# Patient Record
Sex: Female | Born: 1960 | Race: White | Hispanic: No | Marital: Single | Smoking: Former smoker
Health system: Southern US, Community
[De-identification: ages and names within clinical notes are randomized; demographics above are authoritative.]

## PROBLEM LIST (undated history)

## (undated) DIAGNOSIS — E872 Acidosis, unspecified: Secondary | ICD-10-CM

## (undated) DIAGNOSIS — G9340 Encephalopathy, unspecified: Secondary | ICD-10-CM

## (undated) DIAGNOSIS — J449 Chronic obstructive pulmonary disease, unspecified: Secondary | ICD-10-CM

## (undated) DIAGNOSIS — F329 Major depressive disorder, single episode, unspecified: Secondary | ICD-10-CM

## (undated) DIAGNOSIS — D701 Agranulocytosis secondary to cancer chemotherapy: Secondary | ICD-10-CM

## (undated) DIAGNOSIS — F1410 Cocaine abuse, uncomplicated: Secondary | ICD-10-CM

## (undated) DIAGNOSIS — F1010 Alcohol abuse, uncomplicated: Secondary | ICD-10-CM

## (undated) DIAGNOSIS — R0603 Acute respiratory distress: Secondary | ICD-10-CM

## (undated) DIAGNOSIS — D649 Anemia, unspecified: Secondary | ICD-10-CM

## (undated) DIAGNOSIS — L0291 Cutaneous abscess, unspecified: Secondary | ICD-10-CM

## (undated) DIAGNOSIS — I959 Hypotension, unspecified: Secondary | ICD-10-CM

## (undated) DIAGNOSIS — G8929 Other chronic pain: Secondary | ICD-10-CM

## (undated) DIAGNOSIS — C3490 Malignant neoplasm of unspecified part of unspecified bronchus or lung: Secondary | ICD-10-CM

## (undated) DIAGNOSIS — T451X5A Adverse effect of antineoplastic and immunosuppressive drugs, initial encounter: Secondary | ICD-10-CM

## (undated) DIAGNOSIS — F319 Bipolar disorder, unspecified: Secondary | ICD-10-CM

## (undated) DIAGNOSIS — N289 Disorder of kidney and ureter, unspecified: Secondary | ICD-10-CM

## (undated) DIAGNOSIS — E875 Hyperkalemia: Secondary | ICD-10-CM

## (undated) DIAGNOSIS — M549 Dorsalgia, unspecified: Secondary | ICD-10-CM

## (undated) DIAGNOSIS — R6521 Severe sepsis with septic shock: Secondary | ICD-10-CM

## (undated) DIAGNOSIS — A419 Sepsis, unspecified organism: Secondary | ICD-10-CM

## (undated) DIAGNOSIS — K859 Acute pancreatitis without necrosis or infection, unspecified: Secondary | ICD-10-CM

## (undated) HISTORY — PX: CHOLECYSTECTOMY: SHX55

## (undated) HISTORY — DX: Agranulocytosis secondary to cancer chemotherapy: D70.1

## (undated) HISTORY — DX: Adverse effect of antineoplastic and immunosuppressive drugs, initial encounter: T45.1X5A

## (undated) HISTORY — PX: APPENDECTOMY: SHX54

## (undated) HISTORY — DX: Chronic obstructive pulmonary disease, unspecified: J44.9

## (undated) HISTORY — PX: TONSILLECTOMY: SUR1361

---

## 1998-02-16 ENCOUNTER — Emergency Department (HOSPITAL_COMMUNITY): Admission: EM | Admit: 1998-02-16 | Discharge: 1998-02-16 | Payer: Self-pay | Admitting: Emergency Medicine

## 1998-06-02 ENCOUNTER — Emergency Department (HOSPITAL_COMMUNITY): Admission: EM | Admit: 1998-06-02 | Discharge: 1998-06-02 | Payer: Self-pay | Admitting: Emergency Medicine

## 1999-01-08 ENCOUNTER — Emergency Department (HOSPITAL_COMMUNITY): Admission: EM | Admit: 1999-01-08 | Discharge: 1999-01-08 | Payer: Self-pay | Admitting: Emergency Medicine

## 1999-03-18 ENCOUNTER — Emergency Department (HOSPITAL_COMMUNITY): Admission: EM | Admit: 1999-03-18 | Discharge: 1999-03-18 | Payer: Self-pay | Admitting: Internal Medicine

## 1999-09-07 ENCOUNTER — Encounter: Payer: Self-pay | Admitting: Emergency Medicine

## 1999-09-07 ENCOUNTER — Emergency Department (HOSPITAL_COMMUNITY): Admission: EM | Admit: 1999-09-07 | Discharge: 1999-09-07 | Payer: Self-pay | Admitting: Emergency Medicine

## 2000-03-27 ENCOUNTER — Emergency Department (HOSPITAL_COMMUNITY): Admission: EM | Admit: 2000-03-27 | Discharge: 2000-03-27 | Payer: Self-pay | Admitting: Emergency Medicine

## 2000-03-27 ENCOUNTER — Encounter: Payer: Self-pay | Admitting: Emergency Medicine

## 2001-03-06 ENCOUNTER — Emergency Department (HOSPITAL_COMMUNITY): Admission: EM | Admit: 2001-03-06 | Discharge: 2001-03-06 | Payer: Self-pay | Admitting: Emergency Medicine

## 2001-03-10 ENCOUNTER — Emergency Department (HOSPITAL_COMMUNITY): Admission: EM | Admit: 2001-03-10 | Discharge: 2001-03-10 | Payer: Self-pay | Admitting: Emergency Medicine

## 2002-10-20 ENCOUNTER — Emergency Department (HOSPITAL_COMMUNITY): Admission: EM | Admit: 2002-10-20 | Discharge: 2002-10-20 | Payer: Self-pay | Admitting: Emergency Medicine

## 2002-11-06 ENCOUNTER — Emergency Department (HOSPITAL_COMMUNITY): Admission: EM | Admit: 2002-11-06 | Discharge: 2002-11-06 | Payer: Self-pay

## 2002-11-12 ENCOUNTER — Emergency Department (HOSPITAL_COMMUNITY): Admission: EM | Admit: 2002-11-12 | Discharge: 2002-11-12 | Payer: Self-pay | Admitting: Emergency Medicine

## 2002-11-30 ENCOUNTER — Emergency Department (HOSPITAL_COMMUNITY): Admission: EM | Admit: 2002-11-30 | Discharge: 2002-12-01 | Payer: Self-pay | Admitting: Emergency Medicine

## 2002-12-10 ENCOUNTER — Emergency Department (HOSPITAL_COMMUNITY): Admission: EM | Admit: 2002-12-10 | Discharge: 2002-12-10 | Payer: Self-pay | Admitting: Emergency Medicine

## 2003-01-15 ENCOUNTER — Emergency Department (HOSPITAL_COMMUNITY): Admission: EM | Admit: 2003-01-15 | Discharge: 2003-01-15 | Payer: Self-pay | Admitting: Emergency Medicine

## 2003-01-25 ENCOUNTER — Emergency Department (HOSPITAL_COMMUNITY): Admission: EM | Admit: 2003-01-25 | Discharge: 2003-01-25 | Payer: Self-pay | Admitting: Emergency Medicine

## 2003-03-30 ENCOUNTER — Ambulatory Visit (HOSPITAL_COMMUNITY): Admission: RE | Admit: 2003-03-30 | Discharge: 2003-03-30 | Payer: Self-pay | Admitting: Family Medicine

## 2003-03-30 ENCOUNTER — Encounter: Payer: Self-pay | Admitting: Family Medicine

## 2003-05-08 ENCOUNTER — Emergency Department (HOSPITAL_COMMUNITY): Admission: EM | Admit: 2003-05-08 | Discharge: 2003-05-08 | Payer: Self-pay | Admitting: Emergency Medicine

## 2003-06-06 ENCOUNTER — Emergency Department (HOSPITAL_COMMUNITY): Admission: AD | Admit: 2003-06-06 | Discharge: 2003-06-06 | Payer: Self-pay | Admitting: Family Medicine

## 2003-06-21 ENCOUNTER — Emergency Department (HOSPITAL_COMMUNITY): Admission: EM | Admit: 2003-06-21 | Discharge: 2003-06-21 | Payer: Self-pay | Admitting: Emergency Medicine

## 2003-07-13 ENCOUNTER — Encounter: Payer: Self-pay | Admitting: Emergency Medicine

## 2003-07-13 ENCOUNTER — Inpatient Hospital Stay (HOSPITAL_COMMUNITY): Admission: EM | Admit: 2003-07-13 | Discharge: 2003-07-20 | Payer: Self-pay | Admitting: Psychiatry

## 2003-08-03 ENCOUNTER — Emergency Department (HOSPITAL_COMMUNITY): Admission: EM | Admit: 2003-08-03 | Discharge: 2003-08-03 | Payer: Self-pay | Admitting: Emergency Medicine

## 2003-09-19 ENCOUNTER — Emergency Department (HOSPITAL_COMMUNITY): Admission: EM | Admit: 2003-09-19 | Discharge: 2003-09-19 | Payer: Self-pay | Admitting: *Deleted

## 2003-10-06 ENCOUNTER — Emergency Department (HOSPITAL_COMMUNITY): Admission: EM | Admit: 2003-10-06 | Discharge: 2003-10-06 | Payer: Self-pay | Admitting: Emergency Medicine

## 2003-12-06 ENCOUNTER — Ambulatory Visit (HOSPITAL_COMMUNITY): Admission: RE | Admit: 2003-12-06 | Discharge: 2003-12-06 | Payer: Self-pay | Admitting: Internal Medicine

## 2003-12-07 ENCOUNTER — Emergency Department (HOSPITAL_COMMUNITY): Admission: EM | Admit: 2003-12-07 | Discharge: 2003-12-07 | Payer: Self-pay | Admitting: Emergency Medicine

## 2003-12-25 ENCOUNTER — Emergency Department (HOSPITAL_COMMUNITY): Admission: EM | Admit: 2003-12-25 | Discharge: 2003-12-25 | Payer: Self-pay | Admitting: Emergency Medicine

## 2003-12-27 ENCOUNTER — Emergency Department (HOSPITAL_COMMUNITY): Admission: EM | Admit: 2003-12-27 | Discharge: 2003-12-27 | Payer: Self-pay | Admitting: Family Medicine

## 2004-03-09 ENCOUNTER — Inpatient Hospital Stay (HOSPITAL_COMMUNITY): Admission: AD | Admit: 2004-03-09 | Discharge: 2004-03-20 | Payer: Self-pay | Admitting: Psychiatry

## 2004-04-23 ENCOUNTER — Ambulatory Visit (HOSPITAL_COMMUNITY): Admission: RE | Admit: 2004-04-23 | Discharge: 2004-04-23 | Payer: Self-pay | Admitting: Internal Medicine

## 2004-06-04 ENCOUNTER — Emergency Department (HOSPITAL_COMMUNITY): Admission: EM | Admit: 2004-06-04 | Discharge: 2004-06-05 | Payer: Self-pay | Admitting: *Deleted

## 2004-06-23 ENCOUNTER — Inpatient Hospital Stay (HOSPITAL_COMMUNITY): Admission: EM | Admit: 2004-06-23 | Discharge: 2004-07-27 | Payer: Self-pay | Admitting: Emergency Medicine

## 2004-06-23 ENCOUNTER — Ambulatory Visit: Payer: Self-pay | Admitting: Critical Care Medicine

## 2004-06-27 ENCOUNTER — Encounter: Payer: Self-pay | Admitting: Cardiology

## 2005-02-20 ENCOUNTER — Encounter: Admission: RE | Admit: 2005-02-20 | Discharge: 2005-02-20 | Payer: Self-pay | Admitting: Internal Medicine

## 2005-03-10 ENCOUNTER — Encounter: Admission: RE | Admit: 2005-03-10 | Discharge: 2005-03-10 | Payer: Self-pay | Admitting: Internal Medicine

## 2005-09-29 ENCOUNTER — Ambulatory Visit (HOSPITAL_COMMUNITY): Admission: RE | Admit: 2005-09-29 | Discharge: 2005-09-29 | Payer: Self-pay | Admitting: Family Medicine

## 2006-02-27 ENCOUNTER — Emergency Department (HOSPITAL_COMMUNITY): Admission: EM | Admit: 2006-02-27 | Discharge: 2006-02-27 | Payer: Self-pay | Admitting: Emergency Medicine

## 2006-11-16 ENCOUNTER — Emergency Department (HOSPITAL_COMMUNITY): Admission: EM | Admit: 2006-11-16 | Discharge: 2006-11-16 | Payer: Self-pay | Admitting: *Deleted

## 2007-01-15 ENCOUNTER — Emergency Department (HOSPITAL_COMMUNITY): Admission: EM | Admit: 2007-01-15 | Discharge: 2007-01-15 | Payer: Self-pay | Admitting: Emergency Medicine

## 2007-10-24 ENCOUNTER — Emergency Department (HOSPITAL_COMMUNITY): Admission: EM | Admit: 2007-10-24 | Discharge: 2007-10-24 | Payer: Self-pay | Admitting: Emergency Medicine

## 2007-10-26 ENCOUNTER — Emergency Department (HOSPITAL_COMMUNITY): Admission: EM | Admit: 2007-10-26 | Discharge: 2007-10-26 | Payer: Self-pay | Admitting: Emergency Medicine

## 2007-12-13 ENCOUNTER — Emergency Department (HOSPITAL_COMMUNITY): Admission: EM | Admit: 2007-12-13 | Discharge: 2007-12-14 | Payer: Self-pay | Admitting: Emergency Medicine

## 2008-02-24 ENCOUNTER — Emergency Department (HOSPITAL_COMMUNITY): Admission: EM | Admit: 2008-02-24 | Discharge: 2008-02-25 | Payer: Self-pay | Admitting: Emergency Medicine

## 2009-04-04 ENCOUNTER — Emergency Department (HOSPITAL_COMMUNITY): Admission: EM | Admit: 2009-04-04 | Discharge: 2009-04-04 | Payer: Self-pay | Admitting: Emergency Medicine

## 2009-05-02 ENCOUNTER — Emergency Department (HOSPITAL_COMMUNITY): Admission: EM | Admit: 2009-05-02 | Discharge: 2009-05-02 | Payer: Self-pay | Admitting: Emergency Medicine

## 2009-11-11 ENCOUNTER — Emergency Department (HOSPITAL_COMMUNITY): Admission: EM | Admit: 2009-11-11 | Discharge: 2009-11-11 | Payer: Self-pay | Admitting: Emergency Medicine

## 2010-05-06 ENCOUNTER — Emergency Department (HOSPITAL_COMMUNITY): Admission: EM | Admit: 2010-05-06 | Discharge: 2010-05-06 | Payer: Self-pay | Admitting: Emergency Medicine

## 2010-06-25 ENCOUNTER — Emergency Department (HOSPITAL_COMMUNITY): Admission: EM | Admit: 2010-06-25 | Discharge: 2010-06-25 | Payer: Self-pay | Admitting: Emergency Medicine

## 2010-07-24 ENCOUNTER — Emergency Department (HOSPITAL_COMMUNITY)
Admission: EM | Admit: 2010-07-24 | Discharge: 2010-07-24 | Payer: Self-pay | Source: Home / Self Care | Admitting: Emergency Medicine

## 2010-08-31 ENCOUNTER — Encounter: Payer: Self-pay | Admitting: Internal Medicine

## 2010-09-01 ENCOUNTER — Encounter: Payer: Self-pay | Admitting: Internal Medicine

## 2010-09-18 ENCOUNTER — Encounter: Payer: Self-pay | Admitting: Obstetrics and Gynecology

## 2010-10-13 ENCOUNTER — Emergency Department (HOSPITAL_COMMUNITY): Payer: Medicaid Other

## 2010-10-13 ENCOUNTER — Emergency Department (HOSPITAL_COMMUNITY)
Admission: EM | Admit: 2010-10-13 | Discharge: 2010-10-13 | Disposition: A | Discharge status: Home / Self Care | Payer: Medicaid Other | Attending: Emergency Medicine | Admitting: Emergency Medicine

## 2010-10-13 DIAGNOSIS — N390 Urinary tract infection, site not specified: Secondary | ICD-10-CM | POA: Insufficient documentation

## 2010-10-13 DIAGNOSIS — R300 Dysuria: Secondary | ICD-10-CM | POA: Insufficient documentation

## 2010-10-13 DIAGNOSIS — B373 Candidiasis of vulva and vagina: Secondary | ICD-10-CM | POA: Insufficient documentation

## 2010-10-13 DIAGNOSIS — Z794 Long term (current) use of insulin: Secondary | ICD-10-CM | POA: Insufficient documentation

## 2010-10-13 DIAGNOSIS — N898 Other specified noninflammatory disorders of vagina: Secondary | ICD-10-CM | POA: Insufficient documentation

## 2010-10-13 DIAGNOSIS — L2989 Other pruritus: Secondary | ICD-10-CM | POA: Insufficient documentation

## 2010-10-13 DIAGNOSIS — Z7982 Long term (current) use of aspirin: Secondary | ICD-10-CM | POA: Insufficient documentation

## 2010-10-13 DIAGNOSIS — R109 Unspecified abdominal pain: Secondary | ICD-10-CM | POA: Insufficient documentation

## 2010-10-13 DIAGNOSIS — L298 Other pruritus: Secondary | ICD-10-CM | POA: Insufficient documentation

## 2010-10-13 DIAGNOSIS — Z79899 Other long term (current) drug therapy: Secondary | ICD-10-CM | POA: Insufficient documentation

## 2010-10-13 DIAGNOSIS — B3731 Acute candidiasis of vulva and vagina: Secondary | ICD-10-CM | POA: Insufficient documentation

## 2010-10-13 DIAGNOSIS — F319 Bipolar disorder, unspecified: Secondary | ICD-10-CM | POA: Insufficient documentation

## 2010-10-13 DIAGNOSIS — R21 Rash and other nonspecific skin eruption: Secondary | ICD-10-CM | POA: Insufficient documentation

## 2010-10-13 DIAGNOSIS — N739 Female pelvic inflammatory disease, unspecified: Secondary | ICD-10-CM | POA: Insufficient documentation

## 2010-10-13 DIAGNOSIS — N949 Unspecified condition associated with female genital organs and menstrual cycle: Secondary | ICD-10-CM | POA: Insufficient documentation

## 2010-10-13 DIAGNOSIS — A5901 Trichomonal vulvovaginitis: Secondary | ICD-10-CM | POA: Insufficient documentation

## 2010-10-13 LAB — URINALYSIS, ROUTINE W REFLEX MICROSCOPIC
Bilirubin Urine: NEGATIVE
Nitrite: NEGATIVE
Specific Gravity, Urine: 1.022 (ref 1.005–1.030)
Urobilinogen, UA: 0.2 mg/dL (ref 0.0–1.0)
pH: 6.5 (ref 5.0–8.0)

## 2010-10-13 LAB — WET PREP, GENITAL: Clue Cells Wet Prep HPF POC: NONE SEEN

## 2010-10-13 LAB — URINE MICROSCOPIC-ADD ON

## 2010-10-14 LAB — GC/CHLAMYDIA PROBE AMP, GENITAL: GC Probe Amp, Genital: NEGATIVE

## 2010-10-22 LAB — DIFFERENTIAL
Basophils Absolute: 0 10*3/uL (ref 0.0–0.1)
Basophils Relative: 0 % (ref 0–1)
Eosinophils Relative: 1 % (ref 0–5)
Monocytes Absolute: 0.4 10*3/uL (ref 0.1–1.0)
Neutro Abs: 4.9 10*3/uL (ref 1.7–7.7)

## 2010-10-22 LAB — COMPREHENSIVE METABOLIC PANEL
AST: 27 U/L (ref 0–37)
Albumin: 3.9 g/dL (ref 3.5–5.2)
Alkaline Phosphatase: 74 U/L (ref 39–117)
BUN: 12 mg/dL (ref 6–23)
CO2: 30 mEq/L (ref 19–32)
Chloride: 99 mEq/L (ref 96–112)
Potassium: 4.6 mEq/L (ref 3.5–5.1)
Total Bilirubin: 0.3 mg/dL (ref 0.3–1.2)

## 2010-10-22 LAB — CBC
Hemoglobin: 14.2 g/dL (ref 12.0–15.0)
MCV: 97 fL (ref 78.0–100.0)
Platelets: 239 10*3/uL (ref 150–400)
RBC: 4.38 MIL/uL (ref 3.87–5.11)
WBC: 8 10*3/uL (ref 4.0–10.5)

## 2010-10-22 LAB — URINALYSIS, ROUTINE W REFLEX MICROSCOPIC
Glucose, UA: NEGATIVE mg/dL
Hgb urine dipstick: NEGATIVE
Specific Gravity, Urine: 1.016 (ref 1.005–1.030)

## 2010-10-23 LAB — DIFFERENTIAL
Basophils Absolute: 0 K/uL (ref 0.0–0.1)
Basophils Relative: 0 % (ref 0–1)
Eosinophils Absolute: 0.1 K/uL (ref 0.0–0.7)
Eosinophils Relative: 1 % (ref 0–5)
Lymphocytes Relative: 33 % (ref 12–46)
Lymphs Abs: 3.6 K/uL (ref 0.7–4.0)
Monocytes Absolute: 0.6 K/uL (ref 0.1–1.0)
Monocytes Relative: 6 % (ref 3–12)
Neutro Abs: 6.5 K/uL (ref 1.7–7.7)
Neutrophils Relative %: 60 % (ref 43–77)

## 2010-10-23 LAB — BASIC METABOLIC PANEL WITH GFR
BUN: 8 mg/dL (ref 6–23)
CO2: 25 meq/L (ref 19–32)
Calcium: 9.5 mg/dL (ref 8.4–10.5)
Chloride: 99 meq/L (ref 96–112)
Creatinine, Ser: 0.7 mg/dL (ref 0.4–1.2)
GFR calc non Af Amer: >60 mL/min
Glucose, Bld: 115 mg/dL — ABNORMAL HIGH (ref 70–99)
Potassium: 4 meq/L (ref 3.5–5.1)
Sodium: 139 meq/L (ref 135–145)

## 2010-10-23 LAB — VALPROIC ACID LEVEL: Valproic Acid Lvl: 34.1 ug/mL — ABNORMAL LOW (ref 50.0–100.0)

## 2010-10-23 LAB — CBC
HCT: 40.7 % (ref 36.0–46.0)
Hemoglobin: 14.2 g/dL (ref 12.0–15.0)
MCH: 33.9 pg (ref 26.0–34.0)
MCHC: 34.9 g/dL (ref 30.0–36.0)
MCV: 97.1 fL (ref 78.0–100.0)
Platelets: 214 K/uL (ref 150–400)
RBC: 4.19 MIL/uL (ref 3.87–5.11)
RDW: 13.5 % (ref 11.5–15.5)
WBC: 10.8 K/uL — ABNORMAL HIGH (ref 4.0–10.5)

## 2010-10-23 LAB — ACETAMINOPHEN LEVEL

## 2010-10-23 LAB — SALICYLATE LEVEL: Salicylate Lvl: <4 mg/dL (ref 2.8–20.0)

## 2010-10-30 LAB — GLUCOSE, CAPILLARY: Glucose-Capillary: 138 mg/dL — ABNORMAL HIGH (ref 70–99)

## 2010-12-27 NOTE — H&P (Signed)
NAME:  Sarah Leonard, Sarah Leonard                       ACCOUNT NO.:  [id number]   MEDICAL RECORD NO.:  [id number]                   PATIENT TYPE:  IPS   LOCATION:  0504                                 [facility]ACILITY[facility]   PHYSICIAN:  Jeanice Lim, M.D.              DATE OF BIRTH:  [birth year]   DATE OF ADMISSION:  07/13/2003  DATE OF DISCHARGE:                         PSYCHIATRIC ADMISSION ASSESSMENT   IDENTIFYING INFORMATION:  This is a 50 year old divorced white female who is  a voluntary admission.  Chief complaint:  I've screwed everything up.   HISTORY OF PRESENT ILLNESS:  This is a divorced white female with a history  of polysubstance abuse since age 13, reporting that she relapsed on cocaine  approximately 2-3 days ago after being sober for approximately 75 days.  During the time period of the relapse, the patient reports that her family  found that things were missing from her parents' home where the patient has  been living for the past 2 weeks.  Now the family says that she must leave  the home.  She is now homeless.  She stayed 1 night in a hotel and then  presented herself yesterday to the emergency room, complaining of feeling  hopeless, with suicidal thoughts, with thoughts of cutting herself with a  knife, and she does have a history of prior overdose.  The patient endorses  hopelessness, suicidal ideation all for the past week, denies any homicidal  ideation, denies auditory or visual hallucinations.  She says she has not  been taking her medications for her bipolar illness for at least 2-3 weeks.   PAST PSYCHIATRIC HISTORY:  The patient has a diagnosis of bipolar disorder.  This is her [facility] admission to [facility].  She has  a history of multiple admissions to [facility], with the last one  being in August of 2004, and has a history of prior suicide attempt by  overdose.   SOCIAL HISTORY:  This is a female who has been divorced 6 years  from her  first marriage, currently single but has a relationship with a female friend  who she says is now quite angry with her and also feels that she has ruined  the relationship.  She has 43 4 year old son who is living in a group home  in [place], who she gave up to the [employer] several  years ago because of her drug use.  The patient has been living in and out  of a homeless shelter and was in a homeless shelter prior to moving back  into her parents' home 2 weeks ago.  She had been living there 2 weeks,  until her sister called her and told her that she had to leave the home.   FAMILY HISTORY:  Unclear.   ALCOHOL AND DRUG HISTORY:  The patient reports abusing various types of  substances since age 85, with her  longest period being clean and sober 1  year.  She started abusing alcohol at age 1, followed by pain pills at  various times and did not begin to use cocaine until she was 50 years old.  She denies any recent relapse on alcohol or pills but has had frequent  relapses on cocaine since she started using it around age 42.   PAST MEDICAL HISTORY:  The patient is followed by [facility].  Medical problems are sinus congestion which is chronic versus getting a bit  acutely worse after smoking crack this week.  The patient also has a history  of migraine headaches.  Past medical history is remarkable for history of  appendectomy, cholecystectomy, a C-section when her son was born, and a  tonsillectomy and adenoidectomy.  She denies any past history of seizures.   MEDICATIONS:  Risperdal 3 mg p.o. q.h.s., Depakote dose unclear, Paxil dose  unclear.   DRUG ALLERGIES:  None.   POSITIVE PHYSICAL FINDINGS:  Please see the physical examination that was  done in the emergency room.  No changes are noted today.  This is an obese,  short-statured female, weight 207 pounds, 5 feet 3-1/2 inches tall. She is  afebrile with pulse 88 and regular apically.   Respirations are clear, blood  pressure 131/75.  She does have considerable nasal congestion, without  facial tenderness or headache at this point.   MENTAL STATUS EXAM:  This is a fully alert female with a tearful affect who  is also quite irritable both in affect and in manner, some thought agitation  is present, and some mild psychomotor agitation.  Speech is within normal  limits in pace and tone.  Mood is depressed, irritable.  Thought process is  remarkable for some mild agitation, no clear auditory or visual  hallucinations at this time, positive for suicidal thoughts, with thoughts  of cutting on herself.  No flight of ideas noted, no ideas of reference, no  paranoia.  No clear homicidal ideation.  Cognitively intact and oriented x3.  Insight poor, judgment poor, impulse control poor, intellect average.   ADMISSION DIAGNOSIS:   AXIS I:  1. 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  2. Cocaine abuse.  3. Polysubstance abuse in partial remission.   AXIS II:  Deferred.   AXIS III:  Acute rhinosinusitis not otherwise specified.   AXIS IV:  Severe problems with homelessness and lack of adequate support.   AXIS V:  Current 32, past year 54.   INITIAL PLAN OF CARE:  Voluntarily admit the patient with q.15 minute checks  in place.  We have chosen to start her back on Risperdal 3 mg h.s.  We will  also add a 0.5 mg M-tab b.i.d. for agitation, and Depakote 500 mg q.a.m. and  500 mg q.h.s., Paxil CR 12.5 mg p.o. q.a.m. and we will also check a  hemoglobin A1C, a lipid panel and a valporic acid level on December 4.   ESTIMATED LENGTH OF STAY:  5 days.     Margaret A. Stephannie Peters                   Jeanice Lim, M.D.    MAS/MEDQ  D:  07/14/2003  T:  07/14/2003  Job:  [id number]

## 2010-12-27 NOTE — Discharge Summary (Signed)
NAMEALDEA, Sarah Leonard             ACCOUNT NO.:  [id number]   MEDICAL RECORD NO.:  [id number]          PATIENT TYPE:  INP   LOCATION:  4737                         FACILITY:  [facility]   PHYSICIAN:  Mallory Shirk, MD     DATE OF BIRTH:  17-Jul-1961   DATE OF ADMISSION:  06/23/2004  DATE OF DISCHARGE:                                 DISCHARGE SUMMARY   LABORATORY DATA:  On admission potassium 3.0, chloride 89, carbon dioxide  22, glucose 172, BUN 15, creatinine 1, calcium 9.6, total protein 7.8,  albumin 4.6, AST 46, ALT 37, alk phos 74, total bilirubin 1.2, lipase 333,  WBC 18.2, hemoglobin 16, hematocrit 44.8, MCV 94.1, platelet count 436.   HOSPITAL COURSE:  The patient was admitted to the floor.   1.  Acute pancreatitis.  The patient's lipase was 333 with unclear etiology.      The patient denies any alcohol use.  However, the patient has a history      of alcohol and cocaine abuse.  The patient was kept NPO except      medications.  She was also given IV Demerol and Phenergan for management      of pain and nausea and vomiting.  The patient's pancreatitis was      resolving slowly, on 06/26/04 her lipase was 51, her abdominal pain had      essentially resolved.  The plan was to start clear liquids at that time,      however, on 06/26/04 at about 8:00 p.m., the patient was confused,      disoriented, and went into respiratory failure.  She was intubated.      ABGs at that time showed a pH of 7.48, pCO2 of 41, pO2 40, bicarb 31.1.      The patient was transferred to critical care with vent dependent      respiratory failure secondary to acute lung injury / severe SIRS.  The      patient continued to be on the vent and was a difficult wean.  On      07/05/04, the patient underwent a tracheostomy.  A number 7 ET tube was      placed.  The patient was treated with Zosyn and vancomycin for infection      control.  Blood cultures from 07/11/04, 06/27/04, and 07/14/04 were      negative.   Urine culture on 06/26/04 was also negative.  Culture from      the respiratory secretions showed gram positive cocci in pairs, a few      staph aureus determined to be oxacillin sensitive.  Repeat urine      cultures on 07/14/04 showed diphtheroids, likely contamination.  C. diff      toxin was negative.  The patient continued to do well with her      tracheostomy.  On 07/20/04, her trache was removed.  The patient was      sating well.  Her O2 sats on room air were 97%.  The patient remained      ambulatory and had no respiratory complaints as  of 07/20/04.  Her vitals      were stable at that time.  2.  Diabetes mellitus.  The patient was managed with sliding scale insulin      and Lantus.  Her blood sugars were well controlled during the hospital      stay.  Hemoglobin A1c on 06/24/04 was 6.0.  3.  Chronic low back pain.  The patient's back pain was managed with Tylenol      and Demerol.  4.  Psychiatric issues.  The patient was seen by Dr. Antonietta Breach and      followed during her hospital course.  Her medications were adjusted by      Dr. Jeanie Sewer.  On 07/20/04, the patient's main issue was psychiatric.      She was combative, walking around the halls threatening to beat up      anybody in sight, wanted to go home without her IV.  CARDh had to be      called and patient restrained.  One on one sitter was provided during      this time.  Dr. Providence Crosby input is most appreciated.  Her psychiatric      medications continued to be tuned up.  She may need inpatient psych      hospitalization.  Dr. Jeanie Sewer is looking into these possibilities.   The patient will need to be followed very closely by outpatient psychiatry,  since that seems to be her main issues at this time.  It is unclear whether  her substance abuse will become a problem since she denies use of alcohol or  drugs for quite some time prior to admission.       GDK/MEDQ  D:  07/21/2004  T:  07/22/2004  Job:   [id number]

## 2010-12-27 NOTE — H&P (Signed)
NAMEMarland Kitchen  Sarah, Leonard                       ACCOUNT NO.:  [id number]   MEDICAL RECORD NO.:  [id number]                   PATIENT TYPE:  IPS   LOCATION:  0307                                 [facility]ACILITY[facility]  [facility]   PHYSICIAN:  Jeanice Lim, M.D.              DATE OF BIRTH:  03-Jul-1961   DATE OF ADMISSION:  03/09/2004  DATE OF DISCHARGE:                         PSYCHIATRIC ADMISSION ASSESSMENT   IDENTIFYING INFORMATION:  This is a 50 year old divorced white female.  The  patient called 911 yesterday so she could get off cocaine.  She relapsed  about a month ago.  She reported suicidal ideation with a plan to cut her  wrists, although she denies having suicidal ideation.  She has been here  previously, specifically March of 2004 when she overdosed.  She has also  been to [facility] in March of 2004 with a nervous breakdown.  She states at that time she was properly diagnosed with bipolar disorder.  Currently she has been noncompliant for the past few weeks.  She states that  she had increasingly felt depressed, sad and alone.  Some old friends came  by early in July and got her restarted on cocaine.   She currently gets her prescriptions filled at [facility] [employer] [facility],  telephone [phone].  They are not open at this time and I do not have  prescribed dosages available.   PAST PSYCHIATRIC HISTORY:  The patient states that she was admitted to [facility] in March of 2004.  She has also been here at [facility], the last time being in December.  She also acknowledges  several other psych admissions for substance abuse at various and sundry  places, mostly for detox.  She is currently followed as an outpatient at  [facility].  She is under the care of Dr. Allyne Gee.   SOCIAL HISTORY:  She has 1 son age 31 who is in a group home.  She finished  high school.  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   FAMILY HISTORY:  She states her son is bipolar and she has a sister who  takes Paxil but she is unaware of any other family members who are  bipolar.   ALCOHOL AND DRUG ABUSE:  She began using alcohol at age 24.  She says that  she quit using marijuana last summer.  She states that she has used a  variety of substances throughout her life and that she was clean and sober  from December to June when she relapsed on powder cocaine.   PAST MEDICAL HISTORY:  Primary care Taren Toops:  She is followed by  [facility].  Medical problems:  She is known to have migraines.  She has  had them since age 89.  Medications:  She is prescribed Depakote, Risperdal,  and Paxil.  The dosages that were redone  last night are Depakote ER 1000 mg  at h.s., Risperdal M-tab 1 mg at h.s. and Paxil 20 mg q.a.m.   ALLERGIES:  No known drug allergies, although she states that Neurontin  upsets her stomach and hence she does not take that.   POSITIVE PHYSICAL FINDINGS:  This is an obese white female who appears  uncomfortable.  HEENT:  Revealed no abnormal findings.  LUNGS:  Clear.  HEART:  Regular rate and rhythm without murmurs, rubs or gallops.  ABDOMEN:  Obese but there is no fluid wave, no palpable mass, megaly or  tenderness.  MUSCULOSKELETAL SYSTEM:  Reveals no clubbing, cyanosis, or edema.  NEUROLOGICALLY:  Cranial nerves II-XII are grossly intact.  She is status post a cholecystectomy in 1983, an appendectomy in 1983, and a  C-section in 1990.   MENTAL STATUS EXAM:  She is alert and oriented x3.  Her grooming and dress  are appropriate.  She ambulates without assistance and her gait is normal.  She has poor eye contact.  Her speech is not pressured.  Her mood is  depressed and anxious.  Her affect is congruent.  Her thought processes were  clear, they are goal oriented.  They are linear.  She would like to feel  better.  There were no delusions or paranoia, and she denies any auditory  or  visual hallucinations.  Concentration and memory are intact.  Judgment and  insight are poor.  Intelligence is average.  She is currently denying  thoughts of harm to herself or to others.   ADMISSION DIAGNOSES:   AXIS I:  1. Bipolar, depressed.  2. Cocaine abuse.  3. Noncompliance with prescribed medications.   AXIS II:  Rule out personality disorder not otherwise specified.   AXIS III:  Migraines and obesity.   AXIS IV:  Moderate.  Problems with primary support group, occupational  problem and economic problem   AXIS V:  Global assessment of function is 30.   PLAN:  Admit for support through detoxification and withdrawal from cocaine,  to reestablish compliance with her prescribed medications and to adjust as  indicated to lift her mood.   ESTIMATED LENGTH OF STAY:  5-6 days, and long-term substance abuse treatment  may need to be considered.     Vic Ripper, P.A.-C.               Jeanice Lim, M.D.    MD/MEDQ  D:  03/10/2004  T:  03/10/2004  Job:  [id number]

## 2010-12-27 NOTE — Discharge Summary (Signed)
NAMELAVINIA, Sarah Leonard             ACCOUNT NO.:  [id number]   MEDICAL RECORD NO.:  [id number]          PATIENT TYPE:  INP   LOCATION:  4737                         FACILITY:  [facility]   PHYSICIAN:  Mallory Shirk, MD     DATE OF BIRTH:  10/31/1960   DATE OF ADMISSION:  06/23/2004  DATE OF DISCHARGE:                                 DISCHARGE SUMMARY   ANTICIPATED DATE OF DISCHARGE:  To be determined.   DISCHARGE DIAGNOSES:  1.  Acute pancreatitis.  2.  Ventilatory-dependent respiratory failure.  3.  Chronic low-back pain.  4.  Diabetes mellitus.  5.  Bipolar disorder.  6.  Depression.   PRIMARY CARE PHYSICIAN:  Dr. Lovell Sheehan.   DISCHARGE MEDICATIONS:  The discharge medications are to be determined.   HISTORY OF THE PRESENT ILLNESS:  Sarah Leonard is a 50 year old Caucasian  woman with a history of alcohol and cocaine abuse presents to the emergency  department on June 23, 2004 with the chief complaint of nausea, vomiting  and abdominal pain, which started about the even prior to admission right  after eating at a fast food restaurant.  Subsequently her abdominal pain got  worse, she started vomiting with vomitus being mainly food, but also  bilious.  The patient has diffuse abdominal pain, mainly epigastric, but  also in the left upper quadrant radiating to the back.  The patient has a  history of chronic back pain in addition to morbid obesity.   The patient was seen in the [facility] ED on June 04, 2004 with decreased  abdominal pain and discharged with Vicodin.  The patient does not complain  of fever, chills or diaphoresis at the time of admission.   PAST MEDICAL HISTORY:  1.  Alcohol abuse.  2.  Cocaine abuse.  3.  Bipolar disorder.  4.  Depression.  5.  Diabetes mellitus.  6.  Chronic back pain.   PHYSICAL EXAMINATION:  Physical exam on admission:  VITAL SIGNS:  Blood pressure 126/80, pulse 104, temperature 98.6 and  respirations 26.  GENERAL APPEARANCE:  A  middle-aged obese Caucasian woman lying in bed in  moderate distress.  Alert and oriented times three.  Cooperative with the  exam.  She is able to walk to the restroom and back unassisted.  HEENT:  Normocephalic and atraumatic.  PERRL.  Extraocular muscles intact.  NECK:  Lymphadenopathy.  No JVD.  Supple.  LUNGS:  The lungs are clear to auscultation bilaterally.  No wheezes.  No  rales.  CARDIOVASCULAR:  S1 and S2. Tachycardic.  No murmurs, rubs or gallops.  ABDOMEN:  Positive bowel sounds.  Soft.  Exam inconsistent with patient's  complaint of pain.  The patient has exquisite tenderness in the left upper  quadrant and epigastric area; however, auscultation with deep pressure did  not elicit the same pain.  No organomegaly felt even though the patient's  body habitus makes this exam difficult.  EXTREMITIES:  Two plus pitting edema in the lower extremities.  NEUROLOGIC EXAMINATION:  No focal neurological deficits seen.   LABORATORY DATA:  Imaging:  Chest x-ray showed no  active disease.  Abdominal  x-ray showed nonspecific mild gas pattern.   Laboratory data on admission:  Sodium 132, potassium 3.   Dictation ended at this point.       GDK/MEDQ  D:  07/21/2004  T:  07/22/2004  Job:  [id number]   cc:   Dr. Lovell Sheehan

## 2010-12-27 NOTE — Op Note (Signed)
NAMEABIE, KILLIAN             ACCOUNT NO.:  [id number]   MEDICAL RECORD NO.:  [id number]          PATIENT TYPE:  INP   LOCATION:  2101                         FACILITY:  [facility]   PHYSICIAN:  Jimmye Norman III, M.D.  DATE OF BIRTH:  1961-01-02   DATE OF PROCEDURE:  DATE OF DISCHARGE:                                 OPERATIVE REPORT   PREOPERATIVE DIAGNOSIS:  Ventilator-dependent respiratory failure.   POSTOPERATIVE DIAGNOSIS:  Ventilator-dependent respiratory failure.   PROCEDURE:  Tracheostomy with #7 long Shiley tracheostomy tube.   SURGEON:  Jimmye Norman, M.D.   ASSISTANT:  An R.N.F.A. by the name of Albin Felling.   ANESTHESIA:  General endotracheal.   ESTIMATED BLOOD LOSS:  Less than 20 mL.   COMPLICATIONS:  None.   CONDITION:  Stable.   INDICATION FOR OPERATION:  The patient is a 50 year old with a ventilator-  dependent respiratory failure from pancreatitis and sepsis, who now comes  into the OR for a trach.   OPERATION:  The patient was taken to the operating room and placed on the  table in supine position.  After an adequate endotracheal anesthetic had  been continued, she was on the vent from the ICU, she was prepped with a  towel rolled under her shoulders, the head in hyperextension.   We prepped with Betadine, then made a transverse incision about 3 cm long  with a #15 blade.  We dissected down carefully to the midline trachea, which  was very deep in the patient's neck, extending deeply at about 3-4 cm.  We  were able to palpate the tracheal rings and subsequently isolate them with  careful electrocautery and blunt dissection.  With this being done, we were  able to isolate the second tracheal ring.  A tracheal hook was placed on the  first tracheal ring, and we placed stay sutures around the second tracheal  ring.  We then made an inferior tracheotomy with a #11 blade, opened it up  with a hemostat clamp and endotracheal spreader.  At this point the  endotracheal  tube was pulled back just proximal to the tracheotomy.  We  passed a #7 Shiley catheter into the tracheotomy and connected it to the  ventilator; however, the initial pass failed to show significant carbon  dioxide return.  We subsequently re-passed the ET tube back down to its  proper position and positioned the patient and resuscitated her back to an  adequate oxygen level.  Once this was done, we did pass the #7 tracheotomy  tube back into position and found it to be in adequate position.  There was  good CO2 return. The patient oxygenated fairly well, and then we connected  her to the ventilator.   We secured the tracheostomy tube in place with four corner stitches of 3-0  nylon.  A tracheal dressing was applied, and the patient was attached to a  Velcro tracheal holder and taken to the ICU in stable condition.       JW/MEDQ  D:  07/05/2004  T:  07/05/2004  Job:  [id number]

## 2010-12-27 NOTE — Consult Note (Signed)
Sarah Leonard, HORNSTEIN             ACCOUNT NO.:  [id number]   MEDICAL RECORD NO.:  [id number]          PATIENT TYPE:  INP   LOCATION:  2101                         FACILITY:  [facility]   PHYSICIAN:  Jimmye Norman III, M.D.  DATE OF BIRTH:  [birth year]   DATE OF CONSULTATION:  06/27/2004  DATE OF DISCHARGE:                                   CONSULTATION   Dear Dr. Delford Field:   Thank you for asking me to see [name], a 50 year old young woman who  unfortunately has developed severe pancreatitis likely from polysubstance  abuse, particularly alcohol.  She is now ventilator dependent on the  respiratory for acute lung injury and adult respiratory distress syndrome  but tolerating her current settings well.  Because of her pancreatitis, I  was asked to see her and also the development of a pancreatic pseudocyst on  CT scan.   PAST MEDICAL HISTORY:  1.  Alcohol abuse.  2.  Cocaine abuse.  3.  Bipolar disorder.  4.  Depression.  5.  History of diabetes.  6.  Chronic back pain.   She was admitted on June 23, 2004, with what was felt to be acute  pancreatitis with an elevated lipase level in spite of the patient denying  alcohol use at that time.  She has progressed where she has severe  pancreatitis causing acute lung injury.   On her current examination, she is on the ventilator on multiple drips  including Fentanyl and Versed.  She is also on an insulin drip for her  severe diabetes.  She is being ventilated at a setting appropriate for  chronic weaning from acute lung injury as determined by the pulmonary  critical care service.  Her lungs on examination are very course and the  patient is overbreathing the vent.  Her saturations were 93% when I saw her  in the room.  Her abdomen is not distended, not particularly very tender on  my examination but she is heavily sedated.  There are no bowel sounds.  I  did review her CT scan which shows diffuse edema consistent with  pancreatitis and also a distal tail body pseudocyst.   The plan currently is to watch her for pancreatitis and hopefully get her  off of the current ventilator settings based on improvement in her acute  lung injury.  Of course, this will require resolution of her pancreatitis to  a large degree.  Right now she only seems to have one system failure and  this is greatly in favor of a better prognosis.  The pancreatitis pseudocyst  is not an acute issue.  It can be dealt with with drainage either  percutaneously or internally with drainage.  The internal drainage can be  accomplished either surgically or endoscopically.     JW/MEDQ  D:  06/27/2004  T:  06/28/2004  Job:  [id number]

## 2010-12-27 NOTE — Consult Note (Signed)
Sarah Leonard, MCDEVITT             ACCOUNT NO.:  [id number]   MEDICAL RECORD NO.:  [id number]          PATIENT TYPE:  INP   LOCATION:  2101                         FACILITY:  [facility]   PHYSICIAN:  Shan Levans, M.D. LHCDATE OF BIRTH:  November 22, 1960   DATE OF CONSULTATION:  06/26/2004  DATE OF DISCHARGE:                                   CONSULTATION   PULMONARY CONSULTATION   CHIEF COMPLAINT:  Acute pancreatitis, respiratory failure.   HISTORY OF PRESENT ILLNESS:  This is a 50 year old white female admitted on  June 23, 2004 with acute pancreatitis, associated abdominal pain and  nausea and vomiting.  The pain got progressively worse, she started having  excess emesis with bilious type vomitus, also had some chronic back pain, a  history of morbid obesity, a history of a previous visit to the emergency  room on October 25 with abdominal pain, discharged on Vicodin, a history of  alcohol abuse and cocaine abuse, bipolar disorder, underlying diabetes.  The  patient was admitted for further evaluation on November 13, got  progressively worse with progressive respiratory distress to the point where  she required intubation this evening with associated acute agitation.   PAST MEDICAL HISTORY:  A history of alcohol and cocaine abuse, bipolar  disorder, depression, type 2 diabetes, chronic back pain.   FAMILY HISTORY:  She has a son who is bipolar, a sister with depression.   SOCIAL HISTORY:  She lives in the [facility].  Denies  any present alcohol or drug use, but did have an admission in 2005 for a  relapse of cocaine use, was in behavioral health for this.  Still smokes a  pack a day of cigarettes.   REVIEW OF SYSTEMS:  Noncontributory.   PHYSICAL EXAMINATION:  VITAL SIGNS:  Temperature 98.0, blood pressure  155/65, pulse 120 with sinus tachycardia, saturations 100%.  CHEST:  Poor air movement, rales bilaterally.  CARDIAC:  Resting tachycardia without S3,  normal S1 and S2.  ABDOMEN:  Protuberant.  Bowel sounds hypoactive.  EXTREMITIES:  No edema or clubbing.  SKIN:  Clear.  NEUROLOGIC:  Intact.  The patient sedated heavily on ventilator support.   LABORATORY DATA:  AST 38, ALT 18, alkaline phosphatase 70, bilirubin 0.9.  Albumin 2.7.  On June 26, 2004 lipase was down to 51, amylase 445 on  November 14 and a peak lipase of 788 on November 14, lipase now down to 51,  as mentioned.  Hemoglobin 12.6, white count 17.4 on November 15.  Today on  the I stat hemoglobin is 11.6.  Sodium 132, potassium 3.2, chloride 99, CO2  33, BUN 16, blood sugar 196.  On 100%, pH 7.38, pCO2 49, pO2 159.  Chest x-  ray shows pulmonary edema, acute lung injury pattern.  Cardiac enzymes are  negative.   IMPRESSION:  Acute respiratory distress with hypoxic respiratory failure,  bilateral pulmonary infiltrates with acute lung injury pattern in a patient  with pancreatitis.  It is interesting that with reduction in pancreatic  enzymes the patient's airway inflammation has actually gotten worse.  The  patient is  now on full ventilator support.   RECOMMENDATIONS:  Full ventilator support with low stretch ARDS protocol.  Check sputum, blood cultures and urine cultures.  Begin empiric Zosyn.  Check BNP, echo, evaluate LV dysfunction.  Check abdominal and chest CT.  Rule out pulmonary embolus and evaluate for aspiration.  Place on  nebulizers.  Replace potassium.  Place on insulin protocol.      Patr   PW/MEDQ  D:  06/26/2004  T:  06/27/2004  Job:  [id number]   cc:   Michaelyn Barter, M.D.

## 2010-12-27 NOTE — Discharge Summary (Signed)
Sarah Leonard, SAYEGH             ACCOUNT NO.:  [id number]   MEDICAL RECORD NO.:  [id number]           PATIENT TYPE:   LOCATION:                               FACILITY:  [facility]   PHYSICIAN:  Hettie Holstein, D.O.    DATE OF BIRTH:  1961-06-29   DATE OF ADMISSION:  DATE OF DISCHARGE:                                 DISCHARGE SUMMARY   ADDENDUM:   MEDICATIONS:  Mrs. Makepeace's subsequent hospital course was uneventful.  She had some adjustments in her psychiatric medications and was evaluated by  Dr. Antonietta Breach with adequate behavioral management of her medications  on transfer as follows:  1.  Lantus 12 units every 12 hours.  2.  Albuterol metered-dose inhaler every 6 hours and 2 puffs every 6 hours,      and 2 puffs every 2 hours as needed.  3.  Trileptal 600 mg 3 times daily.  4.  Ferrous sulfate 325 mg b.i.d.  5.  Protonix 40 mg daily.  6.  Multivitamin daily.  7.  Lasix 20 mg daily.  plus Kdur 20 meq QD.  8.  Seroquel 50 mg every 8 a.m. and every 2 p.m.  9.  Seroquel 200 mg every 6 p.m.  10. Haldol 1 mg every 8 a.m. and every 2 p.m.  11. Haldol 2 mg every 6 p.m.   These were recommended not to be adjusted until the patient is returned to  the setting of her group home.   FOLLOWUP:  She is instructed to follow up with her psychiatrist 1 week  following discharge, currently awaiting this to be coordinated by case  management.   DIET:  Small frequent meals initially clear liquids with slow advancement to  tolerance.  Diet is instructed to be of moderate consistency carbohydrate as tolerated.  She was evaluated by speech therapy and this was their recommendations the  day prior to discharge.  NovoLog sliding scale as follows:  She is  instructed to check her capillary blood glucoses before meals and in the  evening.  For CBG reading of 60-100, she is to get 0 units; 101-150 -- 1  unit; 151-200 -- 2 units; 201-250 -- 4 units; 251-300 -- 6 units; 301-350 --  8 units;  greater than 350 -- give 10 units and call M.D.; if less than 60,  give juice and call M.D.      Eric   ESS/MEDQ  D:  07/23/2004  T:  07/23/2004  Job:  [id number]

## 2010-12-27 NOTE — Discharge Summary (Signed)
NAMEMarland Leonard  IRINI, LEET                       ACCOUNT NO.:  [id number]   MEDICAL RECORD NO.:  [id number]                   PATIENT TYPE:  IPS   LOCATION:  0307                                 [facility]   PHYSICIAN:  Geoffery Lyons, M.D.                   DATE OF BIRTH:  08-31-1960   DATE OF ADMISSION:  03/09/2004  DATE OF DISCHARGE:  03/20/2004                                 DISCHARGE SUMMARY   CHIEF COMPLAINT AND PRESENT ILLNESS:  This was the third or fourth time to  [facility] for this 50 year old divorced white female.  She called 911 the day before so she could get off cocaine.  Relapsed for  about a month prior to this admission.  Suicidal ideation with a plan to cut  her wrist, although she denied having suicidal ideation.  She was  hospitalized in March of 2004.  She overdosed.  Was in [facility]  with a nervous breakdown.  Diagnosed bipolar.  Noncompliant with  medication.  Increasingly more depressed, sad and alone.  Some of friends  came by early in July and got her restarted on cocaine.   PAST PSYCHIATRIC HISTORY:  Was admitted at [facility] in March of  2004 as well as [facility] in December.  Followed up at  [facility].  Poor compliance.   ALCOHOL/DRUG HISTORY:  Began using alcohol at age 10.  Quit using marijuana  in the summer.  Has used variety of substances through her life and she  relapsed on powdered cocaine.   PAST MEDICAL HISTORY:  Followed by [facility].  Migraines.   MEDICATIONS:  Depakote ER 1000 mg at night, Risperdal M-Tab 1 mg at night  and Paxil 20 mg daily.   PHYSICAL EXAMINATION:  Performed and failed to show any acute findings.   LABORATORY DATA:  CBC within normal limits.  Blood chemistry with glucose  100, SGOT 24, SGPT 20.  TSH 3.905.  Valproic acid less than 10.   MENTAL STATUS EXAM:  Alert, cooperative female.  Grooming and dress are  appropriate.   Ambulates without assistance.  Poor eye contact.  Speech was  not pressured.  Mood was depressed and anxious.  Affect was congruent.  Thought processes were clear.  They were goal-oriented, linear.  Would like  to feel better.  There was no evidence of delusions or paranoia.  Denied any  auditory or visual hallucinations.  Cognition was well-preserved.   ADMISSION DIAGNOSES:   AXIS I:  1.  Bipolar disorder, depressed.  2.  Cocaine abuse.   AXIS II:  No diagnosis.   AXIS III:  Migraines.   AXIS IV:  Moderate.   AXIS V:  Global Assessment of Functioning upon admission 30; highest Global  Assessment of Functioning in the last year 60-65.   HOSPITAL COURSE:  She was admitted and started in individual  and group  psychotherapy.  She was given Ambien for sleep.  She was maintained on  Depakote ER 1000 mg at night, Risperdal 1 mg M-Tab at night, Paxil 20 mg  daily.  Was given Midrin for headache.  She was given some Vistaril for  anxiety.  She was given some Ativan.  She was started on Neurontin 100 mg  three times a day to what she reacted adversely and she was switched to  Seroquel 50 mg twice a day and 100 mg at night.  Eventually, we discontinued  the Risperdal and we continued to work with the Seroquel.  She did endorse  she went off her medication, got with the wrong people and relapsed.  Has  been staying by herself.  Endorsed the loneliness affects her, gets easily  upset.  Endorsed being anxious, restless, easily agitated.  Lots of shame  and guilt of what she has done.  Endorsed passive suicidal ideation.  Endorsed difficulty with her nerves, very distraught, dealing with the guilt  for the relapse.  She continued to evidence depression and anxiety.  She was  wanting to find a place where she could go where she could be with other  people.  The concern was that in an assisted-living facility group home, her  son could not stay overnight, something she is used to and likes to do  in  the apartment where she is but also aware that going back to the situation  where she was by herself could create the right environment for relapse.  Endorsed anxiety.  Endorsed mood swings.  Would have like benzodiazepines  but, due to the fact of the addictive nature of this medication, we  continued to work with medication that was not addictive.  Endorsed  depression, anxiety, agitation.  On March 15, 2004, we continued to work  with the Seroquel.  Helped her deal with the shame and the guilt.  She had  some mood fluctuation, felt relapsing.  She tolerated the Seroquel well.  Continued to endorse the depression, trying to make a decision of where to  go.  We increased the Paxil to 30 mg.  She was willing to, after all, go to  the group home.  Once she made the decision, the anxiety dropped markedly.  She turned around.  She was more active, more involved.  Her mood got much  better.  Affect became better.  She felt she was ready to go.  On March 20, 2004, she was in full contact with reality.  Feeling much better.  No  suicidal ideation.  No homicidal ideation.  No hallucinations.  No  delusions.  Going to the group home.  Positive about the decision as  loneliness was a trigger in the past and she saw this plan of going to the  group home as a way to prevent further relapses.   DISCHARGE DIAGNOSES:   AXIS I:  1.  Bipolar disorder, depressed.  2.  Cocaine abuse.   AXIS II:  No diagnosis.   AXIS III:  Migraines.   AXIS IV:  Moderate.   AXIS V:  Global Assessment of Functioning upon discharge 55-60.   DISCHARGE MEDICATIONS:  1.  Seroquel 50 mg twice a day and 200 mg at night.  2.  Depakote ER 1250 mg at night.  3.  Paxil 30 mg per day.  4.  Vistaril 50 mg four times a day as needed.  5.  Ambien 10 mg at bedtime for  sleep.   FOLLOW UP:  [facility] Dr. Lang Snow.                                              Geoffery Lyons, M.D.    IL/MEDQ  D:   04/10/2004  T:  04/11/2004  Job:  [id number]

## 2010-12-27 NOTE — H&P (Signed)
Sarah Leonard, Sarah Leonard             ACCOUNT NO.:  [id number]   MEDICAL RECORD NO.:  [id number]          PATIENT TYPE:  INP   LOCATION:  1823                         FACILITY:  [facility]   PHYSICIAN:  Mallory Shirk, MD     DATE OF BIRTH:  08/30/1960   DATE OF ADMISSION:  06/23/2004  DATE OF DISCHARGE:                                HISTORY & PHYSICAL   PRIMARY CARE PHYSICIAN:  Dr. Lovell Sheehan   CHIEF COMPLAINT:  Nausea, vomiting, abdominal pain x24 hours.   HISTORY OF PRESENT ILLNESS:  Sarah Leonard is a 50 year old Caucasian woman  with a history of alcohol and cocaine abuse that presented to the emergency  department on June 23, 2004 with chief complaint of nausea, vomiting,  abdominal pain which started about last evening right after she ate some  dinner at the fast food restaurant.  This a.m. her abdominal pain got worse.  She started vomiting.  Vomitus was mainly food but bilious.  The patient  also had diffuse abdominal pain mainly epigastric but also left upper  quadrant radiating to the back.  The patient has a history of chronic back  pain.  The patient is morbidly obese.  Of note, the patient was seen in the  ED at [facility] on June 04, 2004 with diffuse abdominal pain and was  discharged on Vicodin.  The patient has no other complaints at the current  time.   PAST MEDICAL HISTORY:  1.  Alcohol abuse.  2.  Cocaine abuse.  3.  Bipolar disorder.  4.  Depression.  5.  Diabetes mellitus.  6.  Chronic back pain.   FAMILY HISTORY:  The patient has a son who has bipolar disorder and a sister  with depression.   SOCIAL HISTORY:  The patient lives at the [facility].  Denies any present alcohol or drug use.  Still smokes one pack of cigarettes  per day.  The patient states that her last cocaine use was 2 years ago;  however, the patient had an admission to [facility] on  March 09, 2004 for when the patient called 911 so she could get off  cocaine.  This was a relapse that had happened a month prior to the admission on March 09, 2004.   REVIEW OF SYSTEMS:  The patient denies any headaches, any visual changes,  any shortness of breath.  Complains of nausea, vomiting, abdominal pain, low  back pain, and also complains of edema in the lower extremities.  The  patient is able to ambulate without and difficulty, has not eaten since last  evening.   PHYSICAL EXAMINATION:  VITAL SIGNS ON ADMISSION:  Blood pressure 126/80,  pulse 104, temperature 98.6, respirations 26.  GENERAL:  A middle aged obese Caucasian woman lying in bed and look in  moderate distress.  Alert and oriented x3, cooperative with the exam, was  able to walk unassisted to the restroom and back.  HEENT:  Normocephalic, atraumatic, PERRL, extraocular muscles intact.  No  LAD, no JVD.  NECK:  Supple.  LUNGS:  Clear to auscultation  bilaterally.  No wheezes no rales.  CARDIOVASCULAR:  S1 & S2 tachycardic.  No murmurs, rubs, or gallops.  ABDOMEN:  Positive bowel sounds.  Exam inconsistent with the patient's  complaint of pain.  The patient has exquisite tenderness on the left upper  quadrant and epigastric quadrant.  However, auscultation with significant  pressure does not elicit the same pain.  No organomegaly __________ felt,  the patient's body habitus makes this exam difficulty.  EXTREMITIES:  2+ pitting edema in lower extremities.  NEUROLOGICAL:  No focal neurologic changes seen, extraocular muscles intact.  DTR's 2+ symmetrical.  Sensory and motor symmetrical and within normal  limits in all four extremities.   IMAGING:  Chest x-ray no active disease.  Abdominal x-ray nonspecific bowel  gas pattern.   LABORATORY DATA ON ADMISSION:  Sodium 132, potassium 3, chloride 89, carbon  dioxide 22, glucose 173, BUN 15, creatinine 1, calcium 9.6, total protein  7.8, albumin 4.4, AST 46, ALT 37, alkaline phosphatase 74, total bili 1.2,  lipase 333.  WBC 18.2,  hemoglobin 16, hematocrit 44.8, MCV 94.1, platelets  436.   IMPRESSION:  A 50 year old Caucasian woman with a history of multiple  substance abuse including alcohol, presents with acute abdominal pain x24  with nausea and vomiting.   ASSESSMENT AND PLAN:  1.  Acute pancreatitis.  Lipase 333, unclear etiology.  The patient denies      any alcohol use.  The patient will be kept NPO except medications.  She      will also be given Demerol IV along with Phenergan for control of      nausea, vomiting, and pain.  Lipase and amylase will be checked again in      the a.m. along with CBC and BMP.  2.  Chronic low back pain.  Pain will be managed again as stated above with      Demerol IV.  3.  Diabetes mellitus.  The patient will be on a sliding scale insulin with      Accu-Cheks q.6 h.  A1C will be checked tomorrow.  4.  Psychiatric issues.  Her home medications for depression and bipolar      disorder have been renewed.   The patient will be followed by the [facility], Team B.      GDK/MEDQ  D:  06/23/2004  T:  06/23/2004  Job:  [id number]

## 2010-12-27 NOTE — Discharge Summary (Signed)
Sarah Leonard, Sarah Leonard             ACCOUNT NO.:  [id number]   MEDICAL RECORD NO.:  [id number]          PATIENT TYPE:  INP   LOCATION:                               FACILITY:  [facility]   PHYSICIAN:  Marcie Mowers, M.D.DATE OF BIRTH:  [birth year]   DATE OF ADMISSION:  06/23/2004  DATE OF DISCHARGE:  07/27/2004                                 DISCHARGE SUMMARY   ADDENDUM:  To discharge summary that was done by Dr. __________  on July 22, 2004.  On July 27, 2004, the results of stool sample of Clostridium  difficile toxin came out to be negative.  The patient does have diarrhea  with frequency of bowel movements 3-4 times daily.  It is negative for any  blood or mucus.  This is most likely a noninfectious diarrhea.  It could be  related to her episode of pancreatitis.  No active treatment is required at  this time.  Imodium will be prescribed p.r.n. for the symptoms.  She will  need to follow with her primary care physician, Dr. Lovell Sheehan, after  discharge.   CONDITION UPON DISCHARGE:  Good.   VITAL SIGNS:  Her blood pressure is 126/77, temperature is 98.2.  She has  been afebrile since the discharge summary was initially done on July 22, 2004.  Her capillary blood sugars have been well controlled.   She will be discharged on the following medications:  1.  Lantus insulin 12 units every 12 hours.  2.  Novolog insulin sliding scale.  3.  Albuterol metered-dose inhaler, two puffs every six hours.  4.  Trileptal 600 mg two times daily.  5.  Seroquel 50 mg at 8 in the morning and at 2 p.m. and Seroquel 200 mg at      6 p.m.  6.  Haldol 1 mg at 8 in the morning and the next dose at 2 p.m. and then      Haldol 2 mg at 6 p.m.  7.  Protonix 40 mg daily.  8.  Ferrous sulfate 225 mg twice daily.  9.  Tylenol, over the counter, as required for pain.  10. Phenergan 12.5 mg every four to six hours as needed for nausea.  11. Imodium as directed on discharge instructions on a  p.r.n. basis.   The patient will need to make an appointment with her primary care  physician, Dr. Lovell Sheehan, within one week of discharge.  She will also follow  up at the [facility] on Tuesday, July 30, 2004 at 9:30 a.m. with  __________  .        ___________________________________________  Marcie Mowers, M.D.    PMJ/MEDQ  D:  07/27/2004  T:  07/27/2004  Job:  [id number]

## 2010-12-27 NOTE — Discharge Summary (Signed)
NAME:  Sarah Leonard, Sarah Leonard                       ACCOUNT NO.:  [id number]   MEDICAL RECORD NO.:  [id number]                   PATIENT TYPE:  IPS   LOCATION:  0504                                 [facility]   PHYSICIAN:  Geoffery Lyons, M.D.                   DATE OF BIRTH:  1961/06/19   DATE OF ADMISSION:  07/13/2003  DATE OF DISCHARGE:  07/20/2003                                 DISCHARGE SUMMARY   CHIEF COMPLAINT AND PRESENTING ILLNESS:  This was one of a couple of  admissions to [facility]  for this 50 year old divorced  white female, voluntarily admitted.  She has a history of polysubstance  abuse since 38, relapsed on cocaine 2-3 days ago after being sober for 75  days.  Her family found that things were missing from her parents' home  where the patient had been living for the past 2 weeks.  They said she must  leave the house.  She is now homeless, staying one night at a hotel, then  presented herself to the emergency room complaining of feeling hopeless,  suicidal thoughts, thoughts of cutting herself with a knife.  History of  prior overdose.  Sense of hopelessness, suicidal ideas, no homicidal ideas,  no auditory or visual hallucinations.   PAST PSYCHIATRIC HISTORY:  Bipolar disorder, history of multiple admissions  to [facility].   ALCOHOL AND DRUG HISTORY:  Has been abusing drugs since age 51, longest  period clean and sober a year.  Pain pills, cocaine.  No recent use of  alcohol or pills but more recent relapse on cocaine.   PAST MEDICAL HISTORY:  Migraine headaches.   MEDICATIONS:  Risperdal 3 mg at night, Depakote dose unclear, Paxil dose  unclear.   PHYSICAL EXAMINATION:  Performed, failed to show any acute findings.   MENTAL STATUS EXAM:  Reveals a fully alert female, tearful, quite  irritability both in affect and in manner.  Some thought agitation.  Some  mild psychomotor agitation.  Speech within normal limits, pace and tone.  Mood is depressed, irritable.  Thought processes are remarkable for some  mild agitation.  No clear auditory or visual hallucinations, positive for  suicidal thoughts, thoughts of cutting on herself.  No flight of ideas or  reference, no paranoia.  Cognition well preserved.   ADMISSION DIAGNOSES:   AXIS I:  1. Bipolar disorder.  2. Cocaine dependence.  3. Polysubstance abuse.   AXIS II:  No diagnosis.   AXIS III:  No diagnosis.   AXIS IV:  Moderate.   AXIS V:  Global assessment of function upon admission 32, highest global  assessment of function in past year 61.   LABORATORY DATA:  HgA1C 5.6, thyroid profile within normal limits.   COURSE IN HOSPITAL:  She was admitted and started on intensive individual  and group psychotherapy.  She was given trazodone for sleep, Imitrex for  headache, Risperdal 3 mg at night, Depakote 250 twice a day, Paxil 10.  Risperdal was increased to 0.5 twice a day and 3 at night.  She was given  Vistaril as needed, Depakote ER 500 twice a day.  Trazodone was increased to  150 at night.  She was given Neurontin 100 3 times a day and Symmetrel 100  twice a day.  Initially very distraught, feeling shame and guilty for having  relapsed, but clear about the pattern, the need to having increased her  medication, getting back in touch with the people who used to supply drugs,  committed to doing things differently this time around.  Depression, not  sleeping too well, not feeling too good, tearful, at times irritable,  anxious, dealing with cravings.  But it seemed that the medications started  kicking in, starting sleeping now, started sleeping better.  She heard that  she was going to [facility], that she had an apartment that really  decreased her level of stress.  This family was willing to keep her for the  night and take her to her new apartment the next day.  There were no  suicidal or homicidal ideas, tolerating the medications well.  So  on  December 9 she was discharged to outpatient follow-up.   DISCHARGE DIAGNOSES:   AXIS I:  1. Bipolar disorder.  2. Cocaine dependence.  3. Polysubstance abuse in partial remission.   AXIS II:  No diagnosis.   AXIS III:  No diagnosis.   AXIS IV:  Moderate.   AXIS V:  Global assessment of function upon discharge 50,   DISCHARGE MEDICATIONS:  1. Risperdal 10 mg at night.  2. Afrin one spray each nostril.  3. Paxil CR 12.5 pills daily.  4. Depakote CR 500 twice a day.  5. Neurontin 100 3 times a day.  6. Symmetrel 100 twice a day.  7. Trazodone 150 1-2 at bedtime.   DISPOSITION:  Follow up [facility].                                               Geoffery Lyons, M.D.    IL/MEDQ  D:  08/01/2003  T:  08/02/2003  Job:  [id number]

## 2011-11-01 ENCOUNTER — Encounter (HOSPITAL_COMMUNITY): Payer: Self-pay | Admitting: Emergency Medicine

## 2011-11-01 ENCOUNTER — Emergency Department (HOSPITAL_COMMUNITY)
Admission: EM | Admit: 2011-11-01 | Discharge: 2011-11-01 | Disposition: A | Discharge status: Home / Self Care | Payer: Medicaid Other | Attending: Emergency Medicine | Admitting: Emergency Medicine

## 2011-11-01 DIAGNOSIS — F17200 Nicotine dependence, unspecified, uncomplicated: Secondary | ICD-10-CM | POA: Insufficient documentation

## 2011-11-01 DIAGNOSIS — K029 Dental caries, unspecified: Secondary | ICD-10-CM | POA: Insufficient documentation

## 2011-11-01 DIAGNOSIS — S025XXA Fracture of tooth (traumatic), initial encounter for closed fracture: Secondary | ICD-10-CM | POA: Insufficient documentation

## 2011-11-01 DIAGNOSIS — X58XXXA Exposure to other specified factors, initial encounter: Secondary | ICD-10-CM | POA: Insufficient documentation

## 2011-11-01 MED ORDER — PENICILLIN V POTASSIUM 250 MG PO TABS
500.0000 mg | ORAL_TABLET | Freq: Once | ORAL | Status: AC
  Administered 2011-11-01: 500 mg via ORAL
  Filled 2011-11-01: qty 2

## 2011-11-01 MED ORDER — PENICILLIN V POTASSIUM 500 MG PO TABS: 500.0000 mg | ORAL_TABLET | Freq: Four times a day (QID) | ORAL | Status: AC

## 2011-11-01 MED ORDER — HYDROCODONE-ACETAMINOPHEN 5-325 MG PO TABS
1.0000 | ORAL_TABLET | Freq: Once | ORAL | Status: AC
  Administered 2011-11-01: 1 via ORAL
  Filled 2011-11-01: qty 1

## 2011-11-01 MED ORDER — HYDROCODONE-ACETAMINOPHEN 5-325 MG PO TABS: 2.0000 | ORAL_TABLET | Freq: Four times a day (QID) | ORAL | Status: AC | PRN

## 2011-11-01 NOTE — ED Provider Notes (Signed)
History     CSN: [id number]  Arrival date & time 11/01/11  1057   First MD Initiated Contact with Patient 11/01/11 1125      Chief Complaint  Patient presents with  . Dental Pain    (Consider location/radiation/quality/duration/timing/severity/associated sxs/prior treatment) HPI Patient is a 51 year old female who presents today complaining of dental. With a fractured tooth as well as right-sided facial swelling. Patient denies any fevers, nausea, or vomiting. She is planning on following up on this with her dentist, Dr. Gala Romney, on Monday. Patient reports that she would like I nerve block as she has 9/10 mouth pain. Patient also complains of pain radiating to her right ear and face. Patient does have slight swelling over the right maxilla. Patient has no difficulty swallowing or breathing. There've been no voice changes.There are no other associated or modifying factors.  History reviewed. No pertinent past medical history.  History reviewed. No pertinent past surgical history.  History reviewed. No pertinent family history.  History  Substance Use Topics  . Smoking status: Current Everyday Smoker -- 0.5 packs/day  . Smokeless tobacco: Not on file  . Alcohol Use: No    OB History    Grav Para Term Preterm Abortions TAB SAB Ect Mult Living                  Review of Systems  Constitutional: Negative.   HENT: Positive for ear pain, facial swelling and dental problem.   Eyes: Negative.   Respiratory: Negative.   Cardiovascular: Negative.   Gastrointestinal: Negative.   Genitourinary: Negative.   Musculoskeletal: Negative.   Skin: Negative.   Neurological: Negative.   Hematological: Negative.   Psychiatric/Behavioral: Negative.   All other systems reviewed and are negative.    Allergies  Review of patient's allergies indicates no known allergies.  Home Medications   Current Outpatient Rx  Name Route Sig Dispense Refill  . ACETAMINOPHEN 325 MG PO TABS Oral  Take 325 mg by mouth every 8 (eight) hours as needed. For pain    . ALBUTEROL SULFATE HFA 108 (90 BASE) MCG/ACT IN AERS Inhalation Inhale 2 puffs into the lungs every 4 (four) hours as needed. For shortness of breath    . ALPRAZOLAM 0.5 MG PO TABS Oral Take 0.5 mg by mouth daily as needed. For anxiety    . ALPRAZOLAM 1 MG PO TABS Oral Take 1 mg by mouth 3 (three) times daily.    . ASPIRIN EC 81 MG PO TBEC Oral Take 81 mg by mouth daily.    Marland Kitchen CALCIUM CARBONATE-VITAMIN D 500-200 MG-UNIT PO TABS Oral Take 1 tablet by mouth 2 (two) times daily.    Marland Kitchen VITAMIN D3 1000 UNITS PO TABS Oral Take 1,000 Units by mouth daily.    Marland Kitchen DOCUSATE SODIUM 100 MG PO CAPS Oral Take 100 mg by mouth 2 (two) times daily.    . DULOXETINE HCL 60 MG PO CPEP Oral Take 60 mg by mouth 2 (two) times daily.    . FENTANYL 25 MCG/HR TD PT72 Transdermal Place 1 patch onto the skin every 3 (three) days.    . OMEGA-3 FATTY ACIDS 1000 MG PO CAPS Oral Take 1 g by mouth daily.    Marland Kitchen FLUTICASONE-SALMETEROL 250-50 MCG/DOSE IN AEPB Inhalation Inhale 1 puff into the lungs every 12 (twelve) hours.    . FUROSEMIDE 20 MG PO TABS Oral Take 20-40 mg by mouth 2 (two) times daily. 2 tabs in the am, 1 tab in the pm    .  GABAPENTIN 600 MG PO TABS Oral Take 600 mg by mouth 3 (three) times daily.    Marland Kitchen HYDROCODONE-ACETAMINOPHEN 5-500 MG PO TABS Oral Take 1 tablet by mouth every 6 (six) hours as needed. For pain    . INSULIN ASPART 100 UNIT/ML [place] SOLN Subcutaneous Inject 1-10 Units into the skin 3 (three) times daily before meals. <60 give orange juice & call MD; 60-100=0 units, 101-150=1 units, 151-200=2 units, 201-250=4 units, 251-300=6 units, 301-350=8 units, >350=10 units    . INSULIN GLARGINE 100 UNIT/ML [place] SOLN Subcutaneous Inject 12-16 Units into the skin 2 (two) times daily. 12 units in the am, 16 units at bedtime    . LAMOTRIGINE 100 MG PO TABS Oral Take 100 mg by mouth 2 (two) times daily. Takes with 25 mg tab    . LAMOTRIGINE 25 MG PO TABS Oral  Take 25 mg by mouth 2 (two) times daily.    Marland Kitchen LISINOPRIL 40 MG PO TABS Oral Take 40 mg by mouth daily.    . MELOXICAM 15 MG PO TABS Oral Take 15 mg by mouth daily.    Marland Kitchen MIRTAZAPINE 30 MG PO TBDP Oral Take 30 mg by mouth at bedtime.    Marland Kitchen NIACIN ER (ANTIHYPERLIPIDEMIC) 500 MG PO TBCR Oral Take 500 mg by mouth at bedtime.    . OMEPRAZOLE 20 MG PO CPDR Oral Take 20 mg by mouth every evening.    Marland Kitchen POTASSIUM CHLORIDE CRYS ER 20 MEQ PO TBCR Oral Take 20 mEq by mouth 2 (two) times daily.    Marland Kitchen PROMETHAZINE HCL 12.5 MG PO TABS Oral Take 12.5 mg by mouth every 4 (four) hours as needed. For nausea    . QUETIAPINE FUMARATE 100 MG PO TABS Oral Take 100 mg by mouth at bedtime. Takes with 25 mg    . QUETIAPINE FUMARATE 200 MG PO TABS Oral Take 200 mg by mouth at bedtime.    Marland Kitchen QUETIAPINE FUMARATE 25 MG PO TABS Oral Take 25 mg by mouth every morning. Takes with 100 mg    . SIMVASTATIN 40 MG PO TABS Oral Take 40 mg by mouth daily.    Marland Kitchen TIOTROPIUM BROMIDE MONOHYDRATE 18 MCG IN CAPS Inhalation Place 18 mcg into inhaler and inhale daily.    . VENLAFAXINE HCL 75 MG PO TABS Oral Take 75 mg by mouth every morning.    Marland Kitchen ZOLPIDEM TARTRATE 10 MG PO TABS Oral Take 10 mg by mouth at bedtime.    Marland Kitchen HYDROCODONE-ACETAMINOPHEN 5-325 MG PO TABS Oral Take 2 tablets by mouth every 6 (six) hours as needed for pain. 15 tablet 0  . PENICILLIN V POTASSIUM 500 MG PO TABS Oral Take 1 tablet (500 mg total) by mouth 4 (four) times daily. 28 tablet 0    BP 120/84  Pulse 84  Temp 98.3 F (36.8 C)  Resp 22  SpO2 97%  Physical Exam  Nursing note and vitals reviewed. GEN: Well-developed, well-nourished female in no distress, uncomfortable HEENT: Atraumatic, normocephalic. Patient has several dental care is with fracture of tooth 6 and 7 with obvious decay There is some erythema over the maxillary sinus surrounding this. Left TM is clear. Right TM is occluded with cerumen. EYES: PERRLA BL, no scleral icterus. NECK: Trachea midline, no  meningismus CV: regular rate and rhythm. No murmurs, rubs, or gallops PULM: No respiratory distress.   GU: deferred Neuro: cranial nerves 2-12 intact, no abnormalities of strength or sensation, A and O x 3 MSK: Patient moves all 4 extremities  symmetrically, no deformity, edema, or injury noted Skin: No rashes petechiae, purpura, or jaundice Psych: no abnormality of mood   ED Course  Dental Date/Time: 11/01/2011 12:15 PM Performed by: Cyndra Numbers Authorized by: Cyndra Numbers Consent: Verbal consent obtained. Written consent not obtained. Risks and benefits: risks, benefits and alternatives were discussed Consent given by: patient Patient identity confirmed: verbally with patient Comments: Patient had right anterior superior alveolar nerve block performed. Patient received 2 cc of 0.25% Marcaine injection. She tolerated procedure well. Patient had relief following this.   (including critical care time)  Labs Reviewed - No data to display No results found.   1. Dental caries       MDM  Patient was evaluated by myself. She had presentation consistent with dental care he is an associated pain. Patient was given Pen-Vee K and a seven-day prescription for this. Digital block was performed. Patient was discharged home with prescription for 15 tabs of Vicodin. She is to followup with her dentist on Monday. She had no systemic signs of infection and was discharged in improved condition        Cyndra Numbers, MD 11/01/11 1216

## 2011-11-01 NOTE — ED Notes (Signed)
[facility] called to come and transport pt back to her facility; will come when a unit is available

## 2011-11-01 NOTE — Discharge Instructions (Signed)
Dental Pain  A tooth ache may be caused by cavities (tooth decay). Cavities expose the nerve of the tooth to air and hot or cold temperatures. It may come from an infection or abscess (also called a boil or furuncle) around your tooth. It is also often caused by dental caries (tooth decay). This causes the pain you are having.  DIAGNOSIS   Your caregiver can diagnose this problem by exam.  TREATMENT    If caused by an infection, it may be treated with medications which kill germs (antibiotics) and pain medications as prescribed by your caregiver. Take medications as directed.   Only take over-the-counter or prescription medicines for pain, discomfort, or fever as directed by your caregiver.   Whether the tooth ache today is caused by infection or dental disease, you should see your dentist as soon as possible for further care.  SEEK MEDICAL CARE IF:  The exam and treatment you received today has been provided on an emergency basis only. This is not a substitute for complete medical or dental care. If your problem worsens or new problems (symptoms) appear, and you are unable to meet with your dentist, call or return to this location.  SEEK IMMEDIATE MEDICAL CARE IF:    You have a fever.   You develop redness and swelling of your face, jaw, or neck.   You are unable to open your mouth.   You have severe pain uncontrolled by pain medicine.  MAKE SURE YOU:    Understand these instructions.   Will watch your condition.   Will get help right away if you are not doing well or get worse.  Document Released: 07/28/2005 Document Revised: 07/17/2011 Document Reviewed: 03/15/2008  [employer] Patient Information 2012 [employer]        Dental Caries   Tooth decay (dental caries, cavities) is the most common of all oral diseases. It occurs in all ages but is more common in children and young adults.   CAUSES   Bacteria in your mouth combine with foods (particularly sugars and starches) to produce plaque. Plaque is a  substance that sticks to the hard surfaces of teeth. The bacteria in the plaque produce acids that attack the enamel of teeth. Repeated acid attacks dissolve the enamel and create holes in the teeth. Root surfaces of teeth may also get these holes.   Other contributing factors include:    Frequent snacking and drinking of cavity-producing foods and liquids.   Poor oral hygiene.   Dry mouth.   Substance abuse such as methamphetamine.   Broken or poor fitting dental restorations.   Eating disorders.   Gastroesophageal reflux disease (GERD).   Certain radiation treatments to the head and neck.  SYMPTOMS   At first, dental decay appears as white, chalky areas on the enamel. In this early stage, symptoms are seldom present. As the decay progresses, pits and holes may appear on the enamel surfaces. Progression of the decay will lead to softening of the hard layers of the tooth. At this point you may experience some pain or achy feeling after sweet, hot, or cold foods or drinks are consumed. If left untreated, the decay will reach the internal structures of the tooth and produce severe pain. Extensive dental treatment, such as root canal therapy, may be needed to save the tooth at this late stage of decay development.   DIAGNOSIS   Most cavities will be detected during regular check-ups. A thorough medical and dental history will be taken by   the dentist. The dentist will use instruments to check the surfaces of your teeth for any breakdown or discoloration. Some dentists have special instruments, such as lasers, that detect tooth decay. Dental X-rays may also show some cavities that are not visible to the eye (such as between the contact areas of the teeth).  TREATMENT   Treatment involves removal of the tooth decay and replacement with a restorative material such as silver, gold, or composite (white) material. However, if the decay involves a large area of the tooth and there is little remaining healthy tooth  structure, a cap (crown) will be fitted over the remaining structure. If the decay involves the center part of the tooth (pulp), root canal treatment will be needed before any type of dental restoration is placed. If the tooth is severely destroyed by the decay process, leaving the remaining tooth structures unrestorable, the tooth will need to be pulled (extracted). Some early tooth decay may be reversed by fluoride treatments and thorough brushing and flossing at home.  PREVENTION    Eat healthy foods. Restrict the amount of sugary, starchy foods and liquids you consume. Avoid frequent snacking and drinking of unhealthy foods and liquids.   Sealants can help with prevention of cavities. Sealants are composite resins applied onto the biting surfaces of teeth at risk for decay. They smooth out the pits and grooves and prevent food from being trapped in them. This is done in early childhood before tooth decay has started.   Fluoride tablets may also be prescribed to children between 6 months and 10 years of age if your drinking water is not fluoridated. The fluoride absorbed by the tooth enamel makes teeth less susceptible to decay. Thorough daily cleaning with a toothbrush and dental floss is the best way to prevent cavities. Use of a fluoride toothpaste is highly recommended. Fluoride mouth rinses may be used in specific cases.   Topical application of fluoride by your dentist is important in children.   Regular visits with a dentist for checkups and cleanings are also important.  SEEK IMMEDIATE DENTAL CARE IF:   You have a fever.   You develop redness and swelling of your face, jaw, or neck.   You develop swelling around a tooth.   You are unable to open your mouth or cannot swallow.   You have severe pain uncontrolled by pain medicine.  Document Released: 04/19/2002 Document Revised: 07/17/2011 Document Reviewed: 01/02/2011  [employer] Patient Information 2012 [employer]

## 2011-11-01 NOTE — ED Notes (Signed)
Pt. Stated, My tooth is pounding and I have sinus pain since yesterday

## 2012-03-02 ENCOUNTER — Emergency Department (HOSPITAL_COMMUNITY)
Admission: EM | Admit: 2012-03-02 | Discharge: 2012-03-02 | Disposition: A | Discharge status: Home / Self Care | Payer: Medicaid Other | Attending: Emergency Medicine | Admitting: Emergency Medicine

## 2012-03-02 ENCOUNTER — Encounter (HOSPITAL_COMMUNITY): Payer: Self-pay

## 2012-03-02 ENCOUNTER — Emergency Department (HOSPITAL_COMMUNITY): Payer: Medicaid Other

## 2012-03-02 DIAGNOSIS — M549 Dorsalgia, unspecified: Secondary | ICD-10-CM

## 2012-03-02 DIAGNOSIS — I10 Essential (primary) hypertension: Secondary | ICD-10-CM | POA: Insufficient documentation

## 2012-03-02 DIAGNOSIS — N3090 Cystitis, unspecified without hematuria: Secondary | ICD-10-CM

## 2012-03-02 DIAGNOSIS — Z794 Long term (current) use of insulin: Secondary | ICD-10-CM | POA: Insufficient documentation

## 2012-03-02 DIAGNOSIS — R109 Unspecified abdominal pain: Secondary | ICD-10-CM | POA: Insufficient documentation

## 2012-03-02 DIAGNOSIS — E119 Type 2 diabetes mellitus without complications: Secondary | ICD-10-CM | POA: Insufficient documentation

## 2012-03-02 LAB — URINALYSIS, ROUTINE W REFLEX MICROSCOPIC
Bilirubin Urine: NEGATIVE
Glucose, UA: NEGATIVE mg/dL
Hgb urine dipstick: NEGATIVE
Ketones, ur: NEGATIVE mg/dL
Nitrite: NEGATIVE
Protein, ur: NEGATIVE mg/dL
Specific Gravity, Urine: 1.017 (ref 1.005–1.030)
Urobilinogen, UA: 0.2 mg/dL (ref 0.0–1.0)
pH: 5.5 (ref 5.0–8.0)

## 2012-03-02 LAB — CBC WITH DIFFERENTIAL/PLATELET
Basophils Absolute: 0 K/uL (ref 0.0–0.1)
Basophils Relative: 0 % (ref 0–1)
Eosinophils Absolute: 0.5 10*3/uL (ref 0.0–0.7)
Eosinophils Relative: 5 % (ref 0–5)
HCT: 38.8 % (ref 36.0–46.0)
Hemoglobin: 13 g/dL (ref 12.0–15.0)
Lymphocytes Relative: 41 % (ref 12–46)
Lymphs Abs: 4.2 10*3/uL — ABNORMAL HIGH (ref 0.7–4.0)
MCH: 32.4 pg (ref 26.0–34.0)
MCHC: 33.5 g/dL (ref 30.0–36.0)
MCV: 96.8 fL (ref 78.0–100.0)
Monocytes Absolute: 0.5 10*3/uL (ref 0.1–1.0)
Monocytes Relative: 5 % (ref 3–12)
Neutro Abs: 5 K/uL (ref 1.7–7.7)
Neutrophils Relative %: 49 % (ref 43–77)
Platelets: 233 K/uL (ref 150–400)
RBC: 4.01 MIL/uL (ref 3.87–5.11)
RDW: 14.4 % (ref 11.5–15.5)
WBC: 10.3 K/uL (ref 4.0–10.5)

## 2012-03-02 LAB — COMPREHENSIVE METABOLIC PANEL WITH GFR
ALT: 24 U/L (ref 0–35)
Alkaline Phosphatase: 91 U/L (ref 39–117)
BUN: 39 mg/dL — ABNORMAL HIGH (ref 6–23)
CO2: 28 meq/L (ref 19–32)
Chloride: 100 meq/L (ref 96–112)
GFR calc Af Amer: 50 mL/min — ABNORMAL LOW (ref >90)
Glucose, Bld: 123 mg/dL — ABNORMAL HIGH (ref 70–99)
Potassium: 5.9 meq/L — ABNORMAL HIGH (ref 3.5–5.1)
Total Bilirubin: 0.1 mg/dL — ABNORMAL LOW (ref 0.3–1.2)

## 2012-03-02 LAB — LIPASE, BLOOD: Lipase: 39 U/L (ref 11–59)

## 2012-03-02 LAB — URINE MICROSCOPIC-ADD ON

## 2012-03-02 LAB — COMPREHENSIVE METABOLIC PANEL
AST: 47 U/L — ABNORMAL HIGH (ref 0–37)
Albumin: 3.7 g/dL (ref 3.5–5.2)
Calcium: 9.7 mg/dL (ref 8.4–10.5)
Creatinine, Ser: 1.38 mg/dL — ABNORMAL HIGH (ref 0.50–1.10)
GFR calc non Af Amer: 43 mL/min — ABNORMAL LOW (ref >90)
Sodium: 137 mEq/L (ref 135–145)
Total Protein: 7.1 g/dL (ref 6.0–8.3)

## 2012-03-02 MED ORDER — CIPROFLOXACIN HCL 500 MG PO TABS: 500.0000 mg | ORAL_TABLET | Freq: Two times a day (BID) | ORAL | Status: AC

## 2012-03-02 MED ORDER — FENTANYL CITRATE 0.05 MG/ML IJ SOLN
50.0000 ug | Freq: Once | INTRAMUSCULAR | Status: AC
  Administered 2012-03-02: 50 ug via INTRAVENOUS
  Filled 2012-03-02: qty 2

## 2012-03-02 MED ORDER — SODIUM CHLORIDE 0.9 % IV BOLUS (SEPSIS)
1000.0000 mL | Freq: Once | INTRAVENOUS | Status: AC
  Administered 2012-03-02: 1000 mL via INTRAVENOUS

## 2012-03-02 MED ORDER — IOHEXOL 300 MG/ML  SOLN
100.0000 mL | Freq: Once | INTRAMUSCULAR | Status: AC | PRN
  Administered 2012-03-02: 100 mL via INTRAVENOUS

## 2012-03-02 MED ORDER — IOHEXOL 300 MG/ML  SOLN
20.0000 mL | INTRAMUSCULAR | Status: AC
  Administered 2012-03-02 (×2): 20 mL via ORAL

## 2012-03-02 NOTE — ED Notes (Signed)
Pt ambulates without difficulty

## 2012-03-02 NOTE — Discharge Instructions (Signed)
 Back Pain, Adult Low back pain is very common. About 1 in 5 people have back pain.The cause of low back pain is rarely dangerous. The pain often gets better over time.About half of people with a sudden onset of back pain feel better in just 2 weeks. About 8 in 10 people feel better by 6 weeks.  CAUSES Some common causes of back pain include:  Strain of the muscles or ligaments supporting the spine.   Wear and tear (degeneration) of the spinal discs.   Arthritis.   Direct injury to the back.  DIAGNOSIS Most of the time, the direct cause of low back pain is not known.However, back pain can be treated effectively even when the exact cause of the pain is unknown.Answering your caregiver's questions about your overall health and symptoms is one of the most accurate ways to make sure the cause of your pain is not dangerous. If your caregiver needs more information, he or she may order lab work or imaging tests (X-rays or MRIs).However, even if imaging tests show changes in your back, this usually does not require surgery. HOME CARE INSTRUCTIONS For many people, back pain returns.Since low back pain is rarely dangerous, it is often a condition that people can learn to [facility] their own.   Remain active. It is stressful on the back to sit or stand in one place. Do not sit, drive, or stand in one place for more than 30 minutes at a time. Take short walks on level surfaces as soon as pain allows.Try to increase the length of time you walk each day.   Do not stay in bed.Resting more than 1 or 2 days can delay your recovery.   Do not avoid exercise or work.Your body is made to move.It is not dangerous to be active, even though your back may hurt.Your back will likely heal faster if you return to being active before your pain is gone.   Pay attention to your body when you bend and lift. Many people have less discomfortwhen lifting if they bend their knees, keep the load close to their  bodies,and avoid twisting. Often, the most comfortable positions are those that put less stress on your recovering back.   Find a comfortable position to sleep. Use a firm mattress and lie on your side with your knees slightly bent. If you lie on your back, put a pillow under your knees.   Only take over-the-counter or prescription medicines as directed by your caregiver. Over-the-counter medicines to reduce pain and inflammation are often the most helpful.Your caregiver may prescribe muscle relaxant drugs.These medicines help dull your pain so you can more quickly return to your normal activities and healthy exercise.   Put ice on the injured area.   Put ice in a plastic bag.   Place a towel between your skin and the bag.   Leave the ice on for 15 to 20 minutes, 3 to 4 times a day for the first 2 to 3 days. After that, ice and heat may be alternated to reduce pain and spasms.   Ask your caregiver about trying back exercises and gentle massage. This may be of some benefit.   Avoid feeling anxious or stressed.Stress increases muscle tension and can worsen back pain.It is important to recognize when you are anxious or stressed and learn ways to manage it.Exercise is a great option.  SEEK MEDICAL CARE IF:  You have pain that is not relieved with rest or medicine.   You have  pain that does not improve in 1 week.   You have new symptoms.   You are generally not feeling well.  SEEK IMMEDIATE MEDICAL CARE IF:   You have pain that radiates from your back into your legs.   You develop new bowel or bladder control problems.   You have unusual weakness or numbness in your arms or legs.   You develop nausea or vomiting.   You develop abdominal pain.   You feel faint.  Document Released: 07/28/2005 Document Revised: 07/17/2011 Document Reviewed: 12/16/2010 [facility] Patient Information 2012 [place], [place].

## 2012-03-02 NOTE — ED Notes (Signed)
To [facility]

## 2012-03-02 NOTE — ED Provider Notes (Signed)
History     CSN: [id number]  Arrival date & time 03/02/12  1337   First MD Initiated Contact with Patient 03/02/12 1908      Chief Complaint  Patient presents with  . Back Pain    (Consider location/radiation/quality/duration/timing/severity/associated sxs/prior treatment) HPI Comments: Patient is brought from [facility] facility due to pain mostly in the left back and left abdomen. She reports the pain however goes all the way around her torso. Is not related to nausea or vomiting. 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 She reports that she does not use drugs or alcohol any longer. She denies any recent trauma, denies any urinary symptoms other than a decrease in overall urine output. Her medication list shows that she takes Tylenol, is on a fentanyl patch, and also is on Vicodin when necessary. She reports that perhaps her oral intake has been a little decreased due to this back pain I asked her about her low blood pressure. She reports that she does not typically have low blood pressure that she is aware of, certainly is not on blood pressure medications. She denies vomiting or diarrhea, blood in her stool. She denies chest pain, shortness of breath, sweats.  Patient is a 51 y.o. female presenting with back pain. The history is provided by the patient.  Back Pain  Associated symptoms include abdominal pain. Pertinent negatives include no chest pain, no fever, no dysuria and no weakness.    Past Medical History  Diagnosis Date  . Diabetes mellitus   . Hypertension     History reviewed. No pertinent past surgical history.  History reviewed. No pertinent family history.  History  Substance Use Topics  . Smoking status: Current Everyday Smoker -- 0.5 packs/day  . Smokeless tobacco: Not on file  . Alcohol Use: No    OB History    Grav Para Term Preterm Abortions TAB SAB Ect Mult Living                    Review of Systems  Constitutional: Positive for appetite change. Negative for fever, chills and diaphoresis.  HENT: Negative for congestion.   Respiratory: Negative for cough, chest tightness and shortness of breath.   Cardiovascular: Negative for chest pain.  Gastrointestinal: Positive for abdominal pain. Negative for nausea, vomiting, diarrhea, constipation and blood in stool.  Genitourinary: Positive for urgency and difficulty urinating. Negative for dysuria.  Musculoskeletal: Positive for back pain.  Neurological: Negative for weakness.  All other systems reviewed and are negative.    Allergies  Review of patient's allergies indicates no known allergies.  Home Medications   Current Outpatient Rx  Name Route Sig Dispense Refill  . ACETAMINOPHEN 325 MG PO TABS Oral Take 325 mg by mouth every 8 (eight) hours as needed. For pain    . ALBUTEROL SULFATE HFA 108 (90 BASE) MCG/ACT IN AERS Inhalation Inhale 2 puffs into the lungs every 4 (four) hours as needed. For shortness of breath    . ALPRAZOLAM 0.5 MG PO TABS Oral Take 0.5 mg by mouth daily as needed. For anxiety    . ALPRAZOLAM 1 MG PO TABS Oral Take 1 mg by mouth 3 (three) times daily.    . ASPIRIN EC 81 MG PO TBEC Oral Take 81 mg by mouth daily.    Marland Kitchen CALCIUM CARBONATE-VITAMIN D 500-200 MG-UNIT PO TABS Oral Take 1 tablet by mouth 2 (two) times daily.    Marland Kitchen  VITAMIN D3 1000 UNITS PO TABS Oral Take 1,000 Units by mouth daily.    Marland Kitchen CIPROFLOXACIN HCL 500 MG PO TABS Oral Take 1 tablet (500 mg total) by mouth 2 (two) times daily. 10 tablet 0  . DOCUSATE SODIUM 100 MG PO CAPS Oral Take 100 mg by mouth 2 (two) times daily.    . DULOXETINE HCL 60 MG PO CPEP Oral Take 60 mg by mouth 2 (two) times daily.    . FENTANYL 25 MCG/HR TD PT72 Transdermal Place 1 patch onto the skin every 3 (three) days.    . OMEGA-3 FATTY ACIDS 1000 MG PO CAPS Oral Take 1 g by mouth daily.    Marland Kitchen FLUTICASONE-SALMETEROL 250-50 MCG/DOSE IN AEPB Inhalation Inhale 1  puff into the lungs every 12 (twelve) hours.    . FUROSEMIDE 20 MG PO TABS Oral Take 20-40 mg by mouth 2 (two) times daily. 2 tabs in the am, 1 tab in the pm    . GABAPENTIN 600 MG PO TABS Oral Take 600 mg by mouth 3 (three) times daily.    Marland Kitchen HYDROCODONE-ACETAMINOPHEN 5-500 MG PO TABS Oral Take 1 tablet by mouth every 6 (six) hours as needed. For pain    . INSULIN ASPART 100 UNIT/ML [place] SOLN Subcutaneous Inject 1-10 Units into the skin 3 (three) times daily before meals. <60 give orange juice & call MD; 60-100=0 units, 101-150=1 units, 151-200=2 units, 201-250=4 units, 251-300=6 units, 301-350=8 units, >350=10 units    . INSULIN GLARGINE 100 UNIT/ML [place] SOLN Subcutaneous Inject 12-16 Units into the skin 2 (two) times daily. 12 units in the am, 16 units at bedtime    . LAMOTRIGINE 100 MG PO TABS Oral Take 100 mg by mouth 2 (two) times daily. Takes with 25 mg tab    . LAMOTRIGINE 25 MG PO TABS Oral Take 25 mg by mouth 2 (two) times daily.    Marland Kitchen LISINOPRIL 40 MG PO TABS Oral Take 40 mg by mouth daily.    . MELOXICAM 15 MG PO TABS Oral Take 15 mg by mouth daily.    Marland Kitchen MIRTAZAPINE 30 MG PO TBDP Oral Take 30 mg by mouth at bedtime.    Marland Kitchen NIACIN ER (ANTIHYPERLIPIDEMIC) 500 MG PO TBCR Oral Take 500 mg by mouth at bedtime.    . OMEPRAZOLE 20 MG PO CPDR Oral Take 20 mg by mouth every evening.    Marland Kitchen POTASSIUM CHLORIDE CRYS ER 20 MEQ PO TBCR Oral Take 20 mEq by mouth 2 (two) times daily.    Marland Kitchen PROMETHAZINE HCL 12.5 MG PO TABS Oral Take 12.5 mg by mouth every 4 (four) hours as needed. For nausea    . QUETIAPINE FUMARATE 100 MG PO TABS Oral Take 100 mg by mouth at bedtime. Takes with 25 mg    . QUETIAPINE FUMARATE 200 MG PO TABS Oral Take 200 mg by mouth at bedtime.    Marland Kitchen QUETIAPINE FUMARATE 25 MG PO TABS Oral Take 25 mg by mouth every morning. Takes with 100 mg    . SIMVASTATIN 40 MG PO TABS Oral Take 40 mg by mouth daily.    Marland Kitchen TIOTROPIUM BROMIDE MONOHYDRATE 18 MCG IN CAPS Inhalation Place 18 mcg into inhaler and  inhale daily.    . VENLAFAXINE HCL 75 MG PO TABS Oral Take 75 mg by mouth every morning.    Marland Kitchen ZOLPIDEM TARTRATE 10 MG PO TABS Oral Take 10 mg by mouth at bedtime.      BP 112/46  Pulse 83  Temp 98.5 F (36.9 C) (Oral)  Resp 19  SpO2 97%  Physical Exam  Vitals reviewed. Constitutional: She appears well-developed and well-nourished.  HENT:  Head: Normocephalic.    ED Course  Procedures (including critical care time)  Labs Reviewed  COMPREHENSIVE METABOLIC PANEL - Abnormal; Notable for the following:    Potassium 5.9 (*)     Glucose, Bld 123 (*)     BUN 39 (*)     Creatinine, Ser 1.38 (*)     AST 47 (*)     Total Bilirubin 0.1 (*)     GFR calc non Af Amer 43 (*)     GFR calc Af Amer 50 (*)     All other components within normal limits  CBC WITH DIFFERENTIAL - Abnormal; Notable for the following:    Lymphs Abs 4.2 (*)     All other components within normal limits  URINALYSIS, ROUTINE W REFLEX MICROSCOPIC - Abnormal; Notable for the following:    APPearance CLOUDY (*)     Leukocytes, UA SMALL (*)     All other components within normal limits  URINE MICROSCOPIC-ADD ON - Abnormal; Notable for the following:    Squamous Epithelial / LPF MANY (*)     All other components within normal limits  LIPASE, BLOOD   Ct Abdomen Pelvis W Contrast  03/02/2012  *RADIOLOGY REPORT*  Clinical Data: Lower back and abdominal pain.  History of pancreatitis.  CT ABDOMEN AND PELVIS WITH CONTRAST  Technique:  Multidetector CT imaging of the abdomen and pelvis was performed following the standard protocol during bolus administration of intravenous contrast.  Contrast: OMNIPAQUE IOHEXOL 300 MG/ML  SOLN  Comparison: 07/24/2010  Findings: Stable mild fatty infiltration of the liver with relative enlargement of the left lobe.  No overt cirrhotic changes or evidence of hepatic masses.  The gallbladder has been removed. There is no evidence of biliary ductal dilatation.  The pancreas, spleen, adrenal  glands and kidneys are within normal limits.  Previously identified mildly prominent periportal and gastrohepatic lymph nodes are stable to slightly smaller in size and are likely stable reactive/benign nodes.  Bowel loops are unremarkable.  No abnormal fluid collections.  No hernias.  The bladder is decompressed.  The uterus and adnexal regions are unremarkable by CT.  No bony abnormalities identified.  IMPRESSION:  1.  Stable mild fatty infiltration of the liver. 2.  Stable mildly prominent upper abdominal lymph nodes which have previously been demonstrated. 3.  No acute findings in the abdomen or pelvis.  Original Report Authenticated By: Reola Calkins, M.D.     1. Back pain   2. Cystitis     CT is ok.  Abx ordered.  Pt has pain meds at [facility].  Pt's BP here seemed low, was most likely a BP cuff error, once repositioned, BP's have been normal.  Pt is able to stand, ambulate, doesn't feel lightheaded or dizzy.  Pt is stable for discharge.    MDM  Pt with vague symptoms.  Mildly hypotensive, I think may be due to multiple narcotics on MAR.  Pt's exam shows benign abd, but due to complaints of pain, age, CT performed which shows no acute abn's.  IVF bolus given, will allow to eat and pt can return to [facility] and follow up with PCP.  Possibly mild UTI with flank pain and has small LE, bacteria.  Will put on Cipro.          Gavin Pound. Kaybree Williams,  MD 03/02/12 2256

## 2012-03-02 NOTE — ED Notes (Signed)
Patient transported to CT 

## 2012-03-02 NOTE — ED Notes (Signed)
Return from ct scan.

## 2012-03-02 NOTE — ED Notes (Signed)
MD at bedside. 

## 2012-03-02 NOTE — ED Notes (Signed)
Contrast complete

## 2012-03-02 NOTE — ED Notes (Signed)
Pt is from [facility], pt here by [facility] for back pain, sts last time this occurred she died and was trached, pt denies injuries

## 2012-03-25 ENCOUNTER — Encounter (HOSPITAL_COMMUNITY): Payer: Self-pay | Admitting: Emergency Medicine

## 2012-03-25 ENCOUNTER — Emergency Department (HOSPITAL_COMMUNITY)
Admission: EM | Admit: 2012-03-25 | Discharge: 2012-03-25 | Disposition: A | Discharge status: Home / Self Care | Payer: Medicaid Other | Attending: Emergency Medicine | Admitting: Emergency Medicine

## 2012-03-25 ENCOUNTER — Emergency Department (HOSPITAL_COMMUNITY): Payer: Medicaid Other

## 2012-03-25 DIAGNOSIS — N390 Urinary tract infection, site not specified: Secondary | ICD-10-CM

## 2012-03-25 DIAGNOSIS — I10 Essential (primary) hypertension: Secondary | ICD-10-CM | POA: Insufficient documentation

## 2012-03-25 DIAGNOSIS — Z794 Long term (current) use of insulin: Secondary | ICD-10-CM | POA: Insufficient documentation

## 2012-03-25 DIAGNOSIS — F429 Obsessive-compulsive disorder, unspecified: Secondary | ICD-10-CM | POA: Insufficient documentation

## 2012-03-25 DIAGNOSIS — M545 Low back pain, unspecified: Secondary | ICD-10-CM

## 2012-03-25 DIAGNOSIS — F319 Bipolar disorder, unspecified: Secondary | ICD-10-CM | POA: Insufficient documentation

## 2012-03-25 DIAGNOSIS — F17200 Nicotine dependence, unspecified, uncomplicated: Secondary | ICD-10-CM | POA: Insufficient documentation

## 2012-03-25 DIAGNOSIS — E119 Type 2 diabetes mellitus without complications: Secondary | ICD-10-CM | POA: Insufficient documentation

## 2012-03-25 DIAGNOSIS — R52 Pain, unspecified: Secondary | ICD-10-CM | POA: Insufficient documentation

## 2012-03-25 DIAGNOSIS — G8929 Other chronic pain: Secondary | ICD-10-CM

## 2012-03-25 LAB — CBC
HCT: 38.2 % (ref 36.0–46.0)
MCH: 32.5 pg (ref 26.0–34.0)
MCV: 94.1 fL (ref 78.0–100.0)
Platelets: 257 10*3/uL (ref 150–400)
RBC: 4.06 MIL/uL (ref 3.87–5.11)

## 2012-03-25 LAB — BASIC METABOLIC PANEL
BUN: 30 mg/dL — ABNORMAL HIGH (ref 6–23)
CO2: 23 mEq/L (ref 19–32)
Calcium: 10.2 mg/dL (ref 8.4–10.5)
Creatinine, Ser: 1.36 mg/dL — ABNORMAL HIGH (ref 0.50–1.10)
Glucose, Bld: 109 mg/dL — ABNORMAL HIGH (ref 70–99)

## 2012-03-25 LAB — URINALYSIS, ROUTINE W REFLEX MICROSCOPIC
Bilirubin Urine: NEGATIVE
Glucose, UA: NEGATIVE mg/dL
Hgb urine dipstick: NEGATIVE
Ketones, ur: NEGATIVE mg/dL
Protein, ur: NEGATIVE mg/dL
pH: 5.5 (ref 5.0–8.0)

## 2012-03-25 LAB — URINE MICROSCOPIC-ADD ON

## 2012-03-25 MED ORDER — SODIUM CHLORIDE 0.9 % IV SOLN: 1000.0000 mL | INTRAVENOUS | Status: DC

## 2012-03-25 MED ORDER — SODIUM CHLORIDE 0.9 % IV SOLN
1000.0000 mL | Freq: Once | INTRAVENOUS | Status: AC
  Administered 2012-03-25: 1000 mL via INTRAVENOUS

## 2012-03-25 MED ORDER — MORPHINE SULFATE 4 MG/ML IJ SOLN
4.0000 mg | Freq: Once | INTRAMUSCULAR | Status: AC
  Administered 2012-03-25: 4 mg via INTRAVENOUS
  Filled 2012-03-25: qty 1

## 2012-03-25 MED ORDER — HYDROCODONE-ACETAMINOPHEN 5-500 MG PO TABS: 1.0000 | ORAL_TABLET | Freq: Three times a day (TID) | ORAL | Status: AC | PRN

## 2012-03-25 MED ORDER — CIPROFLOXACIN HCL 500 MG PO TABS: 500.0000 mg | ORAL_TABLET | Freq: Two times a day (BID) | ORAL | Status: AC

## 2012-03-25 MED ORDER — CIPROFLOXACIN HCL 500 MG PO TABS
500.0000 mg | ORAL_TABLET | Freq: Once | ORAL | Status: AC
  Administered 2012-03-25: 500 mg via ORAL
  Filled 2012-03-25: qty 1

## 2012-03-25 MED ORDER — SODIUM CHLORIDE 0.9 % IV SOLN: 1000.0000 mL | Freq: Once | INTRAVENOUS | Status: DC

## 2012-03-25 NOTE — ED Provider Notes (Signed)
I have supervised the resident on the management of this patient and agree with the note above. I personally interviewed and examined the patient and my addendum is below.   Sarah Leonard is a 51 y.o. female with chronic back and hip pain, bipolar, pancreatitis here with worsening hip pain. She lost her fentanyl patch 2 days ago and the facility refused to give her another one. Patient had increasing pain afterwards. No fall or trauma. She also had lower abdominal pain and some dysuria. No fever. She was recently admitted for pneumonia.   Initial vitals showed hypotension in the 80s. Labs showed WBC 13, CMP showed stable Cr 1.4. CXR was improving. UA showed + leuks.   Patient's pain improved with pain meds. We gave her a script of vicodin on d/c and she will get fentanyl patch in several days. We also d/c her on 7 day course of cipro.   Richardean Canal, MD 03/25/12 2118

## 2012-03-25 NOTE — ED Notes (Signed)
Pt arrives to ED via [facility]. Pt claims multiple pain sites with no relief.  Pt anxious regarding living at [facility] care- states "it's horrible living conditions".  NAD.  Pt uses walker at home.

## 2012-03-25 NOTE — ED Provider Notes (Signed)
History     CSN: [id number]  Arrival date & time 03/25/12  1636   First MD Initiated Contact with Patient 03/25/12 1713      Chief Complaint  Patient presents with  . Back Pain  . Hip Pain    (Consider location/radiation/quality/duration/timing/severity/associated sxs/prior treatment) HPI This is a 51 year old woman a resident at assisted living facility [facility]), with multiple medical conditions including chronic low back pain, pancreatitis, bipolar disorder, manic depressive disorder, OCD, and diabetes, who presents today with right hip pain for 2 days. She is on about 25 medications including Vicodin, Meloxicam and Fentanyl patch. The right hip pain started yesterday morning and she couldn't have her fentanyl patch replaced. She describes the pain as shooting in nature 10/10 when it is severe. It comes in spasms increased by walking and reduced by lying down still. She has no history of falls, and she gets around the house using a walker. She was seen in the ED 3 weeks back with hypotension and pneumonia. Today she does not have a cough, but she reports wheezing. She has no fever or chills, no difficulty in breathing, no chest pain, no exertional dyspnea. She was hysterical during the interview mentioning that she is feeling very sad because she has not seen her son in months. She also reports that in the facility, where she lives the situation is horrible. She was seen by a psychiatrist two days ago and had some of her medicines changed. She is frustrated about this medication change and she believes this might also be contributing to her increased back pain.  Past Medical History  Diagnosis Date  . Diabetes mellitus   . Hypertension     Past Surgical History  Procedure Date  . Cholecystectomy   . Appendectomy   . Tonsillectomy     No family history on file.  History  Substance Use Topics  . Smoking status: Current Everyday Smoker -- 0.5 packs/day  . Smokeless tobacco:  Not on file  . Alcohol Use: No    OB History    Grav Para Term Preterm Abortions TAB SAB Ect Mult Living                  Review of Systems  Constitutional: Negative.   HENT: Positive for neck pain. Negative for ear pain, facial swelling, neck stiffness, tinnitus and ear discharge.   Eyes: Negative.   Respiratory: Positive for cough and wheezing. Negative for apnea, choking, chest tightness and shortness of breath.   Cardiovascular: Negative for palpitations and leg swelling.  Gastrointestinal: Positive for nausea and vomiting. Negative for abdominal pain, diarrhea, constipation and anal bleeding.  Genitourinary: Negative for dysuria, urgency, frequency, hematuria and difficulty urinating.  Musculoskeletal: Positive for back pain and gait problem. Negative for joint swelling.  Neurological: Negative for dizziness, tremors, seizures, syncope, facial asymmetry, speech difficulty, weakness and headaches.  Hematological: Negative.   Psychiatric/Behavioral: Positive for agitation. Negative for hallucinations, behavioral problems, confusion and dysphoric mood.     Allergies  Review of patient's allergies indicates no known allergies.  Home Medications   Current Outpatient Rx  Name Route Sig Dispense Refill  . ACETAMINOPHEN 325 MG PO TABS Oral Take 325 mg by mouth every 8 (eight) hours as needed. For pain    . ALBUTEROL SULFATE HFA 108 (90 BASE) MCG/ACT IN AERS Inhalation Inhale 2 puffs into the lungs every 4 (four) hours as needed. For shortness of breath    . ASPIRIN EC 81 MG PO TBEC  Oral Take 81 mg by mouth daily.    Marland Kitchen CALCIUM CARBONATE-VITAMIN D 500-200 MG-UNIT PO TABS Oral Take 1 tablet by mouth 2 (two) times daily.    Marland Kitchen VITAMIN D3 1000 UNITS PO TABS Oral Take 1,000 Units by mouth daily.    Marland Kitchen CLONAZEPAM 1 MG PO TABS Oral Take 1 mg by mouth 3 (three) times daily as needed. For panic    . DOCUSATE SODIUM 100 MG PO CAPS Oral Take 100 mg by mouth 2 (two) times daily.    . DULOXETINE  HCL 60 MG PO CPEP Oral Take 60 mg by mouth 2 (two) times daily.    . FENTANYL 25 MCG/HR TD PT72 Transdermal Place 1 patch onto the skin every 3 (three) days.    . OMEGA-3 FATTY ACIDS 1000 MG PO CAPS Oral Take 1 g by mouth daily.    Marland Kitchen FLUTICASONE-SALMETEROL 250-50 MCG/DOSE IN AEPB Inhalation Inhale 1 puff into the lungs every 12 (twelve) hours.    . FUROSEMIDE 20 MG PO TABS Oral Take 20-40 mg by mouth 2 (two) times daily. 2 tabs in the am, 1 tab in the pm    . GABAPENTIN 600 MG PO TABS Oral Take 600 mg by mouth 3 (three) times daily.    Marland Kitchen HYDROCODONE-ACETAMINOPHEN 5-325 MG PO TABS Oral Take 1 tablet by mouth every 12 (twelve) hours as needed. For pain    . INSULIN ASPART 100 UNIT/ML [place] SOLN Subcutaneous Inject 1-10 Units into the skin 3 (three) times daily before meals. <60 give orange juice & call MD; 60-100=0 units, 101-150=1 units, 151-200=2 units, 201-250=4 units, 251-300=6 units, 301-350=8 units, >350=10 units    . INSULIN GLARGINE 100 UNIT/ML [place] SOLN Subcutaneous Inject 12-16 Units into the skin 2 (two) times daily. 12 units in the am, 16 units at bedtime    . LAMOTRIGINE 100 MG PO TABS Oral Take 100 mg by mouth 2 (two) times daily. Takes with 25 mg tab    . LAMOTRIGINE 25 MG PO TABS Oral Take 25 mg by mouth 2 (two) times daily.    Marland Kitchen LISINOPRIL 40 MG PO TABS Oral Take 40 mg by mouth daily.    . MELOXICAM 15 MG PO TABS Oral Take 15 mg by mouth daily.    Marland Kitchen MIRTAZAPINE 30 MG PO TBDP Oral Take 30 mg by mouth at bedtime.    Marland Kitchen NIACIN ER (ANTIHYPERLIPIDEMIC) 500 MG PO TBCR Oral Take 500 mg by mouth at bedtime.    . OMEPRAZOLE 20 MG PO CPDR Oral Take 20 mg by mouth every evening.    Marland Kitchen POTASSIUM CHLORIDE CRYS ER 20 MEQ PO TBCR Oral Take 20 mEq by mouth 2 (two) times daily.    Marland Kitchen PROMETHAZINE HCL 12.5 MG PO TABS Oral Take 12.5 mg by mouth every 4 (four) hours as needed. For nausea    . QUETIAPINE FUMARATE 200 MG PO TABS Oral Take 400 mg by mouth at bedtime.     Marland Kitchen SIMVASTATIN 40 MG PO TABS Oral Take 40  mg by mouth daily.    Marland Kitchen TIOTROPIUM BROMIDE MONOHYDRATE 18 MCG IN CAPS Inhalation Place 18 mcg into inhaler and inhale daily.    Marland Kitchen ZOLPIDEM TARTRATE 10 MG PO TABS Oral Take 10 mg by mouth at bedtime.      BP 115/53  Pulse 78  Temp 98.6 F (37 C)  Resp 14  SpO2 97%  Physical Exam  Constitutional: She is oriented to person, place, and time. She appears well-developed and well-nourished.  No distress.  HENT:  Head: Normocephalic and atraumatic.  Eyes: Conjunctivae are normal. Pupils are equal, round, and reactive to light.  Neck: Normal range of motion. Neck supple.  Cardiovascular: Normal rate, regular rhythm, normal heart sounds and intact distal pulses.  Exam reveals no gallop and no friction rub.   No murmur heard. Pulmonary/Chest: Effort normal. She has wheezes.  Abdominal: Soft. Bowel sounds are normal. She exhibits no distension and no mass. There is no tenderness. There is no rebound and no guarding.  Musculoskeletal: She exhibits no edema and no tenderness.       Right hip: She exhibits tenderness. She exhibits normal range of motion, normal strength, no bony tenderness, no swelling, no crepitus, no deformity and no laceration.       Left hip: Normal.       Legs: Neurological: She is oriented to person, place, and time. No cranial nerve deficit. Coordination normal.  Skin: Skin is warm and dry. She is not diaphoretic.    ED Course  Procedures (including critical care time)   Labs Reviewed  CBC  BASIC METABOLIC PANEL   No results found.   No diagnosis found.    MDM  51 year old woman, with chronic low back pain among other multiple medical conditons, comes in with increased back and right hip pain for 2 days which worsened after were changed yesterday. She was yesterday refused a refill of her Fentanyl patch. Physical examination reveals tenderness around the lower back and right hip. She also has wheezing that is generalized bilaterally. Her blood pressures have been  low with a systolic care of or 115. Her systolic has been soft at 53 mm of mercury. We will give [place] some normal saline bolus and pain medicine. We will do a chest x-ray and a urinalysis to rule out a chest infection or a urinary tract infection. I believe her right hip pain is a chronic problem, which has been worsened by a change in her medications. I therefore, don't think that we need to pursue an acute evaluation of this problem with imaging.        Dow Adolph, MD 03/25/12 [phone]

## 2012-03-25 NOTE — ED Notes (Signed)
Pt came from [facility] without walker she uses to ambulate.

## 2012-07-26 ENCOUNTER — Emergency Department (HOSPITAL_COMMUNITY)
Admission: EM | Admit: 2012-07-26 | Discharge: 2012-07-27 | Disposition: A | Discharge status: Home / Self Care | Payer: Medicaid Other | Attending: Emergency Medicine | Admitting: Emergency Medicine

## 2012-07-26 ENCOUNTER — Encounter (HOSPITAL_COMMUNITY): Payer: Self-pay | Admitting: Emergency Medicine

## 2012-07-26 ENCOUNTER — Emergency Department (HOSPITAL_COMMUNITY): Payer: Medicaid Other

## 2012-07-26 DIAGNOSIS — R599 Enlarged lymph nodes, unspecified: Secondary | ICD-10-CM | POA: Insufficient documentation

## 2012-07-26 DIAGNOSIS — Z7982 Long term (current) use of aspirin: Secondary | ICD-10-CM | POA: Insufficient documentation

## 2012-07-26 DIAGNOSIS — Z794 Long term (current) use of insulin: Secondary | ICD-10-CM | POA: Insufficient documentation

## 2012-07-26 DIAGNOSIS — Z791 Long term (current) use of non-steroidal anti-inflammatories (NSAID): Secondary | ICD-10-CM | POA: Insufficient documentation

## 2012-07-26 DIAGNOSIS — I10 Essential (primary) hypertension: Secondary | ICD-10-CM | POA: Insufficient documentation

## 2012-07-26 DIAGNOSIS — E119 Type 2 diabetes mellitus without complications: Secondary | ICD-10-CM | POA: Insufficient documentation

## 2012-07-26 DIAGNOSIS — F17200 Nicotine dependence, unspecified, uncomplicated: Secondary | ICD-10-CM | POA: Insufficient documentation

## 2012-07-26 DIAGNOSIS — K861 Other chronic pancreatitis: Secondary | ICD-10-CM | POA: Insufficient documentation

## 2012-07-26 DIAGNOSIS — Z79899 Other long term (current) drug therapy: Secondary | ICD-10-CM | POA: Insufficient documentation

## 2012-07-26 HISTORY — DX: Acute pancreatitis without necrosis or infection, unspecified: K85.90

## 2012-07-26 LAB — COMPREHENSIVE METABOLIC PANEL
AST: 20 U/L (ref 0–37)
Albumin: 4.1 g/dL (ref 3.5–5.2)
Alkaline Phosphatase: 107 U/L (ref 39–117)
BUN: 15 mg/dL (ref 6–23)
CO2: 25 mEq/L (ref 19–32)
Chloride: 97 mEq/L (ref 96–112)
Creatinine, Ser: 0.75 mg/dL (ref 0.50–1.10)
GFR calc non Af Amer: >90 mL/min (ref >90)
Potassium: 4.2 mEq/L (ref 3.5–5.1)
Total Bilirubin: 0.3 mg/dL (ref 0.3–1.2)

## 2012-07-26 LAB — CBC WITH DIFFERENTIAL/PLATELET
Basophils Absolute: 0 10*3/uL (ref 0.0–0.1)
Basophils Relative: 0 % (ref 0–1)
HCT: 41.6 % (ref 36.0–46.0)
Hemoglobin: 14.7 g/dL (ref 12.0–15.0)
Lymphocytes Relative: 37 % (ref 12–46)
Monocytes Absolute: 0.5 10*3/uL (ref 0.1–1.0)
Monocytes Relative: 5 % (ref 3–12)
Neutro Abs: 5.8 10*3/uL (ref 1.7–7.7)
Neutrophils Relative %: 56 % (ref 43–77)
WBC: 10.4 10*3/uL (ref 4.0–10.5)

## 2012-07-26 LAB — GLUCOSE, CAPILLARY: Glucose-Capillary: 134 mg/dL — ABNORMAL HIGH (ref 70–99)

## 2012-07-26 MED ORDER — SODIUM CHLORIDE 0.9 % IV SOLN
Freq: Once | INTRAVENOUS | Status: AC
  Administered 2012-07-26: 21:00:00 via INTRAVENOUS

## 2012-07-26 MED ORDER — INSULIN GLARGINE 100 UNIT/ML ~~LOC~~ SOLN: 12.0000 [IU] | Freq: Two times a day (BID) | SUBCUTANEOUS | Status: DC

## 2012-07-26 MED ORDER — LORAZEPAM 1 MG PO TABS: 1.0000 mg | ORAL_TABLET | Freq: Three times a day (TID) | ORAL | Status: DC

## 2012-07-26 MED ORDER — LAMOTRIGINE 25 MG PO TABS
25.0000 mg | ORAL_TABLET | Freq: Two times a day (BID) | ORAL | Status: DC
  Administered 2012-07-26: 25 mg via ORAL
  Filled 2012-07-26: qty 1

## 2012-07-26 MED ORDER — IOHEXOL 300 MG/ML  SOLN
100.0000 mL | Freq: Once | INTRAMUSCULAR | Status: AC | PRN
  Administered 2012-07-26: 100 mL via INTRAVENOUS

## 2012-07-26 MED ORDER — GABAPENTIN 600 MG PO TABS
600.0000 mg | ORAL_TABLET | Freq: Three times a day (TID) | ORAL | Status: DC
  Administered 2012-07-26: 600 mg via ORAL
  Filled 2012-07-26: qty 1

## 2012-07-26 MED ORDER — DULOXETINE HCL 60 MG PO CPEP
60.0000 mg | ORAL_CAPSULE | Freq: Two times a day (BID) | ORAL | Status: DC
  Administered 2012-07-26: 60 mg via ORAL
  Filled 2012-07-26: qty 1

## 2012-07-26 MED ORDER — HYDROMORPHONE HCL PF 1 MG/ML IJ SOLN
1.0000 mg | Freq: Once | INTRAMUSCULAR | Status: AC
  Administered 2012-07-26: 1 mg via INTRAVENOUS
  Filled 2012-07-26: qty 1

## 2012-07-26 MED ORDER — ONDANSETRON HCL 4 MG/2ML IJ SOLN
4.0000 mg | Freq: Once | INTRAMUSCULAR | Status: AC
  Administered 2012-07-26: 4 mg via INTRAVENOUS
  Filled 2012-07-26: qty 2

## 2012-07-26 MED ORDER — IOHEXOL 300 MG/ML  SOLN
20.0000 mL | INTRAMUSCULAR | Status: AC
  Administered 2012-07-26 (×2): 20 mL via ORAL

## 2012-07-26 NOTE — ED Provider Notes (Addendum)
History     CSN: [id number]  Arrival date & time 07/26/12  1245   First MD Initiated Contact with Patient 07/26/12 1958      Chief Complaint  Patient presents with  . Abdominal Pain    (Consider location/radiation/quality/duration/timing/severity/associated sxs/prior treatment) HPI Comments: Assisted living and 2 months ago noticed a small mass in upper abdand since then it has gotten bigger and now it hurts  Move and sit up tender touch no bruising pain radaites both ways across abd.   The history is provided by the patient.    Past Medical History  Diagnosis Date  . Diabetes mellitus   . Hypertension   . Pancreatitis     Past Surgical History  Procedure Date  . Cholecystectomy   . Appendectomy   . Tonsillectomy     No family history on file.  History  Substance Use Topics  . Smoking status: Current Every Day Smoker -- 0.5 packs/day  . Smokeless tobacco: Not on file  . Alcohol Use: No    OB History    Grav Para Term Preterm Abortions TAB SAB Ect Mult Living                  Review of Systems  HENT: Negative.   Eyes: Negative.   Respiratory: Negative.   Cardiovascular: Negative.   Gastrointestinal: Positive for abdominal pain. Negative for diarrhea and abdominal distention.  Neurological: Negative.  Negative for numbness.    Allergies  Review of patient's allergies indicates no known allergies.  Home Medications   Current Outpatient Rx  Name  Route  Sig  Dispense  Refill  . ACETAMINOPHEN 325 MG PO TABS   Oral   Take 325 mg by mouth every 8 (eight) hours as needed. For pain         . ALBUTEROL SULFATE HFA 108 (90 BASE) MCG/ACT IN AERS   Inhalation   Inhale 2 puffs into the lungs every 4 (four) hours as needed. For shortness of breath         . ASPIRIN EC 81 MG PO TBEC   Oral   Take 81 mg by mouth daily.         Marland Kitchen CALCIUM CARBONATE-VITAMIN D 500-200 MG-UNIT PO TABS   Oral   Take 1 tablet by mouth 2 (two) times daily.         Marland Kitchen  VITAMIN D3 1000 UNITS PO TABS   Oral   Take 1,000 Units by mouth daily.         Marland Kitchen DOCUSATE SODIUM 100 MG PO CAPS   Oral   Take 100 mg by mouth 2 (two) times daily.         . DULOXETINE HCL 60 MG PO CPEP   Oral   Take 60 mg by mouth 2 (two) times daily.         Marland Kitchen FLUTICASONE-SALMETEROL 250-50 MCG/DOSE IN AEPB   Inhalation   Inhale 1 puff into the lungs every 12 (twelve) hours.         . FUROSEMIDE 20 MG PO TABS   Oral   Take 20-40 mg by mouth 2 (two) times daily. Take two tablets in the morning and one tablet in the evening         . GABAPENTIN 600 MG PO TABS   Oral   Take 600 mg by mouth 3 (three) times daily.         Marland Kitchen HYDROCODONE-ACETAMINOPHEN 5-325 MG PO TABS   Oral  Take 1 tablet by mouth every 12 (twelve) hours as needed. For pain         . INSULIN ASPART 100 UNIT/ML [place] SOLN   Subcutaneous   Inject 1-10 Units into the skin 3 (three) times daily before meals. <60 give orange juice & call MD; 60-100=0 units, 101-150=1 units, 151-200=2 units, 201-250=4 units, 251-300=6 units, 301-350=8 units, >350=10 units         . INSULIN GLARGINE 100 UNIT/ML [place] SOLN   Subcutaneous   Inject 12-16 Units into the skin 2 (two) times daily. 12 units in the am, 16 units at bedtime         . LAMOTRIGINE 100 MG PO TABS   Oral   Take 100 mg by mouth 2 (two) times daily. Takes with 25 mg tab         . LAMOTRIGINE 25 MG PO TABS   Oral   Take 25 mg by mouth 2 (two) times daily.         Marland Kitchen LISINOPRIL 40 MG PO TABS   Oral   Take 40 mg by mouth daily.         Marland Kitchen LORAZEPAM 1 MG PO TABS   Oral   Take 1 mg by mouth 3 (three) times daily.         . MELOXICAM 15 MG PO TABS   Oral   Take 15 mg by mouth daily.         Marland Kitchen NIACIN ER (ANTIHYPERLIPIDEMIC) 500 MG PO TBCR   Oral   Take 500 mg by mouth at bedtime.         . OMEPRAZOLE 20 MG PO CPDR   Oral   Take 20 mg by mouth every evening.         Marland Kitchen POTASSIUM CHLORIDE CRYS ER 20 MEQ PO TBCR   Oral   Take 20  mEq by mouth 2 (two) times daily.         Marland Kitchen PROMETHAZINE HCL 12.5 MG PO TABS   Oral   Take 12.5 mg by mouth every 4 (four) hours as needed. For nausea         . QUETIAPINE FUMARATE ER 300 MG PO TB24   Oral   Take 600 mg by mouth every evening.         Marland Kitchen SIMVASTATIN 40 MG PO TABS   Oral   Take 40 mg by mouth daily.         Marland Kitchen TIOTROPIUM BROMIDE MONOHYDRATE 18 MCG IN CAPS   Inhalation   Place 18 mcg into inhaler and inhale daily.         Marland Kitchen ZOLPIDEM TARTRATE 10 MG PO TABS   Oral   Take 5 mg by mouth at bedtime.            BP 138/84  Pulse 74  Temp 97.5 F (36.4 C) (Oral)  Resp 20  SpO2 99%  Physical Exam  Constitutional: She appears well-developed and well-nourished.  Neck: Normal range of motion.  Pulmonary/Chest: Effort normal.  Abdominal: Soft. She exhibits no distension. There is tenderness.       Midline tenderness.  No hernia present.  Discoloration noted  Musculoskeletal: Normal range of motion.  Skin: Skin is warm and dry. No erythema.    ED Course  Procedures (including critical care time)  Labs Reviewed  COMPREHENSIVE METABOLIC PANEL - Abnormal; Notable for the following:    Glucose, Bld 178 (*)     All other components within  normal limits  LIPASE, BLOOD - Abnormal; Notable for the following:    Lipase 88 (*)     All other components within normal limits  GLUCOSE, CAPILLARY - Abnormal; Notable for the following:    Glucose-Capillary 134 (*)     All other components within normal limits  CBC WITH DIFFERENTIAL  LAB REPORT - SCANNED   No results found.   1. Lymph node enlargement       MDM  CT Scan and labs reviewed          Arman Filter, NP 07/26/12 2254  Arman Filter, NP 08/01/12 1610  Arman Filter, NP 08/01/12 9604  Arman Filter, NP 08/19/12 [phone]

## 2012-07-26 NOTE — ED Notes (Signed)
Pt is from [facility]  Assisted living and 2 months ago noticed a small mass in upper abdand since then it has gotten bigger and now it hurts  Move and sit up tender touch no bruising pain radaites both ways across abd. Has not seen a dr  At al for this mass  Some nausea and vomiting with this and she 1 episode of bloody diarrhea on dec 4th but none since then

## 2012-08-03 NOTE — ED Provider Notes (Signed)
Medical screening examination/treatment/procedure(s) were performed by non-physician practitioner and as supervising physician I was immediately available for consultation/collaboration.   Donya Tomaro, MD 08/03/12 0419 

## 2012-08-19 NOTE — ED Provider Notes (Signed)
Medical screening examination/treatment/procedure(s) were performed by non-physician practitioner and as supervising physician I was immediately available for consultation/collaboration.   Joya Gaskins, MD 08/19/12 [phone]

## 2012-08-21 ENCOUNTER — Encounter (HOSPITAL_COMMUNITY): Payer: Self-pay | Admitting: Emergency Medicine

## 2012-08-21 ENCOUNTER — Emergency Department (HOSPITAL_COMMUNITY): Payer: Medicaid Other

## 2012-08-21 ENCOUNTER — Emergency Department (HOSPITAL_COMMUNITY)
Admission: EM | Admit: 2012-08-21 | Discharge: 2012-08-22 | Disposition: A | Discharge status: Home / Self Care | Payer: Medicaid Other | Attending: Emergency Medicine | Admitting: Emergency Medicine

## 2012-08-21 DIAGNOSIS — F17200 Nicotine dependence, unspecified, uncomplicated: Secondary | ICD-10-CM | POA: Insufficient documentation

## 2012-08-21 DIAGNOSIS — E119 Type 2 diabetes mellitus without complications: Secondary | ICD-10-CM | POA: Insufficient documentation

## 2012-08-21 DIAGNOSIS — J441 Chronic obstructive pulmonary disease with (acute) exacerbation: Secondary | ICD-10-CM

## 2012-08-21 DIAGNOSIS — Z79899 Other long term (current) drug therapy: Secondary | ICD-10-CM | POA: Insufficient documentation

## 2012-08-21 DIAGNOSIS — Z794 Long term (current) use of insulin: Secondary | ICD-10-CM | POA: Insufficient documentation

## 2012-08-21 DIAGNOSIS — J3489 Other specified disorders of nose and nasal sinuses: Secondary | ICD-10-CM | POA: Insufficient documentation

## 2012-08-21 DIAGNOSIS — R059 Cough, unspecified: Secondary | ICD-10-CM | POA: Insufficient documentation

## 2012-08-21 DIAGNOSIS — K439 Ventral hernia without obstruction or gangrene: Secondary | ICD-10-CM | POA: Insufficient documentation

## 2012-08-21 DIAGNOSIS — IMO0002 Reserved for concepts with insufficient information to code with codable children: Secondary | ICD-10-CM | POA: Insufficient documentation

## 2012-08-21 DIAGNOSIS — R05 Cough: Secondary | ICD-10-CM

## 2012-08-21 DIAGNOSIS — Z7982 Long term (current) use of aspirin: Secondary | ICD-10-CM | POA: Insufficient documentation

## 2012-08-21 DIAGNOSIS — R109 Unspecified abdominal pain: Secondary | ICD-10-CM | POA: Insufficient documentation

## 2012-08-21 DIAGNOSIS — R0682 Tachypnea, not elsewhere classified: Secondary | ICD-10-CM | POA: Insufficient documentation

## 2012-08-21 DIAGNOSIS — Z8719 Personal history of other diseases of the digestive system: Secondary | ICD-10-CM | POA: Insufficient documentation

## 2012-08-21 DIAGNOSIS — I10 Essential (primary) hypertension: Secondary | ICD-10-CM | POA: Insufficient documentation

## 2012-08-21 LAB — CBC WITH DIFFERENTIAL/PLATELET
Basophils Absolute: 0.1 10*3/uL (ref 0.0–0.1)
Basophils Relative: 1 % (ref 0–1)
MCHC: 34.2 g/dL (ref 30.0–36.0)
Neutro Abs: 7.6 10*3/uL (ref 1.7–7.7)
Neutrophils Relative %: 61 % (ref 43–77)
Platelets: 283 10*3/uL (ref 150–400)
RDW: 13 % (ref 11.5–15.5)

## 2012-08-21 LAB — COMPREHENSIVE METABOLIC PANEL
AST: 22 U/L (ref 0–37)
Albumin: 3.5 g/dL (ref 3.5–5.2)
Alkaline Phosphatase: 108 U/L (ref 39–117)
Chloride: 97 mEq/L (ref 96–112)
Potassium: 4.4 mEq/L (ref 3.5–5.1)
Total Bilirubin: 0.2 mg/dL — ABNORMAL LOW (ref 0.3–1.2)

## 2012-08-21 MED ORDER — ALBUTEROL SULFATE (5 MG/ML) 0.5% IN NEBU
2.5000 mg | INHALATION_SOLUTION | RESPIRATORY_TRACT | Status: AC
  Administered 2012-08-21 (×2): 2.5 mg via RESPIRATORY_TRACT
  Filled 2012-08-21 (×2): qty 20

## 2012-08-21 MED ORDER — BENZONATATE 200 MG PO CAPS: 100.0000 mg | ORAL_CAPSULE | Freq: Two times a day (BID) | ORAL | Status: DC | PRN

## 2012-08-21 MED ORDER — METHYLPREDNISOLONE SODIUM SUCC 125 MG IJ SOLR
125.0000 mg | Freq: Once | INTRAMUSCULAR | Status: AC
  Administered 2012-08-21: 125 mg via INTRAVENOUS
  Filled 2012-08-21: qty 2

## 2012-08-21 MED ORDER — DOCUSATE SODIUM 100 MG PO CAPS: 100.0000 mg | ORAL_CAPSULE | Freq: Two times a day (BID) | ORAL | Status: DC

## 2012-08-21 MED ORDER — PREDNISONE 20 MG PO TABS: ORAL_TABLET | ORAL | Status: DC

## 2012-08-21 MED ORDER — OXYCODONE-ACETAMINOPHEN 5-325 MG PO TABS
2.0000 | ORAL_TABLET | Freq: Once | ORAL | Status: AC
  Administered 2012-08-21: 2 via ORAL
  Filled 2012-08-21: qty 2

## 2012-08-21 MED ORDER — BENZONATATE 100 MG PO CAPS
200.0000 mg | ORAL_CAPSULE | Freq: Once | ORAL | Status: AC
  Administered 2012-08-21: 200 mg via ORAL
  Filled 2012-08-21: qty 2

## 2012-08-21 MED ORDER — ALBUTEROL SULFATE HFA 108 (90 BASE) MCG/ACT IN AERS: 2.0000 | INHALATION_SPRAY | RESPIRATORY_TRACT | Status: DC | PRN

## 2012-08-21 MED ORDER — OXYCODONE-ACETAMINOPHEN 5-325 MG PO TABS: 1.0000 | ORAL_TABLET | Freq: Four times a day (QID) | ORAL | Status: DC | PRN

## 2012-08-21 MED ORDER — IPRATROPIUM BROMIDE 0.02 % IN SOLN
0.5000 mg | RESPIRATORY_TRACT | Status: AC
  Administered 2012-08-21 (×2): 0.5 mg via RESPIRATORY_TRACT
  Filled 2012-08-21 (×2): qty 2.5

## 2012-08-21 MED ORDER — DOXYCYCLINE HYCLATE 100 MG PO CAPS: 100.0000 mg | ORAL_CAPSULE | Freq: Two times a day (BID) | ORAL | Status: DC

## 2012-08-21 NOTE — ED Provider Notes (Signed)
I reviewed the resident's note and I agree with the findings and plan.     Nelia Shi, MD 08/21/12 [phone]

## 2012-08-21 NOTE — ED Notes (Signed)
Pt arrive to ed via [facility] c/o SOB and Bilat uppe abd pain- onset 1 week. n pt placed on 2l [place]. Sat 96%.  Pt denies n/v/d.  nad-

## 2012-08-21 NOTE — ED Provider Notes (Signed)
History     CSN: [id number]  Arrival date & time 08/21/12  1853   None     Chief Complaint  Patient presents with  . Shortness of Breath     HPI chief complaint cough and shortness of breath. Onset: One week ago. Location: Chest. Not improved or worsened by anything. Severity: Moderate. Timing: Intermittent. For signs and symptoms to review of systems. He regarding social history see nurse's notes. Regarding family history see nurse's notes. I have reviewed patient's past medical, past surgical, past social history as well as medications and allergies.  Past Medical History  Diagnosis Date  . Diabetes mellitus   . Hypertension   . Pancreatitis     Past Surgical History  Procedure Date  . Cholecystectomy   . Appendectomy   . Tonsillectomy     History reviewed. No pertinent family history.  History  Substance Use Topics  . Smoking status: Current Every Day Smoker -- 0.5 packs/day  . Smokeless tobacco: Not on file  . Alcohol Use: No    OB History    Grav Para Term Preterm Abortions TAB SAB Ect Mult Living                  Review of Systems  Constitutional: Negative for fever and chills.  HENT: Positive for congestion and rhinorrhea. Negative for ear pain, sore throat, facial swelling, mouth sores, trouble swallowing, neck pain, neck stiffness, sinus pressure and ear discharge.   Eyes: Negative for pain, discharge and itching.  Respiratory: Positive for cough and shortness of breath. Negative for chest tightness.   Cardiovascular: Negative for chest pain, palpitations and leg swelling.  Gastrointestinal: Positive for abdominal pain. Negative for nausea, vomiting, diarrhea, constipation, blood in stool and abdominal distention.  Genitourinary: Negative for dysuria, urgency, frequency, hematuria, flank pain, decreased urine volume, difficulty urinating and pelvic pain.  Musculoskeletal: Negative for back pain and joint swelling.  Skin: Negative for rash and wound.    Neurological: Negative for dizziness, tremors, seizures, syncope, facial asymmetry, speech difficulty, weakness, light-headedness, numbness and headaches.  Hematological: Negative for adenopathy. Does not bruise/bleed easily.  Psychiatric/Behavioral: Negative for confusion and decreased concentration.    Allergies  Review of patient's allergies indicates no known allergies.  Home Medications   Current Outpatient Rx  Name  Route  Sig  Dispense  Refill  . ACETAMINOPHEN 325 MG PO TABS   Oral   Take 325 mg by mouth every 8 (eight) hours as needed. For pain         . ALBUTEROL SULFATE HFA 108 (90 BASE) MCG/ACT IN AERS   Inhalation   Inhale 2 puffs into the lungs every 4 (four) hours as needed. For shortness of breath         . ASPIRIN EC 81 MG PO TBEC   Oral   Take 81 mg by mouth daily.         Marland Kitchen CALCIUM CARBONATE-VITAMIN D 500-200 MG-UNIT PO TABS   Oral   Take 1 tablet by mouth 2 (two) times daily.         Marland Kitchen VITAMIN D3 1000 UNITS PO TABS   Oral   Take 1,000 Units by mouth daily.         Marland Kitchen DOCUSATE SODIUM 100 MG PO CAPS   Oral   Take 100 mg by mouth 2 (two) times daily.         . DULOXETINE HCL 60 MG PO CPEP   Oral   Take 60  mg by mouth 2 (two) times daily.         Marland Kitchen FLUTICASONE-SALMETEROL 250-50 MCG/DOSE IN AEPB   Inhalation   Inhale 1 puff into the lungs every 12 (twelve) hours.         . FUROSEMIDE 20 MG PO TABS   Oral   Take 20-40 mg by mouth 2 (two) times daily. Take two tablets in the morning and one tablet in the evening         . GABAPENTIN 600 MG PO TABS   Oral   Take 600 mg by mouth 3 (three) times daily.         Marland Kitchen HYDROCODONE-ACETAMINOPHEN 5-325 MG PO TABS   Oral   Take 1 tablet by mouth every 12 (twelve) hours as needed. For pain         . INSULIN ASPART 100 UNIT/ML [place] SOLN   Subcutaneous   Inject 1-10 Units into the skin 3 (three) times daily before meals. <60 give orange juice & call MD; 60-100=0 units, 101-150=1 units,  151-200=2 units, 201-250=4 units, 251-300=6 units, 301-350=8 units, >350=10 units         . INSULIN GLARGINE 100 UNIT/ML [place] SOLN   Subcutaneous   Inject 12-16 Units into the skin 2 (two) times daily. 12 units in the am, 16 units at bedtime         . LAMOTRIGINE 100 MG PO TABS   Oral   Take 100 mg by mouth 2 (two) times daily. Takes with 25 mg tab         . LAMOTRIGINE 25 MG PO TABS   Oral   Take 25 mg by mouth 2 (two) times daily.         Marland Kitchen LISINOPRIL 40 MG PO TABS   Oral   Take 40 mg by mouth daily.         Marland Kitchen LORAZEPAM 1 MG PO TABS   Oral   Take 1 mg by mouth 3 (three) times daily.         . MELOXICAM 15 MG PO TABS   Oral   Take 15 mg by mouth daily.         Marland Kitchen NIACIN ER (ANTIHYPERLIPIDEMIC) 500 MG PO TBCR   Oral   Take 500 mg by mouth at bedtime.         . OMEPRAZOLE 20 MG PO CPDR   Oral   Take 20 mg by mouth every evening.         Marland Kitchen POTASSIUM CHLORIDE CRYS ER 20 MEQ PO TBCR   Oral   Take 20 mEq by mouth 2 (two) times daily.         Marland Kitchen PROMETHAZINE HCL 12.5 MG PO TABS   Oral   Take 12.5 mg by mouth every 4 (four) hours as needed. For nausea         . QUETIAPINE FUMARATE ER 300 MG PO TB24   Oral   Take 600 mg by mouth every evening.         Marland Kitchen SIMVASTATIN 40 MG PO TABS   Oral   Take 40 mg by mouth daily.         Marland Kitchen TIOTROPIUM BROMIDE MONOHYDRATE 18 MCG IN CAPS   Inhalation   Place 18 mcg into inhaler and inhale daily.         Marland Kitchen ZOLPIDEM TARTRATE 10 MG PO TABS   Oral   Take 5 mg by mouth at bedtime.  BP 119/63  Pulse 103  Temp 98.4 F (36.9 C) (Oral)  Resp 26  SpO2 95%  Physical Exam  Constitutional: She is oriented to person, place, and time. She appears well-developed and well-nourished. No distress.  HENT:  Head: Normocephalic and atraumatic.  Eyes: Conjunctivae normal are normal. Right eye exhibits no discharge. Left eye exhibits no discharge. No scleral icterus.  Neck: Normal range of motion. Neck supple.   Cardiovascular: Normal rate, regular rhythm, normal heart sounds and intact distal pulses.   No murmur heard. Pulmonary/Chest: No accessory muscle usage. Tachypnea noted. No respiratory distress. She has decreased breath sounds in the right upper field, the right middle field, the right lower field, the left upper field and the left middle field. She has wheezes in the right upper field, the right middle field, the right lower field, the left upper field, the left middle field and the left lower field. She has no rales. She exhibits tenderness.       Mild tachypnea.  Abdominal: Soft. Bowel sounds are normal. She exhibits no distension and no mass. There is no hepatosplenomegaly. There is tenderness. There is no rigidity, no rebound, no guarding, no CVA tenderness, no tenderness at McBurney's point and negative Murphy's sign. A hernia is present. Hernia confirmed positive in the ventral area.    Musculoskeletal: Normal range of motion. She exhibits no tenderness.  Neurological: She is alert and oriented to person, place, and time.  Skin: Skin is warm and dry. She is not diaphoretic.  Psychiatric: She has a normal mood and affect.    ED Course  Procedures (including critical care time)  Labs Reviewed  CBC WITH DIFFERENTIAL - Abnormal; Notable for the following:    WBC 12.4 (*)     All other components within normal limits  COMPREHENSIVE METABOLIC PANEL - Abnormal; Notable for the following:    Glucose, Bld 134 (*)     Total Bilirubin 0.2 (*)     All other components within normal limits  LIPASE, BLOOD   Dg Chest 2 View  08/21/2012  *RADIOLOGY REPORT*  Clinical Data: Shortness of breath, chest pain.  CHEST - 2 VIEW  Comparison: 07/26/2012  Findings: Diffuse interstitial prominence throughout the lungs, stable since prior study.  Heart is normal size.  No confluent opacities or effusions.  No acute bony abnormality.  Degenerative changes in the thoracic spine.  IMPRESSION: Stable chronic  interstitial lung disease.   Original Report Authenticated By: Charlett Nose, M.D.      1. COPD exacerbation   2. Nonproductive cough      EKG reviewed and interpreted: Sinus rhythm rate 80 to. Normal axis. Normal intervals. Nonspecific T wave flattening in aVL. No ST segment changes. No prior EKGs available for comparison.  MDM  Patient is a 52 year old female presenting with one week of nonproductive cough, shortness of breath, wheezing and abdominal pain more specifically described as hernia pain. Patient has a history of a ventral upper abdominal wall hernia and with the frequent coughing this week she states that is aggravated her chronic hernia pain. Patient had flulike symptoms earlier in the week but now just presents with a cough and shortness of breath. Vital stable. Afebrile. No tachycardia at time of exam. Patient has a soft self reducing ventral hernia which is the site of her pain. Mild upper abdominal and lower chest pain to palpation. I feel this is most likely secondary to muscle irritation costochondritis and muscle strain from the frequent coughing. No vomiting or  diarrhea. No fevers. Imaging not felt to be necessary of the abdomen or pelvis at this time. Breathing treatments ordered.  Chest x-ray unremarkable. Steroids provided. Patient is dramatically improved after 1 duoneb. Will plan for discharge after second treatment. Likely COPD exacerbation. We'll discharge with steroids and continue breathing treatments at home as well as doxy given pt is a smoker. Patient instructed to closely monitor her blood sugars given she will be on steroids.        Consuello Masse, MD 08/21/12 [phone]

## 2012-12-13 ENCOUNTER — Emergency Department (HOSPITAL_COMMUNITY)
Admission: EM | Admit: 2012-12-13 | Discharge: 2012-12-13 | Disposition: A | Discharge status: Home / Self Care | Payer: Medicaid Other | Attending: Emergency Medicine | Admitting: Emergency Medicine

## 2012-12-13 ENCOUNTER — Encounter (HOSPITAL_COMMUNITY): Payer: Self-pay | Admitting: Cardiology

## 2012-12-13 ENCOUNTER — Emergency Department (HOSPITAL_COMMUNITY): Payer: Medicaid Other

## 2012-12-13 DIAGNOSIS — H00036 Abscess of eyelid left eye, unspecified eyelid: Secondary | ICD-10-CM

## 2012-12-13 DIAGNOSIS — I10 Essential (primary) hypertension: Secondary | ICD-10-CM | POA: Insufficient documentation

## 2012-12-13 DIAGNOSIS — Z79899 Other long term (current) drug therapy: Secondary | ICD-10-CM | POA: Insufficient documentation

## 2012-12-13 DIAGNOSIS — H00039 Abscess of eyelid unspecified eye, unspecified eyelid: Secondary | ICD-10-CM | POA: Insufficient documentation

## 2012-12-13 DIAGNOSIS — Z8719 Personal history of other diseases of the digestive system: Secondary | ICD-10-CM | POA: Insufficient documentation

## 2012-12-13 DIAGNOSIS — F17200 Nicotine dependence, unspecified, uncomplicated: Secondary | ICD-10-CM | POA: Insufficient documentation

## 2012-12-13 DIAGNOSIS — E119 Type 2 diabetes mellitus without complications: Secondary | ICD-10-CM | POA: Insufficient documentation

## 2012-12-13 LAB — POCT I-STAT, CHEM 8
BUN: 18 mg/dL (ref 6–23)
Calcium, Ion: 1.24 mmol/L — ABNORMAL HIGH (ref 1.12–1.23)
Chloride: 105 mEq/L (ref 96–112)
Creatinine, Ser: 0.8 mg/dL (ref 0.50–1.10)
Sodium: 139 mEq/L (ref 135–145)
TCO2: 26 mmol/L (ref 0–100)

## 2012-12-13 MED ORDER — OXYCODONE-ACETAMINOPHEN 5-325 MG PO TABS
2.0000 | ORAL_TABLET | Freq: Once | ORAL | Status: AC
  Administered 2012-12-13: 2 via ORAL
  Filled 2012-12-13: qty 2

## 2012-12-13 MED ORDER — LORAZEPAM 1 MG PO TABS
1.0000 mg | ORAL_TABLET | Freq: Once | ORAL | Status: AC
  Administered 2012-12-13: 1 mg via ORAL
  Filled 2012-12-13: qty 1

## 2012-12-13 MED ORDER — TETRACAINE HCL 0.5 % OP SOLN
2.0000 [drp] | Freq: Once | OPHTHALMIC | Status: AC
  Administered 2012-12-13: 2 [drp] via OPHTHALMIC
  Filled 2012-12-13: qty 2

## 2012-12-13 MED ORDER — AMOXICILLIN-POT CLAVULANATE 875-125 MG PO TABS: 1.0000 | ORAL_TABLET | Freq: Two times a day (BID) | ORAL | Status: DC

## 2012-12-13 MED ORDER — PIPERACILLIN-TAZOBACTAM 3.375 G IVPB
3.3750 g | Freq: Once | INTRAVENOUS | Status: AC
  Administered 2012-12-13: 3.375 g via INTRAVENOUS
  Filled 2012-12-13: qty 50

## 2012-12-13 MED ORDER — OXYCODONE-ACETAMINOPHEN 5-325 MG PO TABS: 1.0000 | ORAL_TABLET | Freq: Four times a day (QID) | ORAL | Status: DC | PRN

## 2012-12-13 MED ORDER — IOHEXOL 300 MG/ML  SOLN
80.0000 mL | Freq: Once | INTRAMUSCULAR | Status: AC | PRN
  Administered 2012-12-13: 100 mL via INTRAVENOUS

## 2012-12-13 NOTE — ED Provider Notes (Signed)
Medical screening examination/treatment/procedure(s) were conducted as a shared visit with non-physician practitioner(s) and myself.  I personally evaluated the patient during the encounter  On exam pt has mild erythema around her eye as well as conjunctival edema.  No injury.  She does not wear contacts.  Well check visual acuity, ocular pressures.  CT scan of ortbits to evaluate for pre septal cellulitis  Celene Kras, MD 12/13/12 1322

## 2012-12-13 NOTE — ED Notes (Signed)
Pt c/o left eye pain. Pt has swelling to left eye, reports it is tender to touch. Pt in nad, skin warm and dry, resp e/u.

## 2012-12-13 NOTE — ED Provider Notes (Signed)
History     CSN: [id number]  Arrival date & time 12/13/12  1154   First MD Initiated Contact with Patient 12/13/12 1302      Chief Complaint  Patient presents with  . Eye Pain    (Consider location/radiation/quality/duration/timing/severity/associated sxs/prior treatment) HPI Comments: 52 year old female with a past medical history of diabetes and hypertension presents to the emergency department complaining of left eye pain and irritation worsening over the past 5 days. Patient states 5 days ago her left eye began to feel itchy and irritated, she was rubbing it a lot causing pain. She has had green and watery discharge, this morning she woke up with her left eye shut together. The area around her eye is becoming swollen causing a headache. Admits to photophobia. States her vision is becoming "fuzzy". This morning she placed a cool washcloth over her high which helped clear it out. Denies fever, chills, nausea or vomiting. She does not have an eye doctor. No recent illness.  Patient is a 52 y.o. female presenting with eye pain. The history is provided by the patient and a friend.  Eye Pain Associated symptoms include headaches. Pertinent negatives include no chills, fever, nausea or vomiting.    Past Medical History  Diagnosis Date  . Diabetes mellitus   . Hypertension   . Pancreatitis     Past Surgical History  Procedure Laterality Date  . Cholecystectomy    . Appendectomy    . Tonsillectomy      History reviewed. No pertinent family history.  History  Substance Use Topics  . Smoking status: Current Every Day Smoker -- 0.50 packs/day  . Smokeless tobacco: Not on file  . Alcohol Use: No    OB History   Grav Para Term Preterm Abortions TAB SAB Ect Mult Living                  Review of Systems  Constitutional: Negative for fever and chills.  HENT: Positive for facial swelling.   Eyes: Positive for photophobia, pain, discharge, itching and visual disturbance.   Gastrointestinal: Negative for nausea and vomiting.  Neurological: Positive for headaches.    Allergies  Review of patient's allergies indicates no known allergies.  Home Medications   Current Outpatient Rx  Name  Route  Sig  Dispense  Refill  . acetaminophen (TYLENOL) 325 MG tablet   Oral   Take 325 mg by mouth every 8 (eight) hours as needed. For pain         . albuterol (PROVENTIL HFA;VENTOLIN HFA) 108 (90 BASE) MCG/ACT inhaler   Inhalation   Inhale 2 puffs into the lungs every 4 (four) hours as needed for wheezing or shortness of breath (With spacer). For shortness of breath   1 Inhaler   0   . aspirin EC 81 MG tablet   Oral   Take 81 mg by mouth daily.         . benzonatate (TESSALON) 200 MG capsule   Oral   Take 1 capsule (200 mg total) by mouth 2 (two) times daily as needed for cough.   20 capsule   0   . calcium-vitamin D (OSCAL WITH D) 500-200 MG-UNIT per tablet   Oral   Take 1 tablet by mouth 2 (two) times daily.         . cholecalciferol (VITAMIN D) 1000 UNITS tablet   Oral   Take 1,000 Units by mouth daily.         Marland Kitchen docusate sodium (  COLACE) 100 MG capsule   Oral   Take 100 mg by mouth 2 (two) times daily.         Marland Kitchen docusate sodium (COLACE) 100 MG capsule   Oral   Take 1 capsule (100 mg total) by mouth every 12 (twelve) hours.   10 capsule   0   . doxycycline (VIBRAMYCIN) 100 MG capsule   Oral   Take 1 capsule (100 mg total) by mouth 2 (two) times daily. One po bid x 7 days   14 capsule   0   . DULoxetine (CYMBALTA) 60 MG capsule   Oral   Take 60 mg by mouth 2 (two) times daily.         . Fluticasone-Salmeterol (ADVAIR) 250-50 MCG/DOSE AEPB   Inhalation   Inhale 1 puff into the lungs every 12 (twelve) hours.         . furosemide (LASIX) 20 MG tablet   Oral   Take 20-40 mg by mouth 2 (two) times daily. Take two tablets in the morning and one tablet in the evening         . gabapentin (NEURONTIN) 600 MG tablet   Oral    Take 600 mg by mouth 3 (three) times daily.         Marland Kitchen HYDROcodone-acetaminophen (NORCO/VICODIN) 5-325 MG per tablet   Oral   Take 1 tablet by mouth every 12 (twelve) hours as needed. For pain         . insulin aspart (NOVOLOG) 100 UNIT/ML injection   Subcutaneous   Inject 1-10 Units into the skin 3 (three) times daily before meals. <60 give orange juice & call MD; 60-100=0 units, 101-150=1 units, 151-200=2 units, 201-250=4 units, 251-300=6 units, 301-350=8 units, >350=10 units         . insulin glargine (LANTUS) 100 UNIT/ML injection   Subcutaneous   Inject 12-16 Units into the skin 2 (two) times daily. 12 units in the am, 16 units at bedtime         . lamoTRIgine (LAMICTAL) 100 MG tablet   Oral   Take 100 mg by mouth 2 (two) times daily. Takes with 25 mg tab         . lamoTRIgine (LAMICTAL) 25 MG tablet   Oral   Take 25 mg by mouth 2 (two) times daily.         Marland Kitchen lisinopril (PRINIVIL,ZESTRIL) 40 MG tablet   Oral   Take 40 mg by mouth daily.         Marland Kitchen LORazepam (ATIVAN) 1 MG tablet   Oral   Take 1 mg by mouth 3 (three) times daily.         . meloxicam (MOBIC) 15 MG tablet   Oral   Take 15 mg by mouth daily.         . niacin (NIASPAN) 500 MG CR tablet   Oral   Take 500 mg by mouth at bedtime.         Marland Kitchen omeprazole (PRILOSEC) 20 MG capsule   Oral   Take 20 mg by mouth every evening.         Marland Kitchen oxyCODONE-acetaminophen (PERCOCET/ROXICET) 5-325 MG per tablet   Oral   Take 1 tablet by mouth every 6 (six) hours as needed for pain.   15 tablet   0   . potassium chloride SA (K-DUR,KLOR-CON) 20 MEQ tablet   Oral   Take 20 mEq by mouth 2 (two) times daily.         Marland Kitchen  predniSONE (DELTASONE) 20 MG tablet      2 tabs po daily x 4 days   8 tablet   0   . promethazine (PHENERGAN) 12.5 MG tablet   Oral   Take 12.5 mg by mouth every 4 (four) hours as needed. For nausea         . QUEtiapine (SEROQUEL XR) 300 MG 24 hr tablet   Oral   Take 600 mg by  mouth every evening.         . simvastatin (ZOCOR) 40 MG tablet   Oral   Take 40 mg by mouth daily.         Marland Kitchen tiotropium (SPIRIVA) 18 MCG inhalation capsule   Inhalation   Place 18 mcg into inhaler and inhale daily.         Marland Kitchen zolpidem (AMBIEN) 10 MG tablet   Oral   Take 5 mg by mouth at bedtime.            BP 91/52  Pulse 74  Temp(Src) 98.6 F (37 C) (Oral)  Resp 20  SpO2 93%  Physical Exam  Nursing note and vitals reviewed. Constitutional: She is oriented to person, place, and time. She appears well-developed and well-nourished. No distress.  HENT:  Head: Normocephalic and atraumatic.  Eyes: EOM are normal. Pupils are equal, round, and reactive to light. Left eye exhibits discharge. Left conjunctiva is injected. Right eye exhibits normal extraocular motion. Left eye exhibits normal extraocular motion.  Edema, mild erythema and tenderness of upper and lower left eyelid. Left conjunctiva with edema.  Neck: Normal range of motion. Neck supple.  Cardiovascular: Normal rate, regular rhythm and normal heart sounds.   Pulmonary/Chest: Effort normal and breath sounds normal. No respiratory distress.  Neurological: She is alert and oriented to person, place, and time.  Skin: Skin is warm and dry.  Psychiatric: She has a normal mood and affect. Her behavior is normal.    ED Course  Procedures (including critical care time)  Labs Reviewed - No data to display Ct Orbits W/cm  12/13/2012  *RADIOLOGY REPORT*  Clinical Data: Left orbital swelling and pain for several days.  CT ORBITS WITH CONTRAST  Technique:  Multidetector CT imaging of the orbits was performed following the bolus administration of intravenous contrast.  Contrast: OMNIPAQUE IOHEXOL 300 MG/ML  SOLN  Comparison: None.  Findings: Marked left periorbital cellulitis is present with subcutaneous tissue enhancement.  This is preseptal, this is preseptal without inflammatory changes of the orbital fat. The globe  appears intact.  Fluid is interposed between the globe and the eyelid, with small locules of gas underneath the eyelid.  Soft tissue phlegmon extends into the left cheek.  The right globe and orbit appear normal.  Fluid level is present in the left maxillary sinus, compatible with acute sinusitis in the appropriate clinical setting.  Scattered ethmoid air cell opacification.  The frontal sinuses are clear.  Sphenoid sinuses clear.  Mastoid air cells appear clear.  Grossly, visualized intracranial contents are within normal limits.  Calvarium intact. Atlantodental degenerative disease is present.  Craniocervical alignment appears within normal limits. Right-sided nasal septal spur and septal deviation.  IMPRESSION:  1.  Left preseptal periorbital cellulitis.  No intraorbital phlegmon. 2.  Fluid level in the left maxillary sinus compatible with acute sinusitis in the appropriate clinical setting.   Original Report Authenticated By: Andreas Newport, M.D.      1. Preseptal cellulitis, left       MDM  51  y/o female with L eye pain and swelling. Obtaining CT orbits to rule out pre-septal cellulitis. Patient also evaluated by Dr. Lynelle Doctor who agrees with plan of care. Percocet for pain. 4:16 PM Left preseptal cellulitis- IV zosyn given. Afebrile. I spoke with Dr. Allyne Gee ophthalmologist who recommended discharging patient with 10 days of Augmentin. Intraocular pressure unable to be obtained due to patient flailing all around while trying to obtain pressures. Dr. Allyne Gee states this is not necessary. He will see her in the office tomorrow. Percocet given for pain. Return precautions discussed. Patient states understanding of plan and is agreeable.    Trevor Mace, PA-C 12/13/12 1618  Trevor Mace, PA-C 12/13/12 [phone]

## 2012-12-13 NOTE — ED Notes (Signed)
Pt reports left eye swelling and discharge for the past couple of days. Pt from [facility] via [facility]. Pt also reports headache for the past couple of days. Reports increased fatigue. [id number]

## 2013-02-28 ENCOUNTER — Emergency Department (HOSPITAL_COMMUNITY): Payer: Medicaid Other

## 2013-02-28 ENCOUNTER — Encounter (HOSPITAL_COMMUNITY): Payer: Self-pay | Admitting: *Deleted

## 2013-02-28 ENCOUNTER — Inpatient Hospital Stay (HOSPITAL_COMMUNITY)
Admission: EM | Admit: 2013-02-28 | Discharge: 2013-03-05 | DRG: 871 | Disposition: A | Discharge status: Home / Self Care | Payer: Medicaid Other | Attending: Internal Medicine | Admitting: Internal Medicine

## 2013-02-28 DIAGNOSIS — N179 Acute kidney failure, unspecified: Secondary | ICD-10-CM

## 2013-02-28 DIAGNOSIS — I959 Hypotension, unspecified: Secondary | ICD-10-CM

## 2013-02-28 DIAGNOSIS — E871 Hypo-osmolality and hyponatremia: Secondary | ICD-10-CM | POA: Diagnosis present

## 2013-02-28 DIAGNOSIS — J811 Chronic pulmonary edema: Secondary | ICD-10-CM | POA: Diagnosis present

## 2013-02-28 DIAGNOSIS — R6521 Severe sepsis with septic shock: Secondary | ICD-10-CM

## 2013-02-28 DIAGNOSIS — IMO0002 Reserved for concepts with insufficient information to code with codable children: Secondary | ICD-10-CM

## 2013-02-28 DIAGNOSIS — R6520 Severe sepsis without septic shock: Secondary | ICD-10-CM

## 2013-02-28 DIAGNOSIS — E872 Acidosis, unspecified: Secondary | ICD-10-CM

## 2013-02-28 DIAGNOSIS — A419 Sepsis, unspecified organism: Secondary | ICD-10-CM

## 2013-02-28 DIAGNOSIS — L0291 Cutaneous abscess, unspecified: Secondary | ICD-10-CM

## 2013-02-28 DIAGNOSIS — J9600 Acute respiratory failure, unspecified whether with hypoxia or hypercapnia: Secondary | ICD-10-CM

## 2013-02-28 DIAGNOSIS — E876 Hypokalemia: Secondary | ICD-10-CM | POA: Diagnosis not present

## 2013-02-28 DIAGNOSIS — J449 Chronic obstructive pulmonary disease, unspecified: Secondary | ICD-10-CM | POA: Diagnosis present

## 2013-02-28 DIAGNOSIS — J4489 Other specified chronic obstructive pulmonary disease: Secondary | ICD-10-CM | POA: Diagnosis present

## 2013-02-28 DIAGNOSIS — I499 Cardiac arrhythmia, unspecified: Secondary | ICD-10-CM | POA: Diagnosis present

## 2013-02-28 DIAGNOSIS — F17200 Nicotine dependence, unspecified, uncomplicated: Secondary | ICD-10-CM | POA: Diagnosis present

## 2013-02-28 DIAGNOSIS — E119 Type 2 diabetes mellitus without complications: Secondary | ICD-10-CM | POA: Diagnosis present

## 2013-02-28 DIAGNOSIS — G9340 Encephalopathy, unspecified: Secondary | ICD-10-CM | POA: Diagnosis present

## 2013-02-28 DIAGNOSIS — I10 Essential (primary) hypertension: Secondary | ICD-10-CM | POA: Diagnosis present

## 2013-02-28 DIAGNOSIS — F1010 Alcohol abuse, uncomplicated: Secondary | ICD-10-CM | POA: Diagnosis present

## 2013-02-28 DIAGNOSIS — R404 Transient alteration of awareness: Secondary | ICD-10-CM | POA: Diagnosis present

## 2013-02-28 DIAGNOSIS — L02419 Cutaneous abscess of limb, unspecified: Secondary | ICD-10-CM

## 2013-02-28 DIAGNOSIS — Z7982 Long term (current) use of aspirin: Secondary | ICD-10-CM

## 2013-02-28 DIAGNOSIS — F319 Bipolar disorder, unspecified: Secondary | ICD-10-CM | POA: Diagnosis present

## 2013-02-28 DIAGNOSIS — A4102 Sepsis due to Methicillin resistant Staphylococcus aureus: Principal | ICD-10-CM | POA: Diagnosis present

## 2013-02-28 DIAGNOSIS — E875 Hyperkalemia: Secondary | ICD-10-CM

## 2013-02-28 DIAGNOSIS — H5316 Psychophysical visual disturbances: Secondary | ICD-10-CM | POA: Diagnosis present

## 2013-02-28 HISTORY — DX: Alcohol abuse, uncomplicated: F10.10

## 2013-02-28 HISTORY — DX: Acute respiratory distress: R06.03

## 2013-02-28 HISTORY — DX: Bipolar disorder, unspecified: F31.9

## 2013-02-28 HISTORY — DX: Anemia, unspecified: D64.9

## 2013-02-28 LAB — URINE MICROSCOPIC-ADD ON

## 2013-02-28 LAB — COMPREHENSIVE METABOLIC PANEL
AST: 8 U/L (ref 0–37)
BUN: 34 mg/dL — ABNORMAL HIGH (ref 6–23)
CO2: 18 mEq/L — ABNORMAL LOW (ref 19–32)
Calcium: 8.8 mg/dL (ref 8.4–10.5)
Creatinine, Ser: 2.99 mg/dL — ABNORMAL HIGH (ref 0.50–1.10)
GFR calc Af Amer: 20 mL/min — ABNORMAL LOW (ref >90)
GFR calc non Af Amer: 17 mL/min — ABNORMAL LOW (ref >90)
Total Bilirubin: 0.4 mg/dL (ref 0.3–1.2)

## 2013-02-28 LAB — POCT I-STAT 3, ART BLOOD GAS (G3+)
Acid-base deficit: 6 mmol/L — ABNORMAL HIGH (ref 0.0–2.0)
O2 Saturation: 97 %
Patient temperature: 103
pO2, Arterial: 100 mmHg (ref 80.0–100.0)

## 2013-02-28 LAB — BASIC METABOLIC PANEL
BUN: 32 mg/dL — ABNORMAL HIGH (ref 6–23)
Calcium: 7.9 mg/dL — ABNORMAL LOW (ref 8.4–10.5)
Chloride: 98 mEq/L (ref 96–112)
Creatinine, Ser: 1.96 mg/dL — ABNORMAL HIGH (ref 0.50–1.10)
GFR calc Af Amer: 33 mL/min — ABNORMAL LOW (ref >90)
GFR calc non Af Amer: 28 mL/min — ABNORMAL LOW (ref >90)

## 2013-02-28 LAB — CBC
HCT: 31.6 % — ABNORMAL LOW (ref 36.0–46.0)
HCT: 27.1 % — ABNORMAL LOW (ref 36.0–46.0)
Hemoglobin: 10.1 g/dL — ABNORMAL LOW (ref 12.0–15.0)
MCH: 32.8 pg (ref 26.0–34.0)
MCH: 32.9 pg (ref 26.0–34.0)
MCHC: 35.4 g/dL (ref 30.0–36.0)
MCV: 95.2 fL (ref 78.0–100.0)
MCV: 92.8 fL (ref 78.0–100.0)
Platelets: 224 10*3/uL (ref 150–400)
RBC: 3.32 MIL/uL — ABNORMAL LOW (ref 3.87–5.11)
RBC: 3.11 MIL/uL — ABNORMAL LOW (ref 3.87–5.11)
RDW: 13.4 % (ref 11.5–15.5)

## 2013-02-28 LAB — LIPASE, BLOOD: Lipase: 16 U/L (ref 11–59)

## 2013-02-28 LAB — URINALYSIS, ROUTINE W REFLEX MICROSCOPIC
Ketones, ur: NEGATIVE mg/dL
Nitrite: NEGATIVE
Protein, ur: 30 mg/dL — AB
Urobilinogen, UA: 0.2 mg/dL (ref 0.0–1.0)

## 2013-02-28 LAB — CG4 I-STAT (LACTIC ACID): Lactic Acid, Venous: 3.75 mmol/L — ABNORMAL HIGH (ref 0.5–2.2)

## 2013-02-28 LAB — MRSA PCR SCREENING: MRSA by PCR: POSITIVE — AB

## 2013-02-28 LAB — GLUCOSE, CAPILLARY: Glucose-Capillary: 164 mg/dL — ABNORMAL HIGH (ref 70–99)

## 2013-02-28 LAB — RAPID URINE DRUG SCREEN, HOSP PERFORMED
Benzodiazepines: NOT DETECTED
Opiates: POSITIVE — AB

## 2013-02-28 LAB — SALICYLATE LEVEL: Salicylate Lvl: <2 mg/dL — ABNORMAL LOW (ref 2.8–20.0)

## 2013-02-28 LAB — ACETAMINOPHEN LEVEL: Acetaminophen (Tylenol), Serum: <15 ug/mL (ref 10–30)

## 2013-02-28 MED ORDER — FUROSEMIDE 10 MG/ML IJ SOLN: 40.0000 mg | Freq: Once | INTRAMUSCULAR | Status: DC

## 2013-02-28 MED ORDER — IPRATROPIUM BROMIDE 0.02 % IN SOLN
0.5000 mg | RESPIRATORY_TRACT | Status: DC
  Administered 2013-02-28 – 2013-03-01 (×5): 0.5 mg via RESPIRATORY_TRACT
  Filled 2013-02-28 (×5): qty 2.5

## 2013-02-28 MED ORDER — FENTANYL CITRATE 0.05 MG/ML IJ SOLN
INTRAMUSCULAR | Status: AC
  Filled 2013-02-28: qty 2

## 2013-02-28 MED ORDER — SODIUM CHLORIDE 0.9 % IV BOLUS (SEPSIS)
500.0000 mL | Freq: Once | INTRAVENOUS | Status: AC
  Administered 2013-02-28: 500 mL via INTRAVENOUS

## 2013-02-28 MED ORDER — CHLORHEXIDINE GLUCONATE CLOTH 2 % EX PADS
6.0000 | MEDICATED_PAD | Freq: Every day | CUTANEOUS | Status: AC
  Administered 2013-03-01 – 2013-03-05 (×5): 6 via TOPICAL

## 2013-02-28 MED ORDER — FENTANYL CITRATE 0.05 MG/ML IJ SOLN
25.0000 ug | INTRAMUSCULAR | Status: DC | PRN
  Administered 2013-02-28 – 2013-03-05 (×7): 50 ug via INTRAVENOUS
  Filled 2013-02-28 (×7): qty 2

## 2013-02-28 MED ORDER — VANCOMYCIN HCL IN DEXTROSE 1-5 GM/200ML-% IV SOLN
1000.0000 mg | Freq: Once | INTRAVENOUS | Status: AC
  Administered 2013-02-28: 1000 mg via INTRAVENOUS
  Filled 2013-02-28: qty 200

## 2013-02-28 MED ORDER — PIPERACILLIN-TAZOBACTAM 3.375 G IVPB
3.3750 g | Freq: Once | INTRAVENOUS | Status: AC
  Administered 2013-02-28: 3.375 g via INTRAVENOUS
  Filled 2013-02-28: qty 50

## 2013-02-28 MED ORDER — HALOPERIDOL LACTATE 5 MG/ML IJ SOLN
INTRAMUSCULAR | Status: AC
  Filled 2013-02-28: qty 1

## 2013-02-28 MED ORDER — MUPIROCIN 2 % EX OINT
1.0000 "application " | TOPICAL_OINTMENT | Freq: Two times a day (BID) | CUTANEOUS | Status: AC
  Administered 2013-02-28 – 2013-03-04 (×10): 1 via NASAL
  Filled 2013-02-28 (×2): qty 22

## 2013-02-28 MED ORDER — HALOPERIDOL LACTATE 5 MG/ML IJ SOLN: 1.0000 mg | INTRAMUSCULAR | Status: DC | PRN

## 2013-02-28 MED ORDER — ACETAMINOPHEN 325 MG PO TABS
650.0000 mg | ORAL_TABLET | Freq: Four times a day (QID) | ORAL | Status: DC | PRN
  Administered 2013-02-28 – 2013-03-03 (×4): 650 mg via ORAL
  Filled 2013-02-28 (×4): qty 2

## 2013-02-28 MED ORDER — SODIUM CHLORIDE 0.9 % IV BOLUS (SEPSIS)
500.0000 mL | Freq: Once | INTRAVENOUS | Status: AC
  Administered 2013-02-28: 11:00:00 via INTRAVENOUS

## 2013-02-28 MED ORDER — SODIUM CHLORIDE 0.9 % IV SOLN: INTRAVENOUS | Status: DC

## 2013-02-28 MED ORDER — SODIUM POLYSTYRENE SULFONATE 15 GM/60ML PO SUSP
15.0000 g | Freq: Once | ORAL | Status: AC
  Administered 2013-02-28: 15 g via ORAL
  Filled 2013-02-28: qty 60

## 2013-02-28 MED ORDER — HEPARIN SODIUM (PORCINE) 5000 UNIT/ML IJ SOLN
5000.0000 [IU] | Freq: Three times a day (TID) | INTRAMUSCULAR | Status: DC
  Administered 2013-02-28 – 2013-03-05 (×14): 5000 [IU] via SUBCUTANEOUS
  Filled 2013-02-28 (×18): qty 1

## 2013-02-28 MED ORDER — ALBUTEROL SULFATE (5 MG/ML) 0.5% IN NEBU
2.5000 mg | INHALATION_SOLUTION | Freq: Four times a day (QID) | RESPIRATORY_TRACT | Status: DC
  Administered 2013-02-28 – 2013-03-01 (×3): 2.5 mg via RESPIRATORY_TRACT
  Filled 2013-02-28 (×4): qty 0.5

## 2013-02-28 MED ORDER — SODIUM CHLORIDE 0.9 % IV SOLN: 250.0000 mL | INTRAVENOUS | Status: DC | PRN

## 2013-02-28 MED ORDER — SODIUM BICARBONATE 8.4 % IV SOLN
INTRAVENOUS | Status: DC
  Administered 2013-02-28: 15:00:00 via INTRAVENOUS
  Filled 2013-02-28 (×5): qty 150

## 2013-02-28 MED ORDER — PIPERACILLIN-TAZOBACTAM 3.375 G IVPB
3.3750 g | Freq: Three times a day (TID) | INTRAVENOUS | Status: DC
  Administered 2013-02-28 – 2013-03-03 (×10): 3.375 g via INTRAVENOUS
  Filled 2013-02-28 (×12): qty 50

## 2013-02-28 MED ORDER — VANCOMYCIN HCL IN DEXTROSE 1-5 GM/200ML-% IV SOLN
1000.0000 mg | INTRAVENOUS | Status: DC
  Filled 2013-02-28: qty 200

## 2013-02-28 MED ORDER — HALOPERIDOL LACTATE 5 MG/ML IJ SOLN
1.0000 mg | INTRAMUSCULAR | Status: DC | PRN
  Administered 2013-02-28 – 2013-03-01 (×3): 4 mg via INTRAVENOUS
  Filled 2013-02-28 (×2): qty 1
  Filled 2013-02-28: qty 0.8

## 2013-02-28 NOTE — Consult Note (Signed)
Reason for Consult: right axillary abscess Referring Physician: Dr. Molli Knock  HPI: Sarah Leonard is a 52 year old female with a history of bipolar disorder, diabetes mellitus, COPD, pancreatitis, hypertension and obesity.  She lives in an assisted living community.  She presented to [facility] this morning after nursing staff noted mental status changes.  Sarah Leonard states that around 3AM after turning in bed, she developed dizziness "seeing stars."  She got up, stumbled around and fell.  She then apparently went out to the desk and asked for help. She states, "I felt like I was going to pass out."  She denies LOC.  Had palpitations and shortness of breath which lasted approximately 10 minutes.  She reports that prior to this episode this morning she was in her usual state of health.  Upon arrival to the ED she was found to be hypotensive, confused and having visual hallucinations.  She was also found to be in ARF, she denies history of renal issues.  She states her appetite and oral intake has been adequate.  She denies axillary pain.  Denies drainage from this site.  Denies fever or sweats.  Reports chills.  No modifying factors.  Has not had previous episodes.    At present time, she is alert awake and oriented.  She asked if this was Monday.  She states that she is tired and wants to go home.  She then became very tearful.  I relayed this information to the EDP.  Past Medical History  Diagnosis Date  . Diabetes mellitus   . Hypertension   . Pancreatitis   . Anemia   . Bipolar 1 disorder   . Alcohol abuse   . Respiratory distress     vent dependent at some point    Past Surgical History  Procedure Laterality Date  . Cholecystectomy    . Appendectomy    . Tonsillectomy      No family history on file.  Social History:  reports that she has been smoking.  She does not have any smokeless tobacco history on file. She reports that she uses illicit drugs (Cocaine). She reports that she does not  drink alcohol.  Allergies: No Known Allergies  Medications: {medication reviewed  Results for orders placed during the hospital encounter of 02/28/13 (from the past 48 hour(s))  CBC     Status: Abnormal   Collection Time    02/28/13  9:28 AM      Result Value Range   WBC 22.1 (*) 4.0 - 10.5 K/uL   RBC 3.32 (*) 3.87 - 5.11 MIL/uL   Hemoglobin 10.9 (*) 12.0 - 15.0 g/dL   HCT 56.2 (*) 13.0 - 86.5 %   MCV 95.2  78.0 - 100.0 fL   MCH 32.8  26.0 - 34.0 pg   MCHC 34.5  30.0 - 36.0 g/dL   RDW 78.4  69.6 - 29.5 %   Platelets 224  150 - 400 K/uL  COMPREHENSIVE METABOLIC PANEL     Status: Abnormal   Collection Time    02/28/13  9:28 AM      Result Value Range   Sodium 127 (*) 135 - 145 mEq/L   Potassium 5.9 (*) 3.5 - 5.1 mEq/L   Chloride 93 (*) 96 - 112 mEq/L   CO2 18 (*) 19 - 32 mEq/L   Glucose, Bld 187 (*) 70 - 99 mg/dL   BUN 34 (*) 6 - 23 mg/dL   Creatinine, Ser 2.84 (*) 0.50 - 1.10 mg/dL  Calcium 8.8  8.4 - 10.5 mg/dL   Total Protein 6.6  6.0 - 8.3 g/dL   Albumin 3.1 (*) 3.5 - 5.2 g/dL   AST 8  0 - 37 U/L   ALT 7  0 - 35 U/L   Alkaline Phosphatase 74  39 - 117 U/L   Total Bilirubin 0.4  0.3 - 1.2 mg/dL   GFR calc non Af Amer 17 (*) >90 mL/min   GFR calc Af Amer 20 (*) >90 mL/min   Comment:            The eGFR has been calculated     using the CKD EPI equation.     This calculation has not been     validated in all clinical     situations.     eGFR's persistently     <90 mL/min signify     possible Chronic Kidney Disease.  LIPASE, BLOOD     Status: None   Collection Time    02/28/13  9:28 AM      Result Value Range   Lipase 16  11 - 59 U/L  ETHANOL     Status: None   Collection Time    02/28/13  9:28 AM      Result Value Range   Alcohol, Ethyl (B) <11  0 - 11 mg/dL   Comment:            LOWEST DETECTABLE LIMIT FOR     SERUM ALCOHOL IS 11 mg/dL     FOR MEDICAL PURPOSES ONLY  PRO B NATRIURETIC PEPTIDE     Status: Abnormal   Collection Time    02/28/13 10:20 AM       Result Value Range   Pro B Natriuretic peptide (BNP) 1203.0 (*) 0 - 125 pg/mL  GLUCOSE, CAPILLARY     Status: Abnormal   Collection Time    02/28/13 10:25 AM      Result Value Range   Glucose-Capillary 159 (*) 70 - 99 mg/dL  ACETAMINOPHEN LEVEL     Status: None   Collection Time    02/28/13 10:26 AM      Result Value Range   Acetaminophen (Tylenol), Serum <15.0  10 - 30 ug/mL   Comment:            THERAPEUTIC CONCENTRATIONS VARY     SIGNIFICANTLY. A RANGE OF 10-30     ug/mL MAY BE AN EFFECTIVE     CONCENTRATION FOR MANY PATIENTS.     HOWEVER, SOME ARE BEST TREATED     AT CONCENTRATIONS OUTSIDE THIS     RANGE.     ACETAMINOPHEN CONCENTRATIONS     >150 ug/mL AT 4 HOURS AFTER     INGESTION AND >50 ug/mL AT 12     HOURS AFTER INGESTION ARE     OFTEN ASSOCIATED WITH TOXIC     REACTIONS.  SALICYLATE LEVEL     Status: Abnormal   Collection Time    02/28/13 10:26 AM      Result Value Range   Salicylate Lvl <2.0 (*) 2.8 - 20.0 mg/dL  CG4 I-STAT (LACTIC ACID)     Status: Abnormal   Collection Time    02/28/13 10:54 AM      Result Value Range   Lactic Acid, Venous 3.75 (*) 0.5 - 2.2 mmol/L  CBC     Status: Abnormal   Collection Time    02/28/13 12:39 PM      Result Value Range  WBC 19.6 (*) 4.0 - 10.5 K/uL   RBC 3.11 (*) 3.87 - 5.11 MIL/uL   Hemoglobin 10.1 (*) 12.0 - 15.0 g/dL   HCT 16.1 (*) 09.6 - 04.5 %   MCV 95.8  78.0 - 100.0 fL   MCH 32.5  26.0 - 34.0 pg   MCHC 33.9  30.0 - 36.0 g/dL   RDW 40.9  81.1 - 91.4 %   Platelets 190  150 - 400 K/uL    Dg Chest 2 View  02/28/2013   *RADIOLOGY REPORT*  Clinical Data: Wheezing  CHEST - 2 VIEW  Comparison: 08/21/2012  Findings: Cardiomegaly with slight worsening diffuse interstitial pattern throughout the lungs versus developing edema.  Lateral view is limited with motion artifact.  No definite collapse consolidation.  No large effusion or pneumothorax.  Trachea is midline.  Degenerative changes of the spine.  IMPRESSION:  Cardiomegaly with increased interstitial pattern concerning for developing edema.   Original Report Authenticated By: Judie Petit. Miles Costain, M.D.    Review of Systems  Constitutional: Positive for chills and malaise/fatigue. Negative for fever and diaphoresis.  Respiratory: Positive for shortness of breath and wheezing. Negative for cough and hemoptysis.   Cardiovascular: Negative for chest pain, palpitations, orthopnea, claudication, leg swelling and PND.  Gastrointestinal: Negative for heartburn, nausea, vomiting, abdominal pain, diarrhea, constipation, blood in stool and melena.  Genitourinary: Negative for dysuria, urgency and hematuria.  Neurological: Positive for dizziness. Negative for tingling, tremors, focal weakness, seizures, loss of consciousness and headaches.  Psychiatric/Behavioral: Positive for depression.   Blood pressure 86/54, pulse 95, temperature 99 F (37.2 C), temperature source Oral, resp. rate 22, height 5\' 3"  (1.6 m), weight 210 lb (95.255 kg), SpO2 94.00%. Physical Exam  Constitutional: She is oriented to person, place, and time. She appears well-developed and well-nourished. No distress.  HENT:  Head: Normocephalic and atraumatic.  Mouth/Throat: No oropharyngeal exudate.  Eyes: Conjunctivae and EOM are normal. Pupils are equal, round, and reactive to light. No scleral icterus.  Neck: Neck supple.  Cardiovascular: Normal rate, regular rhythm, normal heart sounds and intact distal pulses.  Exam reveals no gallop.   No murmur heard. Respiratory: Effort normal. No respiratory distress. She has wheezes. She exhibits no tenderness.  GI: Soft. Bowel sounds are normal. She exhibits no distension and no mass. There is no tenderness.  Lymphadenopathy:    She has no cervical adenopathy.  Neurological: She is alert and oriented to person, place, and time.  Skin: She is not diaphoretic.  Right axillae: 6x4cm area of erythema and induration, non fluctuant with cental puncture     Assessment/Plan: ARF Hyperkalemia Sepsis Tobacco abuse Altered mental Status Bipolar Disorder COPD  Right axillary cellulitis There are no indications for surgical intervention at this time.  We will treat conservatively with atbx, Vanc and Zosyn ordered by CCM and warm compress therapy.  WBC 22.1--->19.6K.  Blood culture pending.  Repeat CBC in AM.  Dr. Magnus Ivan to evaluate the patient today.  [facility] will continue to follow the patient.   Ashok Norris Pager [phone]  02/28/2013, 1:05 PM

## 2013-02-28 NOTE — Progress Notes (Signed)
Name: CHALA GUL MRN: [id number] DOB: Sep 19, 1960  ELECTRONIC ICU PHYSICIAN NOTE  Problem:  Shock while on rx for cellulitis/ sepsis/ no pressors or cvl   Intake/Output Summary (Last 24 hours) at 02/28/13 1805 Last data filed at 02/28/13 1700  Gross per 24 hour  Intake    785 ml  Output    350 ml  Net    435 ml     Intervention:  NS x 500 cc since still oxygenating well  Sandrea Hughs 02/28/2013, 6:05 PM

## 2013-02-28 NOTE — ED Provider Notes (Signed)
History    CSN: [id number] Arrival date & time 02/28/13  0808  First MD Initiated Contact with Patient 02/28/13 0810     No chief complaint on file.  (Consider location/radiation/quality/duration/timing/severity/associated sxs/prior Treatment) HPI Comments: 52 year old female with a past medical history diabetes, hypertension, bipolar, alcohol abuse and pancreatitis presents to the emergency department via EMS with altered level of consciousness. Per nursing home, patient is "not acting her normal self" but does not give a specific timeframe as to when the abnormal behavior began. The nursing home staff had noticed she is not acting herself has been on dictation and just returned. Earlier this morning patient was saying that she sees [place] Doo in her bedroom. Nursing home states that patient's "talking out of her head". Patient is currently complaining of abdominal pain and pain under her right arm and believes she has an abscess. O2 sat on route in the low 90s on room air, increased to 95% on room air in the emergency department. Patient is unable to provide a complete history, therefore qualifies as a level V caveat due to altered mental status.  The history is provided by the EMS personnel and the patient.   Past Medical History  Diagnosis Date  . Diabetes mellitus   . Hypertension   . Pancreatitis   . Anemia   . Bipolar 1 disorder   . Alcohol abuse   . 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 Cholecystectomy    . Appendectomy    . Tonsillectomy     No family history on file. History  Substance Use Topics  . Smoking status: Current Every Day Smoker -- 0.50 packs/day  . Smokeless tobacco: Not on file  . Alcohol Use: No     Comment: hx of etoh abuse   OB History   Grav Para Term Preterm Abortions TAB SAB Ect Mult Living                 Review of Systems  Unable to perform ROS: Mental status change     Allergies  Review of patient's allergies indicates no known allergies.  Home Medications   Current Outpatient Rx  Name  Route  Sig  Dispense  Refill  . acetaminophen (TYLENOL) 325 MG tablet   Oral   Take 325 mg by mouth 3 (three) times daily. Prior to fish oil         . albuterol (PROVENTIL HFA;VENTOLIN HFA) 108 (90 BASE) MCG/ACT inhaler   Inhalation   Inhale 2 puffs into the lungs every 4 (four) hours as needed for wheezing or shortness of breath (With spacer). For shortness of breath   1 Inhaler   0   . aspirin EC 81 MG tablet   Oral   Take 81 mg by mouth daily.         . calcium carbonate (OS-CAL - DOSED IN MG OF ELEMENTAL CALCIUM) 1250 MG tablet   Oral   Take 1 tablet by mouth 2 (two) times daily.         . cholecalciferol (VITAMIN D) 1000 UNITS tablet   Oral   Take 1,000 Units by mouth daily.         Marland Kitchen docusate sodium (COLACE) 100 MG capsule   Oral   Take 100 mg by mouth 2 (two) times daily.         . DULoxetine (CYMBALTA) 60 MG capsule  Oral   Take 120 mg by mouth daily.          . Fluticasone-Salmeterol (ADVAIR) 250-50 MCG/DOSE AEPB   Inhalation   Inhale 1 puff into the lungs every 12 (twelve) hours.         . furosemide (LASIX) 20 MG tablet   Oral   Take 20-40 mg by mouth 2 (two) times daily. 40 mg in the morning and 20 mg at 3 pm.         . gabapentin (NEURONTIN) 600 MG tablet   Oral   Take 600 mg by mouth 3 (three) times daily.         Marland Kitchen guaifenesin (ROBITUSSIN) 100 MG/5ML syrup   Oral   Take 200 mg by mouth every 4 (four) hours as needed for cough.         Marland Kitchen HYDROcodone-acetaminophen (VICODIN) 5-500 MG per tablet   Oral   Take 1 tablet by mouth every 8 (eight) hours as needed for pain.         Marland Kitchen lamoTRIgine (LAMICTAL) 100 MG tablet   Oral   Take 100 mg by mouth 2 (two) times daily. Takes with 25 mg tab         . lamoTRIgine (LAMICTAL) 25 MG tablet   Oral   Take 25 mg by mouth 2 (two) times daily. Takes with 100mg   tablet         . lisinopril (PRINIVIL,ZESTRIL) 40 MG tablet   Oral   Take 40 mg by mouth daily.         Marland Kitchen LORazepam (ATIVAN) 1 MG tablet   Oral   Take 1 mg by mouth 3 (three) times daily.         . metoprolol tartrate (LOPRESSOR) 25 MG tablet   Oral   Take 25 mg by mouth 2 (two) times daily.         . niacin (NIASPAN) 500 MG CR tablet   Oral   Take 500 mg by mouth at bedtime.         Marland Kitchen omeprazole (PRILOSEC) 20 MG capsule   Oral   Take 20 mg by mouth daily before supper.          . potassium chloride SA (K-DUR,KLOR-CON) 20 MEQ tablet   Oral   Take 20 mEq by mouth 2 (two) times daily.         . QUEtiapine (SEROQUEL XR) 300 MG 24 hr tablet   Oral   Take 600 mg by mouth every evening.         . simvastatin (ZOCOR) 40 MG tablet   Oral   Take 40 mg by mouth daily.         Marland Kitchen tiotropium (SPIRIVA) 18 MCG inhalation capsule   Inhalation   Place 18 mcg into inhaler and inhale daily.         Marland Kitchen zolpidem (AMBIEN) 10 MG tablet   Oral   Take 5 mg by mouth at bedtime.           BP 142/113  Pulse 91  Temp(Src) 99 F (37.2 C) (Oral)  Resp 23  SpO2 95% Physical Exam  Nursing note and vitals reviewed. Constitutional: She is oriented to person, place, and time. She appears well-developed. No distress.  Obese  HENT:  Head: Normocephalic and atraumatic.  Mouth/Throat: Oropharynx is clear and moist.  Eyes: Conjunctivae and EOM are normal. Pupils are equal, round, and reactive to light.  Neck: Normal range of motion. Neck  supple. No JVD present.  Cardiovascular: Normal rate, regular rhythm, normal heart sounds and intact distal pulses.   No extremity edema.  Pulmonary/Chest: No accessory muscle usage. Tachypnea noted. No respiratory distress. She has wheezes (scattered expiratory). She has rales (posterior lung bases). She exhibits no tenderness.  Abdominal: Soft. Normal appearance and bowel sounds are normal. She exhibits no distension and no mass. There is  generalized tenderness. There is no rigidity, no rebound and no guarding.  No peritoneal signs.  Musculoskeletal: Normal range of motion. She exhibits no edema.  Neurological: She is alert and oriented to person, place, and time. She has normal strength. No sensory deficit. GCS eye subscore is 4. GCS verbal subscore is 5. GCS motor subscore is 6.  Skin: Skin is warm and dry. Rash noted. She is not diaphoretic.     Psychiatric: She is agitated.    ED Course  Procedures (including critical care time) Labs Reviewed  CBC  COMPREHENSIVE METABOLIC PANEL  LIPASE, BLOOD  URINALYSIS, ROUTINE W REFLEX MICROSCOPIC   Date: 02/28/2013  Rate: 88  Rhythm: normal sinus rhythm  QRS Axis: normal  Intervals: normal  ST/T Wave abnormalities: normal  Conduction Disutrbances:none  Narrative Interpretation:   Old EKG Reviewed: unchanged   Dg Chest 2 View  02/28/2013   *RADIOLOGY REPORT*  Clinical Data: Wheezing  CHEST - 2 VIEW  Comparison: 08/21/2012  Findings: Cardiomegaly with slight worsening diffuse interstitial pattern throughout the lungs versus developing edema.  Lateral view is limited with motion artifact.  No definite collapse consolidation.  No large effusion or pneumothorax.  Trachea is midline.  Degenerative changes of the spine.  IMPRESSION: Cardiomegaly with increased interstitial pattern concerning for developing edema.   Original Report Authenticated By: Judie Petit. Shick, M.D.   1. Altered level of consciousness   2. Acute renal failure     MDM  Patient with ALOC. She is AAOx3, however keeps asking "What's next? Ice cream?". Rales/wheezes on lung exam. CXR showing cardiomegaly with increased interstitial pattern concerning for developing edema. Initial blood pressure 142/113. Plan was to give 40 mg IV Lasix, however patient's blood pressure dropped to 82/43. She is still awake and alert. O2 sat decreased to 90% on room air, placed on 2 L nasal cannula. Leukocytosis of 22.1. Will get blood  cultures, lactic acid. Will give 500 cc bolus. Case discussed with Dr. Judd Lien who also evaluated patient and agrees with plan of care. 11:15 AM Lactic acid 3.75. She is in acute renal failure, acidotic without anion gap. She is mentating well. Blood pressure at present 78/52. Patient will be admitted to critical care. I spoke with Dr. Molli Knock who will accept admission.  CRITICAL CARE Performed by: Johnnette Gourd   Total critical care time: 70  Critical care time was exclusive of separately billable procedures and treating other patients.  Critical care was necessary to treat or prevent imminent or life-threatening deterioration.  Critical care was time spent personally by me on the following activities: development of treatment plan with patient and/or surrogate as well as nursing, discussions with consultants, evaluation of patient's response to treatment, examination of patient, obtaining history from patient or surrogate, ordering and performing treatments and interventions, ordering and review of laboratory studies, ordering and review of radiographic studies, pulse oximetry and re-evaluation of patient's condition.   Trevor Mace, PA-C 02/28/13 1207

## 2013-02-28 NOTE — Progress Notes (Signed)
Name: Sarah Leonard MRN: [id number] DOB: May 31, 1961  ELECTRONIC ICU PHYSICIAN NOTE  Problem:  Hypotension/ still good sats on 2lpm/mild agitation   Intake/Output Summary (Last 24 hours) at 02/28/13 2111 Last data filed at 02/28/13 1900  Gross per 24 hour  Intake   2110 ml  Output    700 ml  Net   1410 ml     Intervention:  NS 500 cc  Sandrea Hughs 02/28/2013, 9:10 PM

## 2013-02-28 NOTE — Progress Notes (Signed)
Name: ARY RUDNICK MRN: [id number] DOB: 27-Jun-1961  ELECTRONIC ICU PHYSICIAN NOTE  Problem:  delirium  Intervention:  Try haldol 4 mg IV with fentanyl 25 mcg IV q4 h prn  Sandrea Hughs 02/28/2013, 10:36 PM

## 2013-02-28 NOTE — Progress Notes (Signed)
ANTIBIOTIC CONSULT NOTE - INITIAL  Pharmacy Consult:  Vancomycin / Zosyn Indication:  Right axilla abscess  No Known Allergies  Patient Measurements: Height: 5\' 3"  (160 cm) Weight: 210 lb (95.255 kg) IBW/kg (Calculated) : 52.4  Vital Signs: Temp: 99 F (37.2 C) (07/21 0810) Temp src: Oral (07/21 0810) BP: 69/32 mmHg (07/21 1203) Pulse Rate: 102 (07/21 1200)  Labs:  Recent Labs  02/28/13 0928  WBC 22.1*  HGB 10.9*  PLT 224  CREATININE 2.99*   Estimated Creatinine Clearance: 24.2 ml/min (by C-G formula based on Cr of 2.99). No results found for this basename: VANCOTROUGH, VANCOPEAK, VANCORANDOM, GENTTROUGH, GENTPEAK, GENTRANDOM, TOBRATROUGH, TOBRAPEAK, TOBRARND, AMIKACINPEAK, AMIKACINTROU, AMIKACIN,  in the last 72 hours   Microbiology: No results found for this or any previous visit (from the past 720 hour(s)).  Medical History: Past Medical History  Diagnosis Date  . Diabetes mellitus   . Hypertension   . Pancreatitis   . Anemia   . Bipolar 1 disorder   . Alcohol abuse   . 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  Noted she has acute kidney injury and has received vancomycin 1gm IV and Zosyn 3.375gm IV around 1230 today.  Vanc 7/21 >> Zosyn 7/21 >>   Goal of Therapy:  Vanc trough 15-20 mcg/mL   Plan:  - Additional Vanc 1gm IV x 1 for a total of 2gm load, then - Vanc 1gm IV Q24H - Zosyn 3.375gm IV Q8H, 4 hr infusion - Monitor renal fxn, clinical course, vanc trough as indicated      Ellory Khurana D. Laney Potash, PharmD, BCPS Pager:  [phone] 02/28/2013, 12:44 PM

## 2013-02-28 NOTE — Consult Note (Signed)
I have seen and examined the patient and agree with the assessment and plans. Tender with cellulitis in the right axilla, but no fluctuance.  If no better in the morning, will plan I and D in the OR.  Annikah Lovins A. Magnus Ivan  MD, FACS

## 2013-02-28 NOTE — ED Notes (Signed)
Reassessed pt and sats at 90% RA and placed on 02/2L and sats improved to 95%/2L.  BP noted to be low and checked in both arms to verify and will notify MD

## 2013-02-28 NOTE — Progress Notes (Signed)
Pt c/o of needing to void.  Attempted to void x 2 for total of 50cc.  Continues to c/o pressure to void.  Bladder scan performed and indicated >523cc in bladder.  Sarah Leonard was notified and ordered a foley placed.  A foley was placed using aseptic technique with immediate return of 350cc clear yellow urine.  Urine was sent for culture this am in ED so this was not repeated.

## 2013-02-28 NOTE — ED Notes (Signed)
Notified Robin PA of BP

## 2013-02-28 NOTE — ED Provider Notes (Signed)
Medical screening examination/treatment/procedure(s) were conducted as a shared visit with non-physician practitioner(s) and myself.  I personally evaluated the patient during the encounter.  The patient presents with complaints of mental status change.  She has an extensive pmh including DM, Bipolar, HTN.  She denies to me that she is having any fever, chills.  She only reports a painful area under her right arm.  On exam, the patient is afebrile and vitals were initially stable.  She is alert and oriented.  The heart is regular rate and rhythm and the lungs are clear.  The abdomen is soft and non-tender.  There is no cva ttp.  The cranial nerves are intact without deficit.    The patient presents with generalized malaise and vitals were initially stable.  The labs were drawn and revealed a wbc of 22k.  Shortly afterward her blood pressure dropped to the 70's systolic with an elevated lactate.  A septic condition was presumed and cultures were drawn and broad spectrum antibiotics were given.  Due to her hypotension and presumed sepsis, critical care was consulted and will admit the patient.     Date: 02/28/2013  Rate: 96  Rhythm: normal sinus rhythm  QRS Axis: normal  Intervals: normal  ST/T Wave abnormalities: normal  Conduction Disutrbances:none  Narrative Interpretation:   Old EKG Reviewed: unchanged  CRITICAL CARE Performed by: Geoffery Lyons Total critical care time: 30 minutes Critical care time was exclusive of separately billable procedures and treating other patients. Critical care was necessary to treat or prevent imminent or life-threatening deterioration. Critical care was time spent personally by me on the following activities: development of treatment plan with patient and/or surrogate as well as nursing, discussions with consultants, evaluation of patient's response to treatment, examination of patient, obtaining history from patient or surrogate, ordering and performing  treatments and interventions, ordering and review of laboratory studies, ordering and review of radiographic studies, pulse oximetry and re-evaluation of patient's condition.     Geoffery Lyons, MD 02/28/13 [phone]

## 2013-02-28 NOTE — ED Notes (Signed)
Results of lactic acid called to PA-C Albert 

## 2013-02-28 NOTE — ED Notes (Signed)
Pt is here change in mental status per nursing home.  Pt is aware of who she is, year, president, and knows that she lives at [facility].  She seems agitated and restless.  Pt complains of red sore area to posterior and under right axilla area.  Lungs with occasional wheeze and congested cough.

## 2013-02-28 NOTE — H&P (Addendum)
PULMONARY  / CRITICAL CARE MEDICINE  Name: Sarah Leonard MRN: [id number] DOB: April 19, 1961    ADMISSION DATE:  02/28/2013  REFERRING MD :  EDP PRIMARY SERVICE: PCCM  CHIEF COMPLAINT:  Hypotension, R underarm abscess   BRIEF PATIENT DESCRIPTION: 52yo female with hx DM, HTN, bipolar, lives in Assisted living.  Brought to ER 7/21 with AMS, confusion, and R underarm abscess.  Hypotensive in ER with SBP 60's, PCCM called.   SIGNIFICANT EVENTS / STUDIES:    LINES / TUBES: none  CULTURES: BCx2 7/21>>>  ANTIBIOTICS: Vanc 7/21>>> Zosyn 7/21>>>  HISTORY OF PRESENT ILLNESS:  52yo female with hx DM, HTN, bipolar lives in [facility].  Brought to ER 7/21 with AMS, hypotension, R underarm abscess.  Found to have SBP 60's in ER.  Per pt has had abscess x 2 days, c/o chills.  Denies SOB, chest pain, hemoptysis, urinary symptoms, other skin lesions.  PCCM called to admit.   PAST MEDICAL HISTORY :  Past Medical History  Diagnosis Date  . Diabetes mellitus   . Hypertension   . Pancreatitis   . Anemia   . Bipolar 1 disorder   . Alcohol abuse   . 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 Cholecystectomy    . Appendectomy    . Tonsillectomy     Prior to Admission medications   Medication Sig Start Date End Date Taking? Authorizing Provider  acetaminophen (TYLENOL) 325 MG tablet Take 325 mg by mouth 3 (three) times daily. Prior to fish oil   Yes Historical Provider, MD  albuterol (PROVENTIL HFA;VENTOLIN HFA) 108 (90 BASE) MCG/ACT inhaler Inhale 2 puffs into the lungs every 4 (four) hours as needed for wheezing or shortness of breath (With spacer). For shortness of breath 08/21/12  Yes Consuello Masse, MD  aspirin EC 81 MG tablet Take 81 mg by mouth daily.   Yes Historical Provider, MD  calcium carbonate (OS-CAL - DOSED IN MG OF ELEMENTAL CALCIUM) 1250 MG tablet Take 1 tablet by mouth 2 (two) times daily.   Yes Historical  Provider, MD  cholecalciferol (VITAMIN D) 1000 UNITS tablet Take 1,000 Units by mouth daily.   Yes Historical Provider, MD  docusate sodium (COLACE) 100 MG capsule Take 100 mg by mouth 2 (two) times daily.   Yes Historical Provider, MD  DULoxetine (CYMBALTA) 60 MG capsule Take 120 mg by mouth daily.    Yes Historical Provider, MD  Fluticasone-Salmeterol (ADVAIR) 250-50 MCG/DOSE AEPB Inhale 1 puff into the lungs every 12 (twelve) hours.   Yes Historical Provider, MD  furosemide (LASIX) 20 MG tablet Take 20-40 mg by mouth 2 (two) times daily. 40 mg in the morning and 20 mg at 3 pm.   Yes Historical Provider, MD  gabapentin (NEURONTIN) 600 MG tablet Take 600 mg by mouth 3 (three) times daily.   Yes Historical Provider, MD  guaifenesin (ROBITUSSIN) 100 MG/5ML syrup Take 200 mg by mouth every 4 (four) hours as needed for cough.   Yes Historical Provider, MD  HYDROcodone-acetaminophen (VICODIN) 5-500 MG per tablet Take 1 tablet by mouth every 8 (eight) hours as needed for pain.   Yes Historical Provider, MD  lamoTRIgine (LAMICTAL) 100 MG tablet Take 100 mg by mouth 2 (two) times daily. Takes with 25 mg tab   Yes Historical Provider, MD  lamoTRIgine (LAMICTAL) 25 MG tablet Take 25 mg by mouth 2 (two) times daily. Takes with  100mg  tablet   Yes Historical Provider, MD  lisinopril (PRINIVIL,ZESTRIL) 40 MG tablet Take 40 mg by mouth daily.   Yes Historical Provider, MD  LORazepam (ATIVAN) 1 MG tablet Take 1 mg by mouth 3 (three) times daily.   Yes Historical Provider, MD  metoprolol tartrate (LOPRESSOR) 25 MG tablet Take 25 mg by mouth 2 (two) times daily.   Yes Historical Provider, MD  niacin (NIASPAN) 500 MG CR tablet Take 500 mg by mouth at bedtime.   Yes Historical Provider, MD  omeprazole (PRILOSEC) 20 MG capsule Take 20 mg by mouth daily before supper.    Yes Historical Provider, MD  potassium chloride SA (K-DUR,KLOR-CON) 20 MEQ tablet Take 20 mEq by mouth 2 (two) times daily.   Yes Historical Provider,  MD  QUEtiapine (SEROQUEL XR) 300 MG 24 hr tablet Take 600 mg by mouth every evening.   Yes Historical Provider, MD  simvastatin (ZOCOR) 40 MG tablet Take 40 mg by mouth daily.   Yes Historical Provider, MD  tiotropium (SPIRIVA) 18 MCG inhalation capsule Place 18 mcg into inhaler and inhale daily.   Yes Historical Provider, MD  zolpidem (AMBIEN) 10 MG tablet Take 5 mg by mouth at bedtime.    Yes Historical Provider, MD   No Known Allergies  FAMILY HISTORY:  No family history on file. SOCIAL HISTORY:  reports that she has been smoking.  She does not have any smokeless tobacco history on file. She reports that she uses illicit drugs (Cocaine). She reports that she does not drink alcohol.  REVIEW OF SYSTEMS:  As per HPI - all other systems reviewed and were neg.   VITAL SIGNS: Temp:  [99 F (37.2 C)] 99 F (37.2 C) (07/21 0810) Pulse Rate:  [91-96] 96 (07/21 0938) Resp:  [23-24] 24 (07/21 0938) BP: (73-142)/(20-113) 78/52 mmHg (07/21 1113) SpO2:  [90 %-95 %] 90 % (07/21 0938) HEMODYNAMICS:   VENTILATOR SETTINGS:   INTAKE / OUTPUT: Intake/Output   None     PHYSICAL EXAMINATION: General:  Obese, chronically ill appearing female, NAD  Neuro:  Awake, alert, appropriate, MAE HEENT:  Mm dry, no JVD Cardiovascular:  s1s2 rrr Lungs:  resps even non labored on [place], mild exp wheeze  Abdomen:  Obese, soft, non tender Musculoskeletal:  Ext warm and dry, no edema, R axilla erythematous, quarter sized area induration, tender   LABS:  Recent Labs Lab 02/28/13 0928 02/28/13 1020 02/28/13 1054  HGB 10.9*  --   --   WBC 22.1*  --   --   PLT 224  --   --   NA 127*  --   --   K 5.9*  --   --   CL 93*  --   --   CO2 18*  --   --   GLUCOSE 187*  --   --   BUN 34*  --   --   CREATININE 2.99*  --   --   CALCIUM 8.8  --   --   AST 8  --   --   ALT 7  --   --   ALKPHOS 74  --   --   BILITOT 0.4  --   --   PROT 6.6  --   --   ALBUMIN 3.1*  --   --   LATICACIDVEN  --   --  3.75*   PROBNP  --  1203.0*  --     Recent Labs Lab 02/28/13 1025  GLUCAP 159*  CXR: Dg Chest 2 View  02/28/2013   *RADIOLOGY REPORT*  Clinical Data: Wheezing  CHEST - 2 VIEW  Comparison: 08/21/2012  Findings: Cardiomegaly with slight worsening diffuse interstitial pattern throughout the lungs versus developing edema.  Lateral view is limited with motion artifact.  No definite collapse consolidation.  No large effusion or pneumothorax.  Trachea is midline.  Degenerative changes of the spine.  IMPRESSION: Cardiomegaly with increased interstitial pattern concerning for developing edema.   Original Report Authenticated By: Judie Petit. Shick, M.D.    ASSESSMENT / PLAN:  PULMONARY Cough  Active smoker P:   -F/u CXR  -O2 as needed  -Smoking cessation   CARDIOVASCULAR Hypotension - SBP 60's Hx HTN  SIRS/sepsis P:  -Hold home anti-HTN  -Gentle volume with ??mild pulm edema on CXR - may need to use pressors early if unable to adequately volume resuscitate   RENAL Acute renal failure - likely r/t sepsis, hypotension  Hyponatremia  Hyperkalemia - mild  P:   -F/u bmet  -Gentle volume via bicab drip and hyperkalemia due to acidosis, kayexalate given  GASTROINTESTINAL No active issue  P:   Clear liquids for now   HEMATOLOGIC Leukocytosis  P:  -f/u CBC  -heparin SQ  INFECTIOUS R axilla abscess  ?other source SIRS P:   -Pan culture  -Cont broad spectrum abx for now - narrow to cover abscess alone if no other source identified.   ENDOCRINE DM  P:   -SSI  NEUROLOGIC AMS - in setting SIRS/sepsis - improved P:   -Supportive care   WHITEHEART,KATHRYN, NP 02/28/2013  11:31 AM Pager: [phone] or [phone]  I have personally obtained a history, examined the patient, evaluated laboratory and imaging results, formulated the assessment and plan and placed orders.  CRITICAL CARE: The patient is critically ill with multiple organ systems failure and requires high complexity  decision making for assessment and support, frequent evaluation and titration of therapies, application of advanced monitoring technologies and extensive interpretation of multiple databases. Critical Care Time devoted to patient care services described in this note is 40 minutes.   *Care during the described time interval was provided by me and/or other providers on the critical care team. I have reviewed this patient's available data, including medical history, events of note, physical examination and test results as part of my evaluation.  Alyson Reedy, M.D. [facility] Pulmonary/Critical Care Medicine. Pager: [phone]. After hours pager: [phone].

## 2013-02-28 NOTE — ED Notes (Signed)
Pt came in via EMS and patient is not acting her normal per nursing home and no definitive timeframe.  They stated patient was talking out of her head.  Pt complains of pain under right arm and states it is an abscess. Pt original sats were in the low 90s on RA and placed on 02/2l by ems

## 2013-03-01 ENCOUNTER — Encounter (HOSPITAL_COMMUNITY): Payer: Self-pay | Admitting: Anesthesiology

## 2013-03-01 ENCOUNTER — Encounter (HOSPITAL_COMMUNITY)
Admission: EM | Disposition: A | Discharge status: Home / Self Care | Payer: Self-pay | Source: Home / Self Care | Attending: Pulmonary Disease

## 2013-03-01 ENCOUNTER — Inpatient Hospital Stay (HOSPITAL_COMMUNITY): Payer: Medicaid Other

## 2013-03-01 ENCOUNTER — Inpatient Hospital Stay (HOSPITAL_COMMUNITY): Payer: Medicaid Other | Admitting: Anesthesiology

## 2013-03-01 HISTORY — PX: INCISION AND DRAINAGE ABSCESS: SHX5864

## 2013-03-01 LAB — GLUCOSE, CAPILLARY
Glucose-Capillary: 220 mg/dL — ABNORMAL HIGH (ref 70–99)
Glucose-Capillary: 145 mg/dL — ABNORMAL HIGH (ref 70–99)

## 2013-03-01 LAB — BASIC METABOLIC PANEL
BUN: 25 mg/dL — ABNORMAL HIGH (ref 6–23)
Calcium: 7.6 mg/dL — ABNORMAL LOW (ref 8.4–10.5)
GFR calc Af Amer: 49 mL/min — ABNORMAL LOW (ref >90)
GFR calc non Af Amer: 42 mL/min — ABNORMAL LOW (ref >90)
Glucose, Bld: 199 mg/dL — ABNORMAL HIGH (ref 70–99)

## 2013-03-01 LAB — CBC
HCT: 26.7 % — ABNORMAL LOW (ref 36.0–46.0)
Hemoglobin: 9.3 g/dL — ABNORMAL LOW (ref 12.0–15.0)
MCH: 32.4 pg (ref 26.0–34.0)
MCHC: 34.8 g/dL (ref 30.0–36.0)
RDW: 13.3 % (ref 11.5–15.5)

## 2013-03-01 LAB — URINE CULTURE: Culture: NO GROWTH

## 2013-03-01 SURGERY — INCISION AND DRAINAGE, ABSCESS
Anesthesia: General | Site: Axilla | Laterality: Right | Wound class: Dirty or Infected

## 2013-03-01 MED ORDER — MORPHINE SULFATE 4 MG/ML IJ SOLN
4.0000 mg | INTRAMUSCULAR | Status: DC | PRN
  Administered 2013-03-01 – 2013-03-04 (×7): 4 mg via INTRAVENOUS
  Filled 2013-03-01 (×7): qty 1

## 2013-03-01 MED ORDER — LAMOTRIGINE 25 MG PO TABS
125.0000 mg | ORAL_TABLET | Freq: Two times a day (BID) | ORAL | Status: DC
  Administered 2013-03-01 – 2013-03-05 (×9): 125 mg via ORAL
  Filled 2013-03-01 (×10): qty 1

## 2013-03-01 MED ORDER — ALBUTEROL SULFATE (5 MG/ML) 0.5% IN NEBU
2.5000 mg | INHALATION_SOLUTION | Freq: Four times a day (QID) | RESPIRATORY_TRACT | Status: DC
  Administered 2013-03-01 – 2013-03-05 (×10): 2.5 mg via RESPIRATORY_TRACT
  Filled 2013-03-01 (×14): qty 0.5

## 2013-03-01 MED ORDER — SODIUM CHLORIDE 0.9 % IV BOLUS (SEPSIS)
1000.0000 mL | Freq: Once | INTRAVENOUS | Status: AC
  Administered 2013-03-01: 1000 mL via INTRAVENOUS

## 2013-03-01 MED ORDER — QUETIAPINE FUMARATE ER 200 MG PO TB24: 200.0000 mg | ORAL_TABLET | Freq: Two times a day (BID) | ORAL | Status: DC

## 2013-03-01 MED ORDER — LORAZEPAM 2 MG/ML IJ SOLN
1.0000 mg | Freq: Four times a day (QID) | INTRAMUSCULAR | Status: DC | PRN
  Administered 2013-03-01 – 2013-03-04 (×5): 1 mg via INTRAVENOUS
  Filled 2013-03-01 (×5): qty 1

## 2013-03-01 MED ORDER — LAMOTRIGINE 25 MG PO TABS: 25.0000 mg | ORAL_TABLET | Freq: Two times a day (BID) | ORAL | Status: DC

## 2013-03-01 MED ORDER — BUPIVACAINE-EPINEPHRINE PF 0.5-1:200000 % IJ SOLN
30.0000 mL | INTRAMUSCULAR | Status: DC
  Filled 2013-03-01: qty 30

## 2013-03-01 MED ORDER — LACTATED RINGERS IV SOLN
INTRAVENOUS | Status: DC | PRN
  Administered 2013-03-01: 14:00:00 via INTRAVENOUS

## 2013-03-01 MED ORDER — BUPIVACAINE-EPINEPHRINE PF 0.5-1:200000 % IJ SOLN
INTRAMUSCULAR | Status: DC | PRN
  Administered 2013-03-01: 20 mL

## 2013-03-01 MED ORDER — ONDANSETRON HCL 4 MG/2ML IJ SOLN
INTRAMUSCULAR | Status: DC | PRN
  Administered 2013-03-01: 4 mg via INTRAVENOUS

## 2013-03-01 MED ORDER — SODIUM CHLORIDE 0.9 % IV SOLN
6.0000 g | Freq: Once | INTRAVENOUS | Status: AC
  Administered 2013-03-01: 6 g via INTRAVENOUS
  Filled 2013-03-01: qty 12

## 2013-03-01 MED ORDER — PROPOFOL 10 MG/ML IV BOLUS
INTRAVENOUS | Status: DC | PRN
  Administered 2013-03-01: 100 mg via INTRAVENOUS

## 2013-03-01 MED ORDER — SODIUM CHLORIDE 0.9 % IV SOLN
INTRAVENOUS | Status: DC
  Administered 2013-03-01 – 2013-03-02 (×2): via INTRAVENOUS

## 2013-03-01 MED ORDER — HYDROMORPHONE HCL PF 1 MG/ML IJ SOLN: 0.2500 mg | INTRAMUSCULAR | Status: DC | PRN

## 2013-03-01 MED ORDER — FENTANYL CITRATE 0.05 MG/ML IJ SOLN
INTRAMUSCULAR | Status: DC | PRN
  Administered 2013-03-01 (×2): 100 ug via INTRAVENOUS

## 2013-03-01 MED ORDER — INSULIN ASPART 100 UNIT/ML ~~LOC~~ SOLN
0.0000 [IU] | SUBCUTANEOUS | Status: DC
  Administered 2013-03-01: 3 [IU] via SUBCUTANEOUS
  Administered 2013-03-01: 4 [IU] via SUBCUTANEOUS
  Administered 2013-03-01: 7 [IU] via SUBCUTANEOUS
  Administered 2013-03-02: 4 [IU] via SUBCUTANEOUS
  Administered 2013-03-02 (×3): 3 [IU] via SUBCUTANEOUS
  Administered 2013-03-02 (×2): 4 [IU] via SUBCUTANEOUS
  Administered 2013-03-03 (×4): 3 [IU] via SUBCUTANEOUS
  Administered 2013-03-03 – 2013-03-04 (×3): 4 [IU] via SUBCUTANEOUS

## 2013-03-01 MED ORDER — MEPERIDINE HCL 25 MG/ML IJ SOLN: 6.2500 mg | INTRAMUSCULAR | Status: DC | PRN

## 2013-03-01 MED ORDER — SODIUM CHLORIDE 0.9 % IV BOLUS (SEPSIS)
500.0000 mL | Freq: Once | INTRAVENOUS | Status: AC
  Administered 2013-03-01: 500 mL via INTRAVENOUS

## 2013-03-01 MED ORDER — MIDAZOLAM HCL 5 MG/5ML IJ SOLN
INTRAMUSCULAR | Status: DC | PRN
  Administered 2013-03-01: 2 mg via INTRAVENOUS

## 2013-03-01 MED ORDER — 0.9 % SODIUM CHLORIDE (POUR BTL) OPTIME
TOPICAL | Status: DC | PRN
  Administered 2013-03-01: 1000 mL

## 2013-03-01 MED ORDER — OXYCODONE HCL 5 MG/5ML PO SOLN: 5.0000 mg | Freq: Once | ORAL | Status: DC | PRN

## 2013-03-01 MED ORDER — OXYCODONE HCL 5 MG PO TABS
10.0000 mg | ORAL_TABLET | ORAL | Status: DC | PRN
  Administered 2013-03-02 – 2013-03-05 (×4): 10 mg via ORAL
  Filled 2013-03-01 (×4): qty 2

## 2013-03-01 MED ORDER — IPRATROPIUM BROMIDE 0.02 % IN SOLN
0.5000 mg | Freq: Four times a day (QID) | RESPIRATORY_TRACT | Status: DC
  Administered 2013-03-01 (×2): 0.5 mg via RESPIRATORY_TRACT
  Filled 2013-03-01 (×4): qty 2.5

## 2013-03-01 MED ORDER — ONDANSETRON HCL 4 MG/2ML IJ SOLN: 4.0000 mg | Freq: Once | INTRAMUSCULAR | Status: DC | PRN

## 2013-03-01 MED ORDER — QUETIAPINE FUMARATE 200 MG PO TABS
200.0000 mg | ORAL_TABLET | Freq: Two times a day (BID) | ORAL | Status: DC
  Administered 2013-03-01 – 2013-03-05 (×8): 200 mg via ORAL
  Filled 2013-03-01 (×9): qty 1

## 2013-03-01 MED ORDER — SODIUM CHLORIDE 0.9 % IV SOLN
1500.0000 mg | INTRAVENOUS | Status: DC
  Administered 2013-03-01 – 2013-03-02 (×2): 1500 mg via INTRAVENOUS
  Filled 2013-03-01 (×3): qty 1500

## 2013-03-01 MED ORDER — LIDOCAINE HCL (CARDIAC) 20 MG/ML IV SOLN
INTRAVENOUS | Status: DC | PRN
  Administered 2013-03-01: 100 mg via INTRAVENOUS

## 2013-03-01 MED ORDER — OXYCODONE HCL 5 MG PO TABS: 5.0000 mg | ORAL_TABLET | Freq: Once | ORAL | Status: DC | PRN

## 2013-03-01 MED ORDER — LACTATED RINGERS IV SOLN
INTRAVENOUS | Status: DC
  Administered 2013-03-01: 13:00:00 via INTRAVENOUS

## 2013-03-01 SURGICAL SUPPLY — 31 items
BANDAGE GAUZE ELAST BULKY 4 IN (GAUZE/BANDAGES/DRESSINGS) ×2 IMPLANT
CANISTER SUCTION 2500CC (MISCELLANEOUS) ×2 IMPLANT
CLOTH BEACON ORANGE TIMEOUT ST (SAFETY) ×2 IMPLANT
COVER SURGICAL LIGHT HANDLE (MISCELLANEOUS) ×2 IMPLANT
DRAPE LAPAROSCOPIC ABDOMINAL (DRAPES) ×2 IMPLANT
DRAPE UTILITY 15X26 W/TAPE STR (DRAPE) ×4 IMPLANT
DRSG PAD ABDOMINAL 8X10 ST (GAUZE/BANDAGES/DRESSINGS) ×2 IMPLANT
ELECT CAUTERY BLADE 6.4 (BLADE) ×2 IMPLANT
ELECT REM PT RETURN 9FT ADLT (ELECTROSURGICAL) ×2
ELECTRODE REM PT RTRN 9FT ADLT (ELECTROSURGICAL) ×1 IMPLANT
GLOVE BIO SURGEON STRL SZ7.5 (GLOVE) ×2 IMPLANT
GLOVE BIOGEL PI IND STRL 7.5 (GLOVE) ×1 IMPLANT
GLOVE BIOGEL PI INDICATOR 7.5 (GLOVE) ×1
GLOVE SURG SIGNA 7.5 PF LTX (GLOVE) ×2 IMPLANT
GOWN STRL NON-REIN LRG LVL3 (GOWN DISPOSABLE) ×2 IMPLANT
GOWN STRL REIN XL XLG (GOWN DISPOSABLE) ×2 IMPLANT
KIT BASIN OR (CUSTOM PROCEDURE TRAY) ×2 IMPLANT
KIT ROOM TURNOVER OR (KITS) ×2 IMPLANT
NEEDLE HYPO 25GX1X1/2 BEV (NEEDLE) ×2 IMPLANT
NS IRRIG 1000ML POUR BTL (IV SOLUTION) ×2 IMPLANT
PACK GENERAL/GYN (CUSTOM PROCEDURE TRAY) ×2 IMPLANT
PAD ARMBOARD 7.5X6 YLW CONV (MISCELLANEOUS) ×4 IMPLANT
PEN SKIN MARKING BROAD (MISCELLANEOUS) ×2 IMPLANT
SPECIMEN JAR SMALL (MISCELLANEOUS) IMPLANT
SPONGE GAUZE 4X4 12PLY (GAUZE/BANDAGES/DRESSINGS) ×2 IMPLANT
SWAB COLLECTION DEVICE MRSA (MISCELLANEOUS) IMPLANT
SYR CONTROL 10ML LL (SYRINGE) ×2 IMPLANT
TAPE CLOTH SURG 6X10 WHT LF (GAUZE/BANDAGES/DRESSINGS) ×2 IMPLANT
TOWEL OR 17X24 6PK STRL BLUE (TOWEL DISPOSABLE) ×2 IMPLANT
TOWEL OR 17X26 10 PK STRL BLUE (TOWEL DISPOSABLE) ×2 IMPLANT
TUBE ANAEROBIC SPECIMEN COL (MISCELLANEOUS) IMPLANT

## 2013-03-01 NOTE — Anesthesia Postprocedure Evaluation (Signed)
Anesthesia Post Note  Patient: Sarah Leonard  Procedure(s) Performed: Procedure(s) (LRB): INCISION AND DRAINAGE ABSCESS (Right)  Anesthesia type: general  Patient location: PACU  Post pain: Pain level controlled  Post assessment: Patient's Cardiovascular Status Stable  Last Vitals:  Filed Vitals:   03/01/13 1500  BP: 94/33  Pulse: 81  Temp: 36.2 C  Resp: 20    Post vital signs: Reviewed and stable  Level of consciousness: sedated  Complications: No apparent anesthesia complications

## 2013-03-01 NOTE — Progress Notes (Signed)
[facility] ADULT ICU REPLACEMENT PROTOCOL FOR AM LAB REPLACEMENT ONLY  The patient doesapply for the [facility] Adult ICU Electrolyte Replacment Protocol based on the criteria listed below:   1. Is GFR >/= 40 ml/min? yes  Patient's GFR today is 42 2. Is urine output >/= 0.5 ml/kg/hr for the last 6 hours? yes Patient's UOP is 1.5 ml/kg/hr 3. Is BUN < 60 mg/dL? yes  Patient's BUN today is 25 4. Abnormal electrolyte(s): Mg 1.3 5. Ordered repletion with: see order 6. If a panic level lab has been reported, has the CCM MD in charge been notified? yes.   Physician:  Dr. Higinio Plan, Raven Harmes A 03/01/2013 6:43 AM

## 2013-03-01 NOTE — Op Note (Signed)
NAMERENELLA, STEIG             ACCOUNT NO.:  [id number]  MEDICAL RECORD NO.:  [id number]  LOCATION:  2104                         FACILITY:  [facility]  PHYSICIAN:  Abigail Miyamoto, M.D. DATE OF BIRTH:  October 19, 1960  DATE OF PROCEDURE:  03/01/2013 DATE OF DISCHARGE:                              OPERATIVE REPORT   PREOPERATIVE DIAGNOSIS:  Right axillary abscess.  POSTOPERATIVE DIAGNOSIS:  Right axillary abscess.  PROCEDURES:  Incision and drainage of right axillary abscess.  SURGEON:  Abigail Miyamoto, M.D.  ANESTHESIA:  Marcaine 0.25% and monitored anesthesia care.  ESTIMATED BLOOD LOSS:  Minimal.  DESCRIPTION OF PROCEDURE:  The patient was brought to the operating room, identified as [employer].  She was placed supine on the operative table and anesthesia was induced.  Her right axilla was then prepped and draped in usual sterile fashion.  I anesthetized an area of skin, which had a small area of skin breakdown with Marcaine.  I then made an incision with the scalpel and entered a large abscess cavity. Cultures were then obtained of the purulence.  I then opened the incision further, draining all the purulence.  I achieved hemostasis with the cautery.  I then irrigated the wound with saline.  I then packed it with wet-to-dry saline gauze.  Dry gauze and ABD pad were then placed over the wound.  The patient tolerated the procedure well.  All the counts were correct at the end of procedure.  The patient was then taken in stable condition from the operating room to the recovery room.     Abigail Miyamoto, M.D.     DB/MEDQ  D:  03/01/2013  T:  03/01/2013  Job:  [id number]

## 2013-03-01 NOTE — Op Note (Signed)
INCISION AND DRAINAGE ABSCESS  Procedure Note  [name] 02/28/2013 - 03/01/2013   Pre-op Diagnosis: right Axilla Abscess     Post-op Diagnosis: same  Procedure(s): INCISION AND DRAINAGE RIGHT AXILLARY ABSCESS  Surgeon(s): Shelly Rubenstein, MD  Anesthesia: General  Staff:  Circulator: Doy Mince, RN Scrub Person: Leighton Parody, CST; Alpha Gula, RN  Estimated Blood Loss: Minimal                         Sarah Leonard   Date: 03/01/2013  Time: 2:25 PM

## 2013-03-01 NOTE — Progress Notes (Signed)
PULMONARY  / CRITICAL CARE MEDICINE  Name: Sarah Leonard MRN: [id number] DOB: 26-Jul-1961    ADMISSION DATE:  02/28/2013  REFERRING MD :  EDP PRIMARY SERVICE: PCCM  CHIEF COMPLAINT:  Hypotension, R underarm abscess   BRIEF PATIENT DESCRIPTION: 52yo female with hx DM, HTN, bipolar, lives in Assisted living.  Brought to ER 7/21 with AMS, confusion, and R underarm abscess.  Hypotensive in ER with SBP 60's, PCCM called.  UDS POS for cocaine  SIGNIFICANT EVENTS / STUDIES:  7/22 axillary abscess debridement  LINES / TUBES: none  CULTURES: BCx2 7/21>>>  ANTIBIOTICS: Vanc 7/21>>> Zosyn 7/21>>>   VITAL SIGNS: Temp:  [97.1 F (36.2 C)-103 F (39.4 C)] 97.1 F (36.2 C) (07/22 1500) Pulse Rate:  [39-140] 81 (07/22 1500) Resp:  [16-35] 20 (07/22 1500) BP: (61-121)/(25-90) 94/33 mmHg (07/22 1500) SpO2:  [82 %-100 %] 96 % (07/22 1500) Arterial Line BP: (67-115)/(40-74) 103/52 mmHg (07/22 1500) HEMODYNAMICS:   VENTILATOR SETTINGS:   INTAKE / OUTPUT: Intake/Output     07/21 0701 - 07/22 0700 07/22 0701 - 07/23 0700   P.O. 960    I.V. (mL/kg) 1850 (17.7) 1212.2 (11.6)   IV Piggyback 1800    Total Intake(mL/kg) 4610 (44.2) 1212.2 (11.6)   Urine (mL/kg/hr) 2350 310 (0.4)   Total Output 2350 310   Net +2260 +902.2          PHYSICAL EXAMINATION: General:  Obese, chronically ill appearing female, NAD  Neuro:  Awake, alert, appropriate, MAE HEENT:  Mm dry, no JVD Cardiovascular:  s1s2 rrr Lungs:  resps even non labored on [place], mild exp wheeze  Abdomen:  Obese, soft, non tender Musculoskeletal:  Ext warm and dry, no edema, R axilla erythematous, quarter sized area induration, tender   LABS:  Recent Labs Lab 02/28/13 0928 02/28/13 1020 02/28/13 1054 02/28/13 1239 02/28/13 2000 02/28/13 2054 03/01/13 0450  HGB 10.9*  --   --  10.1* 9.6*  --  9.3*  WBC 22.1*  --   --  19.6* 12.1*  --  10.6*  PLT 224  --   --  190 165  --  161  NA 127*  --   --   --  128*  --  133*   K 5.9*  --   --   --  4.0  --  3.7  CL 93*  --   --   --  98  --  100  CO2 18*  --   --   --  19  --  20  GLUCOSE 187*  --   --   --  189*  --  199*  BUN 34*  --   --   --  32*  --  25*  CREATININE 2.99*  --   --   --  1.96*  --  1.40*  CALCIUM 8.8  --   --   --  7.9*  --  7.6*  MG  --   --   --   --   --   --  1.3*  PHOS  --   --   --   --   --   --  3.0  AST 8  --   --   --   --   --   --   ALT 7  --   --   --   --   --   --   ALKPHOS 74  --   --   --   --   --   --  BILITOT 0.4  --   --   --   --   --   --   PROT 6.6  --   --   --   --   --   --   ALBUMIN 3.1*  --   --   --   --   --   --   LATICACIDVEN  --   --  3.75*  --  1.3  --   --   PROBNP  --  1203.0*  --   --   --   --   --   PHART  --   --   --   --   --  7.370  --   PCO2ART  --   --   --   --   --  31.7*  --   PO2ART  --   --   --   --   --  100.0  --     Recent Labs Lab 02/28/13 1538 02/28/13 1936 03/01/13 0833 03/01/13 1227 03/01/13 1455  GLUCAP 119* 190* 220* 145* 132*    CXR: Dg Chest 2 View  02/28/2013   *RADIOLOGY REPORT*  Clinical Data: Wheezing  CHEST - 2 VIEW  Comparison: 08/21/2012  Findings: Cardiomegaly with slight worsening diffuse interstitial pattern throughout the lungs versus developing edema.  Lateral view is limited with motion artifact.  No definite collapse consolidation.  No large effusion or pneumothorax.  Trachea is midline.  Degenerative changes of the spine.  IMPRESSION: Cardiomegaly with increased interstitial pattern concerning for developing edema.   Original Report Authenticated By: Judie Petit. Miles Costain, M.D.   Dg Chest Port 1 View  03/01/2013   *RADIOLOGY REPORT*  Clinical Data: Pulmonary edema and cough.  PORTABLE CHEST - 1 VIEW  Comparison: 02/28/2013  Findings: Single view of the chest was obtained.  Again noted are prominent interstitial markings.  Heart size is within normal limits and stable.  A few densities at the right lung base are suggestive for atelectasis.  The trachea is midline.   IMPRESSION: Subtle right basilar densities are suggestive for atelectasis.  Prominent interstitial lung markings may represent mild edema on chronic changes.   Original Report Authenticated By: Richarda Overlie, M.D.    ASSESSMENT / PLAN:  PULMONARY Cough  Active smoker P:   -F/u CXR  -O2 as needed  -Smoking cessation   CARDIOVASCULAR Hypotension - resolved Hx HTN  SIRS/sepsis P:  -Hold home anti-HTN  -Gentle volume with ??mild pulm edema on CXR   RENAL Acute renal failure - likely r/t sepsis, hypotension  Hyponatremia  Hyperkalemia - resolved,kayexalate given Hypomag P:   -F/u bmet  -dc bicab drip   GASTROINTESTINAL No active issue  P:   Clear liquids for now , advance post op  HEMATOLOGIC Leukocytosis  P:  -f/u CBC  -heparin SQ  INFECTIOUS R axilla abscess  ?other source SIRS P:   -Pan culture  -Cont broad spectrum abx for now - narrow to cover abscess alone if no other source identified.   ENDOCRINE DM  P:   -SSI  NEUROLOGIC AMS - in setting SIRS/sepsis - improved P:   -resume lamictal & seroquel post op  OK to transfer to SDU & will ask triad to assume care  I have personally obtained a history, examined the patient, evaluated laboratory and imaging results, formulated the assessment and plan and placed orders.  CRITICAL CARE: The patient is critically ill with multiple organ systems failure and requires high complexity decision  making for assessment and support, frequent evaluation and titration of therapies, application of advanced monitoring technologies and extensive interpretation of multiple databases. Critical Care Time devoted to patient care services described in this note is 40 minutes.    Cyril Mourning MD. Tonny Bollman. [place] Pager [phone] If no response call [phone]

## 2013-03-01 NOTE — Progress Notes (Signed)
eLink Physician-Brief Progress Note Patient Name: Sarah Leonard DOB: 10/11/1960 MRN: [id number]  Date of Service  03/01/2013   HPI/Events of Note  Patient with ongoing tachycardia with HR in the 120s with soft BP in the 80s systolic with MAP of 64.  Has received 1.5 liters of NS for BP support.   eICU Interventions  Plan: 1 liter of NS for BP support. Monitor HR/BP   Intervention Category Intermediate Interventions: Hypotension - evaluation and management;Arrhythmia - evaluation and management  DETERDING,ELIZABETH 03/01/2013, 2:30 AM

## 2013-03-01 NOTE — Anesthesia Preprocedure Evaluation (Addendum)
Anesthesia Evaluation  Patient identified by MRN, date of birth, ID band Patient awake    Reviewed: Allergy & Precautions, H&P , NPO status , Patient's Chart, lab work & pertinent test results  Airway Mallampati: I TM Distance: >3 FB Neck ROM: Full    Dental   Pulmonary          Cardiovascular hypertension, Pt. on medications     Neuro/Psych    GI/Hepatic   Endo/Other  diabetes, Type 2, Oral Hypoglycemic Agents  Renal/GU Renal InsufficiencyRenal disease     Musculoskeletal   Abdominal   Peds  Hematology   Anesthesia Other Findings   Reproductive/Obstetrics                          Anesthesia Physical Anesthesia Plan  ASA: III  Anesthesia Plan: General   Post-op Pain Management:    Induction: Intravenous  Airway Management Planned: Oral ETT  Additional Equipment:   Intra-op Plan:   Post-operative Plan: Extubation in OR  Informed Consent: I have reviewed the patients History and Physical, chart, labs and discussed the procedure including the risks, benefits and alternatives for the proposed anesthesia with the patient or authorized representative who has indicated his/her understanding and acceptance.     Plan Discussed with: CRNA and Surgeon  Anesthesia Plan Comments:         Anesthesia Quick Evaluation

## 2013-03-01 NOTE — Transfer of Care (Signed)
Immediate Anesthesia Transfer of Care Note  Patient: Sarah Leonard  Procedure(s) Performed: Procedure(s): INCISION AND DRAINAGE ABSCESS (Right)  Patient Location: PACU  Anesthesia Type:General  Level of Consciousness: awake, alert  and oriented  Airway & Oxygen Therapy: Patient Spontanous Breathing and Patient connected to nasal cannula oxygen  Post-op Assessment: Report given to PACU RN, Post -op Vital signs reviewed and stable and Patient moving all extremities  Post vital signs: Reviewed and stable  Complications: No apparent anesthesia complications

## 2013-03-01 NOTE — Progress Notes (Signed)
  Subjective: Still with significant pain in the right axilla.  Remains tachycardic  Objective: Vital signs in last 24 hours: Temp:  [98.4 F (36.9 C)-103 F (39.4 C)] 103 F (39.4 C) (07/22 0420) Pulse Rate:  [39-140] 120 (07/22 0700) Resp:  [16-35] 27 (07/22 0700) BP: (61-142)/(20-113) 102/39 mmHg (07/22 0700) SpO2:  [77 %-100 %] 94 % (07/22 0700) Arterial Line BP: (67-115)/(40-72) 97/47 mmHg (07/22 0700) Weight:  [210 lb (95.255 kg)-229 lb 15 oz (104.3 kg)] 229 lb 15 oz (104.3 kg) (07/21 1340) Last BM Date: 02/27/13  Intake/Output from previous day: 07/21 0701 - 07/22 0700 In: 4535 [P.O.:960; I.V.:1775; IV Piggyback:1800] Out: 2000 [Urine:2000] Intake/Output this shift:    Remains tender in the right axilla with erythema and induration (worsening)  Lab Results:   Recent Labs  02/28/13 2000 03/01/13 0450  WBC 12.1* 10.6*  HGB 9.6* 9.3*  HCT 27.1* 26.7*  PLT 165 161   BMET  Recent Labs  02/28/13 2000 03/01/13 0450  NA 128* 133*  K 4.0 3.7  CL 98 100  CO2 19 20  GLUCOSE 189* 199*  BUN 32* 25*  CREATININE 1.96* 1.40*  CALCIUM 7.9* 7.6*   PT/INR No results found for this basename: LABPROT, INR,  in the last 72 hours ABG  Recent Labs  02/28/13 2054  PHART 7.370  HCO3 17.9*    Studies/Results: Dg Chest 2 View  02/28/2013   *RADIOLOGY REPORT*  Clinical Data: Wheezing  CHEST - 2 VIEW  Comparison: 08/21/2012  Findings: Cardiomegaly with slight worsening diffuse interstitial pattern throughout the lungs versus developing edema.  Lateral view is limited with motion artifact.  No definite collapse consolidation.  No large effusion or pneumothorax.  Trachea is midline.  Degenerative changes of the spine.  IMPRESSION: Cardiomegaly with increased interstitial pattern concerning for developing edema.   Original Report Authenticated By: Judie Petit. Miles Costain, M.D.    Anti-infectives: Anti-infectives   Start     Dose/Rate Route Frequency Ordered Stop   03/01/13 1400   vancomycin (VANCOCIN) IVPB 1000 mg/200 mL premix     1,000 mg 200 mL/hr over 60 Minutes Intravenous Every 24 hours 02/28/13 1247     02/28/13 2000  piperacillin-tazobactam (ZOSYN) IVPB 3.375 g     3.375 g 12.5 mL/hr over 240 Minutes Intravenous Every 8 hours 02/28/13 1247     02/28/13 1300  vancomycin (VANCOCIN) IVPB 1000 mg/200 mL premix     1,000 mg 200 mL/hr over 60 Minutes Intravenous  Once 02/28/13 1247 02/28/13 1531   02/28/13 1115  vancomycin (VANCOCIN) IVPB 1000 mg/200 mL premix     1,000 mg 200 mL/hr over 60 Minutes Intravenous  Once 02/28/13 1100 02/28/13 1324   02/28/13 1115  piperacillin-tazobactam (ZOSYN) IVPB 3.375 g     3.375 g 12.5 mL/hr over 240 Minutes Intravenous  Once 02/28/13 1100 02/28/13 1624      Assessment/Plan: s/p * No surgery found * Given lack of significant improvement in the right axilla, will need I and D in the OR today.  I discussed this with the patient and she agrees to proceed.  LOS: 1 day    Ralpheal Zappone A 03/01/2013

## 2013-03-01 NOTE — Anesthesia Procedure Notes (Signed)
Date/Time: 03/01/2013 2:08 PM Performed by: Orvilla Fus A Pre-anesthesia Checklist: Patient identified, Emergency Drugs available, Suction available, Patient being monitored and Timeout performed Patient Re-evaluated:Patient Re-evaluated prior to inductionOxygen Delivery Method: Circle system utilized Preoxygenation: Pre-oxygenation with 100% oxygen Intubation Type: IV induction Ventilation: Mask ventilation without difficulty and Oral airway inserted - appropriate to patient size Dental Injury: Teeth and Oropharynx as per pre-operative assessment  Comments: Mask ventilated patient for procedure

## 2013-03-01 NOTE — Progress Notes (Signed)
ANTIBIOTIC CONSULT NOTE - Follow-Up  Pharmacy Consult:  Vancomycin / Zosyn Indication:  Right axilla abscess  No Known Allergies  Patient Measurements: Height: 5\' 3"  (160 cm) Weight: 229 lb 15 oz (104.3 kg) IBW/kg (Calculated) : 52.4  Vital Signs: Temp: 102.5 F (39.2 C) (07/22 0900) Temp src: Oral (07/22 0900) BP: 121/53 mmHg (07/22 1000) Pulse Rate: 117 (07/22 0900)  Labs:  Recent Labs  02/28/13 0928 02/28/13 1239 02/28/13 2000 03/01/13 0450  WBC 22.1* 19.6* 12.1* 10.6*  HGB 10.9* 10.1* 9.6* 9.3*  PLT 224 190 165 161  CREATININE 2.99*  --  1.96* 1.40*   Estimated Creatinine Clearance: 54.3 ml/min (by C-G formula based on Cr of 1.4). No results found for this basename: VANCOTROUGH, Leodis Binet, VANCORANDOM, GENTTROUGH, GENTPEAK, GENTRANDOM, TOBRATROUGH, TOBRAPEAK, TOBRARND, AMIKACINPEAK, AMIKACINTROU, AMIKACIN,  in the last 72 hours   Microbiology: Recent Results (from the past 720 hour(s))  CULTURE, BLOOD (ROUTINE X 2)     Status: None   Collection Time    02/28/13 10:20 AM      Result Value Range Status   Specimen Description BLOOD RIGHT ANTECUBITAL   Final   Special Requests BOTTLES DRAWN AEROBIC AND ANAEROBIC 10CC   Final   Culture  Setup Time 02/28/2013 15:07   Final   Culture     Final   Value:        BLOOD CULTURE RECEIVED NO GROWTH TO DATE CULTURE WILL BE HELD FOR 5 DAYS BEFORE ISSUING A FINAL NEGATIVE REPORT   Report Status PENDING   Incomplete  CULTURE, BLOOD (ROUTINE X 2)     Status: None   Collection Time    02/28/13 10:30 AM      Result Value Range Status   Specimen Description BLOOD LEFT ANTECUBITAL   Final   Special Requests BOTTLES DRAWN AEROBIC ONLY 10CC   Final   Culture  Setup Time 02/28/2013 15:07   Final   Culture     Final   Value:        BLOOD CULTURE RECEIVED NO GROWTH TO DATE CULTURE WILL BE HELD FOR 5 DAYS BEFORE ISSUING A FINAL NEGATIVE REPORT   Report Status PENDING   Incomplete  MRSA PCR SCREENING     Status: Abnormal   Collection  Time    02/28/13  1:40 PM      Result Value Range Status   MRSA by PCR POSITIVE (*) NEGATIVE Final   Comment:            The GeneXpert MRSA Assay (FDA     approved for NASAL specimens     only), is one component of a     comprehensive MRSA colonization     surveillance program. It is not     intended to diagnose MRSA     infection nor to guide or     monitor treatment for     MRSA infections.     RESULT CALLED TO, READ BACK BY AND VERIFIED WITH:     K. SHARP RN 15:35 02/28/13 ([email or web address])   Assessment: Pt on Vanc/Zosyn Day #2 for right axilla abscess. I&D today. Tm 103. WBC down to 10.6. LA 3.75->1.3.  Vanc 7/21 >> Zosyn 7/21 >>  7/21 Urine>> 7/21 Bld x2>>   Goal of Therapy:  Vanc trough 15-20 mcg/mL   Plan:  - Change Vanc to 1.5gm IV Q24H - Zosyn 3.375gm IV Q8H, 4 hr infusion - Monitor renal fxn, clinical course, vanc trough at Css   Christoper Fabian, PharmD,  BCPS Clinical pharmacist, pager [phone] 03/01/2013, 10:50 AM

## 2013-03-02 ENCOUNTER — Encounter (HOSPITAL_COMMUNITY): Payer: Self-pay | Admitting: Surgery

## 2013-03-02 LAB — CBC
MCV: 93.9 fL (ref 78.0–100.0)
Platelets: 170 10*3/uL (ref 150–400)
RBC: 2.77 MIL/uL — ABNORMAL LOW (ref 3.87–5.11)
WBC: 10.9 10*3/uL — ABNORMAL HIGH (ref 4.0–10.5)

## 2013-03-02 LAB — BASIC METABOLIC PANEL
CO2: 24 mEq/L (ref 19–32)
Calcium: 7.9 mg/dL — ABNORMAL LOW (ref 8.4–10.5)
Chloride: 99 mEq/L (ref 96–112)
GFR calc Af Amer: >90 mL/min (ref >90)
Sodium: 133 mEq/L — ABNORMAL LOW (ref 135–145)

## 2013-03-02 LAB — MAGNESIUM: Magnesium: 2.2 mg/dL (ref 1.5–2.5)

## 2013-03-02 LAB — GLUCOSE, CAPILLARY
Glucose-Capillary: 138 mg/dL — ABNORMAL HIGH (ref 70–99)
Glucose-Capillary: 148 mg/dL — ABNORMAL HIGH (ref 70–99)
Glucose-Capillary: 150 mg/dL — ABNORMAL HIGH (ref 70–99)

## 2013-03-02 MED ORDER — TIOTROPIUM BROMIDE MONOHYDRATE 18 MCG IN CAPS
18.0000 ug | ORAL_CAPSULE | Freq: Every day | RESPIRATORY_TRACT | Status: DC
  Administered 2013-03-04 – 2013-03-05 (×2): 18 ug via RESPIRATORY_TRACT
  Filled 2013-03-02: qty 5

## 2013-03-02 NOTE — Progress Notes (Signed)
I have seen and examined the patient and agree with the assessment and plans.  Ceaira Ernster A. Nomi Rudnicki  MD, FACS  

## 2013-03-02 NOTE — Progress Notes (Signed)
Pt has been very noncompliant and restless, repeatedly getting OOB and removing leads, Nasal  Cannula, clothing and yelling excessively. Multiple staff members including this nurse have made several attempts to re-direct, situate, reposition and reassure her. She settles down for a few minutes and begins again. mg of Haldol was give at 2248 and was successful until pt awakened from sleep hours later. Currently she is sitting on the bottom edge of the bed refusing to lye back down. Pt is a/o and very much aware of her situation as stated by her. She also has been given 4mg  of IV morphine for c/o HA and back pain.

## 2013-03-02 NOTE — Progress Notes (Signed)
Patient ID: Sarah Leonard, female   DOB: Jun 05, 1961, 52 y.o.   MRN: [id number] 1 Day Post-Op  Subjective: Pt had a rough night. Having some pain.  Objective: Vital signs in last 24 hours: Temp:  [97.1 F (36.2 C)-102.5 F (39.2 C)] 99.5 F (37.5 C) (07/23 0453) Pulse Rate:  [28-118] 110 (07/23 0100) Resp:  [18-33] 25 (07/23 0100) BP: (74-121)/(33-68) 109/64 mmHg (07/23 0453) SpO2:  [83 %-99 %] 97 % (07/23 0453) Arterial Line BP: (76-103)/(44-74) 103/52 mmHg (07/22 1500) Weight:  [110 lb 8 oz (50.122 kg)] 110 lb 8 oz (50.122 kg) (07/23 0500) Last BM Date: 03/01/13  Intake/Output from previous day: 07/22 0701 - 07/23 0700 In: 3602.2 [P.O.:600; I.V.:2427.2; IV Piggyback:575] Out: 2025 [Urine:2025] Intake/Output this shift:    PE: Skin: right axilla still with some cellulitic changes as well as into her posterior upper arm.  Her wound is clean.  Packing removed and new packing replaced.  No purulent drainage noted.  Lab Results:   Recent Labs  03/01/13 0450 03/02/13 0520  WBC 10.6* 10.9*  HGB 9.3* 8.8*  HCT 26.7* 26.0*  PLT 161 170   BMET  Recent Labs  03/01/13 0450 03/02/13 0520  NA 133* 133*  K 3.7 3.8  CL 100 99  CO2 20 24  GLUCOSE 199* 158*  BUN 25* 11  CREATININE 1.40* 0.83  CALCIUM 7.6* 7.9*   PT/INR No results found for this basename: LABPROT, INR,  in the last 72 hours CMP     Component Value Date/Time   NA 133* 03/02/2013 0520   K 3.8 03/02/2013 0520   CL 99 03/02/2013 0520   CO2 24 03/02/2013 0520   GLUCOSE 158* 03/02/2013 0520   BUN 11 03/02/2013 0520   CREATININE 0.83 03/02/2013 0520   CALCIUM 7.9* 03/02/2013 0520   PROT 6.6 02/28/2013 0928   ALBUMIN 3.1* 02/28/2013 0928   AST 8 02/28/2013 0928   ALT 7 02/28/2013 0928   ALKPHOS 74 02/28/2013 0928   BILITOT 0.4 02/28/2013 0928   GFRNONAA 80* 03/02/2013 0520   GFRAA >90 03/02/2013 0520   Lipase     Component Value Date/Time   LIPASE 16 02/28/2013 0928       Studies/Results: Dg Chest 2  View  02/28/2013   *RADIOLOGY REPORT*  Clinical Data: Wheezing  CHEST - 2 VIEW  Comparison: 08/21/2012  Findings: Cardiomegaly with slight worsening diffuse interstitial pattern throughout the lungs versus developing edema.  Lateral view is limited with motion artifact.  No definite collapse consolidation.  No large effusion or pneumothorax.  Trachea is midline.  Degenerative changes of the spine.  IMPRESSION: Cardiomegaly with increased interstitial pattern concerning for developing edema.   Original Report Authenticated By: Judie Petit. Miles Costain, M.D.   Dg Chest Port 1 View  03/01/2013   *RADIOLOGY REPORT*  Clinical Data: Pulmonary edema and cough.  PORTABLE CHEST - 1 VIEW  Comparison: 02/28/2013  Findings: Single view of the chest was obtained.  Again noted are prominent interstitial markings.  Heart size is within normal limits and stable.  A few densities at the right lung base are suggestive for atelectasis.  The trachea is midline.  IMPRESSION: Subtle right basilar densities are suggestive for atelectasis.  Prominent interstitial lung markings may represent mild edema on chronic changes.   Original Report Authenticated By: Richarda Overlie, M.D.    Anti-infectives: Anti-infectives   Start     Dose/Rate Route Frequency Ordered Stop   03/01/13 1400  vancomycin (VANCOCIN) IVPB 1000 mg/200 mL  premix  Status:  Discontinued     1,000 mg 200 mL/hr over 60 Minutes Intravenous Every 24 hours 02/28/13 1247 03/01/13 0746   03/01/13 1400  vancomycin (VANCOCIN) 1,500 mg in sodium chloride 0.9 % 500 mL IVPB     1,500 mg 250 mL/hr over 120 Minutes Intravenous Every 24 hours 03/01/13 0746     02/28/13 2000  piperacillin-tazobactam (ZOSYN) IVPB 3.375 g     3.375 g 12.5 mL/hr over 240 Minutes Intravenous Every 8 hours 02/28/13 1247     02/28/13 1300  vancomycin (VANCOCIN) IVPB 1000 mg/200 mL premix     1,000 mg 200 mL/hr over 60 Minutes Intravenous  Once 02/28/13 1247 02/28/13 1531   02/28/13 1115  vancomycin (VANCOCIN) IVPB  1000 mg/200 mL premix     1,000 mg 200 mL/hr over 60 Minutes Intravenous  Once 02/28/13 1100 02/28/13 1324   02/28/13 1115  piperacillin-tazobactam (ZOSYN) IVPB 3.375 g     3.375 g 12.5 mL/hr over 240 Minutes Intravenous  Once 02/28/13 1100 02/28/13 1624       Assessment/Plan  1. Right axillary abscess, s/p I&D  Plan: 1. She still has some surrounding cellulitic changes to her axilla and her posterior arm.  Cont IV abx therapy.   2. Start NS WD BID dressing changes to right axillary wound.  Patient tolerated first dressing change quite well. 3. Will follow.   LOS: 2 days    Carmelle Bamberg E 03/02/2013, 7:54 AM Pager: [phone]

## 2013-03-03 ENCOUNTER — Inpatient Hospital Stay (HOSPITAL_COMMUNITY): Payer: Medicaid Other

## 2013-03-03 DIAGNOSIS — L02419 Cutaneous abscess of limb, unspecified: Secondary | ICD-10-CM

## 2013-03-03 LAB — VANCOMYCIN, TROUGH: Vancomycin Tr: 5.1 ug/mL — ABNORMAL LOW (ref 10.0–20.0)

## 2013-03-03 LAB — GLUCOSE, CAPILLARY
Glucose-Capillary: 126 mg/dL — ABNORMAL HIGH (ref 70–99)
Glucose-Capillary: 140 mg/dL — ABNORMAL HIGH (ref 70–99)

## 2013-03-03 MED ORDER — DULOXETINE HCL 60 MG PO CPEP
60.0000 mg | ORAL_CAPSULE | Freq: Every day | ORAL | Status: DC
  Administered 2013-03-03 – 2013-03-05 (×3): 60 mg via ORAL
  Filled 2013-03-03 (×3): qty 1

## 2013-03-03 MED ORDER — VANCOMYCIN HCL 10 G IV SOLR
1500.0000 mg | Freq: Two times a day (BID) | INTRAVENOUS | Status: DC
  Administered 2013-03-03: 1500 mg via INTRAVENOUS
  Filled 2013-03-03 (×2): qty 1500

## 2013-03-03 NOTE — Progress Notes (Signed)
I have seen and examined the patient and agree with the assessment and plans.  Jafeth Mustin A. Jonice Cerra  MD, FACS  

## 2013-03-03 NOTE — Progress Notes (Signed)
[facility] PROGRESS NOTE  Sarah Leonard ZOX:[id number] DOB: 28-Jun-1961 DOA: 02/28/2013 PCP: Default, Provider, MD  Assessment/Plan: Severe sepsis -Secondary to axillary abscess -Improved with fluid resuscitation -Blood cultures and urine cultures are negative -Chest x-ray suggests increasing right lower lobe opacity -Continue vancomycin and Zosyn -Blood pressure remains stable -WBC improving -Transferred to telemetry bed Right axillary abscess -Status post irrigation and debridement 03/01/2013 (Dr. Magnus Ivan) - Preliminary culture shows Staphylococcus aureus  -Continue vancomycin pending culture data  -Appreciate surgical assistance -Continue wound care wet-to-dry dressings  Acute kidney injury  -Likely combination of sepsis and volume depletion in conjunction with lisinopril  -Improved with IV fluids Hypertension  -Continue to hold antihypertensive medications  -Hold metoprolol and lisinopril -Blood pressure remains soft although stable  Diabetes mellitus type 2  -Check hemoglobin A1c  -Continue NovoLog sliding scale  Tobacco abuse with presumptive COPD  -Nearly 40-pack-year history  -Continue Spiriva and Advair/albuterol  -Tobacco cessation discussed Cocaine abuse  -cessation discussed Bipolar disorder  -Restart Cymbalta  -Continue Seroquel  Acute encephalopathy -Resolved  Family Communication:   Pt at beside Disposition Plan:   Home when medically stable   Antibiotics:  Vancomycin 02/28/2013>>>  Zosyn 02/28/2013>>>    Procedures/Studies: Dg Chest 2 View  02/28/2013   *RADIOLOGY REPORT*  Clinical Data: Wheezing  CHEST - 2 VIEW  Comparison: 08/21/2012  Findings: Cardiomegaly with slight worsening diffuse interstitial pattern throughout the lungs versus developing edema.  Lateral view is limited with motion artifact.  No definite collapse consolidation.  No large effusion or pneumothorax.  Trachea is midline.  Degenerative changes of the spine.   IMPRESSION: Cardiomegaly with increased interstitial pattern concerning for developing edema.   Original Report Authenticated By: Judie Petit. Miles Costain, M.D.   Dg Chest Port 1 View  03/01/2013   *RADIOLOGY REPORT*  Clinical Data: Pulmonary edema and cough.  PORTABLE CHEST - 1 VIEW  Comparison: 02/28/2013  Findings: Single view of the chest was obtained.  Again noted are prominent interstitial markings.  Heart size is within normal limits and stable.  A few densities at the right lung base are suggestive for atelectasis.  The trachea is midline.  IMPRESSION: Subtle right basilar densities are suggestive for atelectasis.  Prominent interstitial lung markings may represent mild edema on chronic changes.   Original Report Authenticated By: Richarda Overlie, M.D.         Subjective:  patient feels that she is breathing better. Complains of pain in the right axilla. Denies any dizziness, chest discomfort, nausea, vomiting, diarrhea, abdominal pain, dysuria.  Objective: Filed Vitals:   03/03/13 0500 03/03/13 0838 03/03/13 1231 03/03/13 1453  BP:  105/59 105/58   Pulse:  116 68   Temp:  99 F (37.2 C) 97.8 F (36.6 C)   TempSrc:  Oral Oral   Resp:  25 24   Height:      Weight: 109.9 kg (242 lb 4.6 oz)     SpO2:  90% 92% 96%    Intake/Output Summary (Last 24 hours) at 03/03/13 1512 Last data filed at 03/03/13 1232  Gross per 24 hour  Intake   2850 ml  Output   3300 ml  Net   -450 ml   Weight change: -0.1 kg (-3.5 oz) Exam:   General:  Pt is alert, follows commands appropriately, not in acute distress  HEENT: No icterus, No thrush,, [place]/AT  Cardiovascular: RRR, S1/S2, no rubs, no gallops  Respiratory: Bilateral expiratory wheeze. Bibasilar crackles. Good air movement.  Abdomen: Soft/+BS, non tender, non  distended, no guarding  Extremities: 2+ edema, No lymphangitis, No petechiae, No rashes, no synovitis; right axilla with yellowish drainage with erythema spreading to the triceps area--no necrosis,  no crepitance   Data Reviewed: Basic Metabolic Panel:  Recent Labs Lab 02/28/13 0928 02/28/13 2000 03/01/13 0450 03/01/13 2000 03/02/13 0520  NA 127* 128* 133*  --  133*  K 5.9* 4.0 3.7  --  3.8  CL 93* 98 100  --  99  CO2 18* 19 20  --  24  GLUCOSE 187* 189* 199*  --  158*  BUN 34* 32* 25*  --  11  CREATININE 2.99* 1.96* 1.40*  --  0.83  CALCIUM 8.8 7.9* 7.6*  --  7.9*  MG  --   --  1.3* 2.4 2.2  PHOS  --   --  3.0  --   --    Liver Function Tests:  Recent Labs Lab 02/28/13 0928  AST 8  ALT 7  ALKPHOS 74  BILITOT 0.4  PROT 6.6  ALBUMIN 3.1*    Recent Labs Lab 02/28/13 0928  LIPASE 16   No results found for this basename: AMMONIA,  in the last 168 hours CBC:  Recent Labs Lab 02/28/13 0928 02/28/13 1239 02/28/13 2000 03/01/13 0450 03/02/13 0520  WBC 22.1* 19.6* 12.1* 10.6* 10.9*  HGB 10.9* 10.1* 9.6* 9.3* 8.8*  HCT 31.6* 29.8* 27.1* 26.7* 26.0*  MCV 95.2 95.8 92.8 93.0 93.9  PLT 224 190 165 161 170   Cardiac Enzymes: No results found for this basename: CKTOTAL, CKMB, CKMBINDEX, TROPONINI,  in the last 168 hours BNP: No components found with this basename: POCBNP,  CBG:  Recent Labs Lab 03/02/13 1939 03/03/13 0014 03/03/13 0442 03/03/13 0818 03/03/13 1216  GLUCAP 174* 153* 126* 134* 114*    Recent Results (from the past 240 hour(s))  CULTURE, BLOOD (ROUTINE X 2)     Status: None   Collection Time    02/28/13 10:20 AM      Result Value Range Status   Specimen Description BLOOD RIGHT ANTECUBITAL   Final   Special Requests BOTTLES DRAWN AEROBIC AND ANAEROBIC 10CC   Final   Culture  Setup Time 02/28/2013 15:07   Final   Culture     Final   Value:        BLOOD CULTURE RECEIVED NO GROWTH TO DATE CULTURE WILL BE HELD FOR 5 DAYS BEFORE ISSUING A FINAL NEGATIVE REPORT   Report Status PENDING   Incomplete  CULTURE, BLOOD (ROUTINE X 2)     Status: None   Collection Time    02/28/13 10:30 AM      Result Value Range Status   Specimen  Description BLOOD LEFT ANTECUBITAL   Final   Special Requests BOTTLES DRAWN AEROBIC ONLY 10CC   Final   Culture  Setup Time 02/28/2013 15:07   Final   Culture     Final   Value:        BLOOD CULTURE RECEIVED NO GROWTH TO DATE CULTURE WILL BE HELD FOR 5 DAYS BEFORE ISSUING A FINAL NEGATIVE REPORT   Report Status PENDING   Incomplete  URINE CULTURE     Status: None   Collection Time    02/28/13  1:04 PM      Result Value Range Status   Specimen Description URINE, CATHETERIZED   Final   Special Requests NONE   Final   Culture  Setup Time 02/28/2013 13:38   Final   Colony Count NO  GROWTH   Final   Culture NO GROWTH   Final   Report Status 03/01/2013 FINAL   Final  MRSA PCR SCREENING     Status: Abnormal   Collection Time    02/28/13  1:40 PM      Result Value Range Status   MRSA by PCR POSITIVE (*) NEGATIVE Final   Comment:            The GeneXpert MRSA Assay (FDA     approved for NASAL specimens     only), is one component of a     comprehensive MRSA colonization     surveillance program. It is not     intended to diagnose MRSA     infection nor to guide or     monitor treatment for     MRSA infections.     RESULT CALLED TO, READ BACK BY AND VERIFIED WITH:     K. SHARP RN 15:35 02/28/13 ([email or web address])  CULTURE, ROUTINE-ABSCESS     Status: None   Collection Time    03/01/13  2:05 PM      Result Value Range Status   Specimen Description ABSCESS AXILLA RIGHT   Final   Special Requests NONE   Final   Gram Stain     Final   Value: RARE WBC PRESENT, PREDOMINANTLY PMN     NO SQUAMOUS EPITHELIAL CELLS SEEN     FEW GRAM POSITIVE COCCI     IN CLUSTERS   Culture     Final   Value: ABUNDANT STAPHYLOCOCCUS AUREUS     Note: RIFAMPIN AND GENTAMICIN SHOULD NOT BE USED AS SINGLE DRUGS FOR TREATMENT OF STAPH INFECTIONS.   Report Status PENDING   Incomplete  ANAEROBIC CULTURE     Status: None   Collection Time    03/01/13  2:05 PM      Result Value Range Status   Specimen Description ABSCESS  AXILLA RIGHT   Final   Special Requests NONE   Final   Gram Stain     Final   Value: RARE WBC PRESENT, PREDOMINANTLY PMN     NO SQUAMOUS EPITHELIAL CELLS SEEN     RARE GRAM POSITIVE COCCI     IN CLUSTERS   Culture     Final   Value: NO ANAEROBES ISOLATED; CULTURE IN PROGRESS FOR 5 DAYS   Report Status PENDING   Incomplete     Scheduled Meds: . albuterol  2.5 mg Nebulization Q6H  . Chlorhexidine Gluconate Cloth  6 each Topical Q0600  . heparin  5,000 Units Subcutaneous Q8H  . insulin aspart  0-20 Units Subcutaneous Q4H  . lamoTRIgine  125 mg Oral BID  . mupirocin ointment  1 application Nasal BID  . piperacillin-tazobactam (ZOSYN)  IV  3.375 g Intravenous Q8H  . QUEtiapine  200 mg Oral BID  . tiotropium  18 mcg Inhalation Daily  . vancomycin  1,500 mg Intravenous Q24H   Continuous Infusions: . lactated ringers 50 mL/hr at 03/01/13 1319     Chiron Campione, DO  [facility] Pager [phone]  If 7PM-7AM, please contact night-coverage [email or web address] Password [email or web address] 03/03/2013, 3:12 PM   LOS: 3 days

## 2013-03-03 NOTE — Progress Notes (Signed)
ANTIBIOTIC CONSULT NOTE - Follow-Up  Pharmacy Consult:  Vancomycin  Indication:  Right axilla abscess  No Known Allergies  Labs:  Recent Labs  02/28/13 2000 03/01/13 0450 03/02/13 0520  WBC 12.1* 10.6* 10.9*  HGB 9.6* 9.3* 8.8*  PLT 165 161 170  CREATININE 1.96* 1.40* 0.83   Estimated Creatinine Clearance: 93.3 ml/min (by C-G formula based on Cr of 0.83).  Recent Labs  03/03/13 1458  VANCOTROUGH 5.1*     Microbiology: Recent Results (from the past 720 hour(s))  CULTURE, BLOOD (ROUTINE X 2)     Status: None   Collection Time    02/28/13 10:20 AM      Result Value Range Status   Specimen Description BLOOD RIGHT ANTECUBITAL   Final   Special Requests BOTTLES DRAWN AEROBIC AND ANAEROBIC 10CC   Final   Culture  Setup Time 02/28/2013 15:07   Final   Culture     Final   Value:        BLOOD CULTURE RECEIVED NO GROWTH TO DATE CULTURE WILL BE HELD FOR 5 DAYS BEFORE ISSUING A FINAL NEGATIVE REPORT   Report Status PENDING   Incomplete  CULTURE, BLOOD (ROUTINE X 2)     Status: None   Collection Time    02/28/13 10:30 AM      Result Value Range Status   Specimen Description BLOOD LEFT ANTECUBITAL   Final   Special Requests BOTTLES DRAWN AEROBIC ONLY 10CC   Final   Culture  Setup Time 02/28/2013 15:07   Final   Culture     Final   Value:        BLOOD CULTURE RECEIVED NO GROWTH TO DATE CULTURE WILL BE HELD FOR 5 DAYS BEFORE ISSUING A FINAL NEGATIVE REPORT   Report Status PENDING   Incomplete  URINE CULTURE     Status: None   Collection Time    02/28/13  1:04 PM      Result Value Range Status   Specimen Description URINE, CATHETERIZED   Final   Special Requests NONE   Final   Culture  Setup Time 02/28/2013 13:38   Final   Colony Count NO GROWTH   Final   Culture NO GROWTH   Final   Report Status 03/01/2013 FINAL   Final  MRSA PCR SCREENING     Status: Abnormal   Collection Time    02/28/13  1:40 PM      Result Value Range Status   MRSA by PCR POSITIVE (*) NEGATIVE Final    Comment:            The GeneXpert MRSA Assay (FDA     approved for NASAL specimens     only), is one component of a     comprehensive MRSA colonization     surveillance program. It is not     intended to diagnose MRSA     infection nor to guide or     monitor treatment for     MRSA infections.     RESULT CALLED TO, READ BACK BY AND VERIFIED WITH:     K. SHARP RN 15:35 02/28/13 ([email or web address])  CULTURE, ROUTINE-ABSCESS     Status: None   Collection Time    03/01/13  2:05 PM      Result Value Range Status   Specimen Description ABSCESS AXILLA RIGHT   Final   Special Requests NONE   Final   Gram Stain     Final   Value: RARE WBC PRESENT,  PREDOMINANTLY PMN     NO SQUAMOUS EPITHELIAL CELLS SEEN     FEW GRAM POSITIVE COCCI     IN CLUSTERS   Culture     Final   Value: ABUNDANT STAPHYLOCOCCUS AUREUS     Note: RIFAMPIN AND GENTAMICIN SHOULD NOT BE USED AS SINGLE DRUGS FOR TREATMENT OF STAPH INFECTIONS.   Report Status PENDING   Incomplete  ANAEROBIC CULTURE     Status: None   Collection Time    03/01/13  2:05 PM      Result Value Range Status   Specimen Description ABSCESS AXILLA RIGHT   Final   Special Requests NONE   Final   Gram Stain     Final   Value: RARE WBC PRESENT, PREDOMINANTLY PMN     NO SQUAMOUS EPITHELIAL CELLS SEEN     RARE GRAM POSITIVE COCCI     IN CLUSTERS   Culture     Final   Value: NO ANAEROBES ISOLATED; CULTURE IN PROGRESS FOR 5 DAYS   Report Status PENDING   Incomplete   Assessment: Pt on Vanc/Zosyn Day # 4 for right axilla abscess.  Vancomycin trough = 5.1 this evening, subtherapeutic  Goal of Therapy:  Vanc trough 15-20 mcg/mL  Plan:  - Increase vancomycin to 1500 mg iv Q 12 hours - Continue to follow  Thank you. Okey Regal, PharmD [phone] 03/03/2013, 4:13 PM

## 2013-03-03 NOTE — Clinical Documentation Improvement (Signed)
This needs to go to CCM or primary admitting physician

## 2013-03-03 NOTE — Progress Notes (Signed)
2 Days Post-Op  Subjective: Pt states she wants to go home, asking why she cannot go home.  I explained to her that she still needs IV atbx due to the infection. Appetite is fair.  Last bm yesterday.  Able to ambulate in the room.  Denies chills or sweats.  Objective: Vital signs in last 24 hours: Temp:  [98.4 F (36.9 C)-101 F (38.3 C)] 98.4 F (36.9 C) (07/24 0443) Pulse Rate:  [58-111] 58 (07/24 0018) Resp:  [15-26] 26 (07/24 0445) BP: (93-111)/(51-67) 94/67 mmHg (07/24 0445) SpO2:  [88 %-100 %] 88 % (07/24 0018) Weight:  [242 lb 4.6 oz (109.9 kg)] 242 lb 4.6 oz (109.9 kg) (07/24 0500) Last BM Date: 03/01/13  Intake/Output from previous day: 07/23 0701 - 07/24 0700 In: 4150 [I.V.:3450; IV Piggyback:700] Out: 1501 [Urine:1500; Stool:1] Intake/Output this shift:    General appearance: alert, cooperative, appears older than stated age and no distress Resp: clear to auscultation bilaterally Cardio: regular rate and rhythm, S1, S2 normal, no murmur, click, rub or gallop GI: soft, non-tender; bowel sounds normal; no masses,  no organomegaly Skin: Skin color, texture, turgor normal. No rashes or lesions or right axilla with cellulitic changes extending to the arm.  Dressing soaked with purulent drainage.  Lab Results:   Recent Labs  03/01/13 0450 03/02/13 0520  WBC 10.6* 10.9*  HGB 9.3* 8.8*  HCT 26.7* 26.0*  PLT 161 170   BMET  Recent Labs  03/01/13 0450 03/02/13 0520  NA 133* 133*  K 3.7 3.8  CL 100 99  CO2 20 24  GLUCOSE 199* 158*  BUN 25* 11  CREATININE 1.40* 0.83  CALCIUM 7.6* 7.9*   ABG  Recent Labs  02/28/13 2054  PHART 7.370  HCO3 17.9*    Anti-infectives: Anti-infectives   Start     Dose/Rate Route Frequency Ordered Stop   03/01/13 1400  vancomycin (VANCOCIN) IVPB 1000 mg/200 mL premix  Status:  Discontinued     1,000 mg 200 mL/hr over 60 Minutes Intravenous Every 24 hours 02/28/13 1247 03/01/13 0746   03/01/13 1400  vancomycin (VANCOCIN)  1,500 mg in sodium chloride 0.9 % 500 mL IVPB     1,500 mg 250 mL/hr over 120 Minutes Intravenous Every 24 hours 03/01/13 0746     02/28/13 2000  piperacillin-tazobactam (ZOSYN) IVPB 3.375 g     3.375 g 12.5 mL/hr over 240 Minutes Intravenous Every 8 hours 02/28/13 1247     02/28/13 1300  vancomycin (VANCOCIN) IVPB 1000 mg/200 mL premix     1,000 mg 200 mL/hr over 60 Minutes Intravenous  Once 02/28/13 1247 02/28/13 1531   02/28/13 1115  vancomycin (VANCOCIN) IVPB 1000 mg/200 mL premix     1,000 mg 200 mL/hr over 60 Minutes Intravenous  Once 02/28/13 1100 02/28/13 1324   02/28/13 1115  piperacillin-tazobactam (ZOSYN) IVPB 3.375 g     3.375 g 12.5 mL/hr over 240 Minutes Intravenous  Once 02/28/13 1100 02/28/13 1624      Assessment/Plan: Right axillary abscess, s/p I&D(03/01/13 Dr. Magnus Ivan) -continue with IV atbx -continue with NS WD bid dressing changes and as needed.  Premedicate with oral pain medication -stable to transfer to med/surg floor from surgical standpoint.   LOS: 3 days   [facility] ANP-BC Pager [phone]  03/03/2013 8:09 AM

## 2013-03-03 NOTE — Clinical Documentation Improvement (Signed)
THIS DOCUMENT IS NOT A PERMANENT PART OF THE MEDICAL RECORD  Please update your documentation with the medical record to reflect your response to this query. If you need help knowing how to do this please call [phone].  03/03/13   Dear Dr. [facility]:  In a better effort to capture your patient's severity of illness, reflect appropriate length of stay and utilization of resources, a review of the patient medical record has revealed: Shock documented by Dr. Sherene Sires in 7/21 Electronic ICU Physician Note.    Pt admitted with sepsis secondary to axilla abscess  His treatment plan included "NS x 500 since still oxygenating well; no pressors or CVL     Based on your clinical judgment, please clarify/specify in progress note and discharge summary the likely type of shock patient was treated for.   " Septic Shock  " Cardiogenic Shock  " Hypovolemic Shock  " Other Condition_______________  " Cannot Clinically determine    In responding to this query please exercise your independent judgment.  The fact that a query is asked, does not imply that any particular answer is desired or expected.    Reviewed: Dr Tat documented septic shock in his d/c summary  Thank You,  Shellee Milo  Clinical Documentation Specialist: [phone] Health Information Management [place]

## 2013-03-03 NOTE — Progress Notes (Signed)
Dressing changed to rt. Arm pit. Pt. Premedicated with Morphine. Pt. Uncomfortable and tol fair due to large amt of packing that was removed. Foul smelling odor.

## 2013-03-04 LAB — CULTURE, ROUTINE-ABSCESS

## 2013-03-04 LAB — BASIC METABOLIC PANEL
BUN: 7 mg/dL (ref 6–23)
GFR calc non Af Amer: >90 mL/min (ref >90)
Glucose, Bld: 136 mg/dL — ABNORMAL HIGH (ref 70–99)
Potassium: 3.3 mEq/L — ABNORMAL LOW (ref 3.5–5.1)

## 2013-03-04 LAB — GLUCOSE, CAPILLARY
Glucose-Capillary: 111 mg/dL — ABNORMAL HIGH (ref 70–99)
Glucose-Capillary: 119 mg/dL — ABNORMAL HIGH (ref 70–99)
Glucose-Capillary: 127 mg/dL — ABNORMAL HIGH (ref 70–99)

## 2013-03-04 LAB — CBC
Hemoglobin: 8.9 g/dL — ABNORMAL LOW (ref 12.0–15.0)
MCH: 32.7 pg (ref 26.0–34.0)
MCHC: 35.6 g/dL (ref 30.0–36.0)

## 2013-03-04 LAB — HEMOGLOBIN A1C: Hgb A1c MFr Bld: 6.9 % — ABNORMAL HIGH (ref <5.7)

## 2013-03-04 MED ORDER — INSULIN ASPART 100 UNIT/ML ~~LOC~~ SOLN
0.0000 [IU] | Freq: Three times a day (TID) | SUBCUTANEOUS | Status: DC
  Administered 2013-03-05: 3 [IU] via SUBCUTANEOUS

## 2013-03-04 MED ORDER — POTASSIUM CHLORIDE CRYS ER 20 MEQ PO TBCR
20.0000 meq | EXTENDED_RELEASE_TABLET | Freq: Every day | ORAL | Status: DC
  Administered 2013-03-04: 20 meq via ORAL
  Filled 2013-03-04 (×2): qty 1

## 2013-03-04 MED ORDER — VANCOMYCIN HCL 10 G IV SOLR
1500.0000 mg | Freq: Two times a day (BID) | INTRAVENOUS | Status: DC
  Administered 2013-03-04 – 2013-03-05 (×3): 1500 mg via INTRAVENOUS
  Filled 2013-03-04 (×5): qty 1500

## 2013-03-04 MED ORDER — INSULIN ASPART 100 UNIT/ML ~~LOC~~ SOLN: 0.0000 [IU] | Freq: Every day | SUBCUTANEOUS | Status: DC

## 2013-03-04 MED ORDER — FUROSEMIDE 20 MG PO TABS
20.0000 mg | ORAL_TABLET | Freq: Two times a day (BID) | ORAL | Status: DC
  Administered 2013-03-04 – 2013-03-05 (×2): 20 mg via ORAL
  Filled 2013-03-04 (×5): qty 1

## 2013-03-04 MED ORDER — PIPERACILLIN-TAZOBACTAM 3.375 G IVPB
3.3750 g | Freq: Three times a day (TID) | INTRAVENOUS | Status: DC
  Administered 2013-03-04 (×2): 3.375 g via INTRAVENOUS
  Filled 2013-03-04 (×4): qty 50

## 2013-03-04 NOTE — Progress Notes (Signed)
Pt remains high fall risk with minimal impulse control. Afebrile. No pain med given in AM. Pt appears extremely lethergic . Dsg remains intact with no drainage.

## 2013-03-04 NOTE — Progress Notes (Signed)
[facility] PROGRESS NOTE  Sarah Leonard UXL:[id number] DOB: [birth year] DOA: 02/28/2013 PCP: Default, Provider, MD  Assessment/Plan: Severe sepsis  -Secondary to axillary abscess  -Improved with fluid resuscitation  -Blood cultures and urine cultures are negative  -03/02/1999 410 Chest x-ray suggests increasing right lower lobe opacity  -Continue vancomycin -Continue Zosyn -Blood pressure remains stable/improved -WBC improving  Right axillary abscess  -Wound culture growing MRSA -Status post irrigation and debridement 03/01/2013 (Dr. Magnus Ivan)  - Preliminary culture shows Staphylococcus aureus  -Continue vancomycin -If renal function remains stable, plan to discharge home with Bactrim DS 2 tabs twice a day -Appreciate surgical assistance   -Continue wound care wet-to-dry dressings  Acute kidney injury  -Likely combination of sepsis and volume depletion in conjunction with lisinopril  -Improved, back to baseline -Improved with IV fluids  Hypertension  -Continue to hold antihypertensive medications  -Hold metoprolol and lisinopril  -Blood pressure remains soft although stable  -Restart furosemide 20 mg twice a day po Diabetes mellitus type 2  -Check hemoglobin A1c--6.9 -Continue NovoLog sliding scale  -Patient stated that she takes Lantus 12 units at bedtime Tobacco abuse with presumptive COPD  -Nearly 40-pack-year history  -Continue Spiriva and Advair/albuterol  -Tobacco cessation discussed  Cocaine abuse  -cessation discussed  Bipolar disorder  -Restart Cymbalta  -Continue Seroquel  Acute encephalopathy  -Resolved  Hypokalemia -Replete -Check magnesium Family Communication: Pt at beside  Disposition Plan: Discharged to [facility] 03/05/2013 if stable Antibiotics:  Vancomycin 02/28/2013>>>  Zosyn 02/28/2013>>> 03/04/2013         Procedures/Studies: Dg Chest 2 View  03/03/2013   *RADIOLOGY REPORT*  Clinical Data: Hypoxia; COPD  CHEST - 2 VIEW   Comparison:  March 01, 2013  Findings:  There is no edema or consolidation.  Heart is upper normal in size with normal pulmonary vascularity.  No adenopathy. There is degenerative change in the thoracic spine.  IMPRESSION:   No edema or consolidation.   Original Report Authenticated By: Bretta Bang, M.D.   Dg Chest 2 View  02/28/2013   *RADIOLOGY REPORT*  Clinical Data: Wheezing  CHEST - 2 VIEW  Comparison: 08/21/2012  Findings: Cardiomegaly with slight worsening diffuse interstitial pattern throughout the lungs versus developing edema.  Lateral view is limited with motion artifact.  No definite collapse consolidation.  No large effusion or pneumothorax.  Trachea is midline.  Degenerative changes of the spine.  IMPRESSION: Cardiomegaly with increased interstitial pattern concerning for developing edema.   Original Report Authenticated By: Judie Petit. Miles Costain, M.D.   Dg Chest Port 1 View  03/01/2013   *RADIOLOGY REPORT*  Clinical Data: Pulmonary edema and cough.  PORTABLE CHEST - 1 VIEW  Comparison: 02/28/2013  Findings: Single view of the chest was obtained.  Again noted are prominent interstitial markings.  Heart size is within normal limits and stable.  A few densities at the right lung base are suggestive for atelectasis.  The trachea is midline.  IMPRESSION: Subtle right basilar densities are suggestive for atelectasis.  Prominent interstitial lung markings may represent mild edema on chronic changes.   Original Report Authenticated By: Richarda Overlie, M.D.         Subjective: Patient is feeling better. Less pain in the right axilla. Denies any fevers, chills, chest discomfort, shortness breath, nausea, vomiting, diarrhea, abdominal pain.  Objective: Filed Vitals:   03/04/13 0729 03/04/13 0800 03/04/13 1140 03/04/13 1303  BP:  106/55 132/66 115/76  Pulse:  90 101 92  Temp:  98.6 F (37 C) 99 F (37.2  C) 98.4 F (36.9 C)  TempSrc:  Oral Oral Oral  Resp:  18 21 20   Height:      Weight:      SpO2:  98% 93% 94% 96%    Intake/Output Summary (Last 24 hours) at 03/04/13 1422 Last data filed at 03/04/13 1356  Gross per 24 hour  Intake   1340 ml  Output   2200 ml  Net   -860 ml   Weight change:  Exam:   General:  Pt is alert, follows commands appropriately, not in acute distress  HEENT: No icterus, No thrush,  [place]/AT  Cardiovascular: RRR, S1/S2, no rubs, no gallops  Respiratory: Minimal basilar wheezing. No rhonchi. Good air movement.  Abdomen: Soft/+BS, non tender, non distended, no guarding  Extremities: Right axilla and right tricep area with mild edema. No lymphangitis. No crepitance, no necrosis  Data Reviewed: Basic Metabolic Panel:  Recent Labs Lab 02/28/13 0928 02/28/13 2000 03/01/13 0450 03/01/13 2000 03/02/13 0520 03/04/13 0630  NA 127* 128* 133*  --  133* 133*  K 5.9* 4.0 3.7  --  3.8 3.3*  CL 93* 98 100  --  99 101  CO2 18* 19 20  --  24 21  GLUCOSE 187* 189* 199*  --  158* 136*  BUN 34* 32* 25*  --  11 7  CREATININE 2.99* 1.96* 1.40*  --  0.83 0.67  CALCIUM 8.8 7.9* 7.6*  --  7.9* 8.5  MG  --   --  1.3* 2.4 2.2  --   PHOS  --   --  3.0  --   --   --    Liver Function Tests:  Recent Labs Lab 02/28/13 0928  AST 8  ALT 7  ALKPHOS 74  BILITOT 0.4  PROT 6.6  ALBUMIN 3.1*    Recent Labs Lab 02/28/13 0928  LIPASE 16   No results found for this basename: AMMONIA,  in the last 168 hours CBC:  Recent Labs Lab 02/28/13 1239 02/28/13 2000 03/01/13 0450 03/02/13 0520 03/04/13 0630  WBC 19.6* 12.1* 10.6* 10.9* 7.4  HGB 10.1* 9.6* 9.3* 8.8* 8.9*  HCT 29.8* 27.1* 26.7* 26.0* 25.0*  MCV 95.8 92.8 93.0 93.9 91.9  PLT 190 165 161 170 200   Cardiac Enzymes: No results found for this basename: CKTOTAL, CKMB, CKMBINDEX, TROPONINI,  in the last 168 hours BNP: No components found with this basename: POCBNP,  CBG:  Recent Labs Lab 03/03/13 1602 03/03/13 1954 03/03/13 2331 03/04/13 0320 03/04/13 1137  GLUCAP 153* 140* 127* 119* 152*     Recent Results (from the past 240 hour(s))  CULTURE, BLOOD (ROUTINE X 2)     Status: None   Collection Time    02/28/13 10:20 AM      Result Value Range Status   Specimen Description BLOOD RIGHT ANTECUBITAL   Final   Special Requests BOTTLES DRAWN AEROBIC AND ANAEROBIC 10CC   Final   Culture  Setup Time 02/28/2013 15:07   Final   Culture     Final   Value:        BLOOD CULTURE RECEIVED NO GROWTH TO DATE CULTURE WILL BE HELD FOR 5 DAYS BEFORE ISSUING A FINAL NEGATIVE REPORT   Report Status PENDING   Incomplete  CULTURE, BLOOD (ROUTINE X 2)     Status: None   Collection Time    02/28/13 10:30 AM      Result Value Range Status   Specimen Description BLOOD LEFT ANTECUBITAL  Final   Special Requests BOTTLES DRAWN AEROBIC ONLY 10CC   Final   Culture  Setup Time 02/28/2013 15:07   Final   Culture     Final   Value:        BLOOD CULTURE RECEIVED NO GROWTH TO DATE CULTURE WILL BE HELD FOR 5 DAYS BEFORE ISSUING A FINAL NEGATIVE REPORT   Report Status PENDING   Incomplete  URINE CULTURE     Status: None   Collection Time    02/28/13  1:04 PM      Result Value Range Status   Specimen Description URINE, CATHETERIZED   Final   Special Requests NONE   Final   Culture  Setup Time 02/28/2013 13:38   Final   Colony Count NO GROWTH   Final   Culture NO GROWTH   Final   Report Status 03/01/2013 FINAL   Final  MRSA PCR SCREENING     Status: Abnormal   Collection Time    02/28/13  1:40 PM      Result Value Range Status   MRSA by PCR POSITIVE (*) NEGATIVE Final   Comment:            The GeneXpert MRSA Assay (FDA     approved for NASAL specimens     only), is one component of a     comprehensive MRSA colonization     surveillance program. It is not     intended to diagnose MRSA     infection nor to guide or     monitor treatment for     MRSA infections.     RESULT CALLED TO, READ BACK BY AND VERIFIED WITH:     K. SHARP RN 15:35 02/28/13 ([email or web address])  CULTURE, ROUTINE-ABSCESS     Status:  None   Collection Time    03/01/13  2:05 PM      Result Value Range Status   Specimen Description ABSCESS AXILLA RIGHT   Final   Special Requests NONE   Final   Gram Stain     Final   Value: RARE WBC PRESENT, PREDOMINANTLY PMN     NO SQUAMOUS EPITHELIAL CELLS SEEN     FEW GRAM POSITIVE COCCI     IN CLUSTERS   Culture     Final   Value: ABUNDANT METHICILLIN RESISTANT STAPHYLOCOCCUS AUREUS     Note: RIFAMPIN AND GENTAMICIN SHOULD NOT BE USED AS SINGLE DRUGS FOR TREATMENT OF STAPH INFECTIONS. This organism DOES NOT demonstrate inducible Clindamycin resistance in vitro. CRITICAL RESULT CALLED TO, READ BACK BY AND VERIFIED WITH: BERNEDETTE R      03/04/13 AT 1035 AM BY [facility]   Report Status 03/04/2013 FINAL   Final   Organism ID, Bacteria METHICILLIN RESISTANT STAPHYLOCOCCUS AUREUS   Final  ANAEROBIC CULTURE     Status: None   Collection Time    03/01/13  2:05 PM      Result Value Range Status   Specimen Description ABSCESS AXILLA RIGHT   Final   Special Requests NONE   Final   Gram Stain     Final   Value: RARE WBC PRESENT, PREDOMINANTLY PMN     NO SQUAMOUS EPITHELIAL CELLS SEEN     RARE GRAM POSITIVE COCCI     IN CLUSTERS   Culture     Final   Value: NO ANAEROBES ISOLATED; CULTURE IN PROGRESS FOR 5 DAYS   Report Status PENDING   Incomplete     Scheduled Meds: . albuterol  2.5 mg Nebulization Q6H  . Chlorhexidine Gluconate Cloth  6 each Topical Q0600  . DULoxetine  60 mg Oral Daily  . furosemide  20 mg Oral BID  . heparin  5,000 Units Subcutaneous Q8H  . insulin aspart  0-20 Units Subcutaneous Q4H  . lamoTRIgine  125 mg Oral BID  . mupirocin ointment  1 application Nasal BID  . potassium chloride  20 mEq Oral Daily  . QUEtiapine  200 mg Oral BID  . tiotropium  18 mcg Inhalation Daily  . vancomycin  1,500 mg Intravenous Q12H   Continuous Infusions:    Alley Neils, DO  [facility] Pager [phone]  If 7PM-7AM, please contact night-coverage [email or web address] Password  [email or web address] 03/04/2013, 2:22 PM   LOS: 4 days

## 2013-03-04 NOTE — Progress Notes (Signed)
3 Days Post-Op  Subjective: Comfortable, wants to go home  Objective: Vital signs in last 24 hours: Temp:  [97.8 F (36.6 C)-99 F (37.2 C)] 98 F (36.7 C) (07/25 0406) Pulse Rate:  [64-116] 90 (07/25 0406) Resp:  [19-28] 27 (07/25 0300) BP: (101-128)/(58-67) 128/59 mmHg (07/25 0406) SpO2:  [90 %-100 %] 98 % (07/25 0729) Last BM Date: 03/01/13  Intake/Output from previous day: 07/24 0701 - 07/25 0700 In: 140 [P.O.:140] Out: 4050 [Urine:4050] Intake/Output this shift:    Right axillary wound stable, induration less  Lab Results:   Recent Labs  03/02/13 0520 03/04/13 0630  WBC 10.9* 7.4  HGB 8.8* 8.9*  HCT 26.0* 25.0*  PLT 170 200   BMET  Recent Labs  03/02/13 0520 03/04/13 0630  NA 133* 133*  K 3.8 3.3*  CL 99 101  CO2 24 21  GLUCOSE 158* 136*  BUN 11 7  CREATININE 0.83 0.67  CALCIUM 7.9* 8.5   PT/INR No results found for this basename: LABPROT, INR,  in the last 72 hours ABG No results found for this basename: PHART, PCO2, PO2, HCO3,  in the last 72 hours  Studies/Results: Dg Chest 2 View  03/03/2013   *RADIOLOGY REPORT*  Clinical Data: Hypoxia; COPD  CHEST - 2 VIEW  Comparison:  March 01, 2013  Findings:  There is no edema or consolidation.  Heart is upper normal in size with normal pulmonary vascularity.  No adenopathy. There is degenerative change in the thoracic spine.  IMPRESSION:   No edema or consolidation.   Original Report Authenticated By: Bretta Bang, M.D.    Anti-infectives: Anti-infectives   Start     Dose/Rate Route Frequency Ordered Stop   03/04/13 0600  piperacillin-tazobactam (ZOSYN) IVPB 3.375 g     3.375 g 12.5 mL/hr over 240 Minutes Intravenous Every 8 hours 03/04/13 0323     03/04/13 0330  vancomycin (VANCOCIN) 1,500 mg in sodium chloride 0.9 % 500 mL IVPB     1,500 mg 250 mL/hr over 120 Minutes Intravenous Every 12 hours 03/04/13 0322     03/03/13 1630  vancomycin (VANCOCIN) 1,500 mg in sodium chloride 0.9 % 500 mL IVPB   Status:  Discontinued     1,500 mg 250 mL/hr over 120 Minutes Intravenous Every 12 hours 03/03/13 1612 03/04/13 0322   03/01/13 1400  vancomycin (VANCOCIN) IVPB 1000 mg/200 mL premix  Status:  Discontinued     1,000 mg 200 mL/hr over 60 Minutes Intravenous Every 24 hours 02/28/13 1247 03/01/13 0746   03/01/13 1400  vancomycin (VANCOCIN) 1,500 mg in sodium chloride 0.9 % 500 mL IVPB  Status:  Discontinued     1,500 mg 250 mL/hr over 120 Minutes Intravenous Every 24 hours 03/01/13 0746 03/03/13 1612   02/28/13 2000  piperacillin-tazobactam (ZOSYN) IVPB 3.375 g  Status:  Discontinued     3.375 g 12.5 mL/hr over 240 Minutes Intravenous Every 8 hours 02/28/13 1247 03/04/13 0323   02/28/13 1300  vancomycin (VANCOCIN) IVPB 1000 mg/200 mL premix     1,000 mg 200 mL/hr over 60 Minutes Intravenous  Once 02/28/13 1247 02/28/13 1531   02/28/13 1115  vancomycin (VANCOCIN) IVPB 1000 mg/200 mL premix     1,000 mg 200 mL/hr over 60 Minutes Intravenous  Once 02/28/13 1100 02/28/13 1324   02/28/13 1115  piperacillin-tazobactam (ZOSYN) IVPB 3.375 g     3.375 g 12.5 mL/hr over 240 Minutes Intravenous  Once 02/28/13 1100 02/28/13 1624      Assessment/Plan: s/p  Procedure(s): INCISION AND DRAINAGE ABSCESS (Right)  Ok to discharge from gen surg standpoint.  Wound arrange home health for bid (or at least q day) wet to dry dressing changes.  Wound keep on antibiotics for at least one more week and have her f/u with [facility] in 2 to 3 weeks. Will see prn while in hospital.  LOS: 4 days    Sarah Leonard A 03/04/2013

## 2013-03-04 NOTE — Care Management Note (Signed)
    Page 1 of 1   03/04/2013     2:10:00 PM   CARE MANAGEMENT NOTE 03/04/2013  Patient:  Sarah Leonard, Sarah Leonard   Account Number:  [id number]  Date Initiated:  03/01/2013  Documentation initiated by:  [facility]  Subjective/Objective Assessment:   Sepsis  Admitted from AL     DC Planning Services  CM consult      HH arranged  HH-1 RN      [facility] agency  [facility]   Status of service:  In process, will continue to follow  Per UR Regulation:  Reviewed for med. necessity/level of care/duration of stay  Comments:  ContactLyndee Hensen Sister [phone]  03/04/13 0981 [facility] RN MSN BSN CCM Received CM referral for home health for dsg changes - pt states she is to d/c today per surgery. Also noted pt will need an additional week of IV antibx.  Per attending, pt is not ready for discharge.  TC to [facility] to determine preferred provider for dsg changes. 1105  [facility] is preferred provider per [facility]. Talked with pt and she agrees to referral to [facility].  03/02/13 0920 Mycah Mcdougall RN MSN BSN CCM Notified CSW that pt is a resident of [facility].

## 2013-03-04 NOTE — Progress Notes (Signed)
Dressing changed to right axilla wound. Yellowish drainage on previous dressings removed. Packed with NS wet to dry dressing and covered with 4x4s then ABD pad.  Wound size = 5cm length x 0.5cm width x 1cm depth.  Dr. Arbutus Leas in and examined wound prior to redress

## 2013-03-04 NOTE — Clinical Social Work Psychosocial (Signed)
Clinical Social Work Department BRIEF PSYCHOSOCIAL ASSESSMENT 03/04/2013  Patient:  Sarah Leonard, Sarah Leonard     Account Number:  [id number]     Admit date:  02/28/2013  Clinical Social Worker:  Lavell Luster  Date/Time:  03/04/2013 04:43 PM  Referred by:  Physician  Date Referred:  03/04/2013 Referred for  Other - See comment   Other Referral:   Patient will be returning to ALF [facility].   Interview type:  Patient Other interview type:    PSYCHOSOCIAL DATA Living Status:  FACILITY Admitted from facility:  [facility] Level of care:  Assisted Living Primary support name:  Lyndee Hensen [phone] Primary support relationship to patient:  S[place] Degree of support available:   Strong support    CURRENT CONCERNS Current Concerns  Post-Acute Placement   Other Concerns:    SOCIAL WORK ASSESSMENT / PLAN CSW met with patient to discuss where she would be discharged after her hospital stay. Patient stated that she would like to go back to [facility]F. Patient stated that she likes [facility] and feels safe there. CSW informed patient that CSW will facilitate her discharge back to [facility] upon discharge.   Assessment/plan status:  No Further Intervention Required Other assessment/ plan:   Complete FL2   Information/referral to community resources:   Patient was informed that CSW will facilitate patient's discharge back to [facility].    PATIENTS/FAMILYS RESPONSE TO PLAN OF CARE: Patient was pleasant and is happy to go  back to [facility] ALF upon discharge. Patient stated that it will not be necessary to contact her family as she will keep them informed.       [name], [place], [place], [phone]

## 2013-03-04 NOTE — Progress Notes (Signed)
Received Sarah Leonard to room 4E 21 @ 1230.  Placed on 4E21 telemetry box and confirmed rhythm of SR with CCMD.  Skin warm and dry.  NS infusing @ 10cc/hr to left arm IV.  Assessment and vital signs done and documented.

## 2013-03-04 NOTE — Plan of Care (Signed)
Problem: Phase II Progression Outcomes Goal: Progress activity as tolerated unless otherwise ordered Outcome: Completed/Met Date Met:  03/04/13 Bedrest order increased to activity as tolerated today, 03/04/13 Goal: Obtain order to discontinue catheter if appropriate Outcome: Not Applicable Date Met:  03/04/13 Voids on own Goal: Other Phase II Outcomes/Goals Outcome: Completed/Met Date Met:  03/04/13 Diet increased from NPO except sips with meds to clear liquids.

## 2013-03-05 DIAGNOSIS — IMO0002 Reserved for concepts with insufficient information to code with codable children: Secondary | ICD-10-CM

## 2013-03-05 LAB — GLUCOSE, CAPILLARY
Glucose-Capillary: 138 mg/dL — ABNORMAL HIGH (ref 70–99)
Glucose-Capillary: 108 mg/dL — ABNORMAL HIGH (ref 70–99)
Glucose-Capillary: 121 mg/dL — ABNORMAL HIGH (ref 70–99)

## 2013-03-05 LAB — BASIC METABOLIC PANEL
BUN: 9 mg/dL (ref 6–23)
Chloride: 100 mEq/L (ref 96–112)
GFR calc Af Amer: 59 mL/min — ABNORMAL LOW (ref >90)
Potassium: 3.5 mEq/L (ref 3.5–5.1)

## 2013-03-05 MED ORDER — DOXYCYCLINE HYCLATE 100 MG PO TABS
100.0000 mg | ORAL_TABLET | Freq: Two times a day (BID) | ORAL | Status: DC
  Administered 2013-03-05: 100 mg via ORAL
  Filled 2013-03-05 (×2): qty 1

## 2013-03-05 MED ORDER — DOXYCYCLINE HYCLATE 100 MG PO TABS: 100.0000 mg | ORAL_TABLET | Freq: Two times a day (BID) | ORAL | Status: DC

## 2013-03-05 MED ORDER — OXYCODONE HCL 10 MG PO TABS: 10.0000 mg | ORAL_TABLET | ORAL | Status: DC | PRN

## 2013-03-05 NOTE — Progress Notes (Signed)
   CARE MANAGEMENT NOTE 03/05/2013  Patient:  Sarah Leonard, Sarah Leonard   Account Number:  [id number]  Date Initiated:  03/01/2013  Documentation initiated by:  [facility]  Subjective/Objective Assessment:   Sepsis  Admitted from AL     Action/Plan:   Anticipated DC Date:  03/04/2013   Anticipated DC Plan:  HOME/SELF CARE      DC Planning Services  CM consult      [facility] Choice  HOME HEALTH   Choice offered to / List presented to:  C-1 Patient        HH arranged  HH-1 RN      [facility] agency  [facility]   Status of service:  Completed, signed off Medicare Important Message given?   (If response is "NO", the following Medicare IM given date fields will be blank) Date Medicare IM given:   Date Additional Medicare IM given:    Discharge Disposition:  HOME W HOME HEALTH SERVICES  Per UR Regulation:  Reviewed for med. necessity/level of care/duration of stay  If discussed at Long Length of Stay Meetings, dates discussed:    Comments:  03/05/2013 1145 [facility] aware of pt's scheduled dc home today with [facility] RN for dsg changes. NCM spoke to pt and no other dc needs identified. Isidoro Donning RN CCM Case Mgmt phone [phone]  Contact:  [place] Breeding [phone]  03/04/13 0939 Verdis Prime RN MSN BSN CCM Received CM referral for home health for dsg changes - pt states she is to d/c today per surgery. Also noted pt will need an additional week of IV antibx.  Per attending, pt is not ready for discharge.  TC to [facility] to determine preferred provider for dsg changes. 1105  [facility] is preferred provider per [facility]. Talked with pt and she agrees to referral to [facility].  03/02/13 0920 Henrietta Mayo RN MSN BSN CCM Notified CSW that pt is a resident of [facility].

## 2013-03-05 NOTE — Discharge Summary (Signed)
Physician Discharge Summary  OZA OBERLE WGN:[id number] DOB: 1961-05-01 DOA: 02/28/2013  PCP: Default, Provider, MD  Admit date: 02/28/2013 Discharge date: 03/05/2013  Recommendations for Outpatient Follow-up:  1. Pt will need to follow up with PCP in 1 weeks post discharge 2. Please obtain BMP on 03/07/13 3. Please also check CBC 03/07/13 4. F/u with Dr. Abigail Miyamoto, general surgery in 2 weeks 5. Daily wet-to-dry dressing changes to her right axilla until the patient follows up with Dr. Abigail Miyamoto 6. Doxycycline 100 mg,po twice a day x10 days--start 03/05/2013   Discharge Diagnoses:  Active Problems:   Septic shock   Acute renal failure   Abscess   Acidosis   Hyperkalemia   Axillary abscess Severe sepsis  -Patient was initially hypotensive at the time of admission with a systolic blood pressure in the 60s. She was admitted to ICU. -Subsequently transferred to the step down unit on 03/02/2013 -Transferred here regular floor on 03/05/1999 410 -Secondary to axillary abscess  -Improved with fluid resuscitation  -Blood cultures and urine cultures are negative  -03/01/2013 Chest x-ray suggests increasing right lower lobe opacity  -Continue vancomycin  -Patient was initially on Zosyn empirically. This was discontinued after culture data showed MRSA from the patient's Paxil -Blood pressure remains stable/improved  -WBC improved Right axillary abscess  -Wound culture growing MRSA  -Status post irrigation and debridement 03/01/2013 (Dr.  Abigail Miyamoto)  -culture shows Staphylococcus aureus--MRSA -Continue vancomycin-patient received 5 days during the hospitalization -Patient is transitioned to oral doxycycline 100 mg by mouth twice a day -She will continue on doxycycline for 10 more days -Appreciate surgical assistance  -Continue wound care wet-to-dry dressings daily   until she follows up with Dr. Abigail Miyamoto Acute kidney injury  -Likely combination of sepsis  and volume depletion in conjunction with lisinopril  -Improved, back to baseline  -Improved with IV fluids  -The patient was restarted back on furosemide, but her serum creatinine increased. -The patient will not be discharged on furosemide. The patient was instructed to follow up with her primary care physician regarding restarting furosemide. -She denies any respiratory distress. Oxygen saturation on room air 100% Hypertension  -Continue to hold antihypertensive medications  -Hold metoprolol and lisinopril  -Blood pressure remains soft although stable  -Metoprolol and lisinopril were discontinued throughout the hospitalization  -The patient's blood pressures remained stable  -Her systolic blood pressures ranged in the 110-120 range  -She will need followup with her primary care provider regarding the need for future antihypertensive medication  Diabetes mellitus type 2  -Check hemoglobin A1c--6.9  -Continue NovoLog sliding scale  -Patient stated that she takes Lantus 12 units at bedtime  -However the Lantus was not on her preadmission medication list  -Her sugars were well controlled throughout the hospitalization  -She will not be discharged on any insulin  -She will need to follow up with her primary care provider regarding future need of diabetes medications  Tobacco abuse with presumptive COPD  -Nearly 40-pack-year history  -Continue Spiriva and Advair/albuterol  -Tobacco cessation discussed  Cocaine abuse  -cessation discussed  Bipolar disorder  -Restart Cymbalta  -Continue Seroquel  Acute encephalopathy  -Resolved  Hypokalemia  -Replete  -Check magnesium  Family Communication: Pt at beside  Disposition Plan: Discharged to [facility] 03/05/2013 if stable  Antibiotics:  Vancomycin 02/28/2013>>> 03/05/2013  Zosyn 02/28/2013>>> 03/04/2013 doxycycline 03/05/2013    allergies: No known drug allergies Discharge Condition: stable   Disposition:  Follow-up Information    Follow up with [facility]  health. (home health nurse)    Contact information:   [phone]      Follow up with [facility] A, MD In 2 weeks.   Contact information:   [address] [place] [address] [phone]       Diet:carb modified Wt Readings from Last 3 Encounters:  03/05/13 113.263 kg (249 lb 11.2 oz)  03/05/13 113.263 kg (249 lb 11.2 oz)    History of present illness:  52yo female with hx DM, HTN, bipolar lives in [facility]. Brought to ER 7/21 with AMS, hypotension, R underarm abscess. Found to have SBP 60's in ER. Per pt has had abscess x 2 days, c/o chills. Denies SOB, chest pain, hemoptysis, urinary symptoms, other skin lesions. PCCM called to admit.past medical history diabetes, hypertension, bipolar, alcohol abuse and pancreatitis presents to the emergency department via EMS with altered level of consciousness. Per nursing home, patient is "not acting her normal self" but does not give a specific timeframe as to when the abnormal behavior began. The nursing home staff had noticed she is not acting herself has been on dictation and just returned. Earlier this morning patient was saying that she sees [place] Doo in her bedroom. Nursing home states that patient's "talking out of her head". Patient is currently complaining of abdominal pain and pain under her right arm and believes she has an abscess. O2 sat on route in the low 90s on room air, increased to 95% on room air in the emergency department.      Consultants: CCM General surgery-Dr. Abigail Miyamoto  Discharge Exam: Filed Vitals:   03/05/13 0732  BP: 174/78  Pulse: 80  Temp: 98.4 F (36.9 C)  Resp: 18   Filed Vitals:   03/05/13 0107 03/05/13 0120 03/05/13 0732 03/05/13 0925  BP: 94/53  174/78   Pulse: 93  80   Temp: 99.1 F (37.3 C)  98.4 F (36.9 C)   TempSrc: Oral  Oral   Resp: 18  18   Height:      Weight:   113.263 kg (249 lb 11.2 oz)   SpO2: 92% 94% 99% 94%    General: A&O x 3, NAD, pleasant, cooperative Cardiovascular: RRR, no rub, no gallop, no S3 Respiratory: Scattered basilar wheezing. No respiratory distress. No rhonchi. Good air movement. Abdomen:soft, nontender, nondistended, positive bowel sounds Extremities: 1+LE edema, No lymphangitis, no petechiae; right axilla without any pus, lymphangitis, necrosis, crepitance; currently packed with gauze  Discharge Instructions      Discharge Orders   Future Orders Complete By Expires     Diet - low sodium heart healthy  As directed     Discharge instructions  As directed     Comments:      Daily wet-to-dry dressing changes on the right axilla wound until the patient follows up with general surgery Stop taking furosemide, lisinopril, potassium, and metoprolol tartrate until you follow up with her primary care provider and have your blood pressure rechecked    Increase activity slowly  As directed         Medication List    STOP taking these medications       furosemide 20 MG tablet  Commonly known as:  LASIX     HYDROcodone-acetaminophen 5-500 MG per tablet  Commonly known as:  VICODIN     lisinopril 40 MG tablet  Commonly known as:  PRINIVIL,ZESTRIL     metoprolol tartrate 25 MG tablet  Commonly known as:  [employer]  potassium chloride SA 20 MEQ tablet  Commonly known as:  K-DUR,KLOR-CON      TAKE these medications       acetaminophen 325 MG tablet  Commonly known as:  TYLENOL  Take 325 mg by mouth 3 (three) times daily. Prior to fish oil     albuterol 108 (90 BASE) MCG/ACT inhaler  Commonly known as:  PROVENTIL HFA;VENTOLIN HFA  Inhale 2 puffs into the lungs every 4 (four) hours as needed for wheezing or shortness of breath (With spacer). For shortness of breath     aspirin EC 81 MG tablet  Take 81 mg by mouth daily.     calcium carbonate 1250 MG tablet  Commonly known as:  OS-CAL - dosed in mg of elemental calcium  Take 1 tablet by mouth 2 (two) times daily.      cholecalciferol 1000 UNITS tablet  Commonly known as:  VITAMIN D  Take 1,000 Units by mouth daily.     docusate sodium 100 MG capsule  Commonly known as:  COLACE  Take 100 mg by mouth 2 (two) times daily.     doxycycline 100 MG tablet  Commonly known as:  VIBRA-TABS  Take 1 tablet (100 mg total) by mouth 2 (two) times daily.     DULoxetine 60 MG capsule  Commonly known as:  CYMBALTA  Take 120 mg by mouth daily.     Fluticasone-Salmeterol 250-50 MCG/DOSE Aepb  Commonly known as:  ADVAIR  Inhale 1 puff into the lungs every 12 (twelve) hours.     gabapentin 600 MG tablet  Commonly known as:  NEURONTIN  Take 600 mg by mouth 3 (three) times daily.     guaifenesin 100 MG/5ML syrup  Commonly known as:  ROBITUSSIN  Take 200 mg by mouth every 4 (four) hours as needed for cough.     lamoTRIgine 25 MG tablet  Commonly known as:  LAMICTAL  Take 25 mg by mouth 2 (two) times daily. Takes with 100mg  tablet     lamoTRIgine 100 MG tablet  Commonly known as:  LAMICTAL  Take 100 mg by mouth 2 (two) times daily. Takes with 25 mg tab     LORazepam 1 MG tablet  Commonly known as:  ATIVAN  Take 1 mg by mouth 3 (three) times daily.     niacin 500 MG CR tablet  Commonly known as:  NIASPAN  Take 500 mg by mouth at bedtime.     omeprazole 20 MG capsule  Commonly known as:  PRILOSEC  Take 20 mg by mouth daily before supper.     Oxycodone HCl 10 MG Tabs  Take 1 tablet (10 mg total) by mouth every 4 (four) hours as needed.     QUEtiapine 300 MG 24 hr tablet  Commonly known as:  SEROQUEL XR  Take 600 mg by mouth every evening.     simvastatin 40 MG tablet  Commonly known as:  ZOCOR  Take 40 mg by mouth daily.     tiotropium 18 MCG inhalation capsule  Commonly known as:  SPIRIVA  Place 18 mcg into inhaler and inhale daily.     zolpidem 10 MG tablet  Commonly known as:  AMBIEN  Take 5 mg by mouth at bedtime.         The results of significant diagnostics from this  hospitalization (including imaging, microbiology, ancillary and laboratory) are listed below for reference.    Significant Diagnostic Studies: Dg Chest 2 View  03/03/2013   *RADIOLOGY  REPORT*  Clinical Data: Hypoxia; COPD  CHEST - 2 VIEW  Comparison:  March 01, 2013  Findings:  There is no edema or consolidation.  Heart is upper normal in size with normal pulmonary vascularity.  No adenopathy. There is degenerative change in the thoracic spine.  IMPRESSION:   No edema or consolidation.   Original Report Authenticated By: Bretta Bang, M.D.   Dg Chest 2 View  02/28/2013   *RADIOLOGY REPORT*  Clinical Data: Wheezing  CHEST - 2 VIEW  Comparison: 08/21/2012  Findings: Cardiomegaly with slight worsening diffuse interstitial pattern throughout the lungs versus developing edema.  Lateral view is limited with motion artifact.  No definite collapse consolidation.  No large effusion or pneumothorax.  Trachea is midline.  Degenerative changes of the spine.  IMPRESSION: Cardiomegaly with increased interstitial pattern concerning for developing edema.   Original Report Authenticated By: Judie Petit. Miles Costain, M.D.   Dg Chest Port 1 View  03/01/2013   *RADIOLOGY REPORT*  Clinical Data: Pulmonary edema and cough.  PORTABLE CHEST - 1 VIEW  Comparison: 02/28/2013  Findings: Single view of the chest was obtained.  Again noted are prominent interstitial markings.  Heart size is within normal limits and stable.  A few densities at the right lung base are suggestive for atelectasis.  The trachea is midline.  IMPRESSION: Subtle right basilar densities are suggestive for atelectasis.  Prominent interstitial lung markings may represent mild edema on chronic changes.   Original Report Authenticated By: Richarda Overlie, M.D.     Microbiology: Recent Results (from the past 240 hour(s))  CULTURE, BLOOD (ROUTINE X 2)     Status: None   Collection Time    02/28/13 10:20 AM      Result Value Range Status   Specimen Description BLOOD RIGHT  ANTECUBITAL   Final   Special Requests BOTTLES DRAWN AEROBIC AND ANAEROBIC 10CC   Final   Culture  Setup Time 02/28/2013 15:07   Final   Culture     Final   Value:        BLOOD CULTURE RECEIVED NO GROWTH TO DATE CULTURE WILL BE HELD FOR 5 DAYS BEFORE ISSUING A FINAL NEGATIVE REPORT   Report Status PENDING   Incomplete  CULTURE, BLOOD (ROUTINE X 2)     Status: None   Collection Time    02/28/13 10:30 AM      Result Value Range Status   Specimen Description BLOOD LEFT ANTECUBITAL   Final   Special Requests BOTTLES DRAWN AEROBIC ONLY 10CC   Final   Culture  Setup Time 02/28/2013 15:07   Final   Culture     Final   Value:        BLOOD CULTURE RECEIVED NO GROWTH TO DATE CULTURE WILL BE HELD FOR 5 DAYS BEFORE ISSUING A FINAL NEGATIVE REPORT   Report Status PENDING   Incomplete  URINE CULTURE     Status: None   Collection Time    02/28/13  1:04 PM      Result Value Range Status   Specimen Description URINE, CATHETERIZED   Final   Special Requests NONE   Final   Culture  Setup Time 02/28/2013 13:38   Final   Colony Count NO GROWTH   Final   Culture NO GROWTH   Final   Report Status 03/01/2013 FINAL   Final  MRSA PCR SCREENING     Status: Abnormal   Collection Time    02/28/13  1:40 PM      Result  Value Range Status   MRSA by PCR POSITIVE (*) NEGATIVE Final   Comment:            The GeneXpert MRSA Assay (FDA     approved for NASAL specimens     only), is one component of a     comprehensive MRSA colonization     surveillance program. It is not     intended to diagnose MRSA     infection nor to guide or     monitor treatment for     MRSA infections.     RESULT CALLED TO, READ BACK BY AND VERIFIED WITH:     K. SHARP RN 15:35 02/28/13 ([email or web address])  CULTURE, ROUTINE-ABSCESS     Status: None   Collection Time    03/01/13  2:05 PM      Result Value Range Status   Specimen Description ABSCESS AXILLA RIGHT   Final   Special Requests NONE   Final   Gram Stain     Final   Value: RARE WBC  PRESENT, PREDOMINANTLY PMN     NO SQUAMOUS EPITHELIAL CELLS SEEN     FEW GRAM POSITIVE COCCI     IN CLUSTERS   Culture     Final   Value: ABUNDANT METHICILLIN RESISTANT STAPHYLOCOCCUS AUREUS     Note: RIFAMPIN AND GENTAMICIN SHOULD NOT BE USED AS SINGLE DRUGS FOR TREATMENT OF STAPH INFECTIONS. This organism DOES NOT demonstrate inducible Clindamycin resistance in vitro. CRITICAL RESULT CALLED TO, READ BACK BY AND VERIFIED WITH: BERNEDETTE R      03/04/13 AT 1035 AM BY [facility]   Report Status 03/04/2013 FINAL   Final   Organism ID, Bacteria METHICILLIN RESISTANT STAPHYLOCOCCUS AUREUS   Final  ANAEROBIC CULTURE     Status: None   Collection Time    03/01/13  2:05 PM      Result Value Range Status   Specimen Description ABSCESS AXILLA RIGHT   Final   Special Requests NONE   Final   Gram Stain     Final   Value: RARE WBC PRESENT, PREDOMINANTLY PMN     NO SQUAMOUS EPITHELIAL CELLS SEEN     RARE GRAM POSITIVE COCCI     IN CLUSTERS   Culture     Final   Value: NO ANAEROBES ISOLATED; CULTURE IN PROGRESS FOR 5 DAYS   Report Status PENDING   Incomplete     Labs: Basic Metabolic Panel:  Recent Labs Lab 02/28/13 2000 03/01/13 0450 03/01/13 2000 03/02/13 0520 03/04/13 0630 03/05/13 0425  NA 128* 133*  --  133* 133* 134*  K 4.0 3.7  --  3.8 3.3* 3.5  CL 98 100  --  99 101 100  CO2 19 20  --  24 21 24   GLUCOSE 189* 199*  --  158* 136* 123*  BUN 32* 25*  --  11 7 9   CREATININE 1.96* 1.40*  --  0.83 0.67 1.19*  CALCIUM 7.9* 7.6*  --  7.9* 8.5 8.8  MG  --  1.3* 2.4 2.2  --   --   PHOS  --  3.0  --   --   --   --    Liver Function Tests:  Recent Labs Lab 02/28/13 0928  AST 8  ALT 7  ALKPHOS 74  BILITOT 0.4  PROT 6.6  ALBUMIN 3.1*    Recent Labs Lab 02/28/13 0928  LIPASE 16   No results found for this basename: AMMONIA,  in the last  168 hours CBC:  Recent Labs Lab 02/28/13 1239 02/28/13 2000 03/01/13 0450 03/02/13 0520 03/04/13 0630  WBC 19.6* 12.1* 10.6* 10.9*  7.4  HGB 10.1* 9.6* 9.3* 8.8* 8.9*  HCT 29.8* 27.1* 26.7* 26.0* 25.0*  MCV 95.8 92.8 93.0 93.9 91.9  PLT 190 165 161 170 200   Cardiac Enzymes: No results found for this basename: CKTOTAL, CKMB, CKMBINDEX, TROPONINI,  in the last 168 hours BNP: No components found with this basename: POCBNP,  CBG:  Recent Labs Lab 03/04/13 0821 03/04/13 1137 03/04/13 1621 03/04/13 2019 03/05/13 0647  GLUCAP 138* 152* 111* 128* 108*    Time coordinating discharge:  Greater than 30 minutes  Signed:  Emerie Vanderkolk, DO [facility] Pager: [phone] 03/05/2013, 10:10 AM

## 2013-03-05 NOTE — Progress Notes (Signed)
Pt continues to be restless and anxious, will not stay in bed and having hallucinations. Tom callahan notified and ordered sitter at bedside. Will continue to monitor.

## 2013-03-05 NOTE — Progress Notes (Signed)
Pt is alert and oriented but will not stay in the bed, very anxious and restless; pt placed in the chair with chair alarm on. Ativan 1mg  IV given per order. Will continue to monitor.

## 2013-03-05 NOTE — Evaluation (Signed)
Physical Therapy Evaluation Patient Details Name: ERVA KOKE MRN: [id number] DOB: [birth year] Today's Date: 03/05/2013 Time: 4098-1191 PT Time Calculation (min): 16 min  PT Assessment / Plan / Recommendation History of Present Illness  51yo female with hx DM, HTN, bipolar, lives in [facility].  Brought to ER 7/21 with AMS, confusion, and R underarm abscess.  Hypotensive in ER with SBP 60's, PCCM called.   Clinical Impression  Pt being d/c today however would benefit from HHPT for further strengthening and endurance.  Pt d/c from acute PT due being d/c today.  Will have pt follow up with HHPT.     PT Assessment  Patent does not need any further PT services    Follow Up Recommendations  Home health PT (at ALF)    Does the patient have the potential to tolerate intense rehabilitation      Barriers to Discharge        Equipment Recommendations  None recommended by PT    Precautions / Restrictions Precautions Precautions: Fall   Pertinent Vitals/Pain No c/o pain      Mobility  Bed Mobility Bed Mobility: Not assessed Transfers Transfers: Sit to Stand;Stand to Sit Sit to Stand: 5: Supervision;From chair/3-in-1 Stand to Sit: 5: Supervision;To chair/3-in-1 Details for Transfer Assistance: Supervision for safety Ambulation/Gait Ambulation/Gait Assistance: 4: Min guard Ambulation Distance (Feet): 80 Feet Assistive device: Rolling walker Ambulation/Gait Assistance Details: Minguard for safety Gait Pattern: Step-through pattern;Decreased stride length;Shuffle Gait velocity: decreased Stairs: No    Exercises     PT Diagnosis:    PT Problem List:   PT Treatment Interventions:       PT Goals(Current goals can be found in the care plan section) Acute Rehab PT Goals Patient Stated Goal: To go home today.  "I would like therapy when I leave." PT Goal Formulation: No goals set, d/c therapy  Visit Information  Last PT Received On: 03/05/13 Assistance Needed:  +1 History of Present Illness: 52yo female with hx DM, HTN, bipolar, lives in Assisted living.  Brought to ER 7/21 with AMS, confusion, and R underarm abscess.  Hypotensive in ER with SBP 60's, PCCM called.        Prior Functioning  Home Living Family/patient expects to be discharged to:: Assisted living Home Equipment: Dan Humphreys - 2 wheels;Walker - 4 wheels Prior Function Level of Independence: Independent with assistive device(s) (with rollater) Communication Communication: No difficulties    Cognition  Cognition Arousal/Alertness: Awake/alert Behavior During Therapy: WFL for tasks assessed/performed Overall Cognitive Status: Within Functional Limits for tasks assessed    Extremity/Trunk Assessment Lower Extremity Assessment Lower Extremity Assessment: Generalized weakness   Balance    End of Session PT - End of Session Equipment Utilized During Treatment: Gait belt Activity Tolerance: Patient tolerated treatment well Patient left: in chair;with call bell/phone within reach;with nursing/sitter in room;with family/visitor present Nurse Communication: Mobility status  GP     Young Brim 03/05/2013, 1:25 PM  Jake Shark, PT DPT [phone]

## 2013-03-06 LAB — CULTURE, BLOOD (ROUTINE X 2): Culture: NO GROWTH

## 2013-03-06 LAB — ANAEROBIC CULTURE

## 2013-03-07 ENCOUNTER — Telehealth (INDEPENDENT_AMBULATORY_CARE_PROVIDER_SITE_OTHER): Payer: Self-pay

## 2013-03-07 NOTE — Telephone Encounter (Signed)
Cindy from [facility] called stating order for wd care was not received until late yesterday pm. Saline moist gauze packing with dry dsg was done today but labs for bmet and cbc will be drawn tomorrow as a stat. Will fax over order for Dr To sign.

## 2013-03-09 ENCOUNTER — Telehealth (INDEPENDENT_AMBULATORY_CARE_PROVIDER_SITE_OTHER): Payer: Self-pay | Admitting: General Surgery

## 2013-03-09 NOTE — Telephone Encounter (Signed)
Called back to [facility] and spoke to [place] and told her per Dr Magnus Ivan that the Doctor at her living facility will be the ones taking care of all of her meds and doing her blood work ( bmet and cbc). Dr Magnus Ivan is only doing her axilla wound.

## 2013-03-10 ENCOUNTER — Emergency Department (HOSPITAL_COMMUNITY): Payer: Medicaid Other

## 2013-03-10 ENCOUNTER — Encounter (HOSPITAL_COMMUNITY): Payer: Self-pay | Admitting: Emergency Medicine

## 2013-03-10 ENCOUNTER — Emergency Department (HOSPITAL_COMMUNITY)
Admission: EM | Admit: 2013-03-10 | Discharge: 2013-03-11 | Disposition: A | Discharge status: Home / Self Care | Payer: Medicaid Other | Attending: Emergency Medicine | Admitting: Emergency Medicine

## 2013-03-10 DIAGNOSIS — R109 Unspecified abdominal pain: Secondary | ICD-10-CM | POA: Insufficient documentation

## 2013-03-10 DIAGNOSIS — R5381 Other malaise: Secondary | ICD-10-CM | POA: Insufficient documentation

## 2013-03-10 DIAGNOSIS — I10 Essential (primary) hypertension: Secondary | ICD-10-CM | POA: Insufficient documentation

## 2013-03-10 DIAGNOSIS — Z8709 Personal history of other diseases of the respiratory system: Secondary | ICD-10-CM | POA: Insufficient documentation

## 2013-03-10 DIAGNOSIS — Z8719 Personal history of other diseases of the digestive system: Secondary | ICD-10-CM | POA: Insufficient documentation

## 2013-03-10 DIAGNOSIS — M549 Dorsalgia, unspecified: Secondary | ICD-10-CM | POA: Insufficient documentation

## 2013-03-10 DIAGNOSIS — Z3202 Encounter for pregnancy test, result negative: Secondary | ICD-10-CM | POA: Insufficient documentation

## 2013-03-10 DIAGNOSIS — R110 Nausea: Secondary | ICD-10-CM | POA: Insufficient documentation

## 2013-03-10 DIAGNOSIS — J811 Chronic pulmonary edema: Secondary | ICD-10-CM | POA: Insufficient documentation

## 2013-03-10 DIAGNOSIS — Z862 Personal history of diseases of the blood and blood-forming organs and certain disorders involving the immune mechanism: Secondary | ICD-10-CM | POA: Insufficient documentation

## 2013-03-10 DIAGNOSIS — Z79899 Other long term (current) drug therapy: Secondary | ICD-10-CM | POA: Insufficient documentation

## 2013-03-10 DIAGNOSIS — Z7982 Long term (current) use of aspirin: Secondary | ICD-10-CM | POA: Insufficient documentation

## 2013-03-10 DIAGNOSIS — F17200 Nicotine dependence, unspecified, uncomplicated: Secondary | ICD-10-CM | POA: Insufficient documentation

## 2013-03-10 DIAGNOSIS — E119 Type 2 diabetes mellitus without complications: Secondary | ICD-10-CM | POA: Insufficient documentation

## 2013-03-10 DIAGNOSIS — F319 Bipolar disorder, unspecified: Secondary | ICD-10-CM | POA: Insufficient documentation

## 2013-03-10 LAB — URINALYSIS, ROUTINE W REFLEX MICROSCOPIC
Glucose, UA: NEGATIVE mg/dL
Ketones, ur: NEGATIVE mg/dL
Leukocytes, UA: NEGATIVE
Nitrite: NEGATIVE
Specific Gravity, Urine: 1.013 (ref 1.005–1.030)
pH: 5.5 (ref 5.0–8.0)

## 2013-03-10 LAB — COMPREHENSIVE METABOLIC PANEL
AST: 10 U/L (ref 0–37)
Albumin: 2.4 g/dL — ABNORMAL LOW (ref 3.5–5.2)
BUN: 14 mg/dL (ref 6–23)
Chloride: 102 mEq/L (ref 96–112)
Creatinine, Ser: 1.46 mg/dL — ABNORMAL HIGH (ref 0.50–1.10)
Potassium: 3.8 mEq/L (ref 3.5–5.1)
Total Bilirubin: 0.2 mg/dL — ABNORMAL LOW (ref 0.3–1.2)
Total Protein: 6.5 g/dL (ref 6.0–8.3)

## 2013-03-10 LAB — CBC WITH DIFFERENTIAL/PLATELET
Basophils Absolute: 0 10*3/uL (ref 0.0–0.1)
Eosinophils Absolute: 0.3 10*3/uL (ref 0.0–0.7)
Lymphocytes Relative: 30 % (ref 12–46)
MCHC: 33.3 g/dL (ref 30.0–36.0)
Monocytes Relative: 6 % (ref 3–12)
Neutro Abs: 8.2 10*3/uL — ABNORMAL HIGH (ref 1.7–7.7)
Platelets: 367 10*3/uL (ref 150–400)
RDW: 14.7 % (ref 11.5–15.5)
WBC: 13.3 10*3/uL — ABNORMAL HIGH (ref 4.0–10.5)

## 2013-03-10 LAB — LIPASE, BLOOD: Lipase: 17 U/L (ref 11–59)

## 2013-03-10 LAB — PREGNANCY, URINE: Preg Test, Ur: NEGATIVE

## 2013-03-10 MED ORDER — IOHEXOL 300 MG/ML  SOLN
100.0000 mL | Freq: Once | INTRAMUSCULAR | Status: AC | PRN
  Administered 2013-03-10: 100 mL via INTRAVENOUS

## 2013-03-10 MED ORDER — ONDANSETRON HCL 4 MG/2ML IJ SOLN
4.0000 mg | Freq: Once | INTRAMUSCULAR | Status: AC
  Administered 2013-03-10: 4 mg via INTRAVENOUS
  Filled 2013-03-10: qty 2

## 2013-03-10 MED ORDER — HYDROMORPHONE HCL PF 1 MG/ML IJ SOLN: 0.5000 mg | Freq: Once | INTRAMUSCULAR | Status: DC

## 2013-03-10 MED ORDER — IOHEXOL 300 MG/ML  SOLN
25.0000 mL | Freq: Once | INTRAMUSCULAR | Status: AC | PRN
  Administered 2013-03-10: 25 mL via ORAL

## 2013-03-10 MED ORDER — MORPHINE SULFATE 4 MG/ML IJ SOLN
6.0000 mg | Freq: Once | INTRAMUSCULAR | Status: AC
  Administered 2013-03-10: 6 mg via INTRAVENOUS
  Filled 2013-03-10: qty 2

## 2013-03-10 MED ORDER — FUROSEMIDE 10 MG/ML IJ SOLN: 20.0000 mg | Freq: Once | INTRAMUSCULAR | Status: DC

## 2013-03-10 MED ORDER — SODIUM CHLORIDE 0.9 % IV BOLUS (SEPSIS)
1000.0000 mL | Freq: Once | INTRAVENOUS | Status: AC
  Administered 2013-03-10: 1000 mL via INTRAVENOUS

## 2013-03-10 NOTE — ED Notes (Signed)
Pt back from X-ray.  

## 2013-03-10 NOTE — ED Provider Notes (Signed)
CSN: [id number]     Arrival date & time 03/10/13  1755 History     First MD Initiated Contact with Patient 03/10/13 1806     Chief Complaint  Patient presents with  . Back Pain  . Abdominal Pain   (Consider location/radiation/quality/duration/timing/severity/associated sxs/prior Treatment) HPI Comments: 52 yo female with pancreatitis, abscess, etoh, sepsis hx presents with abd pain radiating to the back since last night, constant, sharp, similar to previous.  No injuries to back, no leg weakness, urinary changes or fevers. Pt does not have gb.  Decr po intake.  Worse with eating.  Nothing improves.   Patient is a 52 y.o. female presenting with back pain and abdominal pain. The history is provided by the patient.  Back Pain Associated symptoms: abdominal pain   Associated symptoms: no chest pain, no dysuria, no fever and no headaches   Abdominal Pain Associated symptoms include abdominal pain. Pertinent negatives include no chest pain, no headaches and no shortness of breath.    Past Medical History  Diagnosis Date  . Diabetes mellitus   . Hypertension   . Pancreatitis   . Anemia   . Bipolar 1 disorder   . Alcohol abuse   . 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 Cholecystectomy    . Appendectomy    . Tonsillectomy    . Incision and drainage abscess Right 03/01/2013    Procedure: INCISION AND DRAINAGE ABSCESS;  Surgeon: Shelly Rubenstein, MD;  Location: [facility];  Service: General;  Laterality: Right;   No family history on file. History  Substance Use Topics  . Smoking status: Current Every Day Smoker -- 0.50 packs/day  . Smokeless tobacco: Not on file  . Alcohol Use: No     Comment: hx of etoh abuse   OB History   Grav Para Term Preterm Abortions TAB SAB Ect Mult Living                 Review of Systems  Constitutional: Positive for appetite change and fatigue. Negative for fever and chills.   HENT: Negative for neck pain and neck stiffness.   Eyes: Negative for visual disturbance.  Respiratory: Negative for shortness of breath.   Cardiovascular: Negative for chest pain.  Gastrointestinal: Positive for nausea and abdominal pain. Negative for vomiting.  Genitourinary: Negative for dysuria and flank pain.  Musculoskeletal: Positive for back pain.  Skin: Negative for rash.  Neurological: Negative for light-headedness and headaches.    Allergies  Review of patient's allergies indicates no known allergies.  Home Medications   Current Outpatient Rx  Name  Route  Sig  Dispense  Refill  . acetaminophen (TYLENOL) 325 MG tablet   Oral   Take 325 mg by mouth 3 (three) times daily. Prior to fish oil         . aspirin EC 81 MG tablet   Oral   Take 81 mg by mouth daily.         . calcium carbonate (OS-CAL - DOSED IN MG OF ELEMENTAL CALCIUM) 1250 MG tablet   Oral   Take 1 tablet by mouth 2 (two) times daily.         . cholecalciferol (VITAMIN D) 1000 UNITS tablet   Oral   Take 1,000 Units by mouth daily.         Marland Kitchen docusate sodium (COLACE) 100 MG capsule   Oral   Take  100 mg by mouth 2 (two) times daily.         . DULoxetine (CYMBALTA) 60 MG capsule   Oral   Take 120 mg by mouth daily.          Marland Kitchen albuterol (PROVENTIL HFA;VENTOLIN HFA) 108 (90 BASE) MCG/ACT inhaler   Inhalation   Inhale 2 puffs into the lungs every 4 (four) hours as needed for wheezing or shortness of breath (With spacer). For shortness of breath   1 Inhaler   0   . doxycycline (VIBRA-TABS) 100 MG tablet   Oral   Take 1 tablet (100 mg total) by mouth 2 (two) times daily.   20 tablet   0   . Fluticasone-Salmeterol (ADVAIR) 250-50 MCG/DOSE AEPB   Inhalation   Inhale 1 puff into the lungs every 12 (twelve) hours.         . gabapentin (NEURONTIN) 600 MG tablet   Oral   Take 600 mg by mouth 3 (three) times daily.         Marland Kitchen guaifenesin (ROBITUSSIN) 100 MG/5ML syrup   Oral   Take  200 mg by mouth every 4 (four) hours as needed for cough.         . lamoTRIgine (LAMICTAL) 100 MG tablet   Oral   Take 100 mg by mouth 2 (two) times daily. Takes with 25 mg tab         . lamoTRIgine (LAMICTAL) 25 MG tablet   Oral   Take 25 mg by mouth 2 (two) times daily. Takes with 100mg  tablet         . LORazepam (ATIVAN) 1 MG tablet   Oral   Take 1 mg by mouth 3 (three) times daily.         . niacin (NIASPAN) 500 MG CR tablet   Oral   Take 500 mg by mouth at bedtime.         Marland Kitchen omeprazole (PRILOSEC) 20 MG capsule   Oral   Take 20 mg by mouth daily before supper.          Marland Kitchen oxyCODONE 10 MG TABS   Oral   Take 1 tablet (10 mg total) by mouth every 4 (four) hours as needed.   30 tablet   0   . QUEtiapine (SEROQUEL XR) 300 MG 24 hr tablet   Oral   Take 600 mg by mouth every evening.         . simvastatin (ZOCOR) 40 MG tablet   Oral   Take 40 mg by mouth daily.         Marland Kitchen tiotropium (SPIRIVA) 18 MCG inhalation capsule   Inhalation   Place 18 mcg into inhaler and inhale daily.         Marland Kitchen zolpidem (AMBIEN) 10 MG tablet   Oral   Take 5 mg by mouth at bedtime.           BP 120/61  Pulse 79  Temp(Src) 98.1 F (36.7 C) (Oral)  Resp 17  SpO2 95% Physical Exam  Nursing note and vitals reviewed. Constitutional: She is oriented to person, place, and time. She appears well-developed and well-nourished.  HENT:  Head: Normocephalic and atraumatic.  Mild dry mm  Eyes: Conjunctivae are normal. Right eye exhibits no discharge. Left eye exhibits no discharge.  Neck: Normal range of motion. Neck supple. No tracheal deviation present.  Cardiovascular: Normal rate and regular rhythm.   Pulmonary/Chest: Effort normal. She has rales (few bases bilateral).  Abdominal: Soft. She exhibits no distension. There is tenderness (central and epig, obese). There is no guarding.  Musculoskeletal: She exhibits no edema.  Neurological: She is alert and oriented to person,  place, and time.  Skin: Skin is warm. No rash noted.  Psychiatric: She has a normal mood and affect.    ED Course   Procedures (including critical care time)  Labs Reviewed  CBC WITH DIFFERENTIAL - Abnormal; Notable for the following:    WBC 13.3 (*)    RBC 2.71 (*)    Hemoglobin 8.7 (*)    HCT 26.1 (*)    Neutro Abs 8.2 (*)    All other components within normal limits  COMPREHENSIVE METABOLIC PANEL - Abnormal; Notable for the following:    Creatinine, Ser 1.46 (*)    Albumin 2.4 (*)    Total Bilirubin 0.2 (*)    GFR calc non Af Amer 40 (*)    GFR calc Af Amer 46 (*)    All other components within normal limits  LIPASE, BLOOD  URINALYSIS, ROUTINE W REFLEX MICROSCOPIC  PREGNANCY, URINE  PRO B NATRIURETIC PEPTIDE  TROPONIN I   No results found. No diagnosis found.  MDM  Concern for pancreatitis with hx.  If lipase unremarkable plan for CT. Fluids and pain meds given.  Pt sleeping on recheck however pain severe.  Benign exam.  CT showed mild pulm edema, lasix given and cardiac eval done. Mild crackles at bases, no distress, pt 90% without O2. Dg Chest 2 View  03/10/2013   *RADIOLOGY REPORT*  Clinical Data: Shortness of breath, back pain and abdominal pain.  CHEST - 2 VIEW  Comparison: 03/03/2013  Findings: There remains evidence of diffuse interstitial prominence and increasing pulmonary vascularity.  Part of this appearance likely is due to chronic lung disease when reviewing prior chest x- rays.  However, there may be a component of interstitial edema.  No pleural effusions are seen.  Heart size is within normal limits. Stable degenerative changes are present in the thoracic spine.  IMPRESSION: Chronic interstitial lung disease and potentially superimposed worsening interstitial edema.   Original Report Authenticated By: Irish Lack, M.D.   Ct Abdomen Pelvis W Contrast  03/10/2013   *RADIOLOGY REPORT*  Clinical Data: Abdomen and back pain.  CT ABDOMEN AND PELVIS WITH  CONTRAST  Technique:  Multidetector CT imaging of the abdomen and pelvis was performed following the standard protocol during bolus administration of intravenous contrast.  Contrast: OMNIPAQUE IOHEXOL 300 MG/ML  SOLN, 25mL OMNIPAQUE IOHEXOL 300 MG/ML  SOLN  Comparison: 07/26/2012.  Findings: Interval mild enlargement of the heart.  Mitral valve annulus calcifications are again demonstrated with an enlarged left atrium.  Cholecystectomy clips.  Diffuse subcutaneous edema.  Minimal right pleural fluid.  Interval prominence of the interstitial markings at the lung bases with a small amount of interval linear atelectasis at both lung bases.  The lateral segment of the left lobe of the liver and the caudate lobe are prominent.  Unremarkable spleen, pancreas, adrenal glands kidneys and urinary bladder.  The uterus and ovaries are unremarkable.  Small amount of fluid in the pelvic cul-de-sac.  No gastrointestinal abnormalities.  Normal appendix with swirling of the mesentery in that region.  No abnormally enlarged lymph nodes are seen at this time.  Lumbar and lower thoracic spine degenerative changes.  IMPRESSION:  1.  Interval mild cardiomegaly and mild changes of congestive heart failure with diffuse subcutaneous edema. 2.  Minimal bibasilar atelectasis. 3.  Possible  early changes of cirrhosis of the liver.   Original Report Authenticated By: Beckie Salts, M.D.   Recommended observation for diuresis and further eval of possible new onset CHF. Pt understands that she could get worse at home have severe disability or die, she has capacity to make decisions, repeats it back to me and wants to go home to fup outpt.  AMA  Enid Skeens, MD 03/11/13 [phone]

## 2013-03-10 NOTE — ED Notes (Signed)
Per EMS: pt has abd and back pain that is circumferential. Denies n/v/d. Denies any other symptoms other than back and abd pain.

## 2013-03-10 NOTE — ED Notes (Signed)
Bed:WA15<BR> Expected date:<BR> Expected time:<BR> Means of arrival:<BR> Comments:<BR> EMS

## 2013-03-11 NOTE — ED Notes (Signed)
Patient decided to leave AMA.  She was informed of the necessity to complete treatment. Patient was informed of the necessity to stay and informed that she was welcome to return at any time if her symptoms continued

## 2013-03-25 ENCOUNTER — Encounter (INDEPENDENT_AMBULATORY_CARE_PROVIDER_SITE_OTHER): Payer: Self-pay | Admitting: Surgery

## 2013-03-25 ENCOUNTER — Ambulatory Visit (INDEPENDENT_AMBULATORY_CARE_PROVIDER_SITE_OTHER): Payer: Medicaid Other | Admitting: Surgery

## 2013-03-25 ENCOUNTER — Encounter (INDEPENDENT_AMBULATORY_CARE_PROVIDER_SITE_OTHER): Payer: Medicaid Other | Admitting: Surgery

## 2013-03-25 VITALS — BP 124/72 | HR 78 | Temp 98.2°F | Resp 15 | Ht 64.5 in | Wt 220.2 lb

## 2013-03-25 DIAGNOSIS — Z09 Encounter for follow-up examination after completed treatment for conditions other than malignant neoplasm: Secondary | ICD-10-CM

## 2013-03-25 NOTE — Progress Notes (Signed)
Subjective:     Patient ID: Sarah Leonard, female   DOB: [birth year], 52 y.o.   MRN: [id number]  HPI She is here for another wound check. She is doing well and has no complaints. She is still undergoing wet-to-dry dressing changes daily in the right axilla  Review of Systems     Objective:   Physical Exam The wound is healing very well and is much smaller in size. It is only a several millimeters deep but still more than 2 cm in size. I treated the granulation tissue with silver nitrate    Assessment:     Patient stable postop     Plan:     She will continue the current wound care and I will see her back in one month

## 2013-03-30 ENCOUNTER — Telehealth (INDEPENDENT_AMBULATORY_CARE_PROVIDER_SITE_OTHER): Payer: Self-pay | Admitting: General Surgery

## 2013-03-30 NOTE — Telephone Encounter (Signed)
Arline Asp, nurse with [facility], called to alert MD of PROBABLE discharge of pt following Monday's appt.  Her wound is 0.5 x 1.0 now and is superficial.

## 2013-04-25 ENCOUNTER — Encounter (INDEPENDENT_AMBULATORY_CARE_PROVIDER_SITE_OTHER): Payer: Medicaid Other | Admitting: Surgery

## 2013-05-10 ENCOUNTER — Encounter (INDEPENDENT_AMBULATORY_CARE_PROVIDER_SITE_OTHER): Payer: Self-pay

## 2013-05-11 ENCOUNTER — Encounter (INDEPENDENT_AMBULATORY_CARE_PROVIDER_SITE_OTHER): Payer: Medicaid Other | Admitting: Surgery

## 2013-09-14 ENCOUNTER — Emergency Department (HOSPITAL_COMMUNITY)
Admission: EM | Admit: 2013-09-14 | Discharge: 2013-09-14 | Disposition: A | Discharge status: Skilled Nursing Facility | Payer: Medicaid Other | Attending: Emergency Medicine | Admitting: Emergency Medicine

## 2013-09-14 ENCOUNTER — Encounter (HOSPITAL_COMMUNITY): Payer: Self-pay | Admitting: Emergency Medicine

## 2013-09-14 DIAGNOSIS — R0609 Other forms of dyspnea: Secondary | ICD-10-CM | POA: Diagnosis not present

## 2013-09-14 DIAGNOSIS — IMO0002 Reserved for concepts with insufficient information to code with codable children: Secondary | ICD-10-CM | POA: Diagnosis not present

## 2013-09-14 DIAGNOSIS — D649 Anemia, unspecified: Secondary | ICD-10-CM | POA: Insufficient documentation

## 2013-09-14 DIAGNOSIS — F1010 Alcohol abuse, uncomplicated: Secondary | ICD-10-CM | POA: Insufficient documentation

## 2013-09-14 DIAGNOSIS — G8929 Other chronic pain: Secondary | ICD-10-CM | POA: Diagnosis not present

## 2013-09-14 DIAGNOSIS — F319 Bipolar disorder, unspecified: Secondary | ICD-10-CM | POA: Diagnosis not present

## 2013-09-14 DIAGNOSIS — F329 Major depressive disorder, single episode, unspecified: Secondary | ICD-10-CM | POA: Diagnosis not present

## 2013-09-14 DIAGNOSIS — F17200 Nicotine dependence, unspecified, uncomplicated: Secondary | ICD-10-CM | POA: Diagnosis not present

## 2013-09-14 DIAGNOSIS — Z7982 Long term (current) use of aspirin: Secondary | ICD-10-CM | POA: Diagnosis not present

## 2013-09-14 DIAGNOSIS — I959 Hypotension, unspecified: Secondary | ICD-10-CM | POA: Insufficient documentation

## 2013-09-14 DIAGNOSIS — E872 Acidosis, unspecified: Secondary | ICD-10-CM | POA: Insufficient documentation

## 2013-09-14 DIAGNOSIS — F419 Anxiety disorder, unspecified: Secondary | ICD-10-CM

## 2013-09-14 DIAGNOSIS — R0989 Other specified symptoms and signs involving the circulatory and respiratory systems: Secondary | ICD-10-CM | POA: Insufficient documentation

## 2013-09-14 DIAGNOSIS — Z79899 Other long term (current) drug therapy: Secondary | ICD-10-CM | POA: Diagnosis not present

## 2013-09-14 DIAGNOSIS — R51 Headache: Secondary | ICD-10-CM

## 2013-09-14 DIAGNOSIS — K859 Acute pancreatitis without necrosis or infection, unspecified: Secondary | ICD-10-CM | POA: Diagnosis not present

## 2013-09-14 DIAGNOSIS — F411 Generalized anxiety disorder: Secondary | ICD-10-CM | POA: Diagnosis not present

## 2013-09-14 DIAGNOSIS — R519 Headache, unspecified: Secondary | ICD-10-CM

## 2013-09-14 DIAGNOSIS — E119 Type 2 diabetes mellitus without complications: Secondary | ICD-10-CM | POA: Insufficient documentation

## 2013-09-14 DIAGNOSIS — L0390 Cellulitis, unspecified: Secondary | ICD-10-CM

## 2013-09-14 DIAGNOSIS — L0291 Cutaneous abscess, unspecified: Secondary | ICD-10-CM | POA: Insufficient documentation

## 2013-09-14 DIAGNOSIS — F1411 Cocaine abuse, in remission: Secondary | ICD-10-CM | POA: Insufficient documentation

## 2013-09-14 DIAGNOSIS — G9340 Encephalopathy, unspecified: Secondary | ICD-10-CM | POA: Diagnosis not present

## 2013-09-14 DIAGNOSIS — M549 Dorsalgia, unspecified: Secondary | ICD-10-CM | POA: Insufficient documentation

## 2013-09-14 HISTORY — DX: Cutaneous abscess, unspecified: L02.91

## 2013-09-14 HISTORY — DX: Severe sepsis with septic shock: R65.21

## 2013-09-14 HISTORY — DX: Cocaine abuse, uncomplicated: F14.10

## 2013-09-14 HISTORY — DX: Hyperkalemia: E87.5

## 2013-09-14 HISTORY — DX: Acidosis, unspecified: E87.20

## 2013-09-14 HISTORY — DX: Disorder of kidney and ureter, unspecified: N28.9

## 2013-09-14 HISTORY — DX: Encephalopathy, unspecified: G93.40

## 2013-09-14 HISTORY — DX: Other chronic pain: G89.29

## 2013-09-14 HISTORY — DX: Dorsalgia, unspecified: M54.9

## 2013-09-14 HISTORY — DX: Hypotension, unspecified: I95.9

## 2013-09-14 HISTORY — DX: Sepsis, unspecified organism: A41.9

## 2013-09-14 HISTORY — DX: Major depressive disorder, single episode, unspecified: F32.9

## 2013-09-14 HISTORY — DX: Acidosis: E87.2

## 2013-09-14 LAB — GLUCOSE, CAPILLARY: Glucose-Capillary: 162 mg/dL — ABNORMAL HIGH (ref 70–99)

## 2013-09-14 MED ORDER — HYDROCODONE-ACETAMINOPHEN 5-325 MG PO TABS
1.0000 | ORAL_TABLET | Freq: Once | ORAL | Status: AC
  Administered 2013-09-14: 1 via ORAL
  Filled 2013-09-14: qty 1

## 2013-09-14 MED ORDER — KETOROLAC TROMETHAMINE 60 MG/2ML IM SOLN
60.0000 mg | Freq: Once | INTRAMUSCULAR | Status: DC
  Filled 2013-09-14: qty 2

## 2013-09-14 MED ORDER — IBUPROFEN 400 MG PO TABS: 600.0000 mg | ORAL_TABLET | Freq: Once | ORAL | Status: DC

## 2013-09-14 MED ORDER — LORAZEPAM 2 MG/ML IJ SOLN
2.0000 mg | Freq: Once | INTRAMUSCULAR | Status: AC
  Administered 2013-09-14: 2 mg via INTRAMUSCULAR

## 2013-09-14 MED ORDER — LORAZEPAM 2 MG/ML IJ SOLN
2.0000 mg | Freq: Once | INTRAMUSCULAR | Status: DC
  Filled 2013-09-14: qty 1

## 2013-09-14 MED ORDER — DEXAMETHASONE SODIUM PHOSPHATE 10 MG/ML IJ SOLN
10.0000 mg | Freq: Once | INTRAMUSCULAR | Status: AC
  Administered 2013-09-14: 10 mg via INTRAMUSCULAR
  Filled 2013-09-14: qty 1

## 2013-09-14 MED ORDER — IBUPROFEN 200 MG PO TABS
600.0000 mg | ORAL_TABLET | Freq: Once | ORAL | Status: DC
  Administered 2013-09-14: 600 mg via ORAL
  Filled 2013-09-14 (×2): qty 1

## 2013-09-14 NOTE — ED Notes (Signed)
CBG 162 

## 2013-09-14 NOTE — ED Notes (Signed)
Per EMS - pt from [place] w/ c/o HA x1 week - pt also admits to "emotional stress" d/t stressful situation regarding her son. Pt A&Ox4, denies n/v or fever.

## 2013-09-14 NOTE — ED Notes (Addendum)
Pt is still in her personal clothes. Pt did not want to take her shirt off.

## 2013-09-14 NOTE — ED Notes (Signed)
Dr. Nanavati at bedside 

## 2013-09-14 NOTE — ED Notes (Signed)
Pt ambulatory with [facility].

## 2013-09-14 NOTE — ED Provider Notes (Signed)
CSN: [id number]     Arrival date & time 09/14/13  0220 History   First MD Initiated Contact with Patient 09/14/13 0224     Chief Complaint  Patient presents with  . Headache   (Consider location/radiation/quality/duration/timing/severity/associated sxs/prior Treatment) Patient is a 53 y.o. female presenting with headaches. The history is provided by the patient.  Headache Pain location:  Generalized Quality:  Sharp Radiates to:  Does not radiate Severity currently:  6/10 Severity at highest:  8/10 Onset quality:  Sudden Duration:  1 week Timing:  Intermittent Progression:  Waxing and waning Context: emotional stress   Associated symptoms: no abdominal pain, no dizziness, no nausea, no neck pain, no numbness, no seizures and no vomiting     Past Medical History  Diagnosis Date  . Diabetes mellitus   . Pancreatitis   . Anemia   . Bipolar 1 disorder   . Alcohol abuse   . 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 COPD (chronic obstructive pulmonary disease)   . Hypotension   . Major depressive disorder   . Cocaine abuse   . Chronic back pain   . Renal disorder   . Acidosis   . Hyperkalemia   . Abscess   . Acute encephalopathy   . Septic shock    Past Surgical History  Procedure Laterality Date  . Cholecystectomy    . Appendectomy    . Tonsillectomy    . Incision and drainage abscess Right 03/01/2013    Procedure: INCISION AND DRAINAGE ABSCESS;  Surgeon: Harl Bowie, MD;  Location: [facility];  Service: General;  Laterality: Right;  . Cesarean section     Family History  Problem Relation Age of Onset  . Diabetes Mother   . Cancer Mother     breast   History  Substance Use Topics  . Smoking status: Current Every Day Smoker -- 0.50 packs/day  . Smokeless tobacco: Never Used  . Alcohol Use: No     Comment: hx of etoh abuse   OB History   Grav Para Term Preterm Abortions TAB SAB Ect Mult Living                 Review of Systems   Constitutional: Positive for activity change.  Respiratory: Negative for shortness of breath.   Cardiovascular: Negative for chest pain.  Gastrointestinal: Negative for nausea, vomiting and abdominal pain.  Genitourinary: Negative for dysuria.  Musculoskeletal: Negative for neck pain.  Neurological: Positive for headaches. Negative for dizziness, tremors, seizures, syncope, weakness and numbness.  Psychiatric/Behavioral: Positive for sleep disturbance. Negative for suicidal ideas, hallucinations, behavioral problems, confusion and self-injury. The patient is nervous/anxious.     Allergies  Review of patient's allergies indicates no known allergies.  Home Medications   Current Outpatient Rx  Name  Route  Sig  Dispense  Refill  . aspirin EC 81 MG tablet   Oral   Take 81 mg by mouth daily.         . calcium carbonate (OS-CAL - DOSED IN MG OF ELEMENTAL CALCIUM) 1250 MG tablet   Oral   Take 1 tablet by mouth 2 (two) times daily.         . cholecalciferol (VITAMIN D) 1000 UNITS tablet   Oral   Take 1,000 Units by mouth daily.         Marland Kitchen docusate sodium (COLACE) 100 MG capsule   Oral   Take 100 mg by mouth 2 (two) times  daily.         . DULoxetine (CYMBALTA) 60 MG capsule   Oral   Take 120 mg by mouth daily.          . Fluticasone-Salmeterol (ADVAIR) 250-50 MCG/DOSE AEPB   Inhalation   Inhale 1 puff into the lungs every 12 (twelve) hours.         . gabapentin (NEURONTIN) 600 MG tablet   Oral   Take 600 mg by mouth 3 (three) times daily.         Marland Kitchen guaifenesin (ROBITUSSIN) 100 MG/5ML syrup   Oral   Take 200 mg by mouth every 4 (four) hours as needed for cough.         . lamoTRIgine (LAMICTAL) 100 MG tablet   Oral   Take 100 mg by mouth 2 (two) times daily. Takes with 25 mg tab         . lamoTRIgine (LAMICTAL) 25 MG tablet   Oral   Take 25 mg by mouth 2 (two) times daily. Takes with 100mg  tablet         . LORazepam (ATIVAN) 1 MG tablet   Oral    Take 1 mg by mouth 3 (three) times daily.         . niacin (NIASPAN) 500 MG CR tablet   Oral   Take 500 mg by mouth at bedtime.         Marland Kitchen omeprazole (PRILOSEC) 20 MG capsule   Oral   Take 20 mg by mouth daily before supper.          Marland Kitchen QUEtiapine (SEROQUEL XR) 300 MG 24 hr tablet   Oral   Take 600 mg by mouth every evening.         . simvastatin (ZOCOR) 40 MG tablet   Oral   Take 40 mg by mouth daily.         Marland Kitchen tiotropium (SPIRIVA) 18 MCG inhalation capsule   Inhalation   Place 18 mcg into inhaler and inhale daily.         Marland Kitchen zolpidem (AMBIEN) 10 MG tablet   Oral   Take 5 mg by mouth at bedtime.          Marland Kitchen acetaminophen (TYLENOL) 325 MG tablet   Oral   Take 325 mg by mouth 3 (three) times daily. Prior to fish oil         . albuterol (PROVENTIL HFA;VENTOLIN HFA) 108 (90 BASE) MCG/ACT inhaler   Inhalation   Inhale 2 puffs into the lungs every 4 (four) hours as needed for wheezing or shortness of breath (With spacer). For shortness of breath   1 Inhaler   0   . HYDROcodone-acetaminophen (NORCO/VICODIN) 5-325 MG per tablet   Oral   Take 1 tablet by mouth every 6 (six) hours as needed for pain.          BP 123/69  Pulse 97  Temp(Src) 99.4 F (37.4 C) (Oral)  Resp 20  Ht 5' 3.5" (1.613 m)  Wt 201 lb (91.173 kg)  BMI 35.04 kg/m2  SpO2 95% Physical Exam  Nursing note and vitals reviewed. Constitutional: She is oriented to person, place, and time. She appears well-developed and well-nourished.  HENT:  Head: Normocephalic and atraumatic.  Eyes: EOM are normal. Pupils are equal, round, and reactive to light.  Neck: Neck supple.  Cardiovascular: Normal rate, regular rhythm and normal heart sounds.   No murmur heard. Pulmonary/Chest: Effort normal. No respiratory distress.  Abdominal: Soft.  She exhibits no distension. There is no tenderness. There is no rebound and no guarding.  Neurological: She is alert and oriented to person, place, and time. No  cranial nerve deficit. Coordination normal.  Skin: Skin is warm and dry.  Psychiatric: Judgment and thought content normal.    ED Course  Procedures (including critical care time) Labs Review Labs Reviewed  GLUCOSE, CAPILLARY - Abnormal; Notable for the following:    Glucose-Capillary 162 (*)    All other components within normal limits   Imaging Review No results found.  EKG Interpretation   None       MDM   1. Anxiety   2. Headache    Pt comes in with cc of headaches. Intermittent headache, provoked by stress. Her son was missing for more than a week - and she was highly stressed during that time. The son is now at shelter, but she continues to swell on that issue-  As she blames herself for the fact that the son is not doing well. She also keeps thinking about "what could have happened" to the son if he was not found.  Pt has no focal neuro complains with the headaches, and no deficits on exam. H/A x 1 week and at worse is 8/10.  Pt given po and im meds - and she feels a lot better, and is visibly calmer.  She at no point had SI/HI - and reports that there is a psychiatrist that sees her at [facility].  Will d.c to the center - and recommend that she be seen by Psychiatrist over there for optimal anxiety management.  Varney Biles, MD 09/14/13 [phone]

## 2013-09-14 NOTE — Discharge Instructions (Signed)
Headaches, Frequently Asked Questions MIGRAINE HEADACHES Q: What is migraine? What causes it? How can I treat it? A: Generally, migraine headaches begin as a dull ache. Then they develop into a constant, throbbing, and pulsating pain. You may experience pain at the temples. You may experience pain at the front or back of one or both sides of the head. The pain is usually accompanied by a combination of:  Nausea.  Vomiting.  Sensitivity to light and noise. Some people (about 15%) experience an aura (see below) before an attack. The cause of migraine is believed to be chemical reactions in the brain. Treatment for migraine may include over-the-counter or prescription medications. It may also include self-help techniques. These include relaxation training and biofeedback.  Q: What is an aura? A: About 15% of people with migraine get an "aura". This is a sign of neurological symptoms that occur before a migraine headache. You may see wavy or jagged lines, dots, or flashing lights. You might experience tunnel vision or blind spots in one or both eyes. The aura can include visual or auditory hallucinations (something imagined). It may include disruptions in smell (such as strange odors), taste or touch. Other symptoms include:  Numbness.  A "pins and needles" sensation.  Difficulty in recalling or speaking the correct word. These neurological events may last as long as 60 minutes. These symptoms will fade as the headache begins. Q: What is a trigger? A: Certain physical or environmental factors can lead to or "trigger" a migraine. These include:  Foods.  Hormonal changes.  Weather.  Stress. It is important to remember that triggers are different for everyone. To help prevent migraine attacks, you need to figure out which triggers affect you. Keep a headache diary. This is a good way to track triggers. The diary will help you talk to your healthcare professional about your condition. Q: Does  weather affect migraines? A: Bright sunshine, hot, humid conditions, and drastic changes in barometric pressure may lead to, or "trigger," a migraine attack in some people. But studies have shown that weather does not act as a trigger for everyone with migraines. Q: What is the link between migraine and hormones? A: Hormones start and regulate many of your body's functions. Hormones keep your body in balance within a constantly changing environment. The levels of hormones in your body are unbalanced at times. Examples are during menstruation, pregnancy, or menopause. That can lead to a migraine attack. In fact, about three quarters of all women with migraine report that their attacks are related to the menstrual cycle.  Q: Is there an increased risk of stroke for migraine sufferers? A: The likelihood of a migraine attack causing a stroke is very remote. That is not to say that migraine sufferers cannot have a stroke associated with their migraines. In persons under age 53, the most common associated factor for stroke is migraine headache. But over the course of a person's normal life span, the occurrence of migraine headache may actually be associated with a reduced risk of dying from cerebrovascular disease due to stroke.  Q: What are acute medications for migraine? A: Acute medications are used to treat the pain of the headache after it has started. Examples over-the-counter medications, NSAIDs, ergots, and triptans.  Q: What are the triptans? A: Triptans are the newest class of abortive medications. They are specifically targeted to treat migraine. Triptans are vasoconstrictors. They moderate some chemical reactions in the brain. The triptans work on receptors in your brain. Triptans help  to restore the balance of a neurotransmitter called serotonin. Fluctuations in levels of serotonin are thought to be a main cause of migraine.  °Q: Are over-the-counter medications for migraine effective? °A:  Over-the-counter, or "OTC," medications may be effective in relieving mild to moderate pain and associated symptoms of migraine. But you should see your caregiver before beginning any treatment regimen for migraine.  °Q: What are preventive medications for migraine? °A: Preventive medications for migraine are sometimes referred to as "prophylactic" treatments. They are used to reduce the frequency, severity, and length of migraine attacks. Examples of preventive medications include antiepileptic medications, antidepressants, beta-blockers, calcium channel blockers, and NSAIDs (nonsteroidal anti-inflammatory drugs). °Q: Why are anticonvulsants used to treat migraine? °A: During the past few years, there has been an increased interest in antiepileptic drugs for the prevention of migraine. They are sometimes referred to as "anticonvulsants". Both epilepsy and migraine may be caused by similar reactions in the brain.  °Q: Why are antidepressants used to treat migraine? °A: Antidepressants are typically used to treat people with depression. They may reduce migraine frequency by regulating chemical levels, such as serotonin, in the brain.  °Q: What alternative therapies are used to treat migraine? °A: The term "alternative therapies" is often used to describe treatments considered outside the scope of conventional Western medicine. Examples of alternative therapy include acupuncture, acupressure, and yoga. Another common alternative treatment is herbal therapy. Some herbs are believed to relieve headache pain. Always discuss alternative therapies with your caregiver before proceeding. Some herbal products contain arsenic and other toxins. °TENSION HEADACHES °Q: What is a tension-type headache? What causes it? How can I treat it? °A: Tension-type headaches occur randomly. They are often the result of temporary stress, anxiety, fatigue, or anger. Symptoms include soreness in your temples, a tightening band-like sensation  around your head (a "vice-like" ache). Symptoms can also include a pulling feeling, pressure sensations, and contracting head and neck muscles. The headache begins in your forehead, temples, or the back of your head and neck. Treatment for tension-type headache may include over-the-counter or prescription medications. Treatment may also include self-help techniques such as relaxation training and biofeedback. °CLUSTER HEADACHES °Q: What is a cluster headache? What causes it? How can I treat it? °A: Cluster headache gets its name because the attacks come in groups. The pain arrives with little, if any, warning. It is usually on one side of the head. A tearing or bloodshot eye and a runny nose on the same side of the headache may also accompany the pain. Cluster headaches are believed to be caused by chemical reactions in the brain. They have been described as the most severe and intense of any headache type. Treatment for cluster headache includes prescription medication and oxygen. °SINUS HEADACHES °Q: What is a sinus headache? What causes it? How can I treat it? °A: When a cavity in the bones of the face and skull (a sinus) becomes inflamed, the inflammation will cause localized pain. This condition is usually the result of an allergic reaction, a tumor, or an infection. If your headache is caused by a sinus blockage, such as an infection, you will probably have a fever. An x-ray will confirm a sinus blockage. Your caregiver's treatment might include antibiotics for the infection, as well as antihistamines or decongestants.  °REBOUND HEADACHES °Q: What is a rebound headache? What causes it? How can I treat it? °A: A pattern of taking acute headache medications too often can lead to a condition known as "rebound headache."   A pattern of taking too much headache medication includes taking it more than 2 days per week or in excessive amounts. That means more than the label or a caregiver advises. With rebound  headaches, your medications not only stop relieving pain, they actually begin to cause headaches. Doctors treat rebound headache by tapering the medication that is being overused. Sometimes your caregiver will gradually substitute a different type of treatment or medication. Stopping may be a challenge. Regularly overusing a medication increases the potential for serious side effects. Consult a caregiver if you regularly use headache medications more than 2 days per week or more than the label advises. ADDITIONAL QUESTIONS AND ANSWERS Q: What is biofeedback? A: Biofeedback is a self-help treatment. Biofeedback uses special equipment to monitor your body's involuntary physical responses. Biofeedback monitors:  Breathing.  Pulse.  Heart rate.  Temperature.  Muscle tension.  Brain activity. Biofeedback helps you refine and perfect your relaxation exercises. You learn to control the physical responses that are related to stress. Once the technique has been mastered, you do not need the equipment any more. Q: Are headaches hereditary? A: Four out of five (80%) of people that suffer report a family history of migraine. Scientists are not sure if this is genetic or a family predisposition. Despite the uncertainty, a child has a 50% chance of having migraine if one parent suffers. The child has a 75% chance if both parents suffer.  Q: Can children get headaches? A: By the time they reach high school, most young people have experienced some type of headache. Many safe and effective approaches or medications can prevent a headache from occurring or stop it after it has begun.  Q: What type of doctor should I see to diagnose and treat my headache? A: Start with your primary caregiver. Discuss his or her experience and approach to headaches. Discuss methods of classification, diagnosis, and treatment. Your caregiver may decide to recommend you to a headache specialist, depending upon your symptoms or other  physical conditions. Having diabetes, allergies, etc., may require a more comprehensive and inclusive approach to your headache. The National Headache Foundation will provide, upon request, a list of [facility] physician members in your state. Document Released: 10/18/2003 Document Revised: 10/20/2011 Document Reviewed: 03/27/2008 [facility] Patient Information 2014 [place].  Panic Attacks Panic attacks are sudden, short-livedsurges of severe anxiety, fear, or discomfort. They may occur for no reason when you are relaxed, when you are anxious, or when you are sleeping. Panic attacks may occur for a number of reasons:   Healthy people occasionally have panic attacks in extreme, life-threatening situations, such as war or natural disasters. Normal anxiety is a protective mechanism of the body that helps [place] react to danger (fight or flight response).  Panic attacks are often seen with anxiety disorders, such as panic disorder, social anxiety disorder, generalized anxiety disorder, and phobias. Anxiety disorders cause excessive or uncontrollable anxiety. They may interfere with your relationships or other life activities.  Panic attacks are sometimes seen with other mental illnesses such as depression and posttraumatic stress disorder.  Certain medical conditions, prescription medicines, and drugs of abuse can cause panic attacks. SYMPTOMS  Panic attacks start suddenly, peak within 20 minutes, and are accompanied by four or more of the following symptoms:  Pounding heart or fast heart rate (palpitations).  Sweating.  Trembling or shaking.  Shortness of breath or feeling smothered.  Feeling choked.  Chest pain or discomfort.  Nausea or strange feeling in your stomach.  Dizziness,  lightheadedness, or feeling like you will faint.  Chills or hot flushes.  Numbness or tingling in your lips or hands and feet.  Feeling that things are not real or feeling that you are not yourself.  Fear  of losing control or going crazy.  Fear of dying. Some of these symptoms can mimic serious medical conditions. For example, you may think you are having a heart attack. Although panic attacks can be very scary, they are not life threatening. DIAGNOSIS  Panic attacks are diagnosed through an assessment by your health care provider. Your health care provider will ask questions about your symptoms, such as where and when they occurred. Your health care provider will also ask about your medical history and use of alcohol and drugs, including prescription medicines. Your health care provider may order blood tests or other studies to rule out a serious medical condition. Your health care provider may refer you to a mental health professional for further evaluation. TREATMENT   Most healthy people who have one or two panic attacks in an extreme, life-threatening situation will not require treatment.  The treatment for panic attacks associated with anxiety disorders or other mental illness typically involves counseling with a mental health professional, medicine, or a combination of both. Your health care provider will help determine what treatment is best for you.  Panic attacks due to physical illness usually goes away with treatment of the illness. If prescription medicine is causing panic attacks, talk with your health care provider about stopping the medicine, decreasing the dose, or substituting another medicine.  Panic attacks due to alcohol or drug abuse goes away with abstinence. Some adults need professional help in order to stop drinking or using drugs. HOME CARE INSTRUCTIONS   Take all your medicines as prescribed.   Check with your health care provider before starting new prescription or over-the-counter medicines.  Keep all follow up appointments with your health care provider. SEEK MEDICAL CARE IF:  You are not able to take your medicines as prescribed.  Your symptoms do not improve  or get worse. SEEK IMMEDIATE MEDICAL CARE IF:   You experience panic attack symptoms that are different than your usual symptoms.  You have serious thoughts about hurting yourself or others.  You are taking medicine for panic attacks and have a serious side effect. MAKE SURE YOU:  Understand these instructions.  Will watch your condition.  Will get help right away if you are not doing well or get worse. Document Released: 07/28/2005 Document Revised: 05/18/2013 Document Reviewed: 03/11/2013 [facility] Patient Information 2014 [place].

## 2014-11-20 ENCOUNTER — Encounter (HOSPITAL_COMMUNITY): Payer: Self-pay | Admitting: Emergency Medicine

## 2014-11-20 ENCOUNTER — Emergency Department (HOSPITAL_COMMUNITY): Payer: Medicaid Other

## 2014-11-20 ENCOUNTER — Emergency Department (HOSPITAL_COMMUNITY)
Admission: EM | Admit: 2014-11-20 | Discharge: 2014-11-20 | Disposition: A | Discharge status: Home / Self Care | Payer: Medicaid Other | Attending: Emergency Medicine | Admitting: Emergency Medicine

## 2014-11-20 DIAGNOSIS — Z862 Personal history of diseases of the blood and blood-forming organs and certain disorders involving the immune mechanism: Secondary | ICD-10-CM | POA: Diagnosis not present

## 2014-11-20 DIAGNOSIS — R079 Chest pain, unspecified: Secondary | ICD-10-CM | POA: Diagnosis not present

## 2014-11-20 DIAGNOSIS — E119 Type 2 diabetes mellitus without complications: Secondary | ICD-10-CM | POA: Diagnosis not present

## 2014-11-20 DIAGNOSIS — Z7951 Long term (current) use of inhaled steroids: Secondary | ICD-10-CM | POA: Insufficient documentation

## 2014-11-20 DIAGNOSIS — F319 Bipolar disorder, unspecified: Secondary | ICD-10-CM | POA: Insufficient documentation

## 2014-11-20 DIAGNOSIS — Z87448 Personal history of other diseases of urinary system: Secondary | ICD-10-CM | POA: Insufficient documentation

## 2014-11-20 DIAGNOSIS — Z8719 Personal history of other diseases of the digestive system: Secondary | ICD-10-CM | POA: Insufficient documentation

## 2014-11-20 DIAGNOSIS — Z8669 Personal history of other diseases of the nervous system and sense organs: Secondary | ICD-10-CM | POA: Insufficient documentation

## 2014-11-20 DIAGNOSIS — Z8619 Personal history of other infectious and parasitic diseases: Secondary | ICD-10-CM | POA: Diagnosis not present

## 2014-11-20 DIAGNOSIS — Z8679 Personal history of other diseases of the circulatory system: Secondary | ICD-10-CM | POA: Diagnosis not present

## 2014-11-20 DIAGNOSIS — J441 Chronic obstructive pulmonary disease with (acute) exacerbation: Secondary | ICD-10-CM | POA: Diagnosis not present

## 2014-11-20 DIAGNOSIS — R05 Cough: Secondary | ICD-10-CM | POA: Diagnosis present

## 2014-11-20 DIAGNOSIS — Z79899 Other long term (current) drug therapy: Secondary | ICD-10-CM | POA: Insufficient documentation

## 2014-11-20 DIAGNOSIS — Z720 Tobacco use: Secondary | ICD-10-CM | POA: Diagnosis not present

## 2014-11-20 DIAGNOSIS — G8929 Other chronic pain: Secondary | ICD-10-CM | POA: Diagnosis not present

## 2014-11-20 DIAGNOSIS — Z7982 Long term (current) use of aspirin: Secondary | ICD-10-CM | POA: Diagnosis not present

## 2014-11-20 LAB — CBC WITH DIFFERENTIAL/PLATELET
Basophils Absolute: 0 10*3/uL (ref 0.0–0.1)
Basophils Relative: 0 % (ref 0–1)
EOS PCT: 1 % (ref 0–5)
Eosinophils Absolute: 0.1 10*3/uL (ref 0.0–0.7)
HEMATOCRIT: 41.3 % (ref 36.0–46.0)
HEMOGLOBIN: 13.9 g/dL (ref 12.0–15.0)
LYMPHS PCT: 21 % (ref 12–46)
Lymphs Abs: 1.8 10*3/uL (ref 0.7–4.0)
MCH: 32.1 pg (ref 26.0–34.0)
MCHC: 33.7 g/dL (ref 30.0–36.0)
MCV: 95.4 fL (ref 78.0–100.0)
MONO ABS: 0.4 10*3/uL (ref 0.1–1.0)
Monocytes Relative: 5 % (ref 3–12)
NEUTROS ABS: 6.4 10*3/uL (ref 1.7–7.7)
Neutrophils Relative %: 73 % (ref 43–77)
Platelets: 208 10*3/uL (ref 150–400)
RBC: 4.33 MIL/uL (ref 3.87–5.11)
RDW: 13 % (ref 11.5–15.5)
WBC: 8.7 10*3/uL (ref 4.0–10.5)

## 2014-11-20 LAB — BASIC METABOLIC PANEL
Anion gap: 12 (ref 5–15)
BUN: 6 mg/dL (ref 6–23)
CHLORIDE: 96 mmol/L (ref 96–112)
CO2: 25 mmol/L (ref 19–32)
CREATININE: 0.59 mg/dL (ref 0.50–1.10)
Calcium: 9.5 mg/dL (ref 8.4–10.5)
GFR calc non Af Amer: >90 mL/min (ref >90)
Glucose, Bld: 342 mg/dL — ABNORMAL HIGH (ref 70–99)
POTASSIUM: 4.2 mmol/L (ref 3.5–5.1)
Sodium: 133 mmol/L — ABNORMAL LOW (ref 135–145)

## 2014-11-20 MED ORDER — IPRATROPIUM BROMIDE 0.02 % IN SOLN
0.5000 mg | Freq: Once | RESPIRATORY_TRACT | Status: AC
  Administered 2014-11-20: 0.5 mg via RESPIRATORY_TRACT
  Filled 2014-11-20: qty 2.5

## 2014-11-20 MED ORDER — PREDNISONE 20 MG PO TABS
60.0000 mg | ORAL_TABLET | Freq: Once | ORAL | Status: AC
  Administered 2014-11-20: 60 mg via ORAL
  Filled 2014-11-20: qty 3

## 2014-11-20 MED ORDER — PREDNISONE 10 MG PO TABS: 60.0000 mg | ORAL_TABLET | Freq: Every day | ORAL | Status: DC

## 2014-11-20 MED ORDER — ACETAMINOPHEN 325 MG PO TABS
975.0000 mg | ORAL_TABLET | Freq: Once | ORAL | Status: AC
  Administered 2014-11-20: 975 mg via ORAL
  Filled 2014-11-20: qty 3

## 2014-11-20 MED ORDER — ALBUTEROL SULFATE (2.5 MG/3ML) 0.083% IN NEBU
5.0000 mg | INHALATION_SOLUTION | Freq: Once | RESPIRATORY_TRACT | Status: AC
  Administered 2014-11-20: 5 mg via RESPIRATORY_TRACT
  Filled 2014-11-20: qty 6

## 2014-11-20 MED ORDER — AZITHROMYCIN 250 MG PO TABS: 250.0000 mg | ORAL_TABLET | Freq: Every day | ORAL | Status: DC

## 2014-11-20 NOTE — ED Notes (Signed)
Report from [place], rn.  Pt care assumed.

## 2014-11-20 NOTE — Discharge Instructions (Signed)

## 2014-11-20 NOTE — ED Notes (Signed)
Pt given bus pass to facilitate transportation home

## 2014-11-20 NOTE — ED Provider Notes (Signed)
CSN: [id number]     Arrival date & time 11/20/14  1022 History   First MD Initiated Contact with Patient 11/20/14 1024     Chief Complaint  Patient presents with  . Cough  . Chest Pain      HPI Patient has a history of COPD and presents emergency department complaining of ongoing cough and congestion worsening over the past 4-5 days.  Chills without documented fevers.  Patient continues to abuse tobacco at this time.  No unilateral leg swelling.  No history DVT or pulmonary embolism.  No active chest pain this time.  She does report pain in her chest only when she coughs.   Past Medical History  Diagnosis Date  . Diabetes mellitus   . Pancreatitis   . Anemia   . Bipolar 1 disorder   . Alcohol abuse   . 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 COPD (chronic obstructive pulmonary disease)   . Hypotension   . Major depressive disorder   . Cocaine abuse   . Chronic back pain   . Renal disorder   . Acidosis   . Hyperkalemia   . Abscess   . Acute encephalopathy   . Septic shock    Past Surgical History  Procedure Laterality Date  . Cholecystectomy    . Appendectomy    . Tonsillectomy    . Incision and drainage abscess Right 03/01/2013    Procedure: INCISION AND DRAINAGE ABSCESS;  Surgeon: Harl Bowie, MD;  Location: [facility];  Service: General;  Laterality: Right;  . Cesarean section     Family History  Problem Relation Age of Onset  . Diabetes Mother   . Cancer Mother     breast   History  Substance Use Topics  . Smoking status: Current Every Day Smoker -- 0.50 packs/day  . Smokeless tobacco: Never Used  . Alcohol Use: No     Comment: hx of etoh abuse   OB History    No data available     Review of Systems  All other systems reviewed and are negative.     Allergies  Review of patient's allergies indicates no known allergies.  Home Medications   Prior to Admission medications   Medication Sig Start Date End Date Taking? Authorizing  Provider  acetaminophen (TYLENOL) 325 MG tablet Take 325 mg by mouth 3 (three) times daily. Prior to fish oil    Historical Provider, MD  albuterol (PROVENTIL HFA;VENTOLIN HFA) 108 (90 BASE) MCG/ACT inhaler Inhale 2 puffs into the lungs every 4 (four) hours as needed for wheezing or shortness of breath (With spacer). For shortness of breath 08/21/12   Charlotte Sanes, MD  aspirin EC 81 MG tablet Take 81 mg by mouth daily.    Historical Provider, MD  calcium carbonate (OS-CAL - DOSED IN MG OF ELEMENTAL CALCIUM) 1250 MG tablet Take 1 tablet by mouth 2 (two) times daily.    Historical Provider, MD  cholecalciferol (VITAMIN D) 1000 UNITS tablet Take 1,000 Units by mouth daily.    Historical Provider, MD  docusate sodium (COLACE) 100 MG capsule Take 100 mg by mouth 2 (two) times daily.    Historical Provider, MD  DULoxetine (CYMBALTA) 60 MG capsule Take 120 mg by mouth daily.     Historical Provider, MD  Fluticasone-Salmeterol (ADVAIR) 250-50 MCG/DOSE AEPB Inhale 1 puff into the lungs every 12 (twelve) hours.    Historical Provider, MD  gabapentin (NEURONTIN) 600 MG tablet  Take 600 mg by mouth 3 (three) times daily.    Historical Provider, MD  guaifenesin (ROBITUSSIN) 100 MG/5ML syrup Take 200 mg by mouth every 4 (four) hours as needed for cough.    Historical Provider, MD  HYDROcodone-acetaminophen (NORCO/VICODIN) 5-325 MG per tablet Take 1 tablet by mouth every 6 (six) hours as needed for pain.    Historical Provider, MD  lamoTRIgine (LAMICTAL) 100 MG tablet Take 100 mg by mouth 2 (two) times daily. Takes with 25 mg tab    Historical Provider, MD  lamoTRIgine (LAMICTAL) 25 MG tablet Take 25 mg by mouth 2 (two) times daily. Takes with 100mg  tablet    Historical Provider, MD  LORazepam (ATIVAN) 1 MG tablet Take 1 mg by mouth 3 (three) times daily.    Historical Provider, MD  niacin (NIASPAN) 500 MG CR tablet Take 500 mg by mouth at bedtime.    Historical Provider, MD  omeprazole (PRILOSEC) 20 MG capsule  Take 20 mg by mouth daily before supper.     Historical Provider, MD  QUEtiapine (SEROQUEL XR) 300 MG 24 hr tablet Take 600 mg by mouth every evening.    Historical Provider, MD  simvastatin (ZOCOR) 40 MG tablet Take 40 mg by mouth daily.    Historical Provider, MD  tiotropium (SPIRIVA) 18 MCG inhalation capsule Place 18 mcg into inhaler and inhale daily.    Historical Provider, MD  zolpidem (AMBIEN) 10 MG tablet Take 5 mg by mouth at bedtime.     Historical Provider, MD   BP 164/147 mmHg  Pulse 110  Temp(Src) 99.3 F (37.4 C) (Oral)  Resp 20  SpO2 92% Physical Exam  Constitutional: She is oriented to person, place, and time. She appears well-developed and well-nourished. No distress.  HENT:  Head: Normocephalic and atraumatic.  Eyes: EOM are normal.  Neck: Normal range of motion.  Cardiovascular: Normal rate, regular rhythm and normal heart sounds.   Pulmonary/Chest: Effort normal. She has wheezes.  Rhonchi bilaterally  Abdominal: Soft. She exhibits no distension. There is no tenderness.  Musculoskeletal: Normal range of motion.  Neurological: She is alert and oriented to person, place, and time.  Skin: Skin is warm and dry.  Psychiatric: She has a normal mood and affect. Judgment normal.  Nursing note and vitals reviewed.   ED Course  Procedures (including critical care time) Labs Review Labs Reviewed  BASIC METABOLIC PANEL - Abnormal; Notable for the following:    Sodium 133 (*)    Glucose, Bld 342 (*)    All other components within normal limits  CBC WITH DIFFERENTIAL/PLATELET    Imaging Review Dg Chest 2 View  11/20/2014   CLINICAL DATA:  Cough, chest pain.  EXAM: CHEST  2 VIEW  COMPARISON:  March 10, 2013.  FINDINGS: The heart size and mediastinal contours are within normal limits. No pneumothorax or pleural effusion is noted. Stable diffuse interstitial densities are noted throughout both lungs most consistent with chronic scarring or interstitial lung disease.  Superimposed acute pulmonary edema cannot be excluded. Anterior osteophyte formation is noted in lower thoracic spine.  IMPRESSION: Stable diffuse interstitial densities are noted throughout both lungs consistent with chronic scarring or interstitial lung disease. Superimposed acute pulmonary edema cannot be excluded.   Electronically Signed   By: Marijo Conception, M.D.   On: 11/20/2014 13:00  I personally reviewed the imaging tests through PACS system I reviewed available ER/hospitalization records through the EMR    EKG Interpretation   Date/Time:  Monday November 20 2014 10:29:45 EDT Ventricular Rate:  111 PR Interval:  149 QRS Duration: 93 QT Interval:  327 QTC Calculation: 444 R Axis:   100 Text Interpretation:  Sinus tachycardia Right axis deviation Low voltage,  precordial leads No significant change was found Confirmed by Claribel Sachs  MD,  Shonice Wrisley ([id number]) on 11/20/2014 10:41:10 AM      MDM   Final diagnoses:  COPD exacerbation    Likely bronchitis with superimposed COPD exacerbation.  Labs, chest x-ray pending.  Doubt ACS.  Low suspicion for PE.  2:44 PM Patient is feeling better at this time.  She would like to go home.  Home with steroids.  She has albuterol at home.  Azithromycin given for atypical coverage.  Patient understands to return to the ER for new or worsening symptoms.    Jola Schmidt, MD 11/20/14 [phone]

## 2014-11-20 NOTE — ED Notes (Addendum)
Pt from [facility]. Has congestion and cough for 1 week; COPD; smokes pack day. Was smoking when [facility] arrived. Unsure if been running fevers. 88% RA when smoking. 4L [place] got her to 94%.

## 2014-11-24 ENCOUNTER — Emergency Department (HOSPITAL_COMMUNITY): Payer: Medicaid Other

## 2014-11-24 ENCOUNTER — Other Ambulatory Visit (HOSPITAL_COMMUNITY): Payer: Self-pay

## 2014-11-24 ENCOUNTER — Inpatient Hospital Stay (HOSPITAL_COMMUNITY)
Admission: EM | Admit: 2014-11-24 | Discharge: 2014-11-29 | DRG: 193 | Disposition: A | Discharge status: Home / Self Care | Payer: Medicaid Other | Attending: Internal Medicine | Admitting: Internal Medicine

## 2014-11-24 ENCOUNTER — Encounter (HOSPITAL_COMMUNITY): Payer: Self-pay

## 2014-11-24 DIAGNOSIS — Z9114 Patient's other noncompliance with medication regimen: Secondary | ICD-10-CM | POA: Diagnosis present

## 2014-11-24 DIAGNOSIS — Z7982 Long term (current) use of aspirin: Secondary | ICD-10-CM

## 2014-11-24 DIAGNOSIS — Z6841 Body Mass Index (BMI) 40.0 and over, adult: Secondary | ICD-10-CM

## 2014-11-24 DIAGNOSIS — F319 Bipolar disorder, unspecified: Secondary | ICD-10-CM | POA: Diagnosis present

## 2014-11-24 DIAGNOSIS — Z9049 Acquired absence of other specified parts of digestive tract: Secondary | ICD-10-CM | POA: Diagnosis present

## 2014-11-24 DIAGNOSIS — R109 Unspecified abdominal pain: Secondary | ICD-10-CM

## 2014-11-24 DIAGNOSIS — Z9119 Patient's noncompliance with other medical treatment and regimen: Secondary | ICD-10-CM | POA: Diagnosis present

## 2014-11-24 DIAGNOSIS — J189 Pneumonia, unspecified organism: Principal | ICD-10-CM | POA: Diagnosis present

## 2014-11-24 DIAGNOSIS — G9340 Encephalopathy, unspecified: Secondary | ICD-10-CM | POA: Diagnosis present

## 2014-11-24 DIAGNOSIS — J9601 Acute respiratory failure with hypoxia: Secondary | ICD-10-CM | POA: Diagnosis present

## 2014-11-24 DIAGNOSIS — D649 Anemia, unspecified: Secondary | ICD-10-CM | POA: Diagnosis present

## 2014-11-24 DIAGNOSIS — E872 Acidosis, unspecified: Secondary | ICD-10-CM | POA: Diagnosis present

## 2014-11-24 DIAGNOSIS — M549 Dorsalgia, unspecified: Secondary | ICD-10-CM | POA: Diagnosis present

## 2014-11-24 DIAGNOSIS — Z720 Tobacco use: Secondary | ICD-10-CM | POA: Diagnosis present

## 2014-11-24 DIAGNOSIS — R51 Headache: Secondary | ICD-10-CM

## 2014-11-24 DIAGNOSIS — J441 Chronic obstructive pulmonary disease with (acute) exacerbation: Secondary | ICD-10-CM

## 2014-11-24 DIAGNOSIS — F1010 Alcohol abuse, uncomplicated: Secondary | ICD-10-CM | POA: Diagnosis present

## 2014-11-24 DIAGNOSIS — G8929 Other chronic pain: Secondary | ICD-10-CM | POA: Diagnosis present

## 2014-11-24 DIAGNOSIS — F17210 Nicotine dependence, cigarettes, uncomplicated: Secondary | ICD-10-CM | POA: Diagnosis present

## 2014-11-24 DIAGNOSIS — E875 Hyperkalemia: Secondary | ICD-10-CM | POA: Diagnosis present

## 2014-11-24 DIAGNOSIS — R519 Headache, unspecified: Secondary | ICD-10-CM

## 2014-11-24 DIAGNOSIS — IMO0002 Reserved for concepts with insufficient information to code with codable children: Secondary | ICD-10-CM | POA: Diagnosis present

## 2014-11-24 DIAGNOSIS — F1410 Cocaine abuse, uncomplicated: Secondary | ICD-10-CM | POA: Diagnosis present

## 2014-11-24 DIAGNOSIS — I10 Essential (primary) hypertension: Secondary | ICD-10-CM | POA: Diagnosis present

## 2014-11-24 DIAGNOSIS — R0602 Shortness of breath: Secondary | ICD-10-CM

## 2014-11-24 DIAGNOSIS — J449 Chronic obstructive pulmonary disease, unspecified: Secondary | ICD-10-CM | POA: Diagnosis present

## 2014-11-24 DIAGNOSIS — Z91148 Patient's other noncompliance with medication regimen for other reason: Secondary | ICD-10-CM

## 2014-11-24 DIAGNOSIS — E1165 Type 2 diabetes mellitus with hyperglycemia: Secondary | ICD-10-CM | POA: Diagnosis present

## 2014-11-24 DIAGNOSIS — E669 Obesity, unspecified: Secondary | ICD-10-CM | POA: Diagnosis present

## 2014-11-24 MED ORDER — IPRATROPIUM-ALBUTEROL 0.5-2.5 (3) MG/3ML IN SOLN
3.0000 mL | Freq: Once | RESPIRATORY_TRACT | Status: AC
  Administered 2014-11-24: 3 mL via RESPIRATORY_TRACT
  Filled 2014-11-24: qty 3

## 2014-11-24 NOTE — ED Notes (Signed)
Per EMS - pt was seen Monday/Tuesday in ED, dx with cold-like symptoms and bronchitis. Pt has been taking abx and prednisone - c/o symptoms not improving. Pt smoked a cigarette and then symptoms did not improve, called EMS. Pt c/o headache from coughing. No wheezing, felt feverish - gave 1000mg  Tylenol. CBG 480, BP 166/80, HR 100+.

## 2014-11-24 NOTE — ED Provider Notes (Signed)
CSN: [id number]     Arrival date & time 11/24/14  2235 History   First MD Initiated Contact with Patient 11/24/14 2235     Chief Complaint  Patient presents with  . Bronchitis     The history is provided by the patient. No language interpreter was used.   Sarah Leonard presents for evaluation of cough and shortness of breath. She states that she's had a cough is productive of white sticky phlegm for the last week. She has associated shortness of breath. She has chest pain with coughing and headache with coughing. Symptoms are severe, constant, worsening. She states that she is not on oxygen at home. She lives at [facility] assisted living. She was seen in the emergency department a few days ago for similar symptoms and does not feel like she is improving. She did smoke a cigarettes a days to see how it would make her feel and she states she felt worse. Per nursing report she was noncompliant with her insulin today, patient states she takes whatever they gave her.  Past Medical History  Diagnosis Date  . Diabetes mellitus   . Pancreatitis   . Anemia   . Bipolar 1 disorder   . Alcohol abuse   . 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 COPD (chronic obstructive pulmonary disease)   . Hypotension   . Major depressive disorder   . Cocaine abuse   . Chronic back pain   . Renal disorder   . Acidosis   . Hyperkalemia   . Abscess   . Acute encephalopathy   . Septic shock    Past Surgical History  Procedure Laterality Date  . Cholecystectomy    . Appendectomy    . Tonsillectomy    . Incision and drainage abscess Right 03/01/2013    Procedure: INCISION AND DRAINAGE ABSCESS;  Surgeon: Harl Bowie, MD;  Location: [place];  Service: General;  Laterality: Right;  . Cesarean section     Family History  Problem Relation Age of Onset  . Diabetes Mother   . Cancer Mother     breast   History  Substance Use Topics  . Smoking status: Current Every Day Smoker -- 0.50  packs/day  . Smokeless tobacco: Never Used  . Alcohol Use: No     Comment: hx of etoh abuse   OB History    No data available     Review of Systems  All other systems reviewed and are negative.     Allergies  Review of patient's allergies indicates no known allergies.  Home Medications   Prior to Admission medications   Medication Sig Start Date End Date Taking? Authorizing Provider  acetaminophen (TYLENOL) 325 MG tablet Take 325 mg by mouth 3 (three) times daily. Prior to fish oil    Historical Provider, MD  albuterol (PROVENTIL HFA;VENTOLIN HFA) 108 (90 BASE) MCG/ACT inhaler Inhale 2 puffs into the lungs every 4 (four) hours as needed for wheezing or shortness of breath (With spacer). For shortness of breath 08/21/12   Charlotte Sanes, MD  aspirin EC 81 MG tablet Take 81 mg by mouth daily.    Historical Provider, MD  azithromycin (ZITHROMAX Z-PAK) 250 MG tablet Take 1 tablet (250 mg total) by mouth daily. Take 2 tabs for first dose, then 1 tab for each additional dose 11/20/14   Jola Schmidt, MD  calcium carbonate (OS-CAL - DOSED IN MG OF ELEMENTAL CALCIUM) 1250 MG tablet Take  1 tablet by mouth 2 (two) times daily.    Historical Provider, MD  cholecalciferol (VITAMIN D) 1000 UNITS tablet Take 1,000 Units by mouth daily.    Historical Provider, MD  docusate sodium (COLACE) 100 MG capsule Take 100 mg by mouth 2 (two) times daily.    Historical Provider, MD  DULoxetine (CYMBALTA) 60 MG capsule Take 120 mg by mouth daily.     Historical Provider, MD  Fluticasone-Salmeterol (ADVAIR) 250-50 MCG/DOSE AEPB Inhale 1 puff into the lungs every 12 (twelve) hours.    Historical Provider, MD  gabapentin (NEURONTIN) 600 MG tablet Take 600 mg by mouth 3 (three) times daily.    Historical Provider, MD  guaifenesin (ROBITUSSIN) 100 MG/5ML syrup Take 200 mg by mouth every 4 (four) hours as needed for cough.    Historical Provider, MD  HYDROcodone-acetaminophen (NORCO/VICODIN) 5-325 MG per tablet Take 1  tablet by mouth every 6 (six) hours as needed for pain.    Historical Provider, MD  lamoTRIgine (LAMICTAL) 100 MG tablet Take 100 mg by mouth 2 (two) times daily. Takes with 25 mg tab    Historical Provider, MD  lamoTRIgine (LAMICTAL) 25 MG tablet Take 25 mg by mouth 2 (two) times daily. Takes with 100mg  tablet    Historical Provider, MD  lisinopril (PRINIVIL,ZESTRIL) 5 MG tablet Take 5 mg by mouth daily.    Historical Provider, MD  LORazepam (ATIVAN) 1 MG tablet Take 1 mg by mouth 2 (two) times daily.     Historical Provider, MD  metFORMIN (GLUCOPHAGE) 500 MG tablet Take 500 mg by mouth 2 (two) times daily with a meal.    Historical Provider, MD  niacin (NIASPAN) 500 MG CR tablet Take 750 mg by mouth at bedtime.     Historical Provider, MD  omeprazole (PRILOSEC) 20 MG capsule Take 20 mg by mouth daily before supper.     Historical Provider, MD  predniSONE (DELTASONE) 10 MG tablet Take 6 tablets (60 mg total) by mouth daily. 11/20/14   Jola Schmidt, MD  promethazine (PHENERGAN) 12.5 MG tablet Take 12.5 mg by mouth every 6 (six) hours as needed for nausea or vomiting.    Historical Provider, MD  QUEtiapine (SEROQUEL XR) 300 MG 24 hr tablet Take 600 mg by mouth every evening.    Historical Provider, MD  QUEtiapine (SEROQUEL XR) 50 MG TB24 24 hr tablet Take 100 mg by mouth daily. Take with 600mg  to equal 700mg  daily dose    Historical Provider, MD  simvastatin (ZOCOR) 40 MG tablet Take 40 mg by mouth daily.    Historical Provider, MD  tiotropium (SPIRIVA) 18 MCG inhalation capsule Place 18 mcg into inhaler and inhale daily.    Historical Provider, MD  zolpidem (AMBIEN) 10 MG tablet Take 5 mg by mouth at bedtime.     Historical Provider, MD   BP 128/65 mmHg  Pulse 116  Resp 23  SpO2 91% Physical Exam  Constitutional: She is oriented to person, place, and time. She appears well-developed and well-nourished. She appears distressed.  HENT:  Head: Normocephalic and atraumatic.  Cardiovascular: Regular  rhythm.   No murmur heard. Tachycardic, no murmur  Pulmonary/Chest: Breath sounds normal. She is in respiratory distress.  Diffuse wheezes and rhonchi bilaterally  Abdominal: Soft. There is no tenderness. There is no rebound and no guarding.  Musculoskeletal: She exhibits no edema or tenderness.  Neurological: She is alert and oriented to person, place, and time.  Skin: Skin is warm and dry.  Psychiatric:  Anxious  Nursing note and vitals reviewed.   ED Course  Procedures (including critical care time) Labs Review Labs Reviewed  COMPREHENSIVE METABOLIC PANEL - Abnormal; Notable for the following:    Sodium 133 (*)    Chloride 93 (*)    Glucose, Bld 513 (*)    Albumin 3.2 (*)    Alkaline Phosphatase 159 (*)    All other components within normal limits  CBC WITH DIFFERENTIAL/PLATELET - Abnormal; Notable for the following:    WBC 14.6 (*)    Neutro Abs 9.9 (*)    All other components within normal limits  I-STAT CG4 LACTIC ACID, ED - Abnormal; Notable for the following:    Lactic Acid, Venous 3.64 (*)    All other components within normal limits  I-STAT VENOUS BLOOD GAS, ED - Abnormal; Notable for the following:    pH, Ven 7.370 (*)    pCO2, Ven 53.1 (*)    pO2, Ven 141.0 (*)    Bicarbonate 30.7 (*)    Acid-Base Excess 4.0 (*)    All other components within normal limits  CULTURE, BLOOD (ROUTINE X 2)  CULTURE, BLOOD (ROUTINE X 2)  BLOOD GAS, VENOUS  I-STAT TROPOININ, ED    Imaging Review Dg Chest Port 1 View  11/24/2014   CLINICAL DATA:  Bronchitis.  EXAM: PORTABLE CHEST - 1 VIEW  COMPARISON:  11/20/2014  FINDINGS: There is chronic marked interstitial coarsening, especially in the lower lungs. There is no definitive change when accounting for differences in technique. The lungs are chronically hyperinflated. No edema, effusion, or pneumothorax. Normal heart size and aortic contours.  IMPRESSION: Chronic lung disease. There is no definitive change from 11/20/2014, but note  that chronic opacity could obscure pneumonia at the bases.   Electronically Signed   By: Monte Fantasia M.D.   On: 11/24/2014 23:31     EKG Interpretation None      MDM   Final diagnoses:  COPD with acute exacerbation  Hyperglycemia due to type 2 diabetes mellitus    Patient with COPD here with cough and progressive shortness of breath 30 on outpatient prednisone and azithromycin. Patient does have hypoxemia with considerable wheezing on exam. Treatment for possible HCAP given history and examination, patient lives at [facility].  Patient blood sugar is significantly elevated, likely secondary to recent prednisone use as well as medication noncompliance. Provider and IV fluids as well as insulin. Plan to admit to hospital service for further management.    Quintella Reichert, MD 11/25/14 [phone]

## 2014-11-24 NOTE — ED Notes (Signed)
Pt reports noncompliance w/ insulin throughout the day. CBG 495.

## 2014-11-25 ENCOUNTER — Encounter (HOSPITAL_COMMUNITY): Payer: Self-pay | Admitting: Internal Medicine

## 2014-11-25 ENCOUNTER — Inpatient Hospital Stay (HOSPITAL_COMMUNITY): Payer: Medicaid Other

## 2014-11-25 DIAGNOSIS — Z6841 Body Mass Index (BMI) 40.0 and over, adult: Secondary | ICD-10-CM | POA: Diagnosis not present

## 2014-11-25 DIAGNOSIS — E872 Acidosis, unspecified: Secondary | ICD-10-CM | POA: Diagnosis present

## 2014-11-25 DIAGNOSIS — J189 Pneumonia, unspecified organism: Secondary | ICD-10-CM | POA: Diagnosis not present

## 2014-11-25 DIAGNOSIS — J441 Chronic obstructive pulmonary disease with (acute) exacerbation: Secondary | ICD-10-CM

## 2014-11-25 DIAGNOSIS — F17210 Nicotine dependence, cigarettes, uncomplicated: Secondary | ICD-10-CM | POA: Diagnosis present

## 2014-11-25 DIAGNOSIS — Z7982 Long term (current) use of aspirin: Secondary | ICD-10-CM | POA: Diagnosis not present

## 2014-11-25 DIAGNOSIS — F1010 Alcohol abuse, uncomplicated: Secondary | ICD-10-CM | POA: Diagnosis present

## 2014-11-25 DIAGNOSIS — R0602 Shortness of breath: Secondary | ICD-10-CM | POA: Diagnosis present

## 2014-11-25 DIAGNOSIS — G9340 Encephalopathy, unspecified: Secondary | ICD-10-CM | POA: Diagnosis present

## 2014-11-25 DIAGNOSIS — J9601 Acute respiratory failure with hypoxia: Secondary | ICD-10-CM | POA: Diagnosis not present

## 2014-11-25 DIAGNOSIS — E669 Obesity, unspecified: Secondary | ICD-10-CM | POA: Diagnosis present

## 2014-11-25 DIAGNOSIS — IMO0002 Reserved for concepts with insufficient information to code with codable children: Secondary | ICD-10-CM | POA: Diagnosis present

## 2014-11-25 DIAGNOSIS — F319 Bipolar disorder, unspecified: Secondary | ICD-10-CM | POA: Diagnosis present

## 2014-11-25 DIAGNOSIS — E1165 Type 2 diabetes mellitus with hyperglycemia: Secondary | ICD-10-CM

## 2014-11-25 DIAGNOSIS — G8929 Other chronic pain: Secondary | ICD-10-CM | POA: Diagnosis present

## 2014-11-25 DIAGNOSIS — Z9114 Patient's other noncompliance with medication regimen: Secondary | ICD-10-CM | POA: Diagnosis not present

## 2014-11-25 DIAGNOSIS — F1410 Cocaine abuse, uncomplicated: Secondary | ICD-10-CM | POA: Diagnosis present

## 2014-11-25 DIAGNOSIS — J9600 Acute respiratory failure, unspecified whether with hypoxia or hypercapnia: Secondary | ICD-10-CM

## 2014-11-25 DIAGNOSIS — I10 Essential (primary) hypertension: Secondary | ICD-10-CM | POA: Diagnosis present

## 2014-11-25 DIAGNOSIS — D649 Anemia, unspecified: Secondary | ICD-10-CM | POA: Diagnosis present

## 2014-11-25 DIAGNOSIS — M549 Dorsalgia, unspecified: Secondary | ICD-10-CM | POA: Diagnosis present

## 2014-11-25 DIAGNOSIS — Z9119 Patient's noncompliance with other medical treatment and regimen: Secondary | ICD-10-CM | POA: Diagnosis present

## 2014-11-25 DIAGNOSIS — J449 Chronic obstructive pulmonary disease, unspecified: Secondary | ICD-10-CM | POA: Diagnosis present

## 2014-11-25 DIAGNOSIS — E875 Hyperkalemia: Secondary | ICD-10-CM | POA: Diagnosis present

## 2014-11-25 DIAGNOSIS — Z9049 Acquired absence of other specified parts of digestive tract: Secondary | ICD-10-CM | POA: Diagnosis present

## 2014-11-25 LAB — I-STAT VENOUS BLOOD GAS, ED
Acid-Base Excess: 4 mmol/L — ABNORMAL HIGH (ref 0.0–2.0)
Bicarbonate: 30.7 mEq/L — ABNORMAL HIGH (ref 20.0–24.0)
O2 Saturation: 99 %
PH VEN: 7.37 — AB (ref 7.250–7.300)
TCO2: 32 mmol/L (ref 0–100)
pCO2, Ven: 53.1 mmHg — ABNORMAL HIGH (ref 45.0–50.0)
pO2, Ven: 141 mmHg — ABNORMAL HIGH (ref 30.0–45.0)

## 2014-11-25 LAB — CBC
HEMATOCRIT: 39 % (ref 36.0–46.0)
Hemoglobin: 12.9 g/dL (ref 12.0–15.0)
MCH: 31.2 pg (ref 26.0–34.0)
MCHC: 33.1 g/dL (ref 30.0–36.0)
MCV: 94.4 fL (ref 78.0–100.0)
Platelets: 238 10*3/uL (ref 150–400)
RBC: 4.13 MIL/uL (ref 3.87–5.11)
RDW: 12.6 % (ref 11.5–15.5)
WBC: 12.2 10*3/uL — AB (ref 4.0–10.5)

## 2014-11-25 LAB — LIPASE, BLOOD: Lipase: 28 U/L (ref 11–59)

## 2014-11-25 LAB — CBC WITH DIFFERENTIAL/PLATELET
BASOS ABS: 0 10*3/uL (ref 0.0–0.1)
Basophils Relative: 0 % (ref 0–1)
EOS PCT: 0 % (ref 0–5)
Eosinophils Absolute: 0 10*3/uL (ref 0.0–0.7)
HEMATOCRIT: 41.8 % (ref 36.0–46.0)
Hemoglobin: 13.4 g/dL (ref 12.0–15.0)
LYMPHS PCT: 25 % (ref 12–46)
Lymphs Abs: 3.6 10*3/uL (ref 0.7–4.0)
MCH: 30.5 pg (ref 26.0–34.0)
MCHC: 32.1 g/dL (ref 30.0–36.0)
MCV: 95.2 fL (ref 78.0–100.0)
MONO ABS: 1 10*3/uL (ref 0.1–1.0)
Monocytes Relative: 7 % (ref 3–12)
Neutro Abs: 9.9 10*3/uL — ABNORMAL HIGH (ref 1.7–7.7)
Neutrophils Relative %: 68 % (ref 43–77)
Platelets: 295 10*3/uL (ref 150–400)
RBC: 4.39 MIL/uL (ref 3.87–5.11)
RDW: 12.6 % (ref 11.5–15.5)
WBC: 14.6 10*3/uL — ABNORMAL HIGH (ref 4.0–10.5)

## 2014-11-25 LAB — COMPREHENSIVE METABOLIC PANEL
ALK PHOS: 159 U/L — AB (ref 39–117)
ALT: 21 U/L (ref 0–35)
ANION GAP: 15 (ref 5–15)
AST: 28 U/L (ref 0–37)
Albumin: 3.2 g/dL — ABNORMAL LOW (ref 3.5–5.2)
BILIRUBIN TOTAL: 0.6 mg/dL (ref 0.3–1.2)
BUN: 12 mg/dL (ref 6–23)
CALCIUM: 10.3 mg/dL (ref 8.4–10.5)
CHLORIDE: 93 mmol/L — AB (ref 96–112)
CO2: 25 mmol/L (ref 19–32)
Creatinine, Ser: 0.62 mg/dL (ref 0.50–1.10)
GFR calc Af Amer: >90 mL/min (ref >90)
GLUCOSE: 513 mg/dL — AB (ref 70–99)
POTASSIUM: 4.2 mmol/L (ref 3.5–5.1)
SODIUM: 133 mmol/L — AB (ref 135–145)
Total Protein: 6.9 g/dL (ref 6.0–8.3)

## 2014-11-25 LAB — INFLUENZA PANEL BY PCR (TYPE A & B)
H1N1 flu by pcr: NOT DETECTED
INFLAPCR: NEGATIVE
Influenza B By PCR: NEGATIVE

## 2014-11-25 LAB — RAPID URINE DRUG SCREEN, HOSP PERFORMED
AMPHETAMINES: NOT DETECTED
BARBITURATES: NOT DETECTED
Benzodiazepines: POSITIVE — AB
Cocaine: NOT DETECTED
OPIATES: POSITIVE — AB
Tetrahydrocannabinol: NOT DETECTED

## 2014-11-25 LAB — GLUCOSE, CAPILLARY
GLUCOSE-CAPILLARY: 368 mg/dL — AB (ref 70–99)
GLUCOSE-CAPILLARY: 440 mg/dL — AB (ref 70–99)
Glucose-Capillary: 485 mg/dL — ABNORMAL HIGH (ref 70–99)
Glucose-Capillary: 430 mg/dL — ABNORMAL HIGH (ref 70–99)
Glucose-Capillary: 342 mg/dL — ABNORMAL HIGH (ref 70–99)

## 2014-11-25 LAB — PROTIME-INR
INR: 1.09 (ref 0.00–1.49)
Prothrombin Time: 14.2 seconds (ref 11.6–15.2)

## 2014-11-25 LAB — I-STAT TROPONIN, ED: Troponin i, poc: 0 ng/mL (ref 0.00–0.08)

## 2014-11-25 LAB — CREATININE, SERUM
Creatinine, Ser: 0.55 mg/dL (ref 0.50–1.10)
GFR calc Af Amer: >90 mL/min (ref >90)
GFR calc non Af Amer: >90 mL/min (ref >90)

## 2014-11-25 LAB — LACTIC ACID, PLASMA
LACTIC ACID, VENOUS: 2.1 mmol/L — AB (ref 0.5–2.0)
Lactic Acid, Venous: 1.7 mmol/L (ref 0.5–2.0)

## 2014-11-25 LAB — PROCALCITONIN: PROCALCITONIN: 0.17 ng/mL

## 2014-11-25 LAB — MRSA PCR SCREENING: MRSA BY PCR: NEGATIVE

## 2014-11-25 LAB — I-STAT CG4 LACTIC ACID, ED: Lactic Acid, Venous: 3.64 mmol/L (ref 0.5–2.0)

## 2014-11-25 LAB — APTT: APTT: 28 s (ref 24–37)

## 2014-11-25 LAB — TROPONIN I: TROPONIN I: 0.03 ng/mL (ref <0.031)

## 2014-11-25 LAB — CBG MONITORING, ED: GLUCOSE-CAPILLARY: 357 mg/dL — AB (ref 70–99)

## 2014-11-25 MED ORDER — QUETIAPINE FUMARATE ER 300 MG PO TB24: 600.0000 mg | ORAL_TABLET | Freq: Every evening | ORAL | Status: DC

## 2014-11-25 MED ORDER — LAMOTRIGINE 25 MG PO TABS
125.0000 mg | ORAL_TABLET | Freq: Two times a day (BID) | ORAL | Status: DC
  Administered 2014-11-25 – 2014-11-29 (×9): 125 mg via ORAL
  Filled 2014-11-25 (×10): qty 1

## 2014-11-25 MED ORDER — ACETAMINOPHEN 650 MG RE SUPP: 650.0000 mg | Freq: Four times a day (QID) | RECTAL | Status: DC | PRN

## 2014-11-25 MED ORDER — ACETAMINOPHEN 325 MG PO TABS
650.0000 mg | ORAL_TABLET | Freq: Four times a day (QID) | ORAL | Status: DC | PRN
  Administered 2014-11-25 – 2014-11-29 (×2): 650 mg via ORAL
  Filled 2014-11-25 (×2): qty 2

## 2014-11-25 MED ORDER — CALCIUM CARBONATE 1250 (500 CA) MG PO TABS
1.0000 | ORAL_TABLET | Freq: Two times a day (BID) | ORAL | Status: DC
  Administered 2014-11-25 – 2014-11-29 (×10): 500 mg via ORAL
  Filled 2014-11-25 (×13): qty 1

## 2014-11-25 MED ORDER — INSULIN GLARGINE 100 UNIT/ML ~~LOC~~ SOLN
10.0000 [IU] | Freq: Every day | SUBCUTANEOUS | Status: DC
  Administered 2014-11-25 – 2014-11-26 (×2): 10 [IU] via SUBCUTANEOUS
  Filled 2014-11-25 (×2): qty 0.1

## 2014-11-25 MED ORDER — DULOXETINE HCL 60 MG PO CPEP
120.0000 mg | ORAL_CAPSULE | Freq: Every day | ORAL | Status: DC
  Administered 2014-11-25 – 2014-11-29 (×5): 120 mg via ORAL
  Filled 2014-11-25 (×5): qty 2

## 2014-11-25 MED ORDER — METHYLPREDNISOLONE SODIUM SUCC 40 MG IJ SOLR
40.0000 mg | Freq: Two times a day (BID) | INTRAMUSCULAR | Status: DC
  Administered 2014-11-25 – 2014-11-26 (×4): 40 mg via INTRAVENOUS
  Filled 2014-11-25 (×5): qty 1

## 2014-11-25 MED ORDER — GUAIFENESIN 100 MG/5ML PO SYRP
200.0000 mg | ORAL_SOLUTION | ORAL | Status: DC | PRN
  Administered 2014-11-25 – 2014-11-26 (×5): 200 mg via ORAL
  Filled 2014-11-25 (×7): qty 10

## 2014-11-25 MED ORDER — QUETIAPINE FUMARATE ER 50 MG PO TB24: 100.0000 mg | ORAL_TABLET | Freq: Every day | ORAL | Status: DC

## 2014-11-25 MED ORDER — LORAZEPAM 0.5 MG PO TABS
0.5000 mg | ORAL_TABLET | Freq: Three times a day (TID) | ORAL | Status: DC | PRN
  Administered 2014-11-25 – 2014-11-28 (×9): 0.5 mg via ORAL
  Filled 2014-11-25 (×9): qty 1

## 2014-11-25 MED ORDER — ZOLPIDEM TARTRATE 5 MG PO TABS
5.0000 mg | ORAL_TABLET | Freq: Every evening | ORAL | Status: DC | PRN
  Administered 2014-11-25 – 2014-11-28 (×4): 5 mg via ORAL
  Filled 2014-11-25 (×5): qty 1

## 2014-11-25 MED ORDER — SODIUM CHLORIDE 0.9 % IV SOLN
INTRAVENOUS | Status: AC
  Administered 2014-11-25 – 2014-11-26 (×2): via INTRAVENOUS

## 2014-11-25 MED ORDER — BUDESONIDE 0.25 MG/2ML IN SUSP
0.2500 mg | Freq: Two times a day (BID) | RESPIRATORY_TRACT | Status: DC
  Administered 2014-11-25 – 2014-11-29 (×7): 0.25 mg via RESPIRATORY_TRACT
  Filled 2014-11-25 (×12): qty 2

## 2014-11-25 MED ORDER — SODIUM CHLORIDE 0.9 % IV BOLUS (SEPSIS)
1000.0000 mL | Freq: Once | INTRAVENOUS | Status: AC
  Administered 2014-11-25: 1000 mL via INTRAVENOUS

## 2014-11-25 MED ORDER — NIACIN ER (ANTIHYPERLIPIDEMIC) 500 MG PO TBCR
1000.0000 mg | EXTENDED_RELEASE_TABLET | Freq: Every day | ORAL | Status: DC
  Administered 2014-11-25 – 2014-11-28 (×4): 1000 mg via ORAL
  Filled 2014-11-25 (×5): qty 2

## 2014-11-25 MED ORDER — ALBUTEROL SULFATE (2.5 MG/3ML) 0.083% IN NEBU
2.5000 mg | INHALATION_SOLUTION | RESPIRATORY_TRACT | Status: DC | PRN
  Administered 2014-11-26 (×2): 2.5 mg via RESPIRATORY_TRACT
  Filled 2014-11-25 (×2): qty 3

## 2014-11-25 MED ORDER — LISINOPRIL 5 MG PO TABS
5.0000 mg | ORAL_TABLET | Freq: Every day | ORAL | Status: DC
  Administered 2014-11-25 – 2014-11-29 (×5): 5 mg via ORAL
  Filled 2014-11-25 (×5): qty 1

## 2014-11-25 MED ORDER — CETYLPYRIDINIUM CHLORIDE 0.05 % MT LIQD
7.0000 mL | Freq: Two times a day (BID) | OROMUCOSAL | Status: DC
  Administered 2014-11-25 – 2014-11-28 (×7): 7 mL via OROMUCOSAL

## 2014-11-25 MED ORDER — VITAMIN D3 25 MCG (1000 UNIT) PO TABS
1000.0000 [IU] | ORAL_TABLET | Freq: Every day | ORAL | Status: DC
  Administered 2014-11-25 – 2014-11-29 (×5): 1000 [IU] via ORAL
  Filled 2014-11-25 (×5): qty 1

## 2014-11-25 MED ORDER — DOCUSATE SODIUM 100 MG PO CAPS
100.0000 mg | ORAL_CAPSULE | Freq: Two times a day (BID) | ORAL | Status: DC
  Administered 2014-11-25 – 2014-11-29 (×10): 100 mg via ORAL
  Filled 2014-11-25 (×13): qty 1

## 2014-11-25 MED ORDER — IOHEXOL 350 MG/ML SOLN
80.0000 mL | Freq: Once | INTRAVENOUS | Status: AC | PRN
  Administered 2014-11-25: 80 mL via INTRAVENOUS

## 2014-11-25 MED ORDER — VANCOMYCIN HCL 10 G IV SOLR
1250.0000 mg | Freq: Two times a day (BID) | INTRAVENOUS | Status: DC
  Administered 2014-11-25 – 2014-11-27 (×5): 1250 mg via INTRAVENOUS
  Filled 2014-11-25 (×7): qty 1250

## 2014-11-25 MED ORDER — ONDANSETRON HCL 4 MG/2ML IJ SOLN: 4.0000 mg | Freq: Four times a day (QID) | INTRAMUSCULAR | Status: DC | PRN

## 2014-11-25 MED ORDER — INSULIN ASPART 100 UNIT/ML ~~LOC~~ SOLN
0.0000 [IU] | Freq: Every day | SUBCUTANEOUS | Status: DC
  Administered 2014-11-25 – 2014-11-26 (×2): 4 [IU] via SUBCUTANEOUS
  Administered 2014-11-27: 5 [IU] via SUBCUTANEOUS
  Administered 2014-11-28: 3 [IU] via SUBCUTANEOUS

## 2014-11-25 MED ORDER — INSULIN ASPART 100 UNIT/ML ~~LOC~~ SOLN
10.0000 [IU] | Freq: Once | SUBCUTANEOUS | Status: AC
  Administered 2014-11-25: 10 [IU] via SUBCUTANEOUS
  Filled 2014-11-25: qty 1

## 2014-11-25 MED ORDER — ALBUTEROL SULFATE (2.5 MG/3ML) 0.083% IN NEBU
2.5000 mg | INHALATION_SOLUTION | RESPIRATORY_TRACT | Status: DC
  Administered 2014-11-25 (×4): 2.5 mg via RESPIRATORY_TRACT
  Filled 2014-11-25 (×5): qty 3

## 2014-11-25 MED ORDER — INSULIN ASPART 100 UNIT/ML ~~LOC~~ SOLN
0.0000 [IU] | Freq: Three times a day (TID) | SUBCUTANEOUS | Status: DC
  Administered 2014-11-25: 9 [IU] via SUBCUTANEOUS

## 2014-11-25 MED ORDER — PANTOPRAZOLE SODIUM 40 MG PO TBEC
40.0000 mg | DELAYED_RELEASE_TABLET | Freq: Every day | ORAL | Status: DC
  Administered 2014-11-25 – 2014-11-29 (×5): 40 mg via ORAL
  Filled 2014-11-25 (×5): qty 1

## 2014-11-25 MED ORDER — IPRATROPIUM-ALBUTEROL 0.5-2.5 (3) MG/3ML IN SOLN
3.0000 mL | RESPIRATORY_TRACT | Status: DC
  Administered 2014-11-26 – 2014-11-28 (×16): 3 mL via RESPIRATORY_TRACT
  Filled 2014-11-25 (×16): qty 3

## 2014-11-25 MED ORDER — DEXTROSE 5 % IV SOLN
1.0000 g | Freq: Three times a day (TID) | INTRAVENOUS | Status: DC
  Administered 2014-11-25 – 2014-11-27 (×8): 1 g via INTRAVENOUS
  Filled 2014-11-25 (×12): qty 1

## 2014-11-25 MED ORDER — QUETIAPINE FUMARATE ER 400 MG PO TB24
700.0000 mg | ORAL_TABLET | Freq: Every evening | ORAL | Status: DC
  Administered 2014-11-25 – 2014-11-29 (×5): 700 mg via ORAL
  Filled 2014-11-25 (×5): qty 1

## 2014-11-25 MED ORDER — LAMOTRIGINE 100 MG PO TABS: 100.0000 mg | ORAL_TABLET | Freq: Two times a day (BID) | ORAL | Status: DC

## 2014-11-25 MED ORDER — ONDANSETRON HCL 4 MG PO TABS: 4.0000 mg | ORAL_TABLET | Freq: Four times a day (QID) | ORAL | Status: DC | PRN

## 2014-11-25 MED ORDER — IPRATROPIUM BROMIDE 0.02 % IN SOLN
0.5000 mg | RESPIRATORY_TRACT | Status: DC
  Administered 2014-11-25 (×4): 0.5 mg via RESPIRATORY_TRACT
  Filled 2014-11-25 (×4): qty 2.5

## 2014-11-25 MED ORDER — DEXTROSE 5 % IV SOLN
1.0000 g | Freq: Once | INTRAVENOUS | Status: AC
  Administered 2014-11-25: 1 g via INTRAVENOUS
  Filled 2014-11-25: qty 1

## 2014-11-25 MED ORDER — GABAPENTIN 300 MG PO CAPS
600.0000 mg | ORAL_CAPSULE | Freq: Three times a day (TID) | ORAL | Status: DC
  Administered 2014-11-25 – 2014-11-29 (×14): 600 mg via ORAL
  Filled 2014-11-25 (×15): qty 2

## 2014-11-25 MED ORDER — IPRATROPIUM-ALBUTEROL 0.5-2.5 (3) MG/3ML IN SOLN
3.0000 mL | Freq: Once | RESPIRATORY_TRACT | Status: AC
  Administered 2014-11-25: 3 mL via RESPIRATORY_TRACT
  Filled 2014-11-25: qty 3

## 2014-11-25 MED ORDER — LAMOTRIGINE 25 MG PO TABS: 25.0000 mg | ORAL_TABLET | Freq: Two times a day (BID) | ORAL | Status: DC

## 2014-11-25 MED ORDER — ASPIRIN EC 81 MG PO TBEC
81.0000 mg | DELAYED_RELEASE_TABLET | Freq: Every day | ORAL | Status: DC
  Administered 2014-11-25 – 2014-11-29 (×5): 81 mg via ORAL
  Filled 2014-11-25 (×5): qty 1

## 2014-11-25 MED ORDER — SIMVASTATIN 40 MG PO TABS
40.0000 mg | ORAL_TABLET | Freq: Every day | ORAL | Status: DC
  Administered 2014-11-25 – 2014-11-29 (×5): 40 mg via ORAL
  Filled 2014-11-25 (×5): qty 1

## 2014-11-25 MED ORDER — INSULIN ASPART 100 UNIT/ML ~~LOC~~ SOLN
0.0000 [IU] | Freq: Three times a day (TID) | SUBCUTANEOUS | Status: DC
  Administered 2014-11-25: 20 [IU] via SUBCUTANEOUS
  Administered 2014-11-26 (×3): 15 [IU] via SUBCUTANEOUS
  Administered 2014-11-27: 20 [IU] via SUBCUTANEOUS
  Administered 2014-11-27: 15 [IU] via SUBCUTANEOUS
  Administered 2014-11-27: 20 [IU] via SUBCUTANEOUS
  Administered 2014-11-28: 11 [IU] via SUBCUTANEOUS
  Administered 2014-11-28: 20 [IU] via SUBCUTANEOUS
  Administered 2014-11-28: 11 [IU] via SUBCUTANEOUS
  Administered 2014-11-29 (×2): 20 [IU] via SUBCUTANEOUS
  Administered 2014-11-29: 7 [IU] via SUBCUTANEOUS

## 2014-11-25 MED ORDER — ENOXAPARIN SODIUM 40 MG/0.4ML ~~LOC~~ SOLN
40.0000 mg | SUBCUTANEOUS | Status: DC
  Administered 2014-11-25 – 2014-11-29 (×5): 40 mg via SUBCUTANEOUS
  Filled 2014-11-25 (×5): qty 0.4

## 2014-11-25 NOTE — Progress Notes (Signed)
ANTIBIOTIC CONSULT NOTE - INITIAL  Pharmacy Consult for Vancomycin  Indication: rule out pneumonia  No Known Allergies  Patient Measurements: ~91 kg  Vital Signs: Temp: 99.9 F (37.7 C) (04/15 2338) Temp Source: Rectal (04/15 2338) BP: 130/76 mmHg (04/15 2359) Pulse Rate: 114 (04/15 2359)  Labs:  Recent Labs  11/24/14 2345  WBC 14.6*  HGB 13.4  PLT 295  CREATININE 0.62   Medical History: Past Medical History  Diagnosis Date  . Diabetes mellitus   . Pancreatitis   . Anemia   . Bipolar 1 disorder   . Alcohol abuse   . 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 COPD (chronic obstructive pulmonary disease)   . Hypotension   . Major depressive disorder   . Cocaine abuse   . Chronic back pain   . Renal disorder   . Acidosis   . Hyperkalemia   . Abscess   . Acute encephalopathy   . Septic shock    Assessment: 89 y /o F from assisted living facility, productive cough for last week, WBC mildly elevated, renal function good, CXR not conclusive, other labs as above.   Goal of Therapy:  Vancomycin trough level 15-20 mcg/ml  Plan:  -Vancomycin 1250 mg IV q12h -Cefepime x 1 ordered, f/u additional gram negative coverage -Trend WBC, temp, renal function  -Drug levels as indicated   Narda Bonds 11/25/2014,12:36 AM

## 2014-11-25 NOTE — Progress Notes (Signed)
Pt arrived from ED via stretcher. Pt walked from stretcher to bed. CHG bath given and MRSA swab sent to lab. Vital signs: BP 123/90, HR 109 and O2 saturation at 94% on 2L [place]. Will continue to monitor.  Hart Rochester, RN, BSN

## 2014-11-25 NOTE — Progress Notes (Signed)
Utilization Review Completed.Donne Anon T4/16/2016

## 2014-11-25 NOTE — Progress Notes (Signed)
Patient seen and evaluated earlier this a.m. but my associate. Please refer to his H&P for details regarding assessment and plan.  Patient seen and evaluated today.  Physical exam General: Patient in no acute distress, alert and awake CV: No cyanosis PULM: Breathing comfortable with supplemental oxygen in place, equal chest rise, no audible wheezes  Reassess next a.m. unless acute medical problem occurs  Arlett Goold, [employer]

## 2014-11-25 NOTE — H&P (Signed)
[facility] History and Physical  ZHOEY BLACKSTOCK HEN:[id number] DOB: [birth year] DOA: 11/24/2014  Referring physician: Dr. Ralene Bathe. ER physician. PCP: Moreen Fowler   Chief Complaint: Shortness of breath.  HPI: Sarah Leonard is a 54 y.o. female and history of COPD, diabetes mellitus type 2, chronic pain, bipolar disorder was brought to the ER patient was having increasing shortness of breath. Patient is in assisted living facility and states that she has been having increasing productive cough with wheezing over the last 1 week. Patient states she was given a tapering dose of steroid and antibiotic despite which patient is still short of breath. In the ER chest x-ray did not show anything acute but patient on exam was having bilateral expiratory wheeze and productive cough. Patient also has some pleuritic type of chest pain on deep inspiration. Patient edition states she is getting nauseated and had some abdominal discomfort was in the right upper quadrant. Denies any fever chills the patient was mildly febrile in the ER. In addition patient is tachycardic and lactic acid was elevated. Patient's blood sugar was markedly elevated. Patient's blood sugar improved with fluid bolus. Patient has been admitted for acute respiratory failure probably from COPD exacerbation and uncontrolled diabetes mellitus type 2.  Review of Systems: As presented in the history of presenting illness, rest negative.  Past Medical History  Diagnosis Date  . Diabetes mellitus   . Pancreatitis   . Anemia   . Bipolar 1 disorder   . Alcohol abuse   . 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 COPD (chronic obstructive pulmonary disease)   . Hypotension   . Major depressive disorder   . Cocaine abuse   . Chronic back pain   . Renal disorder   . Acidosis   . Hyperkalemia   . Abscess   . Acute encephalopathy   . Septic shock    Past Surgical History  Procedure Laterality Date  .  Cholecystectomy    . Appendectomy    . Tonsillectomy    . Incision and drainage abscess Right 03/01/2013    Procedure: INCISION AND DRAINAGE ABSCESS;  Surgeon: Harl Bowie, MD;  Location: [place];  Service: General;  Laterality: Right;  . Cesarean section     Social History:  reports that she has been smoking.  She has never used smokeless tobacco. She reports that she uses illicit drugs (Cocaine). She reports that she does not drink alcohol. Where does patient live at assisted living facility. Can patient participate in ADLs? Yes.  No Known Allergies  Family History:  Family History  Problem Relation Age of Onset  . Diabetes Mother   . Cancer Mother     breast      Prior to Admission medications   Medication Sig Start Date End Date Taking? Authorizing Provider  albuterol (PROVENTIL HFA;VENTOLIN HFA) 108 (90 BASE) MCG/ACT inhaler Inhale 2 puffs into the lungs every 4 (four) hours as needed for wheezing or shortness of breath (With spacer). For shortness of breath 08/21/12  Yes Charlotte Sanes, MD  aspirin EC 81 MG tablet Take 81 mg by mouth daily.   Yes Historical Provider, MD  azithromycin (ZITHROMAX Z-PAK) 250 MG tablet Take 1 tablet (250 mg total) by mouth daily. Take 2 tabs for first dose, then 1 tab for each additional dose 11/20/14  Yes Jola Schmidt, MD  Budesonide-Formoterol Fumarate (SYMBICORT IN) Inhale 2 puffs into the lungs 2 (two) times daily.  Yes Historical Provider, MD  calcium carbonate (OS-CAL - DOSED IN MG OF ELEMENTAL CALCIUM) 1250 MG tablet Take 1 tablet by mouth 2 (two) times daily.   Yes Historical Provider, MD  cholecalciferol (VITAMIN D) 1000 UNITS tablet Take 1,000 Units by mouth daily.   Yes Historical Provider, MD  docusate sodium (COLACE) 100 MG capsule Take 100 mg by mouth 2 (two) times daily.   Yes Historical Provider, MD  DULoxetine (CYMBALTA) 60 MG capsule Take 120 mg by mouth daily.    Yes Historical Provider, MD  gabapentin (NEURONTIN) 600 MG tablet  Take 600 mg by mouth 3 (three) times daily.   Yes Historical Provider, MD  guaifenesin (ROBITUSSIN) 100 MG/5ML syrup Take 200 mg by mouth every 4 (four) hours as needed for cough.   Yes Historical Provider, MD  HYDROcodone-acetaminophen (NORCO/VICODIN) 5-325 MG per tablet Take 1 tablet by mouth every 6 (six) hours as needed for pain.   Yes Historical Provider, MD  lamoTRIgine (LAMICTAL) 100 MG tablet Take 100 mg by mouth 2 (two) times daily. Takes with 25 mg tab   Yes Historical Provider, MD  lamoTRIgine (LAMICTAL) 25 MG tablet Take 25 mg by mouth 2 (two) times daily. Takes with 100mg  tablet   Yes Historical Provider, MD  lisinopril (PRINIVIL,ZESTRIL) 5 MG tablet Take 5 mg by mouth daily.   Yes Historical Provider, MD  loperamide (IMODIUM) 2 MG capsule Take 2-4 mg by mouth every 3 (three) hours as needed for diarrhea or loose stools.   Yes Historical Provider, MD  LORazepam (ATIVAN) 0.5 MG tablet Take 0.5 mg by mouth 3 (three) times daily.   Yes Historical Provider, MD  LORazepam (ATIVAN) 1 MG tablet Take 0.5 mg by mouth daily as needed for anxiety.    Yes Historical Provider, MD  metFORMIN (GLUCOPHAGE) 500 MG tablet Take 500 mg by mouth 2 (two) times daily with a meal.   Yes Historical Provider, MD  niacin (NIASPAN) 500 MG CR tablet Take 750 mg by mouth at bedtime.    Yes Historical Provider, MD  omeprazole (PRILOSEC) 20 MG capsule Take 20 mg by mouth daily before supper.    Yes Historical Provider, MD  predniSONE (DELTASONE) 10 MG tablet Take 6 tablets (60 mg total) by mouth daily. 11/20/14  Yes Jola Schmidt, MD  promethazine (PHENERGAN) 12.5 MG tablet Take 12.5 mg by mouth every 6 (six) hours as needed for nausea or vomiting.   Yes Historical Provider, MD  QUEtiapine (SEROQUEL XR) 300 MG 24 hr tablet Take 600 mg by mouth every evening.   Yes Historical Provider, MD  QUEtiapine (SEROQUEL XR) 50 MG TB24 24 hr tablet Take 100 mg by mouth daily. Take with 600mg  to equal 700mg  daily dose   Yes Historical  Provider, MD  simvastatin (ZOCOR) 40 MG tablet Take 40 mg by mouth daily.   Yes Historical Provider, MD  tiotropium (SPIRIVA) 18 MCG inhalation capsule Place 18 mcg into inhaler and inhale daily.   Yes Historical Provider, MD  zolpidem (AMBIEN) 10 MG tablet Take 5 mg by mouth at bedtime.    Yes Historical Provider, MD  acetaminophen (TYLENOL) 325 MG tablet Take 325 mg by mouth 3 (three) times daily. Prior to fish oil    Historical Provider, MD  Fluticasone-Salmeterol (ADVAIR) 250-50 MCG/DOSE AEPB Inhale 1 puff into the lungs every 12 (twelve) hours.    Historical Provider, MD    Physical Exam: Filed Vitals:   11/24/14 2242 11/24/14 2338 11/24/14 2359 11/25/14 0100  BP: 128/65  130/76 103/72  Pulse: 116  114 103  Temp:  99.9 F (37.7 C)    TempSrc:  Rectal    Resp: 23  12 21   SpO2: 91%  93% 96%     General:  Moderately built and nourished.  Eyes: Anicteric no pallor.  ENT: No discharge from the ears eyes nose and mouth.  Neck: No mass felt.  Cardiovascular: S1 and S2 heard.  Respiratory: Bilateral expiratory wheezes are not repetitions.  Abdomen: Mild tenderness in the right upper quadrant and epigastric area. No guarding or rigidity.  Skin: No rash.  Musculoskeletal: No edema.  Psychiatric: Appears normal.  Neurologic: Alert awake oriented to time place and person. Moves all extremities.  Labs on Admission:  Basic Metabolic Panel:  Recent Labs Lab 11/20/14 1225 11/24/14 2345  NA 133* 133*  K 4.2 4.2  CL 96 93*  CO2 25 25  GLUCOSE 342* 513*  BUN 6 12  CREATININE 0.59 0.62  CALCIUM 9.5 10.3   Liver Function Tests:  Recent Labs Lab 11/24/14 2345  AST 28  ALT 21  ALKPHOS 159*  BILITOT 0.6  PROT 6.9  ALBUMIN 3.2*   No results for input(s): LIPASE, AMYLASE in the last 168 hours. No results for input(s): AMMONIA in the last 168 hours. CBC:  Recent Labs Lab 11/20/14 1225 11/24/14 2345  WBC 8.7 14.6*  NEUTROABS 6.4 9.9*  HGB 13.9 13.4  HCT 41.3  41.8  MCV 95.4 95.2  PLT 208 295   Cardiac Enzymes: No results for input(s): CKTOTAL, CKMB, CKMBINDEX, TROPONINI in the last 168 hours.  BNP (last 3 results) No results for input(s): BNP in the last 8760 hours.  ProBNP (last 3 results) No results for input(s): PROBNP in the last 8760 hours.  CBG: No results for input(s): GLUCAP in the last 168 hours.  Radiological Exams on Admission: Dg Chest Port 1 View  11/24/2014   CLINICAL DATA:  Bronchitis.  EXAM: PORTABLE CHEST - 1 VIEW  COMPARISON:  11/20/2014  FINDINGS: There is chronic marked interstitial coarsening, especially in the lower lungs. There is no definitive change when accounting for differences in technique. The lungs are chronically hyperinflated. No edema, effusion, or pneumothorax. Normal heart size and aortic contours.  IMPRESSION: Chronic lung disease. There is no definitive change from 11/20/2014, but note that chronic opacity could obscure pneumonia at the bases.   Electronically Signed   By: Monte Fantasia M.D.   On: 11/24/2014 23:31    EKG: Independently reviewed. Sinus tachycardia with premature atrial complexes.  Assessment/Plan Principal Problem:   Acute respiratory failure Active Problems:   COPD exacerbation   Lactic acidosis   Diabetes mellitus type 2, uncontrolled   Hypertension   1. Acute respiratory failure with hypoxia probably secondary to COPD exacerbation - patient has been placed on IV Solu-Medrol nebulizer Pulmicort and empiric antibiotics. We'll get a CT angiogram of the chest. Closely observe and stepdown unit. Check influenza PCR. 2. Lactic acidosis with possible sepsis - patient has received 2 L normal saline bolus and we will continue infusion and closely follow lactic acid levels. Blood cultures and procalcitonin levels have been ordered. Check UA. Patient is on empiric antibiotics for now. 3. Diabetes mellitus type 2 uncontrolled - not sure if patient is compliant with medication but patient  also was recently on prednisone taper which could have increased patient's blood sugars. Check hemoglobin A1c and at this time I have ordered Lantus 10 units subcutaneous daily and one dose now along with  NovoLog insulin. Patient did receive NovoLog insulin 10 units subcutaneous in the ER. In patient's medications list I do not see any long-acting insulin listed. If patient's blood sugar does not improve may need IV insulin infusion given the patient is also on IV Solu-Medrol at this time. 4. Headache - patient was complaining of global headache and states it's been there for last 2-3 days. Check CT head. Could be from sinusitis. 5. Hypertension - continue present medications. 6. Bipolar disorder - continue present medications.   DVT Prophylaxis Lovenox.  Code Status: Full code.  Family Communication: None.  Disposition Plan: Admit to inpatient.    Breanda Greenlaw N. [facility] Pager [phone].  If 7PM-7AM, please contact night-coverage [email or web address] Password [email or web address] 11/25/2014, 1:56 AM

## 2014-11-25 NOTE — ED Notes (Signed)
Attempt to call report to floor x1. 

## 2014-11-25 NOTE — Progress Notes (Signed)
MD notified about patients blood sugar. New insulin orders placed. Teresita Madura RN

## 2014-11-25 NOTE — Progress Notes (Signed)
Influenza panel negative. Discontinued droplet precautions.

## 2014-11-26 LAB — BASIC METABOLIC PANEL
ANION GAP: 12 (ref 5–15)
BUN: 10 mg/dL (ref 6–23)
CALCIUM: 8.8 mg/dL (ref 8.4–10.5)
CO2: 22 mmol/L (ref 19–32)
Chloride: 98 mmol/L (ref 96–112)
Creatinine, Ser: 0.53 mg/dL (ref 0.50–1.10)
GFR calc Af Amer: >90 mL/min (ref >90)
GFR calc non Af Amer: >90 mL/min (ref >90)
Glucose, Bld: 387 mg/dL — ABNORMAL HIGH (ref 70–99)
Potassium: 4.3 mmol/L (ref 3.5–5.1)
SODIUM: 132 mmol/L — AB (ref 135–145)

## 2014-11-26 LAB — CBC
HCT: 38.8 % (ref 36.0–46.0)
Hemoglobin: 12.8 g/dL (ref 12.0–15.0)
MCH: 31.2 pg (ref 26.0–34.0)
MCHC: 33 g/dL (ref 30.0–36.0)
MCV: 94.6 fL (ref 78.0–100.0)
Platelets: 258 10*3/uL (ref 150–400)
RBC: 4.1 MIL/uL (ref 3.87–5.11)
RDW: 12.7 % (ref 11.5–15.5)
WBC: 11 10*3/uL — ABNORMAL HIGH (ref 4.0–10.5)

## 2014-11-26 LAB — GLUCOSE, CAPILLARY
GLUCOSE-CAPILLARY: 326 mg/dL — AB (ref 70–99)
GLUCOSE-CAPILLARY: 343 mg/dL — AB (ref 70–99)
GLUCOSE-CAPILLARY: 323 mg/dL — AB (ref 70–99)
Glucose-Capillary: 329 mg/dL — ABNORMAL HIGH (ref 70–99)

## 2014-11-26 MED ORDER — METHYLPREDNISOLONE SODIUM SUCC 40 MG IJ SOLR
40.0000 mg | INTRAMUSCULAR | Status: DC
  Administered 2014-11-27: 40 mg via INTRAVENOUS
  Filled 2014-11-26: qty 1

## 2014-11-26 MED ORDER — INSULIN GLARGINE 100 UNIT/ML ~~LOC~~ SOLN
16.0000 [IU] | Freq: Every day | SUBCUTANEOUS | Status: DC
Start: 2014-11-27 — End: 2014-11-27
  Administered 2014-11-27: 16 [IU] via SUBCUTANEOUS
  Filled 2014-11-26: qty 0.16

## 2014-11-26 MED ORDER — TRAMADOL HCL 50 MG PO TABS
50.0000 mg | ORAL_TABLET | Freq: Four times a day (QID) | ORAL | Status: DC | PRN
  Administered 2014-11-26 – 2014-11-28 (×2): 50 mg via ORAL
  Filled 2014-11-26 (×2): qty 1

## 2014-11-26 NOTE — Progress Notes (Signed)
CRITICAL VALUE ALERT  Critical value received:  Blood culture - gram positive cocci in clusters  Date of notification:  11/26/2014  Time of notification:  13:40  Critical value read back:Yes.    Nurse who received alert:  Alphonsus Sias  MD notified (1st page):  Dr, Wendee Beavers  Time of first page:  13:49  MD notified (2nd page):  Time of second page:  Responding MD: MD aware  Time MD responded:

## 2014-11-26 NOTE — Progress Notes (Signed)
Patient trasfered from [facility] to [phone] via stretcher; alert and oriented x 4; no complaints of pain; IV saline locked in LAC and RH; skin intact. Orient patient to room and unit; instructed how to use the call bell and  fall risk precautions. Will continue to monitor the patient.

## 2014-11-26 NOTE — Progress Notes (Signed)
[facility] PROGRESS NOTE  Sarah Leonard XLK:[id number] DOB: 11-21-1960 DOA: 11/24/2014 PCP: Moreen Fowler  Assessment/Plan:  Principal Problem:   Acute respiratory failure - 2ary to pneumonia and copde - continue supportive therapy - Continue current antibiotic therapy  Active Problems:   COPD with acute exacerbation - continue current regimen duoneb, solumedrol, pulmicort, and IV antibiotics - Given active infection and no wheezing will cut down on Solu-Medrol dose    Lactic acidosis - Patient had 1 of 2 blood cultures come back positive - Patient clinically looks much better.    Diabetes mellitus type 2, uncontrolled - Continue carb modified diet - Patient is currently on sliding scale insulin and Lantus, however blood sugars not well controlled as such will increase Lantus from 10 units to 16  Units subcutaneous daily    Hypertension -Patient on lisinopril with last reported blood pressure of 127/67. Currently well-controlled   Code Status: Full Family Communication: No family at bedside Disposition Plan: Pending continued improvement in condition and awaiting blood culture results   Consultants:  None  Procedures:  None  Antibiotics:  Cefepime and vancomycin  HPI/Subjective: Patient states she feels much better today. No new complaints  Objective: Filed Vitals:   11/26/14 1330  BP: 127/67  Pulse: 107  Temp: 98.9 F (37.2 C)  Resp: 16    Intake/Output Summary (Last 24 hours) at 11/26/14 1542 Last data filed at 11/26/14 1100  Gross per 24 hour  Intake   1955 ml  Output   3150 ml  Net  -1195 ml   Filed Weights   11/25/14 0411 11/26/14 1330  Weight: 104.7 kg (230 lb 13.2 oz) 110.678 kg (244 lb)    Exam:   General:  Patient in no acute distress, alert and awake  Cardiovascular: Regular rate and rhythm, no murmurs or rubs  Respiratory: Clear to auscultation bilaterally, no wheezes  Abdomen: Soft, nondistended,  nontender  Musculoskeletal: No cyanosis or clubbing   Data Reviewed: Basic Metabolic Panel:  Recent Labs Lab 11/20/14 1225 11/24/14 2345 11/25/14 0300 11/26/14 0323  NA 133* 133*  --  132*  K 4.2 4.2  --  4.3  CL 96 93*  --  98  CO2 25 25  --  22  GLUCOSE 342* 513*  --  387*  BUN 6 12  --  10  CREATININE 0.59 0.62 0.55 0.53  CALCIUM 9.5 10.3  --  8.8   Liver Function Tests:  Recent Labs Lab 11/24/14 2345  AST 28  ALT 21  ALKPHOS 159*  BILITOT 0.6  PROT 6.9  ALBUMIN 3.2*    Recent Labs Lab 11/25/14 1140  LIPASE 28   No results for input(s): AMMONIA in the last 168 hours. CBC:  Recent Labs Lab 11/20/14 1225 11/24/14 2345 11/25/14 0300 11/26/14 0323  WBC 8.7 14.6* 12.2* 11.0*  NEUTROABS 6.4 9.9*  --   --   HGB 13.9 13.4 12.9 12.8  HCT 41.3 41.8 39.0 38.8  MCV 95.4 95.2 94.4 94.6  PLT 208 295 238 258   Cardiac Enzymes:  Recent Labs Lab 11/25/14 0300 11/25/14 1140  TROPONINI 0.03 <0.03   BNP (last 3 results) No results for input(s): BNP in the last 8760 hours.  ProBNP (last 3 results) No results for input(s): PROBNP in the last 8760 hours.  CBG:  Recent Labs Lab 11/25/14 1249 11/25/14 1555 11/25/14 2157 11/26/14 0808 11/26/14 1225  GLUCAP 440* 430* 342* 326* 343*    Recent Results (from the past 240  hour(s))  Culture, blood (routine x 2)     Status: None (Preliminary result)   Collection Time: 11/24/14 11:45 PM  Result Value Ref Range Status   Specimen Description BLOOD RIGHT HAND  Final   Special Requests BOTTLES DRAWN AEROBIC AND ANAEROBIC 5CC  Final   Culture   Final    GRAM POSITIVE COCCI IN CLUSTERS Note: Gram Stain Report Called to,Read Back By and Verified With: GRETA S 11/26/14 1340 BY SMITHERSJ Performed at [facility]    Report Status PENDING  Incomplete  Culture, blood (routine x 2)     Status: None (Preliminary result)   Collection Time: 11/25/14 12:32 AM  Result Value Ref Range Status   Specimen  Description BLOOD LEFT HAND  Final   Special Requests BOTTLES DRAWN AEROBIC AND ANAEROBIC 5CC  Final   Culture   Final           BLOOD CULTURE RECEIVED NO GROWTH TO DATE CULTURE WILL BE HELD FOR 5 DAYS BEFORE ISSUING A FINAL NEGATIVE REPORT Performed at [facility]    Report Status PENDING  Incomplete  MRSA PCR Screening     Status: None   Collection Time: 11/25/14  2:46 AM  Result Value Ref Range Status   MRSA by PCR NEGATIVE NEGATIVE Final    Comment:        The GeneXpert MRSA Assay (FDA approved for NASAL specimens only), is one component of a comprehensive MRSA colonization surveillance program. It is not intended to diagnose MRSA infection nor to guide or monitor treatment for MRSA infections.      Studies: Ct Head Wo Contrast  11/25/2014   CLINICAL DATA:  Diffuse headache for 4 days. Dizziness for 2-3 days.  EXAM: CT HEAD WITHOUT CONTRAST  TECHNIQUE: Contiguous axial images were obtained from the base of the skull through the vertex without intravenous contrast.  COMPARISON:  07/24/2010  FINDINGS: No intracranial hemorrhage, mass effect, or midline shift. No hydrocephalus. The basilar cisterns are patent. No evidence of territorial infarct. No intracranial fluid collection. Calvarium is intact. There is diffuse mucosal thickening of both maxillary sinuses, ethmoid air cells, and right frontal sinus. Minimal opacification of lower left mastoid air cells.  IMPRESSION: 1.  No acute intracranial abnormality. 2. Sinusitis.   Electronically Signed   By: Jeb Levering M.D.   On: 11/25/2014 02:34   Ct Angio Chest Pe W/cm &/or Wo Cm  11/25/2014   CLINICAL DATA:  54 year old female with a history of COPD, increasing shortness of breath.  EXAM: CT ANGIOGRAPHY CHEST WITH CONTRAST  TECHNIQUE: Multidetector CT imaging of the chest was performed using the standard protocol during bolus administration of intravenous contrast. Multiplanar CT image reconstructions and MIPs were obtained  to evaluate the vascular anatomy.  CONTRAST:  68mL OMNIPAQUE IOHEXOL 350 MG/ML SOLN  COMPARISON:  Chest x-ray 11/24/2014, 11/20/2014. CT of the abdomen and pelvis 03/10/2013 07/26/2012. Chest CT 06/27/2004  FINDINGS: Chest:  Unremarkable soft tissues of the chest wall.  No axillary or supraclavicular adenopathy.  Unremarkable appearance of the thoracic inlet. Unremarkable thyroid.  Unremarkable appearance of the trachea and central airways with no debris or significant thickening.  Multiple mediastinal lymph nodes, suspicious by number though not size. Lymph nodes predominantly involves the lower paratracheal nodal stations, subcarinal nodal stations, and the hilar regions.  Heart size within normal limits. No pericardial fluid/ thickening. New mitral annular calcifications with scattered calcifications of the aortic valve. Calcifications of the left anterior descending coronary artery. Calcifications of  the right coronary artery.  Thoracic aorta unremarkable in course caliber and contour. No aneurysm or dissection flap. Scattered calcifications. Atherosclerotic changes at the branch vessels, which appear patent.  Unremarkable diameter of the main pulmonary artery. No central or lobar filling defects. Beyond this evaluation of the pulmonary arterial tree is limited by the timing of the contrast bolus.  Multiple foci of mixed airspace and interstitial opacities throughout all lobes of the bilateral lungs. The appearance is significantly improved from the only chest CT comparison dated 06/27/2004. The lower lungs appear to be slightly worse than the most recent comparison abdominal CT.  No pleural fluid. Respiratory motion somewhat limits evaluation of the lungs, though there is mild airway thickening present.  Upper abdomen:  Steatosis, with otherwise unremarkable appearance of the upper abdomen.  Musculoskeletal:  No displaced fracture. Anterior osteophyte formation of the thoracic spine.  Review of the MIP images  confirms the above findings.  IMPRESSION: Multiple focal regions of mixed interstitial and airspace disease throughout the bilateral lungs. This is significantly improved from the only comparison chest CT dated 2005, with slight worsening of the lower lungs from the abdominal CT 03/10/2013. Findings are compatible with chronic interstitial disease/scarring, though acute overlying pneumonia is possible.  Multiple borderline lymph nodes of the mediastinum, likely reactive.  Mild atherosclerosis with single vessel coronary artery disease.  Calcifications of the aortic valve and mitral annulus. If there is concern for aortic valve dysfunction, correlation with echocardiogram may be useful.  Steatosis  Signed,  Dulcy Fanny. Earleen Newport, DO  [facility]  [facility]   Electronically Signed   By: Corrie Mckusick D.O.   On: 11/25/2014 08:52   US Abdomen Complete  11/25/2014   CLINICAL DATA:  Abdominal pain  EXAM: ULTRASOUND ABDOMEN COMPLETE  COMPARISON:  03/10/2013  FINDINGS: Gallbladder: Surgically removed  Common bile duct: Diameter: 4.1 mm.  Liver: Heterogeneity is identified although no focal mass lesion is seen.  IVC: No abnormality visualized.  Pancreas: Not well visualized due to overlying bowel gas.  Spleen: Size and appearance within normal limits.  Right Kidney: Length: 13.1 cm. Echogenicity within normal limits. No mass or hydronephrosis visualized.  Left Kidney: Length: 14.5 cm. 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 Mild fullness is noted in the inferior collecting system similar to that seen on prior CT examination.  Abdominal aorta: No aneurysm visualized.  Other findings: None.  IMPRESSION: Status post cholecystectomy.  No acute abnormality is identified.   Electronically Signed   By: Inez Catalina M.D.   On: 11/25/2014 09:30   Dg Chest Port 1 View  11/24/2014   CLINICAL DATA:  Bronchitis.  EXAM: PORTABLE  CHEST - 1 VIEW  COMPARISON:  11/20/2014  FINDINGS: There is chronic marked interstitial coarsening, especially in the lower lungs. There is no definitive change when accounting for differences in technique. The lungs are chronically hyperinflated. No edema, effusion, or pneumothorax. Normal heart size and aortic contours.  IMPRESSION: Chronic lung disease. There is no definitive change from 11/20/2014, but note that chronic opacity could obscure pneumonia at the bases.   Electronically Signed   By: Monte Fantasia M.D.   On: 11/24/2014 23:31    Scheduled Meds: . antiseptic oral rinse  7 mL Mouth Rinse BID  . aspirin EC  81 mg Oral Daily  . budesonide (PULMICORT) nebulizer solution  0.25 mg Nebulization BID  . calcium carbonate  1 tablet Oral BID WC  .  ceFEPime (MAXIPIME) IV  1 g Intravenous 3 times per day  . cholecalciferol  1,000 Units Oral Daily  . docusate sodium  100 mg Oral BID  . DULoxetine  120 mg Oral Daily  . enoxaparin (LOVENOX) injection  40 mg Subcutaneous Q24H  . gabapentin  600 mg Oral TID  . insulin aspart  0-20 Units Subcutaneous TID WC  . insulin aspart  0-5 Units Subcutaneous QHS  . insulin glargine  10 Units Subcutaneous Daily  . ipratropium-albuterol  3 mL Nebulization Q4H  . lamoTRIgine  125 mg Oral BID  . lisinopril  5 mg Oral Daily  . methylPREDNISolone (SOLU-MEDROL) injection  40 mg Intravenous Q12H  . niacin  1,000 mg Oral QHS  . pantoprazole  40 mg Oral Daily  . QUEtiapine  700 mg Oral QPM  . simvastatin  40 mg Oral Daily  . vancomycin  1,250 mg Intravenous Q12H   Continuous Infusions:   Time spent: > 35 minutes    Velvet Bathe  [facility] Pager [phone] If 7PM-7AM, please contact night-coverage at [email or web address], password [facility] 11/26/2014, 3:42 PM  LOS: 1 day

## 2014-11-27 LAB — GLUCOSE, CAPILLARY
GLUCOSE-CAPILLARY: 495 mg/dL — AB (ref 70–99)
GLUCOSE-CAPILLARY: 321 mg/dL — AB (ref 70–99)
GLUCOSE-CAPILLARY: 364 mg/dL — AB (ref 70–99)
GLUCOSE-CAPILLARY: 367 mg/dL — AB (ref 70–99)
Glucose-Capillary: 354 mg/dL — ABNORMAL HIGH (ref 70–99)

## 2014-11-27 LAB — CBC
HCT: 38.8 % (ref 36.0–46.0)
HEMOGLOBIN: 12.7 g/dL (ref 12.0–15.0)
MCH: 31.4 pg (ref 26.0–34.0)
MCHC: 32.7 g/dL (ref 30.0–36.0)
MCV: 95.8 fL (ref 78.0–100.0)
Platelets: 299 10*3/uL (ref 150–400)
RBC: 4.05 MIL/uL (ref 3.87–5.11)
RDW: 12.6 % (ref 11.5–15.5)
WBC: 10.7 10*3/uL — ABNORMAL HIGH (ref 4.0–10.5)

## 2014-11-27 LAB — HEMOGLOBIN A1C
Hgb A1c MFr Bld: 12.2 % — ABNORMAL HIGH (ref 4.8–5.6)
Mean Plasma Glucose: 303 mg/dL

## 2014-11-27 LAB — CULTURE, BLOOD (ROUTINE X 2)

## 2014-11-27 MED ORDER — METHYLPREDNISOLONE SODIUM SUCC 40 MG IJ SOLR
40.0000 mg | Freq: Three times a day (TID) | INTRAMUSCULAR | Status: DC
Start: 2014-11-27 — End: 2014-11-28
  Administered 2014-11-27: 40 mg via INTRAVENOUS
  Filled 2014-11-27 (×5): qty 1

## 2014-11-27 MED ORDER — INSULIN GLARGINE 100 UNIT/ML ~~LOC~~ SOLN
25.0000 [IU] | Freq: Every day | SUBCUTANEOUS | Status: DC
  Administered 2014-11-28 – 2014-11-29 (×2): 25 [IU] via SUBCUTANEOUS
  Filled 2014-11-27 (×5): qty 0.25

## 2014-11-27 NOTE — Progress Notes (Signed)
[facility] notified that pt had refused 2 IV attempts by IV team and that Maxipime and solumedrol is ordered. Hold the IV medications was ordered. Arthor Captain LPN

## 2014-11-27 NOTE — Progress Notes (Signed)
Inpatient Diabetes Program Recommendations  AACE/ADA: New Consensus Statement on Inpatient Glycemic Control (2013)  Target Ranges:  Prepandial:   less than 140 mg/dL      Peak postprandial:   less than 180 mg/dL (1-2 hours)      Critically ill patients:  140 - 180 mg/dL   Inpatient Diabetes Program Recommendations Insulin - Basal: Pt on solumedrol 40 q 12 hrs. Basal insulin needs increase. Noted increase of lantus from 10 units to 16 units. However, using a weight based basal regimen of 0.4 units/kg (while on high dose steroid therapy), basal would total 44 units. Recommend increase lantus to 1/2 that dose with 20-25  units [email or web address].art  Once off the solumedrol, the basal needs should not be as high.  If prednisone is then used, the meal coverage may need be added. Will follow.  Thank you Rosita Kea, RN, MSN, CDE  Diabetes Inpatient Program Office: [phone] Pager: [phone] 8:00 am to 5:00 pm

## 2014-11-27 NOTE — Clinical Documentation Improvement (Signed)
  Query #1 "Possible sepsis" documented in the H&P.  Please clarify and document if the diagnosis of "Possible Sepsis" has been:  - Ruled Out  - Confirmed and Present on Admission  - Unable to clinically determine   Query #2 1 of 2 positive blood cultures drawn on admission - staphylococcus species (coagulase negative).  If known or able to determine, please document the clinical significance of the positive blood cx, if any, in the progress notes and discharge summary    Thank You, Erling Conte ,RN Clinical Documentation Specialist:  [facility]

## 2014-11-27 NOTE — Progress Notes (Signed)
[facility] PROGRESS NOTE  Sarah Leonard RWE:[id number] DOB: [birth year] DOA: 11/24/2014 PCP: Moreen Fowler  Assessment/Plan:  Principal Problem:   Acute respiratory failure - 2ary to pneumonia and copde - continue supportive therapy - Continue current antibiotic therapy  Active Problems:   COPD with acute exacerbation - continue current regimen duoneb, solumedrol, pulmicort, and IV antibiotics -Not much improvement as such will increase Solu-Medrol frequency to every 8 hours    Lactic acidosis - Patient had 1 of 2 blood cultures come back positive, but based on results most likely contaminants such will discontinue vancomycin - Patient clinically looks much better.    Diabetes mellitus type 2, uncontrolled - Continue carb modified diet - Patient is currently on sliding scale insulin and Lantus, however blood sugars not well controlled as such will increase Lantus to 25 units subcutaneous daily. Patient has not been compliant with diabetic medication and I have urged patient to eat a diabetic diet moving forward    Hypertension -Relatively well controlled as such will continue current medication regimen   Code Status: Full Family Communication: No family at bedside Disposition Plan: Pending continued improvement in condition and awaiting blood culture results   Consultants:  None  Procedures:  None  Antibiotics:  Cefepime and vancomycin  HPI/Subjective: Patient states she feels much better today. No new complaints  Objective: Filed Vitals:   11/27/14 1402  BP: 148/85  Pulse: 96  Temp: 99.1 F (37.3 C)  Resp:     Intake/Output Summary (Last 24 hours) at 11/27/14 1633 Last data filed at 11/27/14 1530  Gross per 24 hour  Intake   2182 ml  Output   4150 ml  Net  -1968 ml   Filed Weights   11/25/14 0411 11/26/14 1330  Weight: 104.7 kg (230 lb 13.2 oz) 110.678 kg (244 lb)    Exam:   General:  Patient in no acute distress, alert and  awake  Cardiovascular: Regular rate and rhythm, no murmurs or rubs  Respiratory: Clear to auscultation bilaterally, no wheezes  Abdomen: Soft, nondistended, nontender  Musculoskeletal: No cyanosis or clubbing   Data Reviewed: Basic Metabolic Panel:  Recent Labs Lab 11/24/14 2345 11/25/14 0300 11/26/14 0323  NA 133*  --  132*  K 4.2  --  4.3  CL 93*  --  98  CO2 25  --  22  GLUCOSE 513*  --  387*  BUN 12  --  10  CREATININE 0.62 0.55 0.53  CALCIUM 10.3  --  8.8   Liver Function Tests:  Recent Labs Lab 11/24/14 2345  AST 28  ALT 21  ALKPHOS 159*  BILITOT 0.6  PROT 6.9  ALBUMIN 3.2*    Recent Labs Lab 11/25/14 1140  LIPASE 28   No results for input(s): AMMONIA in the last 168 hours. CBC:  Recent Labs Lab 11/24/14 2345 11/25/14 0300 11/26/14 0323 11/27/14 0740  WBC 14.6* 12.2* 11.0* 10.7*  NEUTROABS 9.9*  --   --   --   HGB 13.4 12.9 12.8 12.7  HCT 41.8 39.0 38.8 38.8  MCV 95.2 94.4 94.6 95.8  PLT 295 238 258 299   Cardiac Enzymes:  Recent Labs Lab 11/25/14 0300 11/25/14 1140  TROPONINI 0.03 <0.03   BNP (last 3 results) No results for input(s): BNP in the last 8760 hours.  ProBNP (last 3 results) No results for input(s): PROBNP in the last 8760 hours.  CBG:  Recent Labs Lab 11/26/14 1225 11/26/14 1717 11/26/14 2138 11/27/14 0816 11/27/14  [place]* 323* 329* 321* 354*    Recent Results (from the past 240 hour(s))  Culture, blood (routine x 2)     Status: None   Collection Time: 11/24/14 11:45 PM  Result Value Ref Range Status   Specimen Description BLOOD RIGHT HAND  Final   Special Requests BOTTLES DRAWN AEROBIC AND ANAEROBIC 5CC  Final   Culture   Final    STAPHYLOCOCCUS SPECIES (COAGULASE NEGATIVE) Note: THE SIGNIFICANCE OF ISOLATING THIS ORGANISM FROM A SINGLE SET OF BLOOD CULTURES WHEN MULTIPLE SETS ARE DRAWN IS UNCERTAIN. PLEASE NOTIFY THE MICROBIOLOGY DEPARTMENT WITHIN ONE WEEK IF SPECIATION AND SENSITIVITIES ARE  REQUIRED. Note: Gram Stain Report Called to,Read Back By and Verified With: GRETA S 11/26/14 1340 BY SMITHERSJ Performed at [facility]    Report Status 11/27/2014 FINAL  Final  Culture, blood (routine x 2)     Status: None (Preliminary result)   Collection Time: 11/25/14 12:32 AM  Result Value Ref Range Status   Specimen Description BLOOD LEFT HAND  Final   Special Requests BOTTLES DRAWN AEROBIC AND ANAEROBIC 5CC  Final   Culture   Final           BLOOD CULTURE RECEIVED NO GROWTH TO DATE CULTURE WILL BE HELD FOR 5 DAYS BEFORE ISSUING A FINAL NEGATIVE REPORT Performed at [facility]    Report Status PENDING  Incomplete  MRSA PCR Screening     Status: None   Collection Time: 11/25/14  2:46 AM  Result Value Ref Range Status   MRSA by PCR NEGATIVE NEGATIVE Final    Comment:        The GeneXpert MRSA Assay (FDA approved for NASAL specimens only), is one component of a comprehensive MRSA colonization surveillance program. It is not intended to diagnose MRSA infection nor to guide or monitor treatment for MRSA infections.      Studies: No results found.  Scheduled Meds: . antiseptic oral rinse  7 mL Mouth Rinse BID  . aspirin EC  81 mg Oral Daily  . budesonide (PULMICORT) nebulizer solution  0.25 mg Nebulization BID  . calcium carbonate  1 tablet Oral BID WC  . ceFEPime (MAXIPIME) IV  1 g Intravenous 3 times per day  . cholecalciferol  1,000 Units Oral Daily  . docusate sodium  100 mg Oral BID  . DULoxetine  120 mg Oral Daily  . enoxaparin (LOVENOX) injection  40 mg Subcutaneous Q24H  . gabapentin  600 mg Oral TID  . insulin aspart  0-20 Units Subcutaneous TID WC  . insulin aspart  0-5 Units Subcutaneous QHS  . [START ON 11/28/2014] insulin glargine  25 Units Subcutaneous Daily  . ipratropium-albuterol  3 mL Nebulization Q4H  . lamoTRIgine  125 mg Oral BID  . lisinopril  5 mg Oral Daily  . methylPREDNISolone (SOLU-MEDROL) injection  40 mg Intravenous Q24H   . niacin  1,000 mg Oral QHS  . pantoprazole  40 mg Oral Daily  . QUEtiapine  700 mg Oral QPM  . simvastatin  40 mg Oral Daily  . vancomycin  1,250 mg Intravenous Q12H   Continuous Infusions:   Time spent: > 35 minutes    Velvet Bathe  [facility] Pager [phone] If 7PM-7AM, please contact night-coverage at [email or web address], password [facility] 11/27/2014, 4:33 PM  LOS: 2 days

## 2014-11-28 LAB — GLUCOSE, CAPILLARY
GLUCOSE-CAPILLARY: 421 mg/dL — AB (ref 70–99)
GLUCOSE-CAPILLARY: 294 mg/dL — AB (ref 70–99)
Glucose-Capillary: 295 mg/dL — ABNORMAL HIGH (ref 70–99)
Glucose-Capillary: 272 mg/dL — ABNORMAL HIGH (ref 70–99)

## 2014-11-28 MED ORDER — PREDNISONE 50 MG PO TABS
50.0000 mg | ORAL_TABLET | Freq: Every day | ORAL | Status: DC
  Administered 2014-11-29: 50 mg via ORAL
  Filled 2014-11-28 (×2): qty 1

## 2014-11-28 MED ORDER — METHYLPREDNISOLONE SODIUM SUCC 40 MG IJ SOLR
40.0000 mg | Freq: Every day | INTRAMUSCULAR | Status: DC
Start: 2014-11-28 — End: 2014-11-28
  Filled 2014-11-28: qty 1

## 2014-11-28 MED ORDER — LEVOFLOXACIN 750 MG PO TABS
750.0000 mg | ORAL_TABLET | Freq: Every day | ORAL | Status: DC
  Administered 2014-11-28 – 2014-11-29 (×2): 750 mg via ORAL
  Filled 2014-11-28 (×2): qty 1

## 2014-11-28 MED ORDER — IPRATROPIUM-ALBUTEROL 0.5-2.5 (3) MG/3ML IN SOLN
3.0000 mL | Freq: Three times a day (TID) | RESPIRATORY_TRACT | Status: DC
  Administered 2014-11-29 (×2): 3 mL via RESPIRATORY_TRACT
  Filled 2014-11-28 (×2): qty 3

## 2014-11-28 NOTE — Progress Notes (Signed)
Utilization review complete 

## 2014-11-28 NOTE — Progress Notes (Signed)
MD Vega notified of patient's CBG of 421. Orders received to give 20 units of Novolog now. Will continue to monitor.

## 2014-11-28 NOTE — Progress Notes (Signed)
ANTIBIOTIC CONSULT NOTE - FOLLOW UP  Pharmacy Consult for Levaquin Indication: CAP  No Known Allergies  Patient Measurements: Height: '5\' 3"'$  (160 cm) Weight: 244 lb (110.678 kg) IBW/kg (Calculated) : 52.4  Vital Signs: Temp: 97.8 F (36.6 C) (04/19 0451) Temp Source: Oral (04/19 0451) BP: 107/90 mmHg (04/19 1007) Pulse Rate: 82 (04/19 1007) Intake/Output from previous day: 04/18 0701 - 04/19 0700 In: 1842 [P.O.:1842] Out: 4026 [Urine:4025; Stool:1] Intake/Output from this shift: Total I/O In: 240 [P.O.:240] Out: 300 [Urine:300]  Labs:  Recent Labs  11/26/14 0323 11/27/14 0740  WBC 11.0* 10.7*  HGB 12.8 12.7  PLT 258 299  CREATININE 0.53  --    Estimated Creatinine Clearance: 96.1 mL/min (by C-G formula based on Cr of 0.53). No results for input(s): VANCOTROUGH, VANCOPEAK, VANCORANDOM, GENTTROUGH, GENTPEAK, GENTRANDOM, TOBRATROUGH, TOBRAPEAK, TOBRARND, AMIKACINPEAK, AMIKACINTROU, AMIKACIN in the last 72 hours.   Microbiology: Recent Results (from the past 720 hour(s))  Culture, blood (routine x 2)     Status: None   Collection Time: 11/24/14 11:45 PM  Result Value Ref Range Status   Specimen Description BLOOD RIGHT HAND  Final   Special Requests BOTTLES DRAWN AEROBIC AND ANAEROBIC 5CC  Final   Culture   Final    STAPHYLOCOCCUS SPECIES (COAGULASE NEGATIVE) Note: THE SIGNIFICANCE OF ISOLATING THIS ORGANISM FROM A SINGLE SET OF BLOOD CULTURES WHEN MULTIPLE SETS ARE DRAWN IS UNCERTAIN. PLEASE NOTIFY THE MICROBIOLOGY DEPARTMENT WITHIN ONE WEEK IF SPECIATION AND SENSITIVITIES ARE REQUIRED. Note: Gram Stain Report Called to,Read Back By and Verified With: GRETA S 11/26/14 1340 BY SMITHERSJ Performed at [facility]    Report Status 11/27/2014 FINAL  Final  Culture, blood (routine x 2)     Status: None (Preliminary result)   Collection Time: 11/25/14 12:32 AM  Result Value Ref Range Status   Specimen Description BLOOD LEFT HAND  Final   Special Requests BOTTLES  DRAWN AEROBIC AND ANAEROBIC 5CC  Final   Culture   Final           BLOOD CULTURE RECEIVED NO GROWTH TO DATE CULTURE WILL BE HELD FOR 5 DAYS BEFORE ISSUING A FINAL NEGATIVE REPORT Performed at [facility]    Report Status PENDING  Incomplete  MRSA PCR Screening     Status: None   Collection Time: 11/25/14  2:46 AM  Result Value Ref Range Status   MRSA by PCR NEGATIVE NEGATIVE Final    Comment:        The GeneXpert MRSA Assay (FDA approved for NASAL specimens only), is one component of a comprehensive MRSA colonization surveillance program. It is not intended to diagnose MRSA infection nor to guide or monitor treatment for MRSA infections.     Anti-infectives    Start     Dose/Rate Route Frequency Ordered Stop   11/25/14 0800  ceFEPIme (MAXIPIME) 1 g in dextrose 5 % 50 mL IVPB  Status:  Discontinued     1 g 100 mL/hr over 30 Minutes Intravenous 3 times per day 11/25/14 0215 11/28/14 1052   11/25/14 0100  vancomycin (VANCOCIN) 1,250 mg in sodium chloride 0.9 % 250 mL IVPB  Status:  Discontinued     1,250 mg 166.7 mL/hr over 90 Minutes Intravenous Every 12 hours 11/25/14 0040 11/27/14 1636   11/25/14 0030  ceFEPIme (MAXIPIME) 1 g in dextrose 5 % 50 mL IVPB     1 g 100 mL/hr over 30 Minutes Intravenous  Once 11/25/14 0023 11/25/14 0250  Assessment: 56 y /o F with hx of COPD, presented with productive cough for last week, started vancomycin/cefepime on 4/16, afebrile, WBC mildly elevated and trending down slightly. Now to transition abx to levaquin for CAP. Scr 0.53, est. crcl > 90 ml/min.   Vanc 4/16>4/18 Cefepime 4/16>  4/15 BCx: CNS 4/16 BCx: ngtd  Plan:  - Levaquin 750 mg PO daily x 5 days. - Pharmacy sign off.  Thanks!  Maryanna Shape, PharmD, BCPS  Clinical Pharmacist  Pager: [phone]   11/28/2014,10:53 AM

## 2014-11-28 NOTE — Clinical Social Work Psychosocial (Addendum)
Clinical Social Work Department BRIEF PSYCHOSOCIAL ASSESSMENT 11/28/2014  Patient:  Sarah Leonard,Sarah Leonard     Account Number:  [id number]     Admit date:  11/24/2014  Clinical Social Worker:  , BRYANT, LCSWA  Date/Time:  11/28/2014 04:49 PM  Referred by:  Physician  Date Referred:  11/28/2014 Referred for  ALF Placement   Other Referral:   Patient was admitted from [facility].   Interview type:  Patient Other interview type:   Patient alert and oriented at time of assessment.    PSYCHOSOCIAL DATA Living Status:  FACILITY Admitted from facility:  [facility] Level of care:  Assisted Living Primary support name:  Pattie and Margie Primary support relationship to patient:  SIBLING Degree of support available:   Support is fair.    CURRENT CONCERNS Current Concerns  Post-Acute Placement   Other Concerns:   NA    SOCIAL WORK ASSESSMENT / PLAN CSW met with patient at bedside to complete assessment. Patient appears quite worried as she states she is afraid [facility] will not let her return. The patient was admitted from [facility] where she has lived for 10 years. She states that she is happy with this facility and wants to return at discharge. Patient states that there was a recent incident where she tested positive for THC and is afraid the facility will kick her out for this. CSW explained that we will commiunicate with the facility to determine whether or not the patient will be able to return. Patient reports that she would be very upset if she has to leave [facility] and go to a different facility. Patient denies any consistent substance use and states, "I only smoked pot because it was my son's birthday."  CSW will assist.   Assessment/plan status:  Psychosocial Support/Ongoing Assessment of Needs Other assessment/ plan:   Complete Fl2, Fax, PASRR   Information/referral to community resources:   CSW contact information given.     PATIENT'S/FAMILY'S RESPONSE TO PLAN OF CARE: Patient plans to return to [facility] if facility will accept her back at discharge. CSW will assist.         Bryant  MSW, LCSWA, LCASA, [phone] 

## 2014-11-28 NOTE — Progress Notes (Signed)
[facility] PROGRESS NOTE  Sarah Leonard ZOX:[id number] DOB: 22-Nov-1960 DOA: 11/24/2014 PCP: Moreen Fowler  Brief narrative: Patient is a 54 year old with history of COPD who presented to the hospital with acute COPD exacerbation. Patient had 1 out of 2 blood cultures positive which is felt to be secondary to contaminant upon further evaluation. Patient has also had trouble adhering to a diabetic diet and as such in lieu of high steroid dose patient has had difficult to control blood sugars.  Assessment/Plan:  Principal Problem:   Acute respiratory failure - 2ary to pneumonia and copde - continue supportive therapy - Continue current antibiotic therapy  Active Problems:   COPD with acute exacerbation - continue current regimen duoneb, solumedrol, pulmicort, and IV antibiotics - Given improvement in condition and lack of patient wanting IV medication will transition to oral steroids - Transition to oral steroids given improvement in clinical condition    Lactic acidosis - Patient had 1 of 2 blood cultures come back positive, but based on results most likely contaminants such will discontinue vancomycin - Patient clinically looks much better.    Diabetes mellitus type 2, uncontrolled - Continue carb modified diet - Patient is currently on sliding scale insulin and Lantus, however blood sugars not well controlled as such will increase Lantus to 25 units subcutaneous daily. Patient has not been compliant with diabetic medication and I have urged patient to eat a diabetic diet moving forward    Hypertension -Relatively well controlled as such will continue current medication regimen   Code Status: Full Family Communication: No family at bedside Disposition Plan: Pending continued improvement in condition and awaiting blood culture results   Consultants:  None  Procedures:  None  Antibiotics:  Cefepime and vancomycin  HPI/Subjective: Patient states she feels  much better today. No new complaints. Nursing reports patient is still drinking regular sodas  Objective: Filed Vitals:   11/28/14 1340  BP: 155/94  Pulse: 95  Temp: 98.5 F (36.9 C)  Resp: 18    Intake/Output Summary (Last 24 hours) at 11/28/14 1445 Last data filed at 11/28/14 0900  Gross per 24 hour  Intake   1380 ml  Output   3626 ml  Net  -2246 ml   Filed Weights   11/25/14 0411 11/26/14 1330  Weight: 104.7 kg (230 lb 13.2 oz) 110.678 kg (244 lb)    Exam:   General:  Patient in no acute distress, alert and awake  Cardiovascular: Regular rate and rhythm, no murmurs or rubs  Respiratory: Clear to auscultation bilaterally, no wheezes  Abdomen: Soft, nondistended, nontender  Musculoskeletal: No cyanosis or clubbing   Data Reviewed: Basic Metabolic Panel:  Recent Labs Lab 11/24/14 2345 11/25/14 0300 11/26/14 0323  NA 133*  --  132*  K 4.2  --  4.3  CL 93*  --  98  CO2 25  --  22  GLUCOSE 513*  --  387*  BUN 12  --  10  CREATININE 0.62 0.55 0.53  CALCIUM 10.3  --  8.8   Liver Function Tests:  Recent Labs Lab 11/24/14 2345  AST 28  ALT 21  ALKPHOS 159*  BILITOT 0.6  PROT 6.9  ALBUMIN 3.2*    Recent Labs Lab 11/25/14 1140  LIPASE 28   No results for input(s): AMMONIA in the last 168 hours. CBC:  Recent Labs Lab 11/24/14 2345 11/25/14 0300 11/26/14 0323 11/27/14 0740  WBC 14.6* 12.2* 11.0* 10.7*  NEUTROABS 9.9*  --   --   --  HGB 13.4 12.9 12.8 12.7  HCT 41.8 39.0 38.8 38.8  MCV 95.2 94.4 94.6 95.8  PLT 295 238 258 299   Cardiac Enzymes:  Recent Labs Lab 11/25/14 0300 11/25/14 1140  TROPONINI 0.03 <0.03   BNP (last 3 results) No results for input(s): BNP in the last 8760 hours.  ProBNP (last 3 results) No results for input(s): PROBNP in the last 8760 hours.  CBG:  Recent Labs Lab 11/27/14 1159 11/27/14 1708 11/27/14 2227 11/28/14 0835 11/28/14 1202  GLUCAP 354* 364* 367* 295* 421*    Recent Results (from the  past 240 hour(s))  Culture, blood (routine x 2)     Status: None   Collection Time: 11/24/14 11:45 PM  Result Value Ref Range Status   Specimen Description BLOOD RIGHT HAND  Final   Special Requests BOTTLES DRAWN AEROBIC AND ANAEROBIC 5CC  Final   Culture   Final    STAPHYLOCOCCUS SPECIES (COAGULASE NEGATIVE) Note: THE SIGNIFICANCE OF ISOLATING THIS ORGANISM FROM A SINGLE SET OF BLOOD CULTURES WHEN MULTIPLE SETS ARE DRAWN IS UNCERTAIN. PLEASE NOTIFY THE MICROBIOLOGY DEPARTMENT WITHIN ONE WEEK IF SPECIATION AND SENSITIVITIES ARE REQUIRED. Note: Gram Stain Report Called to,Read Back By and Verified With: GRETA S 11/26/14 1340 BY SMITHERSJ Performed at [facility]    Report Status 11/27/2014 FINAL  Final  Culture, blood (routine x 2)     Status: None (Preliminary result)   Collection Time: 11/25/14 12:32 AM  Result Value Ref Range Status   Specimen Description BLOOD LEFT HAND  Final   Special Requests BOTTLES DRAWN AEROBIC AND ANAEROBIC 5CC  Final   Culture   Final           BLOOD CULTURE RECEIVED NO GROWTH TO DATE CULTURE WILL BE HELD FOR 5 DAYS BEFORE ISSUING A FINAL NEGATIVE REPORT Performed at [facility]    Report Status PENDING  Incomplete  MRSA PCR Screening     Status: None   Collection Time: 11/25/14  2:46 AM  Result Value Ref Range Status   MRSA by PCR NEGATIVE NEGATIVE Final    Comment:        The GeneXpert MRSA Assay (FDA approved for NASAL specimens only), is one component of a comprehensive MRSA colonization surveillance program. It is not intended to diagnose MRSA infection nor to guide or monitor treatment for MRSA infections.      Studies: No results found.  Scheduled Meds: . antiseptic oral rinse  7 mL Mouth Rinse BID  . aspirin EC  81 mg Oral Daily  . budesonide (PULMICORT) nebulizer solution  0.25 mg Nebulization BID  . calcium carbonate  1 tablet Oral BID WC  . cholecalciferol  1,000 Units Oral Daily  . docusate sodium  100 mg Oral  BID  . DULoxetine  120 mg Oral Daily  . enoxaparin (LOVENOX) injection  40 mg Subcutaneous Q24H  . gabapentin  600 mg Oral TID  . insulin aspart  0-20 Units Subcutaneous TID WC  . insulin aspart  0-5 Units Subcutaneous QHS  . insulin glargine  25 Units Subcutaneous Daily  . ipratropium-albuterol  3 mL Nebulization Q4H  . lamoTRIgine  125 mg Oral BID  . levofloxacin  750 mg Oral Daily  . lisinopril  5 mg Oral Daily  . niacin  1,000 mg Oral QHS  . pantoprazole  40 mg Oral Daily  . [START ON 11/29/2014] predniSONE  50 mg Oral Q breakfast  . QUEtiapine  700 mg Oral QPM  .  simvastatin  40 mg Oral Daily   Continuous Infusions:   Time spent: > 35 minutes    Velvet Bathe  [facility] Pager [phone] If 7PM-7AM, please contact night-coverage at [email or web address], password [facility] 11/28/2014, 2:45 PM  LOS: 3 days

## 2014-11-28 NOTE — Care Management Note (Signed)
    Page 1 of 2   11/29/2014     2:12:06 PM CARE MANAGEMENT NOTE 11/29/2014  Patient:  Sarah Leonard, Sarah Leonard   Account Number:  [id number]  Date Initiated:  11/28/2014  Documentation initiated by:  Tomi Bamberger  Subjective/Objective Assessment:   dx copd ex, dm  admit- from [facility]     Action/Plan:   pt eval- no pt f/u   Anticipated DC Date:  11/29/2014   Anticipated DC Plan:  ASSISTED LIVING / REST HOME  In-house referral  Clinical Social Worker      DC Forensic scientist  CM consult      [facility] Choice  DURABLE MEDICAL EQUIPMENT   Choice offered to / List presented to:  C-1 Patient   DME arranged  [facility]      DME agency  [facility].        Status of service:  Completed, signed off Medicare Important Message given?  NO (If response is "NO", the following Medicare IM given date fields will be blank) Date Medicare IM given:   Medicare IM given by:   Date Additional Medicare IM given:   Additional Medicare IM given by:    Discharge Disposition:  ASSISTED LIVING  Per UR Regulation:  Reviewed for med. necessity/level of care/duration of stay  If discussed at [place] of Stay Meetings, dates discussed:    Comments:  11/29/14 [place], BSN [phone] patient is for dc today back to [facility] with home oxygen, patient chose [facility] referral made to [place] with [facility].  11/28/14 [place], BSN [phone] patient is from ALF, await pt eval.

## 2014-11-29 DIAGNOSIS — Z91148 Patient's other noncompliance with medication regimen for other reason: Secondary | ICD-10-CM

## 2014-11-29 DIAGNOSIS — Z720 Tobacco use: Secondary | ICD-10-CM | POA: Diagnosis present

## 2014-11-29 DIAGNOSIS — E1165 Type 2 diabetes mellitus with hyperglycemia: Secondary | ICD-10-CM | POA: Diagnosis present

## 2014-11-29 DIAGNOSIS — Z9114 Patient's other noncompliance with medication regimen: Secondary | ICD-10-CM

## 2014-11-29 DIAGNOSIS — J9601 Acute respiratory failure with hypoxia: Secondary | ICD-10-CM | POA: Diagnosis present

## 2014-11-29 DIAGNOSIS — E669 Obesity, unspecified: Secondary | ICD-10-CM | POA: Diagnosis present

## 2014-11-29 LAB — GLUCOSE, CAPILLARY
GLUCOSE-CAPILLARY: 236 mg/dL — AB (ref 70–99)
GLUCOSE-CAPILLARY: 240 mg/dL — AB (ref 70–99)
GLUCOSE-CAPILLARY: 352 mg/dL — AB (ref 70–99)
Glucose-Capillary: 428 mg/dL — ABNORMAL HIGH (ref 70–99)

## 2014-11-29 MED ORDER — METFORMIN HCL 1000 MG PO TABS: 500.0000 mg | ORAL_TABLET | Freq: Two times a day (BID) | ORAL | Status: DC

## 2014-11-29 MED ORDER — INSULIN GLARGINE 100 UNIT/ML SOLOSTAR PEN: 32.0000 [IU] | PEN_INJECTOR | Freq: Every day | SUBCUTANEOUS | Status: DC

## 2014-11-29 MED ORDER — NICOTINE 14 MG/24HR TD PT24: 14.0000 mg | MEDICATED_PATCH | Freq: Every day | TRANSDERMAL | Status: DC

## 2014-11-29 MED ORDER — PREDNISONE 50 MG PO TABS: 50.0000 mg | ORAL_TABLET | Freq: Every day | ORAL | Status: AC

## 2014-11-29 MED ORDER — LORAZEPAM 0.5 MG PO TABS: 0.5000 mg | ORAL_TABLET | Freq: Three times a day (TID) | ORAL | Status: DC | PRN

## 2014-11-29 MED ORDER — METFORMIN HCL 1000 MG PO TABS: 1000.0000 mg | ORAL_TABLET | Freq: Two times a day (BID) | ORAL | Status: DC

## 2014-11-29 NOTE — Discharge Summary (Addendum)
Physician Discharge Summary  Sarah Leonard IFO:[id number] DOB: [birth year] DOA: 11/24/2014  PCP: Moreen Fowler  Admit date: 11/24/2014 Discharge date: 11/29/2014  Time spent: 35 minutes  Recommendations for Outpatient Follow-up:  1. Discharged to [facility]. F/up with PCP in 1 week 2. Patient has uncontrolled diabetes , insulin dose increased and needs close monitoring  and counseling on diet and medication compliance. 3. She is being discharged on home O2 4. Pt on high dose seroquel at home pls adjust dose and monitor qtc as outpt  Discharge Diagnoses:  Principal Problem:   Acute respiratory failure with hypoxia  Active Problems:   Diabetes mellitus type 2, uncontrolled   Lactic acidosis   Hypertension   COPD with acute exacerbation   Hyperglycemia due to type 2 diabetes mellitus   Obesity   Tobacco abuse   Noncompliance with medications   Discharge Condition: fair  Diet recommendation: Diabetic  Filed Weights   11/25/14 0411 11/26/14 1330  Weight: 104.7 kg (230 lb 13.2 oz) 110.678 kg (244 lb)    History of present illness:   54 y.o. female and history of COPD, uncontrolled diabetes mellitus type 2, ongoing tobacco use, chronic pain, bipolar disorder was brought to the ER patient was having increasing shortness of breath. Patient is in assisted living facility and states that she has been having increasing productive cough with wheezing over the last 1 week. Patient states she was given a tapering dose of steroid and antibiotic despite which patient is still short of breath. In the ER chest x-ray was unremarkable but patient on exam was having bilateral expiratory wheeze and productive cough. Patient also has some pleuritic type of chest pain on deep inspiration. Patient had low-grade fever in the ED and was tachycardic elevated lactic acid and elevated blood sugar. Patient admitted for acute respiratory failure secondary to COPD exacerbation and  uncontrolled diabetes mellitus.    Hospital Course:  Acute hypoxic respiratory failure secondary to COPD exacerbation Patient placed on IV Solu-Medrol, scheduled nebs and empiric antibiotic. Her symptoms have improved. On evaluation by physical therapy today patient desaturates to 80s on room air and ambulation and will need o2 ,2 L via nasal cannula continuously. Patient will be discharged on 5 more days of oral prednisone 50 mg daily. She has completed 5 day course of antibiotics. Symptoms are much better and patient encouraged to continue home inhalers. He should counseled on smoking cessation and prescribed nicotin patch  Lactic acidosis Possibly secondary to ? Early sepsis. Patient also on metformin which was held. 1/2 blood cx positive on admission which is likely a contaminant. Lactic acid subsequently resolved.  Uncontrolled type 2 diabetes mellitus Patient noncompliant with her insulin use. Patient placed on Lantus while in the hospital. A1c of 12.2. Patient counseled on diet adherence and compliance with insulin. I will increase her metformin 2000 mg twice a day and increase Lantus 32 units daily and she'll be monitored closely as outpatient.   Tobacco abuse Counseled strongly ounces patient. Nicotine patch prescribed.  Essential hypertension Continue home medications  Obesity counseled on wt loss and exercise  Bipolar disorder On high-dose Lamictal and Seroquel as outpatient. Please adjust dose as appropriate  Diet: Diabetic  CODE STATUS: Full code   Procedures:  none  Consultations:  none  Discharge Exam: Filed Vitals:   11/29/14 0524  BP: 115/69  Pulse: 95  Temp: 97.8 F (36.6 C)  Resp: 18    General: Middle aged obese female in no acute  distress HEENT: No pallor, moist oral mucosa, supple neck Chest: Clear to auscultation bilaterally, no added sounds CVS: Normal S1 and S2, no murmurs rub or gallop GI: Soft, nondistended, nontender, bowel sounds  present Musculoskeletal: Warm, no edema CNS: Alert and oriented   Discharge Instructions    Current Discharge Medication List    START taking these medications   Details  Insulin Glargine (LANTUS SOLOSTAR) 100 UNIT/ML Solostar Pen Inject 32 Units into the skin daily at 10 pm. Qty: 15 mL, Refills: 5    nicotine (NICODERM CQ) 14 mg/24hr patch Place 1 patch (14 mg total) onto the skin daily. Qty: 28 patch, Refills: 0      CONTINUE these medications which have CHANGED   Details  LORazepam (ATIVAN) 0.5 MG tablet Take 1 tablet (0.5 mg total) by mouth every 8 (eight) hours as needed for anxiety. Qty: 30 tablet, Refills: 0    metFORMIN (GLUCOPHAGE) 1000 MG tablet Take 1 tablets (1000 mg total) by mouth 2 (two) times daily with a meal. Qty: 60 tablet, Refills: 0    predniSONE (DELTASONE) 50 MG tablet Take 1 tablet (50 mg total) by mouth daily with breakfast. Qty: 5 tablet, Refills: 0      CONTINUE these medications which have NOT CHANGED   Details  albuterol (PROVENTIL HFA;VENTOLIN HFA) 108 (90 BASE) MCG/ACT inhaler Inhale 2 puffs into the lungs every 4 (four) hours as needed for wheezing or shortness of breath (With spacer). For shortness of breath Qty: 1 Inhaler, Refills: 0    aspirin EC 81 MG tablet Take 81 mg by mouth daily.    Budesonide-Formoterol Fumarate (SYMBICORT IN) Inhale 2 puffs into the lungs 2 (two) times daily.    calcium carbonate (OS-CAL - DOSED IN MG OF ELEMENTAL CALCIUM) 1250 MG tablet Take 1 tablet by mouth 2 (two) times daily.    cholecalciferol (VITAMIN D) 1000 UNITS tablet Take 1,000 Units by mouth daily.    docusate sodium (COLACE) 100 MG capsule Take 100 mg by mouth 2 (two) times daily.    DULoxetine (CYMBALTA) 60 MG capsule Take 120 mg by mouth daily.     gabapentin (NEURONTIN) 600 MG tablet Take 600 mg by mouth 3 (three) times daily.    guaifenesin (ROBITUSSIN) 100 MG/5ML syrup Take 200 mg by mouth every 4 (four) hours as needed for cough.     HYDROcodone-acetaminophen (NORCO/VICODIN) 5-325 MG per tablet Take 1 tablet by mouth every 6 (six) hours as needed for pain.    !! lamoTRIgine (LAMICTAL) 100 MG tablet Take 100 mg by mouth 2 (two) times daily. Takes with 25 mg tab    !! lamoTRIgine (LAMICTAL) 25 MG tablet Take 25 mg by mouth 2 (two) times daily. Takes with '100mg'$  tablet    lisinopril (PRINIVIL,ZESTRIL) 5 MG tablet Take 5 mg by mouth daily.    loperamide (IMODIUM) 2 MG capsule Take 2-4 mg by mouth every 3 (three) hours as needed for diarrhea or loose stools.    niacin (NIASPAN) 500 MG CR tablet Take 750 mg by mouth at bedtime.     omeprazole (PRILOSEC) 20 MG capsule Take 20 mg by mouth daily before supper.     promethazine (PHENERGAN) 12.5 MG tablet Take 12.5 mg by mouth every 6 (six) hours as needed for nausea or vomiting.    !! QUEtiapine (SEROQUEL XR) 300 MG 24 hr tablet Take 600 mg by mouth every evening.    !! QUEtiapine (SEROQUEL XR) 50 MG TB24 24 hr tablet Take 100 mg by  mouth daily. Take with '600mg'$  to equal '700mg'$  daily dose    simvastatin (ZOCOR) 40 MG tablet Take 40 mg by mouth daily.    tiotropium (SPIRIVA) 18 MCG inhalation capsule Place 18 mcg into inhaler and inhale daily.    zolpidem (AMBIEN) 10 MG tablet Take 5 mg by mouth at bedtime.     acetaminophen (TYLENOL) 325 MG tablet Take 325 mg by mouth 3 (three) times daily. Prior to fish oil    Fluticasone-Salmeterol (ADVAIR) 250-50 MCG/DOSE AEPB Inhale 1 puff into the lungs every 12 (twelve) hours.     !! - Potential duplicate medications found. Please discuss with provider.    STOP taking these medications     azithromycin (ZITHROMAX Z-PAK) 250 MG tablet        No Known Allergies Follow-up Information    Follow up with Moreen Fowler. Schedule an appointment as soon as possible for a visit in 1 week.   Specialty:  Family Medicine   Contact information:   [place] [address] [phone]        The results of significant  diagnostics from this hospitalization (including imaging, microbiology, ancillary and laboratory) are listed below for reference.    Significant Diagnostic Studies: Dg Chest 2 View  11/20/2014   CLINICAL DATA:  Cough, chest pain.  EXAM: CHEST  2 VIEW  COMPARISON:  March 10, 2013.  FINDINGS: The heart size and mediastinal contours are within normal limits. No pneumothorax or pleural effusion is noted. Stable diffuse interstitial densities are noted throughout both lungs most consistent with chronic scarring or interstitial lung disease. Superimposed acute pulmonary edema cannot be excluded. Anterior osteophyte formation is noted in lower thoracic spine.  IMPRESSION: Stable diffuse interstitial densities are noted throughout both lungs consistent with chronic scarring or interstitial lung disease. Superimposed acute pulmonary edema cannot be excluded.   Electronically Signed   By: Marijo Conception, M.D.   On: 11/20/2014 13:00   Ct Head Wo Contrast  11/25/2014   CLINICAL DATA:  Diffuse headache for 4 days. Dizziness for 2-3 days.  EXAM: CT HEAD WITHOUT CONTRAST  TECHNIQUE: Contiguous axial images were obtained from the base of the skull through the vertex without intravenous contrast.  COMPARISON:  07/24/2010  FINDINGS: No intracranial hemorrhage, mass effect, or midline shift. No hydrocephalus. The basilar cisterns are patent. No evidence of territorial infarct. No intracranial fluid collection. Calvarium is intact. There is diffuse mucosal thickening of both maxillary sinuses, ethmoid air cells, and right frontal sinus. Minimal opacification of lower left mastoid air cells.  IMPRESSION: 1.  No acute intracranial abnormality. 2. Sinusitis.   Electronically Signed   By: Jeb Levering M.D.   On: 11/25/2014 02:34   Ct Angio Chest Pe W/cm &/or Wo Cm  11/25/2014   CLINICAL DATA:  54 year old female with a history of COPD, increasing shortness of breath.  EXAM: CT ANGIOGRAPHY CHEST WITH CONTRAST  TECHNIQUE:  Multidetector CT imaging of the chest was performed using the standard protocol during bolus administration of intravenous contrast. Multiplanar CT image reconstructions and MIPs were obtained to evaluate the vascular anatomy.  CONTRAST:  75m OMNIPAQUE IOHEXOL 350 MG/ML SOLN  COMPARISON:  Chest x-ray 11/24/2014, 11/20/2014. CT of the abdomen and pelvis 03/10/2013 07/26/2012. Chest CT 06/27/2004  FINDINGS: Chest:  Unremarkable soft tissues of the chest wall.  No axillary or supraclavicular adenopathy.  Unremarkable appearance of the thoracic inlet. Unremarkable thyroid.  Unremarkable appearance of the trachea and central airways with no debris or significant  thickening.  Multiple mediastinal lymph nodes, suspicious by number though not size. Lymph nodes predominantly involves the lower paratracheal nodal stations, subcarinal nodal stations, and the hilar regions.  Heart size within normal limits. No pericardial fluid/ thickening. New mitral annular calcifications with scattered calcifications of the aortic valve. Calcifications of the left anterior descending coronary artery. Calcifications of the right coronary artery.  Thoracic aorta unremarkable in course caliber and contour. No aneurysm or dissection flap. Scattered calcifications. Atherosclerotic changes at the branch vessels, which appear patent.  Unremarkable diameter of the main pulmonary artery. No central or lobar filling defects. Beyond this evaluation of the pulmonary arterial tree is limited by the timing of the contrast bolus.  Multiple foci of mixed airspace and interstitial opacities throughout all lobes of the bilateral lungs. The appearance is significantly improved from the only chest CT comparison dated 06/27/2004. The lower lungs appear to be slightly worse than the most recent comparison abdominal CT.  No pleural fluid. Respiratory motion somewhat limits evaluation of the lungs, though there is mild airway thickening present.  Upper abdomen:   Steatosis, with otherwise unremarkable appearance of the upper abdomen.  Musculoskeletal:  No displaced fracture. Anterior osteophyte formation of the thoracic spine.  Review of the MIP images confirms the above findings.  IMPRESSION: Multiple focal regions of mixed interstitial and airspace disease throughout the bilateral lungs. This is significantly improved from the only comparison chest CT dated 2005, with slight worsening of the lower lungs from the abdominal CT 03/10/2013. Findings are compatible with chronic interstitial disease/scarring, though acute overlying pneumonia is possible.  Multiple borderline lymph nodes of the mediastinum, likely reactive.  Mild atherosclerosis with single vessel coronary artery disease.  Calcifications of the aortic valve and mitral annulus. If there is concern for aortic valve dysfunction, correlation with echocardiogram may be useful.  Steatosis  Signed,  Dulcy Fanny. Earleen Newport, DO  [facility]  [facility]   Electronically Signed   By: Corrie Mckusick D.O.   On: 11/25/2014 08:52   US Abdomen Complete  11/25/2014   CLINICAL DATA:  Abdominal pain  EXAM: ULTRASOUND ABDOMEN COMPLETE  COMPARISON:  03/10/2013  FINDINGS: Gallbladder: Surgically removed  Common bile duct: Diameter: 4.1 mm.  Liver: Heterogeneity is identified although no focal mass lesion is seen.  IVC: No abnormality visualized.  Pancreas: Not well visualized due to overlying bowel gas.  Spleen: Size and appearance within normal limits.  Right Kidney: Length: 13.1 cm. Echogenicity within normal limits. No mass or hydronephrosis visualized.  Left Kidney: Length: 14.5 cm. 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 Mild fullness is noted in the inferior collecting system similar to that seen on prior CT examination.  Abdominal aorta: No aneurysm visualized.  Other findings: None.  IMPRESSION: Status post cholecystectomy.   No acute abnormality is identified.   Electronically Signed   By: Inez Catalina M.D.   On: 11/25/2014 09:30   Dg Chest Port 1 View  11/24/2014   CLINICAL DATA:  Bronchitis.  EXAM: PORTABLE CHEST - 1 VIEW  COMPARISON:  11/20/2014  FINDINGS: There is chronic marked interstitial coarsening, especially in the lower lungs. There is no definitive change when accounting for differences in technique. The lungs are chronically hyperinflated. No edema, effusion, or pneumothorax. Normal heart size and aortic contours.  IMPRESSION: Chronic lung disease. There is no definitive change from 11/20/2014, but note that chronic opacity could obscure pneumonia at the bases.  Electronically Signed   By: Monte Fantasia M.D.   On: 11/24/2014 23:31    Microbiology: Recent Results (from the past 240 hour(s))  Culture, blood (routine x 2)     Status: None   Collection Time: 11/24/14 11:45 PM  Result Value Ref Range Status   Specimen Description BLOOD RIGHT HAND  Final   Special Requests BOTTLES DRAWN AEROBIC AND ANAEROBIC 5CC  Final   Culture   Final    STAPHYLOCOCCUS SPECIES (COAGULASE NEGATIVE) Note: THE SIGNIFICANCE OF ISOLATING THIS ORGANISM FROM A SINGLE SET OF BLOOD CULTURES WHEN MULTIPLE SETS ARE DRAWN IS UNCERTAIN. PLEASE NOTIFY THE MICROBIOLOGY DEPARTMENT WITHIN ONE WEEK IF SPECIATION AND SENSITIVITIES ARE REQUIRED. Note: Gram Stain Report Called to,Read Back By and Verified With: GRETA S 11/26/14 1340 BY SMITHERSJ Performed at [facility]    Report Status 11/27/2014 FINAL  Final  Culture, blood (routine x 2)     Status: None (Preliminary result)   Collection Time: 11/25/14 12:32 AM  Result Value Ref Range Status   Specimen Description BLOOD LEFT HAND  Final   Special Requests BOTTLES DRAWN AEROBIC AND ANAEROBIC 5CC  Final   Culture   Final           BLOOD CULTURE RECEIVED NO GROWTH TO DATE CULTURE WILL BE HELD FOR 5 DAYS BEFORE ISSUING A FINAL NEGATIVE REPORT Performed at [facility]     Report Status PENDING  Incomplete  MRSA PCR Screening     Status: None   Collection Time: 11/25/14  2:46 AM  Result Value Ref Range Status   MRSA by PCR NEGATIVE NEGATIVE Final    Comment:        The GeneXpert MRSA Assay (FDA approved for NASAL specimens only), is one component of a comprehensive MRSA colonization surveillance program. It is not intended to diagnose MRSA infection nor to guide or monitor treatment for MRSA infections.      Labs: Basic Metabolic Panel:  Recent Labs Lab 11/24/14 2345 11/25/14 0300 11/26/14 0323  NA 133*  --  132*  K 4.2  --  4.3  CL 93*  --  98  CO2 25  --  22  GLUCOSE 513*  --  387*  BUN 12  --  10  CREATININE 0.62 0.55 0.53  CALCIUM 10.3  --  8.8   Liver Function Tests:  Recent Labs Lab 11/24/14 2345  AST 28  ALT 21  ALKPHOS 159*  BILITOT 0.6  PROT 6.9  ALBUMIN 3.2*    Recent Labs Lab 11/25/14 1140  LIPASE 28   No results for input(s): AMMONIA in the last 168 hours. CBC:  Recent Labs Lab 11/24/14 2345 11/25/14 0300 11/26/14 0323 11/27/14 0740  WBC 14.6* 12.2* 11.0* 10.7*  NEUTROABS 9.9*  --   --   --   HGB 13.4 12.9 12.8 12.7  HCT 41.8 39.0 38.8 38.8  MCV 95.2 94.4 94.6 95.8  PLT 295 238 258 299   Cardiac Enzymes:  Recent Labs Lab 11/25/14 0300 11/25/14 1140  TROPONINI 0.03 <0.03   BNP: BNP (last 3 results) No results for input(s): BNP in the last 8760 hours.  ProBNP (last 3 results) No results for input(s): PROBNP in the last 8760 hours.  CBG:  Recent Labs Lab 11/28/14 1202 11/28/14 1722 11/28/14 2210 11/29/14 0654 11/29/14 0758  GLUCAP 421* 272* 294* 236* 240*       Signed:  Kippy Gohman  [facility] 11/29/2014, 11:13 AM

## 2014-11-29 NOTE — Progress Notes (Signed)
MD Dhungel paged patient blood sugar 426, asked for intervention.

## 2014-11-29 NOTE — Discharge Instructions (Signed)
Diabetes Mellitus and Food It is important for you to manage your blood sugar (glucose) level. Your blood glucose level can be greatly affected by what you eat. Eating healthier foods in the appropriate amounts throughout the day at about the same time each day will help you control your blood glucose level. It can also help slow or prevent worsening of your diabetes mellitus. Healthy eating may even help you improve the level of your blood pressure and reach or maintain a healthy weight.  HOW CAN FOOD AFFECT ME? Carbohydrates Carbohydrates affect your blood glucose level more than any other type of food. Your dietitian will help you determine how many carbohydrates to eat at each meal and teach you how to count carbohydrates. Counting carbohydrates is important to keep your blood glucose at a healthy level, especially if you are using insulin or taking certain medicines for diabetes mellitus. Alcohol Alcohol can cause sudden decreases in blood glucose (hypoglycemia), especially if you use insulin or take certain medicines for diabetes mellitus. Hypoglycemia can be a life-threatening condition. Symptoms of hypoglycemia (sleepiness, dizziness, and disorientation) are similar to symptoms of having too much alcohol.  If your health care provider has given you approval to drink alcohol, do so in moderation and use the following guidelines:  Women should not have more than one drink per day, and men should not have more than two drinks per day. One drink is equal to:  12 oz of beer.  5 oz of wine.  1 oz of hard liquor.  Do not drink on an empty stomach.  Keep yourself hydrated. Have water, diet soda, or unsweetened iced tea.  Regular soda, juice, and other mixers might contain a lot of carbohydrates and should be counted. WHAT FOODS ARE NOT RECOMMENDED? As you make food choices, it is important to remember that all foods are not the same. Some foods have fewer nutrients per serving than other  foods, even though they might have the same number of calories or carbohydrates. It is difficult to get your body what it needs when you eat foods with fewer nutrients. Examples of foods that you should avoid that are high in calories and carbohydrates but low in nutrients include:  Trans fats (most processed foods list trans fats on the Nutrition Facts label).  Regular soda.  Juice.  Candy.  Sweets, such as cake, pie, doughnuts, and cookies.  Fried foods. WHAT FOODS CAN I EAT? Have nutrient-rich foods, which will nourish your body and keep you healthy. The food you should eat also will depend on several factors, including:  The calories you need.  The medicines you take.  Your weight.  Your blood glucose level.  Your blood pressure level.  Your cholesterol level. You also should eat a variety of foods, including:  Protein, such as meat, poultry, fish, tofu, nuts, and seeds (lean animal proteins are best).  Fruits.  Vegetables.  Dairy products, such as milk, cheese, and yogurt (low fat is best).  Breads, grains, pasta, cereal, rice, and beans.  Fats such as olive oil, trans fat-free margarine, canola oil, avocado, and olives. DOES EVERYONE WITH DIABETES MELLITUS HAVE THE SAME MEAL PLAN? Because every person with diabetes mellitus is different, there is not one meal plan that works for everyone. It is very important that you meet with a dietitian who will help you create a meal plan that is just right for you. Document Released: 04/24/2005 Document Revised: 08/02/2013 Document Reviewed: 06/24/2013 [employer] Patient Information 2015 [employer]. This   information is not intended to replace advice given to you by your health care provider. Make sure you discuss any questions you have with your health care provider.  

## 2014-11-29 NOTE — Evaluation (Signed)
Physical Therapy Evaluation Patient Details Name: Sarah Leonard MRN: [id number] DOB: [birth year] Today's Date: 11/29/2014   History of Present Illness  Sarah Leonard is a 54 y.o. female and history of COPD, diabetes mellitus type 2, chronic pain, bipolar disorder was brought to the ER patient was having increasing shortness of breath. Patient is in assisted living facility and states that she has been having increasing productive cough with wheezing over the last 1 week. Patient states she was given a tapering dose of steroid and antibiotic despite which patient is still short of breath. In the ER chest x-ray did not show anything acute but patient on exam was having bilateral expiratory wheeze and productive cough. Patient also has some pleuritic type of chest pain on deep inspiration. Patient edition states she is getting nauseated and had some abdominal discomfort was in the right upper quadrant. Denies any fever chills the patient was mildly febrile in the ER. In addition patient is tachycardic and lactic acid was elevated. Patient's blood sugar was markedly elevated. Patient's blood sugar improved with fluid bolus. Patient has been admitted for acute respiratory failure probably from COPD exacerbation and uncontrolled diabetes mellitus type 2.  Clinical Impression  Pt admitted with/for s/s of COPD exacerbation with PNA.  Pt currently limited functionally due to the problems listed below.  (see problems list.)  Pt will benefit from PT to maximize function and safety to be able to get home safely with available assist.     Follow Up Recommendations No PT follow up    Equipment Recommendations  None recommended by PT    Recommendations for Other Services       Precautions / Restrictions Precautions Precautions: None Restrictions Weight Bearing Restrictions: No      Mobility  Bed Mobility Overal bed mobility: Independent                Transfers Overall transfer level:  Independent                  Ambulation/Gait Ambulation/Gait assistance: Supervision Ambulation Distance (Feet): 200 Feet (then addition 120 feet ) Assistive device: None Gait Pattern/deviations: WFL(Within Functional Limits) Gait velocity: WFL, but slow preferred to conserve energy.      Stairs            Wheelchair Mobility    Modified Rankin (Stroke Patients Only)       Balance Overall balance assessment: No apparent balance deficits (not formally assessed)                                           Pertinent Vitals/Pain Pain Assessment: No/denies pain    Home Living Family/patient expects to be discharged to:: Assisted living               Home Equipment: None      Prior Function Level of Independence: Independent               Hand Dominance        Extremity/Trunk Assessment   Upper Extremity Assessment: Overall WFL for tasks assessed           Lower Extremity Assessment: Overall WFL for tasks assessed      Cervical / Trunk Assessment: Normal  Communication   Communication: No difficulties  Cognition Arousal/Alertness: Awake/alert Behavior During Therapy: WFL for tasks assessed/performed Overall Cognitive Status: Within Functional Limits for  tasks assessed                      General Comments General comments (skin integrity, edema, etc.): At rest on 2L before/after SpO2 around 91% and HR aroune 100 bpm.  On RA with gait sats dropped to 83/84% and compensatory EHR 116 bpm.  Oxygen applied at 2L Sarah Leonard and sat rose slightly to 90% during amb. Pt  symptomatic at less than 90%    Exercises        Assessment/Plan    PT Assessment Patient needs continued PT services  PT Diagnosis Other (comment) (decrease activity tolerance)   PT Problem List Decreased activity tolerance;Cardiopulmonary status limiting activity  PT Treatment Interventions Gait training;DME instruction;Functional mobility  training;Therapeutic activities   PT Goals (Current goals can be found in the Care Plan section) Acute Rehab PT Goals Patient Stated Goal: my breath back PT Goal Formulation: With patient Time For Goal Achievement: 12/06/14 Potential to Achieve Goals: Good    Frequency Min 2X/week   Barriers to discharge        Co-evaluation               End of Session Equipment Utilized During Treatment: Oxygen Activity Tolerance: Patient tolerated treatment well Patient left: Other (comment) (sitting EOB) Nurse Communication: Mobility status         Time: 2876-8115 PT Time Calculation (min) (ACUTE ONLY): 24 min   Charges:   PT Evaluation $Initial PT Evaluation Tier I: 1 Procedure PT Treatments $Gait Training: 8-22 mins   PT G Codes:        Kanylah Muench, Tessie Fass 11/29/2014, 11:46 AM 11/29/2014  Donnella Sham, [place] [phone]  (pager)

## 2014-11-29 NOTE — Progress Notes (Addendum)
NURSING PROGRESS NOTE  Sarah Leonard [id number] Discharge Data: 11/29/2014 4:55 PM Attending Provider: Louellen Molder, MD [id number], [facility] [place] to be D/C'd Skilled nursing facility per MD order. All d/c paperwork given to pateitn including hard copies of all prescriptions as well as home O2. Patient dc'd to sister via car whom stated she will take her back to the facility. Patient blood sugar of 352 via phone, patient given 20 units of her schedule sliding scale insulin, MD Dhungel aware via phone, and ok with discharge.    All IV's will be discontinued and monitored for bleeding.  All belongings will be returned to patient for patient to take home.  Last Documented Vital Signs:  Blood pressure 147/89, pulse 98, temperature 99.3 F (37.4 C), temperature source Oral, resp. rate 20, height '5\' 3"'$  (1.6 m), weight 110.678 kg (244 lb), SpO2 92 %.  Hendricks Limes RN, BS, BSN

## 2014-11-29 NOTE — Progress Notes (Signed)
Inpatient Diabetes Program Recommendations  AACE/ADA: New Consensus Statement on Inpatient Glycemic Control  Target Ranges:  Prepandial:   less than 140 mg/dL      Peak postprandial:   less than 180 mg/dL (1-2 hours)      Critically ill patients:  140 - 180 mg/dL  Pager:  [phone]    Reason for Visit: Elevated glucose in 200s   Inpatient Diabetes Program Recommendations  Insulin - Meal Coverage: Please add 4 units TID Novolog for meal coverage.   Courtney Heys PhD, RN, BC-ADM Diabetes Coordinator  Office:  [phone] Team Pager:  [phone]

## 2014-11-29 NOTE — Progress Notes (Signed)
SATURATION QUALIFICATIONS: (This note is used to comply with regulatory documentation for home oxygen)  Patient Saturations on Room Air at Rest = 88%  Patient Saturations on Room Air while Ambulating = 83%  Patient Saturations on 2 Liters of oxygen while Ambulating = 90%  Please briefly explain why patient needs home oxygen:Supplemental oxygen helped bring saturations up to an adequate level while ambulating. 11/29/2014  Donnella Sham, [place] [phone]  (pager)

## 2014-11-29 NOTE — Clinical Social Work Note (Signed)
Clinical Social Worker facilitated patient discharge including contacting patient family and facility to confirm patient discharge plans.  Clinical information faxed to facility and family agreeable with plan.  CSW arranged transport with patient sister to [facility].  RN to call report prior to discharge.  Clinical Social Worker will sign off for now as social work intervention is no longer needed. Please consult [place] again if new need arises.  Barbette Or, [place]

## 2014-12-01 LAB — CULTURE, BLOOD (ROUTINE X 2): CULTURE: NO GROWTH

## 2015-05-20 ENCOUNTER — Encounter (HOSPITAL_COMMUNITY): Payer: Self-pay | Admitting: Emergency Medicine

## 2015-05-20 ENCOUNTER — Emergency Department (HOSPITAL_COMMUNITY): Payer: Medicaid Other

## 2015-05-20 ENCOUNTER — Emergency Department (HOSPITAL_COMMUNITY)
Admission: EM | Admit: 2015-05-20 | Discharge: 2015-05-20 | Discharge status: Against Medical Advice | Payer: Medicaid Other | Attending: Emergency Medicine | Admitting: Emergency Medicine

## 2015-05-20 DIAGNOSIS — Z79899 Other long term (current) drug therapy: Secondary | ICD-10-CM | POA: Insufficient documentation

## 2015-05-20 DIAGNOSIS — J159 Unspecified bacterial pneumonia: Secondary | ICD-10-CM | POA: Insufficient documentation

## 2015-05-20 DIAGNOSIS — Z720 Tobacco use: Secondary | ICD-10-CM | POA: Diagnosis not present

## 2015-05-20 DIAGNOSIS — J441 Chronic obstructive pulmonary disease with (acute) exacerbation: Secondary | ICD-10-CM | POA: Insufficient documentation

## 2015-05-20 DIAGNOSIS — Z872 Personal history of diseases of the skin and subcutaneous tissue: Secondary | ICD-10-CM | POA: Diagnosis not present

## 2015-05-20 DIAGNOSIS — E119 Type 2 diabetes mellitus without complications: Secondary | ICD-10-CM | POA: Diagnosis not present

## 2015-05-20 DIAGNOSIS — R058 Other specified cough: Secondary | ICD-10-CM

## 2015-05-20 DIAGNOSIS — Z7982 Long term (current) use of aspirin: Secondary | ICD-10-CM | POA: Diagnosis not present

## 2015-05-20 DIAGNOSIS — R7989 Other specified abnormal findings of blood chemistry: Secondary | ICD-10-CM

## 2015-05-20 DIAGNOSIS — Z87448 Personal history of other diseases of urinary system: Secondary | ICD-10-CM | POA: Insufficient documentation

## 2015-05-20 DIAGNOSIS — R05 Cough: Secondary | ICD-10-CM

## 2015-05-20 DIAGNOSIS — R740 Nonspecific elevation of levels of transaminase and lactic acid dehydrogenase [LDH]: Secondary | ICD-10-CM | POA: Diagnosis not present

## 2015-05-20 DIAGNOSIS — Z8719 Personal history of other diseases of the digestive system: Secondary | ICD-10-CM | POA: Insufficient documentation

## 2015-05-20 DIAGNOSIS — Z8619 Personal history of other infectious and parasitic diseases: Secondary | ICD-10-CM | POA: Insufficient documentation

## 2015-05-20 DIAGNOSIS — J189 Pneumonia, unspecified organism: Secondary | ICD-10-CM

## 2015-05-20 DIAGNOSIS — Z862 Personal history of diseases of the blood and blood-forming organs and certain disorders involving the immune mechanism: Secondary | ICD-10-CM | POA: Insufficient documentation

## 2015-05-20 DIAGNOSIS — F319 Bipolar disorder, unspecified: Secondary | ICD-10-CM | POA: Insufficient documentation

## 2015-05-20 DIAGNOSIS — R0989 Other specified symptoms and signs involving the circulatory and respiratory systems: Secondary | ICD-10-CM

## 2015-05-20 DIAGNOSIS — E871 Hypo-osmolality and hyponatremia: Secondary | ICD-10-CM | POA: Diagnosis not present

## 2015-05-20 DIAGNOSIS — G8929 Other chronic pain: Secondary | ICD-10-CM | POA: Insufficient documentation

## 2015-05-20 LAB — BASIC METABOLIC PANEL
Anion gap: 14 (ref 5–15)
CO2: 25 mmol/L (ref 22–32)
Calcium: 9.8 mg/dL (ref 8.9–10.3)
Chloride: 84 mmol/L — ABNORMAL LOW (ref 101–111)
Creatinine, Ser: 0.43 mg/dL — ABNORMAL LOW (ref 0.44–1.00)
GFR calc Af Amer: >60 mL/min (ref >60)
GLUCOSE: 189 mg/dL — AB (ref 65–99)
Potassium: 4.3 mmol/L (ref 3.5–5.1)
Sodium: 123 mmol/L — ABNORMAL LOW (ref 135–145)

## 2015-05-20 LAB — CBC WITH DIFFERENTIAL/PLATELET
BASOS ABS: 0 10*3/uL (ref 0.0–0.1)
Basophils Relative: 0 %
EOS PCT: 1 %
Eosinophils Absolute: 0.1 10*3/uL (ref 0.0–0.7)
HEMATOCRIT: 34.6 % — AB (ref 36.0–46.0)
Hemoglobin: 12.2 g/dL (ref 12.0–15.0)
LYMPHS ABS: 2.4 10*3/uL (ref 0.7–4.0)
Lymphocytes Relative: 23 %
MCH: 30.8 pg (ref 26.0–34.0)
MCHC: 35.3 g/dL (ref 30.0–36.0)
MCV: 87.4 fL (ref 78.0–100.0)
MONO ABS: 0.8 10*3/uL (ref 0.1–1.0)
Monocytes Relative: 8 %
NEUTROS ABS: 6.9 10*3/uL (ref 1.7–7.7)
Neutrophils Relative %: 68 %
PLATELETS: 296 10*3/uL (ref 150–400)
RBC: 3.96 MIL/uL (ref 3.87–5.11)
RDW: 12.6 % (ref 11.5–15.5)
WBC: 10.1 10*3/uL (ref 4.0–10.5)

## 2015-05-20 LAB — I-STAT CG4 LACTIC ACID, ED: Lactic Acid, Venous: 2.09 mmol/L (ref 0.5–2.0)

## 2015-05-20 MED ORDER — METHYLPREDNISOLONE SODIUM SUCC 125 MG IJ SOLR
125.0000 mg | Freq: Once | INTRAMUSCULAR | Status: AC
  Administered 2015-05-20: 125 mg via INTRAVENOUS
  Filled 2015-05-20: qty 2

## 2015-05-20 MED ORDER — IPRATROPIUM-ALBUTEROL 0.5-2.5 (3) MG/3ML IN SOLN
3.0000 mL | Freq: Once | RESPIRATORY_TRACT | Status: AC
  Administered 2015-05-20: 3 mL via RESPIRATORY_TRACT
  Filled 2015-05-20: qty 3

## 2015-05-20 MED ORDER — DEXTROSE 5 % IV SOLN
500.0000 mg | Freq: Once | INTRAVENOUS | Status: AC
  Administered 2015-05-20: 500 mg via INTRAVENOUS
  Filled 2015-05-20: qty 500

## 2015-05-20 MED ORDER — CEFTRIAXONE SODIUM 1 G IJ SOLR
1.0000 g | Freq: Once | INTRAMUSCULAR | Status: AC
  Administered 2015-05-20: 1 g via INTRAVENOUS
  Filled 2015-05-20: qty 10

## 2015-05-20 MED ORDER — DIPHENHYDRAMINE HCL 50 MG/ML IJ SOLN
25.0000 mg | Freq: Once | INTRAMUSCULAR | Status: AC
  Administered 2015-05-20: 25 mg via INTRAVENOUS
  Filled 2015-05-20: qty 1

## 2015-05-20 MED ORDER — SODIUM CHLORIDE 0.9 % IV SOLN
1000.0000 mL | Freq: Once | INTRAVENOUS | Status: AC
  Administered 2015-05-20: 1000 mL via INTRAVENOUS

## 2015-05-20 MED ORDER — METOCLOPRAMIDE HCL 5 MG/ML IJ SOLN
10.0000 mg | Freq: Once | INTRAMUSCULAR | Status: AC
  Administered 2015-05-20: 10 mg via INTRAVENOUS
  Filled 2015-05-20: qty 2

## 2015-05-20 MED ORDER — SODIUM CHLORIDE 0.9 % IV SOLN: 1000.0000 mL | INTRAVENOUS | Status: DC

## 2015-05-20 MED ORDER — ALBUTEROL SULFATE HFA 108 (90 BASE) MCG/ACT IN AERS: 2.0000 | INHALATION_SPRAY | RESPIRATORY_TRACT | Status: AC | PRN

## 2015-05-20 MED ORDER — PREDNISONE 50 MG PO TABS: 50.0000 mg | ORAL_TABLET | Freq: Every day | ORAL | Status: DC

## 2015-05-20 MED ORDER — AMOXICILLIN 500 MG PO CAPS: 1000.0000 mg | ORAL_CAPSULE | Freq: Two times a day (BID) | ORAL | Status: DC

## 2015-05-20 NOTE — ED Provider Notes (Signed)
CSN: [id number]     Arrival date & time 05/20/15  0040 History  By signing my name below, I, Sarah Leonard, attest that this documentation has been prepared under the direction and in the presence of Delora Fuel, MD. Electronically Signed: Irene Leonard, ED Scribe. 05/20/2015.3:37 AM.  Chief Complaint  Patient presents with  . Cough  . Headache   The history is provided by the patient. No language interpreter was used.  HPI Comments: Sarah Leonard is a 54 y.o. Female with a hx of DM, alcohol abuse, respiratory distress, COPD, hypotension, and septic shock brought in by PTAR from [facility] assisted living who presents to the Emergency Department complaining of productive cough, gradually worsening headache, and chest congestion onset 4 days ago. She reports associated chest pain, fatigue, dry heaving, nausea, and myalgias. She states that it hurts to take a breath or to cough. She states that she "needs an inhaler but I don't have one at home." Reports a past hx of hospitalization for respiratory distress. Pt denies trying anything for her headache. She denies fever, chills, or vomiting.   Past Medical History  Diagnosis Date  . Diabetes mellitus   . Pancreatitis   . Anemia   . Bipolar 1 disorder ([place])   . Alcohol abuse   . 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 COPD (chronic obstructive pulmonary disease) ([place])   . Hypotension   . Major depressive disorder ([place])   . Cocaine abuse   . Chronic back pain   . Renal disorder   . Acidosis   . Hyperkalemia   . Abscess   . Acute encephalopathy   . Septic shock [facility])    Past Surgical History  Procedure Laterality Date  . Cholecystectomy    . Appendectomy    . Tonsillectomy    . Incision and drainage abscess Right 03/01/2013    Procedure: INCISION AND DRAINAGE ABSCESS;  Surgeon: Harl Bowie, MD;  Location: [facility];  Service: General;  Laterality: Right;  . Cesarean section     Family History  Problem  Relation Age of Onset  . Diabetes Mother   . Cancer Mother     breast   Social History  Substance Use Topics  . Smoking status: Current Every Day Smoker -- 0.00 packs/day    Types: Cigarettes  . Smokeless tobacco: Never Used  . Alcohol Use: No     Comment: hx of etoh abuse   OB History    No data available     Review of Systems  Constitutional: Positive for fatigue. Negative for fever and chills.  HENT: Positive for congestion.   Respiratory: Positive for cough and wheezing.   Cardiovascular: Positive for chest pain.  Gastrointestinal: Positive for nausea. Negative for vomiting.  Musculoskeletal: Positive for myalgias.  Neurological: Positive for headaches.  All other systems reviewed and are negative.  Allergies  Review of patient's allergies indicates no known allergies.  Home Medications   Prior to Admission medications   Medication Sig Start Date End Date Taking? Authorizing Provider  acetaminophen (TYLENOL) 325 MG tablet Take 325 mg by mouth 3 (three) times daily. Prior to fish oil    Historical Provider, MD  albuterol (PROVENTIL HFA;VENTOLIN HFA) 108 (90 BASE) MCG/ACT inhaler Inhale 2 puffs into the lungs every 4 (four) hours as needed for wheezing or shortness of breath (With spacer). For shortness of breath 08/21/12   Charlotte Sanes, MD  aspirin EC 81 MG  tablet Take 81 mg by mouth daily.    Historical Provider, MD  Budesonide-Formoterol Fumarate (SYMBICORT IN) Inhale 2 puffs into the lungs 2 (two) times daily.    Historical Provider, MD  calcium carbonate (OS-CAL - DOSED IN MG OF ELEMENTAL CALCIUM) 1250 MG tablet Take 1 tablet by mouth 2 (two) times daily.    Historical Provider, MD  cholecalciferol (VITAMIN D) 1000 UNITS tablet Take 1,000 Units by mouth daily.    Historical Provider, MD  docusate sodium (COLACE) 100 MG capsule Take 100 mg by mouth 2 (two) times daily.    Historical Provider, MD  DULoxetine (CYMBALTA) 60 MG capsule Take 120 mg by mouth daily.      Historical Provider, MD  Fluticasone-Salmeterol (ADVAIR) 250-50 MCG/DOSE AEPB Inhale 1 puff into the lungs every 12 (twelve) hours.    Historical Provider, MD  gabapentin (NEURONTIN) 600 MG tablet Take 600 mg by mouth 3 (three) times daily.    Historical Provider, MD  guaifenesin (ROBITUSSIN) 100 MG/5ML syrup Take 200 mg by mouth every 4 (four) hours as needed for cough.    Historical Provider, MD  HYDROcodone-acetaminophen (NORCO/VICODIN) 5-325 MG per tablet Take 1 tablet by mouth every 6 (six) hours as needed for pain.    Historical Provider, MD  Insulin Glargine (LANTUS SOLOSTAR) 100 UNIT/ML Solostar Pen Inject 32 Units into the skin daily at 10 pm. 11/29/14   Nishant Dhungel, MD  lamoTRIgine (LAMICTAL) 100 MG tablet Take 100 mg by mouth 2 (two) times daily. Takes with 25 mg tab    Historical Provider, MD  lamoTRIgine (LAMICTAL) 25 MG tablet Take 25 mg by mouth 2 (two) times daily. Takes with '100mg'$  tablet    Historical Provider, MD  lisinopril (PRINIVIL,ZESTRIL) 5 MG tablet Take 5 mg by mouth daily.    Historical Provider, MD  loperamide (IMODIUM) 2 MG capsule Take 2-4 mg by mouth every 3 (three) hours as needed for diarrhea or loose stools.    Historical Provider, MD  LORazepam (ATIVAN) 0.5 MG tablet Take 1 tablet (0.5 mg total) by mouth every 8 (eight) hours as needed for anxiety. 11/29/14   Nishant Dhungel, MD  metFORMIN (GLUCOPHAGE) 1000 MG tablet Take 1 tablet (1,000 mg total) by mouth 2 (two) times daily with a meal. 11/29/14   Nishant Dhungel, MD  niacin (NIASPAN) 500 MG CR tablet Take 750 mg by mouth at bedtime.     Historical Provider, MD  nicotine (NICODERM CQ) 14 mg/24hr patch Place 1 patch (14 mg total) onto the skin daily. 11/29/14   Nishant Dhungel, MD  omeprazole (PRILOSEC) 20 MG capsule Take 20 mg by mouth daily before supper.     Historical Provider, MD  promethazine (PHENERGAN) 12.5 MG tablet Take 12.5 mg by mouth every 6 (six) hours as needed for nausea or vomiting.    Historical  Provider, MD  QUEtiapine (SEROQUEL XR) 300 MG 24 hr tablet Take 600 mg by mouth every evening.    Historical Provider, MD  QUEtiapine (SEROQUEL XR) 50 MG TB24 24 hr tablet Take 100 mg by mouth daily. Take with '600mg'$  to equal '700mg'$  daily dose    Historical Provider, MD  simvastatin (ZOCOR) 40 MG tablet Take 40 mg by mouth daily.    Historical Provider, MD  tiotropium (SPIRIVA) 18 MCG inhalation capsule Place 18 mcg into inhaler and inhale daily.    Historical Provider, MD  zolpidem (AMBIEN) 10 MG tablet Take 5 mg by mouth at bedtime.     Historical Provider, MD  BP 138/77 mmHg  Pulse 109  Temp(Src) 98.8 F (37.1 C) (Oral)  Resp 22  Ht '5\' 3"'$  (1.6 m)  Wt 234 lb 2 oz (106.198 kg)  BMI 41.48 kg/m2  SpO2 94%  Physical Exam  Constitutional: She is oriented to person, place, and time. She appears well-developed and well-nourished.  Appears dyspneic; unable to complete full sentences in a single breath  HENT:  Head: Normocephalic and atraumatic.  Eyes: EOM are normal. Pupils are equal, round, and reactive to light.  Neck: Normal range of motion. Neck supple. No JVD present.  Cardiovascular: Normal rate, regular rhythm and normal heart sounds.   No murmur heard. Pulmonary/Chest: She has wheezes. She has rales. She exhibits no tenderness.  Bibasilar rales; diffuse expiratory wheezes  Abdominal: Soft. Bowel sounds are normal. She exhibits no distension and no mass. There is no tenderness.  Musculoskeletal: Normal range of motion. She exhibits no edema.  Lymphadenopathy:    She has no cervical adenopathy.  Neurological: She is alert and oriented to person, place, and time. No cranial nerve deficit. She exhibits normal muscle tone. Coordination normal.  Skin: Skin is warm and dry. No rash noted.  Psychiatric: She has a normal mood and affect. Her behavior is normal. Judgment and thought content normal.  Nursing note and vitals reviewed.   ED Course  Procedures (including critical care  time) DIAGNOSTIC STUDIES: Oxygen Saturation is 94% on RA, adequate by my interpretation.    COORDINATION OF CARE: 2:13 AM-Discussed treatment plan which includes IV fluids and labs with pt at bedside and pt agreed to plan.   Labs Review Results for orders placed or performed during the hospital encounter of 50/35/46  Basic metabolic panel  Result Value Ref Range   Sodium 123 (L) 135 - 145 mmol/L   Potassium 4.3 3.5 - 5.1 mmol/L   Chloride 84 (L) 101 - 111 mmol/L   CO2 25 22 - 32 mmol/L   Glucose, Bld 189 (H) 65 - 99 mg/dL   BUN <5 (L) 6 - 20 mg/dL   Creatinine, Ser 0.43 (L) 0.44 - 1.00 mg/dL   Calcium 9.8 8.9 - 10.3 mg/dL   GFR calc non Af Amer >60 >60 mL/min   GFR calc Af Amer >60 >60 mL/min   Anion gap 14 5 - 15  CBC with Differential  Result Value Ref Range   WBC 10.1 4.0 - 10.5 K/uL   RBC 3.96 3.87 - 5.11 MIL/uL   Hemoglobin 12.2 12.0 - 15.0 g/dL   HCT 34.6 (L) 36.0 - 46.0 %   MCV 87.4 78.0 - 100.0 fL   MCH 30.8 26.0 - 34.0 pg   MCHC 35.3 30.0 - 36.0 g/dL   RDW 12.6 11.5 - 15.5 %   Platelets 296 150 - 400 K/uL   Neutrophils Relative % 68 %   Neutro Abs 6.9 1.7 - 7.7 K/uL   Lymphocytes Relative 23 %   Lymphs Abs 2.4 0.7 - 4.0 K/uL   Monocytes Relative 8 %   Monocytes Absolute 0.8 0.1 - 1.0 K/uL   Eosinophils Relative 1 %   Eosinophils Absolute 0.1 0.0 - 0.7 K/uL   Basophils Relative 0 %   Basophils Absolute 0.0 0.0 - 0.1 K/uL  I-Stat CG4 Lactic Acid, ED  Result Value Ref Range   Lactic Acid, Venous 2.09 (HH) 0.5 - 2.0 mmol/L   Comment NOTIFIED PHYSICIAN    Imaging Review Dg Chest 2 View  05/20/2015   CLINICAL DATA:  Productive cough.  Fatigue.  Three days duration.  EXAM: CHEST  2 VIEW  COMPARISON:  11/24/2014  FINDINGS: Extensive nodular infiltrates throughout both lungs appear worsened from 11/24/2014. This could be infectious or inflammatory. Neoplasm cannot be excluded. Chronic interstitial disease may also be a factor. No effusions are evident. Hilar and  mediastinal contours are unremarkable.  IMPRESSION: Diffuse nodularity throughout both lungs, indeterminate with regard to infectious, inflammatory or neoplastic etiology. This may also relate to a chronic interstitial disease but it is worsened from 11/24/2014.   Electronically Signed   By: Andreas Newport M.D.   On: 05/20/2015 01:17   I have personally reviewed and evaluated these images and lab results as part of my medical decision-making.  CRITICAL CARE Performed by: Delora Fuel Total critical care time: 40 minutes Critical care time was exclusive of separately billable procedures and treating other patients. Critical care was necessary to treat or prevent imminent or life-threatening deterioration. Critical care was time spent personally by me on the following activities: development of treatment plan with patient and/or surrogate as well as nursing, discussions with consultants, evaluation of patient's response to treatment, examination of patient, obtaining history from patient or surrogate, ordering and performing treatments and interventions, ordering and review of laboratory studies, ordering and review of radiographic studies, pulse oximetry and re-evaluation of patient's condition. MDM   Final diagnoses:  Chest congestion  Productive cough    COPD exacerbation with x-ray evidence of interstitial pneumonia. Old records are reviewed and she has had hospitalizations for COPD exacerbation. She is given steroids and breathing treatment with albuterol and ipratropium. She started on antibody of ceftriaxone and azithromycin.  Following above-noted treatment, she continued to have significant dyspnea and wheezing. Albuterol with ipratropium was repeated and there was only minimal improvement. Advised the patient and she likely would need to be admitted, but she insisted that she felt fine and would want to go home. She was given a third nebulizer treatment. Following this, she was breathing  somewhat easier and is able to speak in full sentences. However, she still had ongoing wheezing. With her pneumonia, large right disturbance, and ongoing wheezing, I felt she needed to be in the hospital and tried to explain this to her. However, she insisted that she would be fine at at her assisted living facility and refused to be admitted. She is allowed to sign out [place]. She is given prescriptions for amoxicillin, prednisone, and albuterol inhaler. She is encouraged to have a follow-up appointment with her PCP in the next several days and to return if she changes her mind or if symptoms are getting worse at all. Patient expresses understanding.  I, Denyce Harr, personally performed the services described in this documentation. All medical record entries made by the scribe were at my direction and in my presence.  I have reviewed the chart and discharge instructions and agree that the record reflects my personal performance and is accurate and complete. Colyn Miron.  05/20/2015. 2:34 AM.      Delora Fuel, MD 26/71/24 [id number]

## 2015-05-20 NOTE — ED Notes (Signed)
Pt. arrived with [facility] from [facility] , reports productive cough with chest congestion , headache and generalized body aches / fatigue onset Wednesday this week .Marland Kitchen Denies fever or chills.

## 2015-05-20 NOTE — ED Notes (Signed)
Notified PTAR for transportation back home 

## 2015-05-20 NOTE — Discharge Instructions (Signed)
You're leaving against my medical advice. You have a pneumonia which requires anabiotic some would do much better with intravenous anabiotic initially. Also, your sodium level is very low which normally would require monitoring in the hospital. At any point, if you change your mind about being admitted, please return immediately. In the meantime, take the antibiotic and the steroid. Use the inhaler as needed for wheezing or difficulty breathing. Please monitor your blood sugars closely since steroids can raise her blood sugar.  Community-Acquired Pneumonia, Adult Pneumonia is an infection of the lungs. There are different types of pneumonia. One type can develop while a person is in a hospital. A different type, called community-acquired pneumonia, develops in people who are not, or have not recently been, in the hospital or other health care facility.  CAUSES Pneumonia may be caused by bacteria, viruses, or funguses. Community-acquired pneumonia is often caused by Streptococcus pneumonia bacteria. These bacteria are often passed from one person to another by breathing in droplets from the cough or sneeze of an infected person. RISK FACTORS The condition is more likely to develop in:  People who havechronic diseases, such as chronic obstructive pulmonary disease (COPD), asthma, congestive heart failure, cystic fibrosis, diabetes, or kidney disease.  People who haveearly-stage or late-stage HIV.  People who havesickle cell disease.  People who havehad their spleen removed (splenectomy).  People who havepoor Human resources officer.  People who havemedical conditions that increase the risk of breathing in (aspirating) secretions their own mouth and nose.   People who havea weakened immune system (immunocompromised).  People who smoke.  People whotravel to areas where pneumonia-causing germs commonly exist.  People whoare around animal habitats or animals that have pneumonia-causing germs,  including birds, bats, rabbits, cats, and farm animals. SYMPTOMS Symptoms of this condition include:  Adry cough.  A wet (productive) cough.  Fever.  Sweating.  Chest pain, especially when breathing deeply or coughing.  Rapid breathing or difficulty breathing.  Shortness of breath.  Shaking chills.  Fatigue.  Muscle aches. DIAGNOSIS Your health care provider will take a medical history and perform a physical exam. You may also have other tests, including:  Imaging studies of your chest, including X-rays.  Tests to check your blood oxygen level and other blood gases.  Other tests on blood, mucus (sputum), fluid around your lungs (pleural fluid), and urine. If your pneumonia is severe, other tests may be done to identify the specific cause of your illness. TREATMENT The type of treatment that you receive depends on many factors, such as the cause of your pneumonia, the medicines you take, and other medical conditions that you have. For most adults, treatment and recovery from pneumonia may occur at home. In some cases, treatment must happen in a hospital. Treatment may include:  Antibiotic medicines, if the pneumonia was caused by bacteria.  Antiviral medicines, if the pneumonia was caused by a virus.  Medicines that are given by mouth or through an IV tube.  Oxygen.  Respiratory therapy. Although rare, treating severe pneumonia may include:  Mechanical ventilation. This is done if you are not breathing well on your own and you cannot maintain a safe blood oxygen level.  Thoracentesis. This procedureremoves fluid around one lung or both lungs to help you breathe better. HOME CARE INSTRUCTIONS 1. Take over-the-counter and prescription medicines only as told by your health care provider. 1. Only takecough medicine if you are losing sleep. Understand that cough medicine can prevent your body's natural ability to remove mucus from  your lungs. 2. If you were prescribed  an antibiotic medicine, take it as told by your health care provider. Do not stop taking the antibiotic even if you start to feel better. 2. Sleep in a semi-upright position at night. Try sleeping in a reclining chair, or place a few pillows under your head. 3. Do not use tobacco products, including cigarettes, chewing tobacco, and e-cigarettes. If you need help quitting, ask your health care provider. 4. Drink enough water to keep your urine clear or pale yellow. This will help to thin out mucus secretions in your lungs. PREVENTION There are ways that you can decrease your risk of developing community-acquired pneumonia. Consider getting a pneumococcal vaccine if:  You are older than 55 years of age.  You are older than 55 years of age and are undergoing cancer treatment, have chronic lung disease, or have other medical conditions that affect your immune system. Ask your health care provider if this applies to you. There are different types and schedules of pneumococcal vaccines. Ask your health care provider which vaccination option is best for you. You may also prevent community-acquired pneumonia if you take these actions:  Get an influenza vaccine every year. Ask your health care provider which type of influenza vaccine is best for you.  Go to the dentist on a regular basis.  Wash your hands often. Use hand sanitizer if soap and water are not available. SEEK MEDICAL CARE IF:  You have a fever.  You are losing sleep because you cannot control your cough with cough medicine. SEEK IMMEDIATE MEDICAL CARE IF:  You have worsening shortness of breath.  You have increased chest pain.  Your sickness becomes worse, especially if you are an older adult or have a weakened immune system.  You cough up blood.   This information is not intended to replace advice given to you by your health care provider. Make sure you discuss any questions you have with your health care provider.   Document  Released: 07/28/2005 Document Revised: 04/18/2015 Document Reviewed: 11/22/2014 Elsevier Interactive Patient Education 2016 Else[employer] Inc.  Chronic Obstructive Pulmonary Disease Chronic obstructive pulmonary disease (COPD) is a common lung condition in which airflow from the lungs is limited. COPD is a general term that can be used to describe many different lung problems that limit airflow, including both chronic bronchitis and emphysema. If you have COPD, your lung function will probably never return to normal, but there are measures you can take to improve lung function and make yourself feel better. CAUSES   Smoking (common).  Exposure to secondhand smoke.  Genetic problems.  Chronic inflammatory lung diseases or recurrent infections. SYMPTOMS  Shortness of breath, especially with physical activity.  Deep, persistent (chronic) cough with a large amount of thick mucus.  Wheezing.  Rapid breaths (tachypnea).  Gray or bluish discoloration (cyanosis) of the skin, especially in your fingers, toes, or lips.  Fatigue.  Weight loss.  Frequent infections or episodes when breathing symptoms become much worse (exacerbations).  Chest tightness. DIAGNOSIS Your health care provider will take a medical history and perform a physical examination to diagnose COPD. Additional tests for COPD may include:  Lung (pulmonary) function tests.  Chest X-ray.  CT scan.  Blood tests. TREATMENT  Treatment for COPD may include:  Inhaler and nebulizer medicines. These help manage the symptoms of COPD and make your breathing more comfortable.  Supplemental oxygen. Supplemental oxygen is only helpful if you have a low oxygen level in your blood.  Exercise and physical activity. These are beneficial for nearly all people with COPD.  Lung surgery or transplant.  Nutrition therapy to gain weight, if you are underweight.  Pulmonary rehabilitation. This may involve working with a team of health  care providers and specialists, such as respiratory, occupational, and physical therapists. HOME CARE INSTRUCTIONS  Take all medicines (inhaled or pills) as directed by your health care provider.  Avoid over-the-counter medicines or cough syrups that dry up your airway (such as antihistamines) and slow down the elimination of secretions unless instructed otherwise by your health care provider.  If you are a smoker, the most important thing that you can do is stop smoking. Continuing to smoke will cause further lung damage and breathing trouble. Ask your health care provider for help with quitting smoking. He or she can direct you to community resources or hospitals that provide support.  Avoid exposure to irritants such as smoke, chemicals, and fumes that aggravate your breathing.  Use oxygen therapy and pulmonary rehabilitation if directed by your health care provider. If you require home oxygen therapy, ask your health care provider whether you should purchase a pulse oximeter to measure your oxygen level at home.  Avoid contact with individuals who have a contagious illness.  Avoid extreme temperature and humidity changes.  Eat healthy foods. Eating smaller, more frequent meals and resting before meals may help you maintain your strength.  Stay active, but balance activity with periods of rest. Exercise and physical activity will help you maintain your ability to do things you want to do.  Preventing infection and hospitalization is very important when you have COPD. Make sure to receive all the vaccines your health care provider recommends, especially the pneumococcal and influenza vaccines. Ask your health care provider whether you need a pneumonia vaccine.  Learn and use relaxation techniques to manage stress.  Learn and use controlled breathing techniques as directed by your health care provider. Controlled breathing techniques include:  Pursed lip breathing. Start by breathing in  (inhaling) through your nose for 1 second. Then, purse your lips as if you were going to whistle and breathe out (exhale) through the pursed lips for 2 seconds.  Diaphragmatic breathing. Start by putting one hand on your abdomen just above your waist. Inhale slowly through your nose. The hand on your abdomen should move out. Then purse your lips and exhale slowly. You should be able to feel the hand on your abdomen moving in as you exhale.  Learn and use controlled coughing to clear mucus from your lungs. Controlled coughing is a series of short, progressive coughs. The steps of controlled coughing are: 5. Lean your head slightly forward. 6. Breathe in deeply using diaphragmatic breathing. 7. Try to hold your breath for 3 seconds. 8. Keep your mouth slightly open while coughing twice. 9. Spit any mucus out into a tissue. 10. Rest and repeat the steps once or twice as needed. SEEK MEDICAL CARE IF:  You are coughing up more mucus than usual.  There is a change in the color or thickness of your mucus.  Your breathing is more labored than usual.  Your breathing is faster than usual. SEEK IMMEDIATE MEDICAL CARE IF:  You have shortness of breath while you are resting.  You have shortness of breath that prevents you from:  Being able to talk.  Performing your usual physical activities.  You have chest pain lasting longer than 5 minutes.  Your skin color is more cyanotic than usual.  You  measure low oxygen saturations for longer than 5 minutes with a pulse oximeter. MAKE SURE YOU:  Understand these instructions.  Will watch your condition.  Will get help right away if you are not doing well or get worse.   This information is not intended to replace advice given to you by your health care provider. Make sure you discuss any questions you have with your health care provider.   Document Released: 05/07/2005 Document Revised: 08/18/2014 Document Reviewed: 03/24/2013 Elsevier  Interactive Patient Education 2016 Elsevier Inc.  Amoxicillin capsules or tablets What is this medicine? AMOXICILLIN (a mox i SIL in) is a penicillin antibiotic. It is used to treat certain kinds of bacterial infections. It will not work for colds, flu, or other viral infections. This medicine may be used for other purposes; ask your health care provider or pharmacist if you have questions. What should I tell my health care provider before I take this medicine? They need to know if you have any of these conditions: -asthma -kidney disease -an unusual or allergic reaction to amoxicillin, other penicillins, cephalosporin antibiotics, other medicines, foods, dyes, or preservatives -pregnant or trying to get pregnant -breast-feeding How should I use this medicine? Take this medicine by mouth with a glass of water. Follow the directions on your prescription label. You may take this medicine with food or on an empty stomach. Take your medicine at regular intervals. Do not take your medicine more often than directed. Take all of your medicine as directed even if you think your are better. Do not skip doses or stop your medicine early. Talk to your pediatrician regarding the use of this medicine in children. While this drug may be prescribed for selected conditions, precautions do apply. Overdosage: If you think you have taken too much of this medicine contact a poison control center or emergency room at once. NOTE: This medicine is only for you. Do not share this medicine with others. What if I miss a dose? If you miss a dose, take it as soon as you can. If it is almost time for your next dose, take only that dose. Do not take double or extra doses. What may interact with this medicine? -amiloride -birth control pills -chloramphenicol -macrolides -probenecid -sulfonamides -tetracyclines This list may not describe all possible interactions. Give your health care provider a list of all the  medicines, herbs, non-prescription drugs, or dietary supplements you use. Also tell them if you smoke, drink alcohol, or use illegal drugs. Some items may interact with your medicine. What should I watch for while using this medicine? Tell your doctor or health care professional if your symptoms do not improve in 2 or 3 days. Take all of the doses of your medicine as directed. Do not skip doses or stop your medicine early. If you are diabetic, you may get a false positive result for sugar in your urine with certain brands of urine tests. Check with your doctor. Do not treat diarrhea with over-the-counter products. Contact your doctor if you have diarrhea that lasts more than 2 days or if the diarrhea is severe and watery. What side effects may I notice from receiving this medicine? Side effects that you should report to your doctor or health care professional as soon as possible: -allergic reactions like skin rash, itching or hives, swelling of the face, lips, or tongue -breathing problems -dark urine -redness, blistering, peeling or loosening of the skin, including inside the mouth -seizures -severe or watery diarrhea -trouble passing urine or  change in the amount of urine -unusual bleeding or bruising -unusually weak or tired -yellowing of the eyes or skin Side effects that usually do not require medical attention (report to your doctor or health care professional if they continue or are bothersome): -dizziness -headache -stomach upset -trouble sleeping This list may not describe all possible side effects. Call your doctor for medical advice about side effects. You may report side effects to FDA at 1-800-FDA-1088. Where should I keep my medicine? Keep out of the reach of children. Store between 68 and 77 degrees F (20 and 25 degrees C). Keep bottle closed tightly. Throw away any unused medicine after the expiration date. NOTE: This sheet is a summary. It may not cover all possible  information. If you have questions about this medicine, talk to your doctor, pharmacist, or health care provider.    2016, Elsevier/Gold Standard. (2007-10-19 14:10:59)  Prednisone tablets What is this medicine? PREDNISONE (PRED ni sone) is a corticosteroid. It is commonly used to treat inflammation of the skin, joints, lungs, and other organs. Common conditions treated include asthma, allergies, and arthritis. It is also used for other conditions, such as blood disorders and diseases of the adrenal glands. This medicine may be used for other purposes; ask your health care provider or pharmacist if you have questions. What should I tell my health care provider before I take this medicine? They need to know if you have any of these conditions: -Cushing's syndrome -diabetes -glaucoma -heart disease -high blood pressure -infection (especially a virus infection such as chickenpox, cold sores, or herpes) -kidney disease -liver disease -mental illness -myasthenia gravis -osteoporosis -seizures -stomach or intestine problems -thyroid disease -an unusual or allergic reaction to lactose, prednisone, other medicines, foods, dyes, or preservatives -pregnant or trying to get pregnant -breast-feeding How should I use this medicine? Take this medicine by mouth with a glass of water. Follow the directions on the prescription label. Take this medicine with food. If you are taking this medicine once a day, take it in the morning. Do not take more medicine than you are told to take. Do not suddenly stop taking your medicine because you may develop a severe reaction. Your doctor will tell you how much medicine to take. If your doctor wants you to stop the medicine, the dose may be slowly lowered over time to avoid any side effects. Talk to your pediatrician regarding the use of this medicine in children. Special care may be needed. Overdosage: If you think you have taken too much of this medicine contact  a poison control center or emergency room at once. NOTE: This medicine is only for you. Do not share this medicine with others. What if I miss a dose? If you miss a dose, take it as soon as you can. If it is almost time for your next dose, talk to your doctor or health care professional. You may need to miss a dose or take an extra dose. Do not take double or extra doses without advice. What may interact with this medicine? Do not take this medicine with any of the following medications: -metyrapone -mifepristone This medicine may also interact with the following medications: -aminoglutethimide -amphotericin B -aspirin and aspirin-like medicines -barbiturates -certain medicines for diabetes, like glipizide or glyburide -cholestyramine -cholinesterase inhibitors -cyclosporine -digoxin -diuretics -ephedrine -female hormones, like estrogens and birth control pills -isoniazid -ketoconazole -NSAIDS, medicines for pain and inflammation, like ibuprofen or naproxen -phenytoin -rifampin -toxoids -vaccines -warfarin This list may not describe all possible interactions.  Give your health care provider a list of all the medicines, herbs, non-prescription drugs, or dietary supplements you use. Also tell them if you smoke, drink alcohol, or use illegal drugs. Some items may interact with your medicine. What should I watch for while using this medicine? Visit your doctor or health care professional for regular checks on your progress. If you are taking this medicine over a prolonged period, carry an identification card with your name and address, the type and dose of your medicine, and your doctor's name and address. This medicine may increase your risk of getting an infection. Tell your doctor or health care professional if you are around anyone with measles or chickenpox, or if you develop sores or blisters that do not heal properly. If you are going to have surgery, tell your doctor or health  care professional that you have taken this medicine within the last twelve months. Ask your doctor or health care professional about your diet. You may need to lower the amount of salt you eat. This medicine may affect blood sugar levels. If you have diabetes, check with your doctor or health care professional before you change your diet or the dose of your diabetic medicine. What side effects may I notice from receiving this medicine? Side effects that you should report to your doctor or health care professional as soon as possible: -allergic reactions like skin rash, itching or hives, swelling of the face, lips, or tongue -changes in emotions or moods -changes in vision -depressed mood -eye pain -fever or chills, cough, sore throat, pain or difficulty passing urine -increased thirst -swelling of ankles, feet Side effects that usually do not require medical attention (report to your doctor or health care professional if they continue or are bothersome): -confusion, excitement, restlessness -headache -nausea, vomiting -skin problems, acne, thin and shiny skin -trouble sleeping -weight gain This list may not describe all possible side effects. Call your doctor for medical advice about side effects. You may report side effects to FDA at 1-800-FDA-1088. Where should I keep my medicine? Keep out of the reach of children. Store at room temperature between 15 and 30 degrees C (59 and 86 degrees F). Protect from light. Keep container tightly closed. Throw away any unused medicine after the expiration date. NOTE: This sheet is a summary. It may not cover all possible information. If you have questions about this medicine, talk to your doctor, pharmacist, or health care provider.    2016, Elsevier/Gold Standard. (2011-03-13 10:57:14)  Albuterol inhalation aerosol What is this medicine? ALBUTEROL (al [place]) is a bronchodilator. It helps open up the airways in your lungs to make it easier  to breathe. This medicine is used to treat and to prevent bronchospasm. This medicine may be used for other purposes; ask your health care provider or pharmacist if you have questions. What should I tell my health care provider before I take this medicine? They need to know if you have any of the following conditions: -diabetes -heart disease or irregular heartbeat -high blood pressure -pheochromocytoma -seizures -thyroid disease -an unusual or allergic reaction to albuterol, levalbuterol, sulfites, other medicines, foods, dyes, or preservatives -pregnant or trying to get pregnant -breast-feeding How should I use this medicine? This medicine is for inhalation through the mouth. Follow the directions on your prescription label. Take your medicine at regular intervals. Do not use more often than directed. Make sure that you are using your inhaler correctly. Ask you doctor or health care provider if you  have any questions. Talk to your pediatrician regarding the use of this medicine in children. Special care may be needed. Overdosage: If you think you have taken too much of this medicine contact a poison control center or emergency room at once. NOTE: This medicine is only for you. Do not share this medicine with others. What if I miss a dose? If you miss a dose, use it as soon as you can. If it is almost time for your next dose, use only that dose. Do not use double or extra doses. What may interact with this medicine? -anti-infectives like chloroquine and pentamidine -caffeine -cisapride -diuretics -medicines for colds -medicines for depression or for emotional or psychotic conditions -medicines for weight loss including some herbal products -methadone -some antibiotics like clarithromycin, erythromycin, levofloxacin, and linezolid -some heart medicines -steroid hormones like dexamethasone, cortisone, hydrocortisone -theophylline -thyroid hormones This list may not describe all  possible interactions. Give your health care provider a list of all the medicines, herbs, non-prescription drugs, or dietary supplements you use. Also tell them if you smoke, drink alcohol, or use illegal drugs. Some items may interact with your medicine. What should I watch for while using this medicine? Tell your doctor or health care professional if your symptoms do not improve. Do not use extra albuterol. If your asthma or bronchitis gets worse while you are using this medicine, call your doctor right away. If your mouth gets dry try chewing sugarless gum or sucking hard candy. Drink water as directed. What side effects may I notice from receiving this medicine? Side effects that you should report to your doctor or health care professional as soon as possible: -allergic reactions like skin rash, itching or hives, swelling of the face, lips, or tongue -breathing problems -chest pain -feeling faint or lightheaded, falls -high blood pressure -irregular heartbeat -fever -muscle cramps or weakness -pain, tingling, numbness in the hands or feet -vomiting Side effects that usually do not require medical attention (report to your doctor or health care professional if they continue or are bothersome): -cough -difficulty sleeping -headache -nervousness or trembling -stomach upset -stuffy or runny nose -throat irritation -unusual taste This list may not describe all possible side effects. Call your doctor for medical advice about side effects. You may report side effects to FDA at 1-800-FDA-1088. Where should I keep my medicine? Keep out of the reach of children. Store at room temperature between 15 and 30 degrees C (59 and 86 degrees F). The contents are under pressure and may burst when exposed to heat or flame. Do not freeze. This medicine does not work as well if it is too cold. Throw away any unused medicine after the expiration date. Inhalers need to be thrown away after the labeled number  of puffs have been used or by the expiration date; whichever comes first. Ventolin HFA should be thrown away 12 months after removing from foil pouch. Check the instructions that come with your medicine. NOTE: This sheet is a summary. It may not cover all possible information. If you have questions about this medicine, talk to your doctor, pharmacist, or health care provider.    2016, Elsevier/Gold Standard. (2013-01-13 10:57:17)

## 2015-05-20 NOTE — ED Notes (Signed)
Pt changed to a recliner for comfort

## 2015-06-05 ENCOUNTER — Inpatient Hospital Stay (HOSPITAL_COMMUNITY)
Admission: EM | Admit: 2015-06-05 | Discharge: 2015-06-11 | DRG: 644 | Disposition: A | Discharge status: Home Health | Payer: Medicaid Other | Attending: Internal Medicine | Admitting: Internal Medicine

## 2015-06-05 ENCOUNTER — Emergency Department (HOSPITAL_COMMUNITY): Payer: Medicaid Other

## 2015-06-05 ENCOUNTER — Encounter (HOSPITAL_COMMUNITY): Payer: Self-pay | Admitting: Emergency Medicine

## 2015-06-05 DIAGNOSIS — E119 Type 2 diabetes mellitus without complications: Secondary | ICD-10-CM | POA: Diagnosis present

## 2015-06-05 DIAGNOSIS — R519 Headache, unspecified: Secondary | ICD-10-CM | POA: Insufficient documentation

## 2015-06-05 DIAGNOSIS — E222 Syndrome of inappropriate secretion of antidiuretic hormone: Principal | ICD-10-CM | POA: Diagnosis present

## 2015-06-05 DIAGNOSIS — E669 Obesity, unspecified: Secondary | ICD-10-CM | POA: Diagnosis present

## 2015-06-05 DIAGNOSIS — IMO0002 Reserved for concepts with insufficient information to code with codable children: Secondary | ICD-10-CM | POA: Diagnosis present

## 2015-06-05 DIAGNOSIS — J449 Chronic obstructive pulmonary disease, unspecified: Secondary | ICD-10-CM | POA: Diagnosis present

## 2015-06-05 DIAGNOSIS — M791 Myalgia, unspecified site: Secondary | ICD-10-CM

## 2015-06-05 DIAGNOSIS — R0600 Dyspnea, unspecified: Secondary | ICD-10-CM

## 2015-06-05 DIAGNOSIS — Z720 Tobacco use: Secondary | ICD-10-CM | POA: Diagnosis present

## 2015-06-05 DIAGNOSIS — J9610 Chronic respiratory failure, unspecified whether with hypoxia or hypercapnia: Secondary | ICD-10-CM | POA: Diagnosis present

## 2015-06-05 DIAGNOSIS — M79606 Pain in leg, unspecified: Secondary | ICD-10-CM

## 2015-06-05 DIAGNOSIS — E871 Hypo-osmolality and hyponatremia: Secondary | ICD-10-CM | POA: Diagnosis present

## 2015-06-05 DIAGNOSIS — Z833 Family history of diabetes mellitus: Secondary | ICD-10-CM

## 2015-06-05 DIAGNOSIS — Z6841 Body Mass Index (BMI) 40.0 and over, adult: Secondary | ICD-10-CM

## 2015-06-05 DIAGNOSIS — F319 Bipolar disorder, unspecified: Secondary | ICD-10-CM

## 2015-06-05 DIAGNOSIS — Z9981 Dependence on supplemental oxygen: Secondary | ICD-10-CM

## 2015-06-05 DIAGNOSIS — R51 Headache: Secondary | ICD-10-CM

## 2015-06-05 DIAGNOSIS — R062 Wheezing: Secondary | ICD-10-CM

## 2015-06-05 DIAGNOSIS — R531 Weakness: Secondary | ICD-10-CM

## 2015-06-05 DIAGNOSIS — I10 Essential (primary) hypertension: Secondary | ICD-10-CM | POA: Diagnosis present

## 2015-06-05 DIAGNOSIS — E1165 Type 2 diabetes mellitus with hyperglycemia: Secondary | ICD-10-CM

## 2015-06-05 DIAGNOSIS — D649 Anemia, unspecified: Secondary | ICD-10-CM | POA: Diagnosis present

## 2015-06-05 DIAGNOSIS — Z809 Family history of malignant neoplasm, unspecified: Secondary | ICD-10-CM

## 2015-06-05 DIAGNOSIS — F17210 Nicotine dependence, cigarettes, uncomplicated: Secondary | ICD-10-CM | POA: Diagnosis present

## 2015-06-05 DIAGNOSIS — E785 Hyperlipidemia, unspecified: Secondary | ICD-10-CM

## 2015-06-05 DIAGNOSIS — J9611 Chronic respiratory failure with hypoxia: Secondary | ICD-10-CM

## 2015-06-05 LAB — CBC
HCT: 32.9 % — ABNORMAL LOW (ref 36.0–46.0)
Hemoglobin: 11.6 g/dL — ABNORMAL LOW (ref 12.0–15.0)
MCH: 30 pg (ref 26.0–34.0)
MCHC: 35.3 g/dL (ref 30.0–36.0)
MCV: 85 fL (ref 78.0–100.0)
PLATELETS: 312 10*3/uL (ref 150–400)
RBC: 3.87 MIL/uL (ref 3.87–5.11)
RDW: 12.6 % (ref 11.5–15.5)
WBC: 7.3 10*3/uL (ref 4.0–10.5)

## 2015-06-05 LAB — TROPONIN I: Troponin I: <0.03 ng/mL (ref <0.031)

## 2015-06-05 LAB — BASIC METABOLIC PANEL
Anion gap: 14 (ref 5–15)
CO2: 21 mmol/L — ABNORMAL LOW (ref 22–32)
CREATININE: 0.38 mg/dL — AB (ref 0.44–1.00)
Calcium: 8.9 mg/dL (ref 8.9–10.3)
Chloride: 82 mmol/L — ABNORMAL LOW (ref 101–111)
GFR calc Af Amer: >60 mL/min (ref >60)
GLUCOSE: 136 mg/dL — AB (ref 65–99)
POTASSIUM: 4 mmol/L (ref 3.5–5.1)
SODIUM: 117 mmol/L — AB (ref 135–145)

## 2015-06-05 LAB — BRAIN NATRIURETIC PEPTIDE: B NATRIURETIC PEPTIDE 5: 20.5 pg/mL (ref 0.0–100.0)

## 2015-06-05 LAB — CBG MONITORING, ED: Glucose-Capillary: 120 mg/dL — ABNORMAL HIGH (ref 65–99)

## 2015-06-05 MED ORDER — FENTANYL CITRATE (PF) 100 MCG/2ML IJ SOLN
50.0000 ug | Freq: Once | INTRAMUSCULAR | Status: AC
  Administered 2015-06-05: 50 ug via INTRAVENOUS
  Filled 2015-06-05: qty 2

## 2015-06-05 MED ORDER — SODIUM CHLORIDE 0.9 % IV BOLUS (SEPSIS)
500.0000 mL | Freq: Once | INTRAVENOUS | Status: AC
  Administered 2015-06-05: 500 mL via INTRAVENOUS

## 2015-06-05 MED ORDER — ONDANSETRON HCL 4 MG/2ML IJ SOLN
4.0000 mg | Freq: Once | INTRAMUSCULAR | Status: AC
  Administered 2015-06-05: 4 mg via INTRAVENOUS
  Filled 2015-06-05: qty 2

## 2015-06-05 MED ORDER — HYDROMORPHONE HCL 1 MG/ML IJ SOLN
0.5000 mg | Freq: Once | INTRAMUSCULAR | Status: AC
  Administered 2015-06-05: 0.5 mg via INTRAVENOUS
  Filled 2015-06-05: qty 1

## 2015-06-05 MED ORDER — IPRATROPIUM BROMIDE 0.02 % IN SOLN
0.5000 mg | Freq: Once | RESPIRATORY_TRACT | Status: AC
  Administered 2015-06-05: 0.5 mg via RESPIRATORY_TRACT
  Filled 2015-06-05: qty 2.5

## 2015-06-05 MED ORDER — ALBUTEROL SULFATE (2.5 MG/3ML) 0.083% IN NEBU
5.0000 mg | INHALATION_SOLUTION | Freq: Once | RESPIRATORY_TRACT | Status: AC
  Administered 2015-06-05: 5 mg via RESPIRATORY_TRACT
  Filled 2015-06-05: qty 6

## 2015-06-05 NOTE — ED Provider Notes (Addendum)
CSN: [id number]     Arrival date & time 06/05/15  2026 History   First MD Initiated Contact with Patient 06/05/15 2032     Chief Complaint  Patient presents with  . Weakness  . Shortness of Breath     (Consider location/radiation/quality/duration/timing/severity/associated sxs/prior Treatment) Patient is a 54 y.o. female presenting with weakness and shortness of breath. The history is provided by the patient and the EMS personnel.  Weakness Associated symptoms include headaches and shortness of breath. Pertinent negatives include no chest pain and no abdominal pain.  Shortness of Breath Associated symptoms: cough, headaches, vomiting and wheezing   Associated symptoms: no abdominal pain, no chest pain, no fever, no neck pain, no rash and no sore throat   Patient c/o multiple c/o over the past several days. Pt c/o intermittent, dull, frontal headaches, gradual onset, over the past few days. C/w prior headaches. No acute/abrupt headaches, no 'worst' headaches. Also c/o intermittent nvd, with a couple episodes of each today. Diarrhea watery, not bloody. Emesis clear, not bloody or bilious. No abd pain or distension. No known ill contacts or bad food ingestion. Was recently on abx for pna. Pt notes intermittent cough, non prod cough. No chest pain. Mild sob, wheezing, and c/o ecf will not give her breathing txs frequently enough.  +nasal congestion. No sore throat.  ?dysuria, mild. No localized back or flank pain, but states pain all over entire body.  Denies fever or chills.      Past Medical History  Diagnosis Date  . Diabetes mellitus   . Pancreatitis   . Anemia   . Bipolar 1 disorder ([place])   . Alcohol abuse   . 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 COPD (chronic obstructive pulmonary disease) ([place])   . Hypotension   . Major depressive disorder ([place])   . Cocaine abuse   . Chronic back pain   . Renal disorder   . Acidosis   . Hyperkalemia   . Abscess   .  Acute encephalopathy   . Septic shock [facility])    Past Surgical History  Procedure Laterality Date  . Cholecystectomy    . Appendectomy    . Tonsillectomy    . Incision and drainage abscess Right 03/01/2013    Procedure: INCISION AND DRAINAGE ABSCESS;  Surgeon: Harl Bowie, MD;  Location: [facility];  Service: General;  Laterality: Right;  . Cesarean section     Family History  Problem Relation Age of Onset  . Diabetes Mother   . Cancer Mother     breast   Social History  Substance Use Topics  . Smoking status: Current Every Day Smoker -- 0.00 packs/day    Types: Cigarettes  . Smokeless tobacco: Never Used  . Alcohol Use: No     Comment: hx of etoh abuse   OB History    No data available     Review of Systems  Constitutional: Negative for fever and chills.  HENT: Positive for congestion. Negative for sore throat.   Eyes: Negative for pain, discharge and redness.  Respiratory: Positive for cough, shortness of breath and wheezing.   Cardiovascular: Negative for chest pain and leg swelling.  Gastrointestinal: Positive for vomiting and diarrhea. Negative for abdominal pain.  Endocrine: Negative for polyuria.  Genitourinary: Negative for flank pain.  Musculoskeletal: Positive for myalgias. Negative for neck pain and neck stiffness.  Skin: Negative for rash.  Neurological: Positive for weakness and headaches. Negative for numbness.  Hematological: Does not bruise/bleed easily.  Psychiatric/Behavioral: Negative for confusion.      Allergies  Review of patient's allergies indicates no known allergies.  Home Medications   Prior to Admission medications   Medication Sig Start Date End Date Taking? Authorizing Provider  acetaminophen (TYLENOL) 325 MG tablet Take 325 mg by mouth 3 (three) times daily. Prior to fish oil   Yes Historical Provider, MD  albuterol (PROVENTIL HFA;VENTOLIN HFA) 108 (90 BASE) MCG/ACT inhaler Inhale 2 puffs into the lungs every 4 (four) hours as  needed for wheezing or shortness of breath. 93/8/10  Yes Delora Fuel, MD  aspirin EC 81 MG tablet Take 81 mg by mouth daily.   Yes Historical Provider, MD  budesonide-formoterol (SYMBICORT) 80-4.5 MCG/ACT inhaler Inhale 2 puffs into the lungs 2 (two) times daily.   Yes Historical Provider, MD  calcium-vitamin D (OSCAL WITH D) 500-200 MG-UNIT tablet Take 1 tablet by mouth 2 (two) times daily.   Yes Historical Provider, MD  cholecalciferol (VITAMIN D) 1000 UNITS tablet Take 1,000 Units by mouth daily.   Yes Historical Provider, MD  docusate sodium (COLACE) 100 MG capsule Take 100 mg by mouth 2 (two) times daily.   Yes Historical Provider, MD  DULoxetine (CYMBALTA) 60 MG capsule Take 120 mg by mouth daily.    Yes Historical Provider, MD  gabapentin (NEURONTIN) 300 MG capsule Take 600 mg by mouth 3 (three) times daily.   Yes Historical Provider, MD  guaifenesin (ROBITUSSIN) 100 MG/5ML syrup Take 200 mg by mouth every 4 (four) hours as needed for cough.   Yes Historical Provider, MD  lamoTRIgine (LAMICTAL) 100 MG tablet Take 100 mg by mouth 2 (two) times daily. Takes with 25 mg tab   Yes Historical Provider, MD  lamoTRIgine (LAMICTAL) 25 MG tablet Take 25 mg by mouth 2 (two) times daily. Takes with '100mg'$  tablet   Yes Historical Provider, MD  lisinopril (PRINIVIL,ZESTRIL) 5 MG tablet Take 5 mg by mouth daily.   Yes Historical Provider, MD  LORazepam (ATIVAN) 0.5 MG tablet Take 1 tablet (0.5 mg total) by mouth every 8 (eight) hours as needed for anxiety. 11/29/14  Yes Nishant Dhungel, MD  metFORMIN (GLUCOPHAGE) 1000 MG tablet Take 1 tablet (1,000 mg total) by mouth 2 (two) times daily with a meal. 11/29/14  Yes Nishant Dhungel, MD  niacin (NIASPAN) 750 MG CR tablet Take 750 mg by mouth at bedtime.   Yes Historical Provider, MD  omeprazole (PRILOSEC) 20 MG capsule Take 20 mg by mouth daily before supper.    Yes Historical Provider, MD  promethazine (PHENERGAN) 12.5 MG tablet Take 12.5 mg by mouth every 6 (six)  hours as needed for nausea or vomiting.   Yes Historical Provider, MD  QUEtiapine (SEROQUEL XR) 400 MG 24 hr tablet Take 800 mg by mouth at bedtime.   Yes Historical Provider, MD  simvastatin (ZOCOR) 40 MG tablet Take 40 mg by mouth daily at 6 PM.    Yes Historical Provider, MD  tiotropium (SPIRIVA) 18 MCG inhalation capsule Place 18 mcg into inhaler and inhale daily.   Yes Historical Provider, MD  zolpidem (AMBIEN) 10 MG tablet Take 5 mg by mouth at bedtime.    Yes Historical Provider, MD  amoxicillin (AMOXIL) 500 MG capsule Take 2 capsules (1,000 mg total) by mouth 2 (two) times daily. 17/5/10   Delora Fuel, MD  predniSONE (DELTASONE) 50 MG tablet Take 1 tablet (50 mg total) by mouth daily. Patient not taking: Reported on 06/05/2015 05/20/15  Delora Fuel, MD   BP 381/82 mmHg  Pulse 92  Temp(Src) 98.7 F (37.1 C) (Rectal)  Resp 22  SpO2 98% Physical Exam  Constitutional: She is oriented to person, place, and time. She appears well-developed and well-nourished. No distress.  HENT:  Head: Atraumatic.  Nose: Nose normal.  Mouth/Throat: Oropharynx is clear and moist.  No sinus or temporal tenderness.  Eyes: Conjunctivae and EOM are normal. Pupils are equal, round, and reactive to light. No scleral icterus.  Neck: Neck supple. No tracheal deviation present. No thyromegaly present.  No stiffness or rigidity.   Cardiovascular: Normal rate, regular rhythm, normal heart sounds and intact distal pulses.  Exam reveals no gallop and no friction rub.   No murmur heard. Pulmonary/Chest: Effort normal. No respiratory distress. She has wheezes.  Abdominal: Soft. Normal appearance and bowel sounds are normal. She exhibits no distension and no mass. There is no tenderness. There is no rebound and no guarding.  Genitourinary:  No cva tenderness.  Musculoskeletal: Normal range of motion. She exhibits no edema or tenderness.  Neurological: She is alert and oriented to person, place, and time. No cranial  nerve deficit.  Motor intact bilaterally. stre 5/5. sens grossly intact. Steady gait.   Skin: Skin is warm and dry. No rash noted.  Psychiatric: She has a normal mood and affect.  Nursing note and vitals reviewed.   ED Course  Procedures (including critical care time) Labs Review   Results for orders placed or performed during the hospital encounter of 99/37/16  Basic metabolic panel  Result Value Ref Range   Sodium 117 (LL) 135 - 145 mmol/L   Potassium 4.0 3.5 - 5.1 mmol/L   Chloride 82 (L) 101 - 111 mmol/L   CO2 21 (L) 22 - 32 mmol/L   Glucose, Bld 136 (H) 65 - 99 mg/dL   BUN <5 (L) 6 - 20 mg/dL   Creatinine, Ser 0.38 (L) 0.44 - 1.00 mg/dL   Calcium 8.9 8.9 - 10.3 mg/dL   GFR calc non Af Amer >60 >60 mL/min   GFR calc Af Amer >60 >60 mL/min   Anion gap 14 5 - 15  CBC  Result Value Ref Range   WBC 7.3 4.0 - 10.5 K/uL   RBC 3.87 3.87 - 5.11 MIL/uL   Hemoglobin 11.6 (L) 12.0 - 15.0 g/dL   HCT 32.9 (L) 36.0 - 46.0 %   MCV 85.0 78.0 - 100.0 fL   MCH 30.0 26.0 - 34.0 pg   MCHC 35.3 30.0 - 36.0 g/dL   RDW 12.6 11.5 - 15.5 %   Platelets 312 150 - 400 K/uL  Brain natriuretic peptide  Result Value Ref Range   B Natriuretic Peptide 20.5 0.0 - 100.0 pg/mL  Troponin I  Result Value Ref Range   Troponin I <0.03 <0.031 ng/mL  CBG monitoring, ED  Result Value Ref Range   Glucose-Capillary 120 (H) 65 - 99 mg/dL   Dg Chest 2 View  06/05/2015  CLINICAL DATA:  Subacute onset of shortness of breath, cough and headaches. Initial encounter. EXAM: CHEST  2 VIEW COMPARISON:  Chest radiograph performed 05/20/2015, and CTA of the chest performed 11/25/2014 FINDINGS: The lungs are well-aerated. Vascular congestion is noted. Diffuse interstitial opacities are slightly less well characterized due to motion artifact, but appear relatively similar to the prior study, and worsened from April. This is concerning for worsening chronic interstitial lung disease, though superimposed pneumonia cannot be  excluded. The heart is borderline normal in size.  No acute osseous abnormalities are seen. IMPRESSION: Vascular congestion noted. Diffuse interstitial opacities are slightly less well characterized due to motion artifact, but appear relatively similar to the prior study, and worsened from April. This is concerning for worsening chronic interstitial lung disease, though superimposed pneumonia cannot be excluded. Electronically Signed   By: Garald Balding M.D.   On: 06/05/2015 21:19   Dg Chest 2 View  05/20/2015  CLINICAL DATA:  Productive cough.  Fatigue.  Three days duration. EXAM: CHEST  2 VIEW COMPARISON:  11/24/2014 FINDINGS: Extensive nodular infiltrates throughout both lungs appear worsened from 11/24/2014. This could be infectious or inflammatory. Neoplasm cannot be excluded. Chronic interstitial disease may also be a factor. No effusions are evident. Hilar and mediastinal contours are unremarkable. IMPRESSION: Diffuse nodularity throughout both lungs, indeterminate with regard to infectious, inflammatory or neoplastic etiology. This may also relate to a chronic interstitial disease but it is worsened from 11/24/2014. Electronically Signed   By: Andreas Newport M.D.   On: 05/20/2015 01:17       I have personally reviewed and evaluated these images and lab results as part of my medical decision-making.   EKG Interpretation   Date/Time:  Tuesday June 05 2015 20:36:23 EDT Ventricular Rate:  91 PR Interval:  162 QRS Duration: 101 QT Interval:  364 QTC Calculation: 448 R Axis:   60 Text Interpretation:  Sinus rhythm No significant change since last  tracing Confirmed by Dilan Novosad  MD, Lennette Bihari ([id number]) on 06/05/2015 9:50:08 PM      MDM   Iv ns. Labs. Cxr. Continuous pulse ox and monitor. Pt placed on oxygen.   Albuterol and atrovent neb.  Pt requests pain med. Dilaudid .5 mg iv. zofran iv. Iv ns.  Reviewed nursing notes and prior charts for additional history.   Na critically low -  continuous cardiac monitoring and pulse ox.   Ns boluses.   Recheck, no change in mental status as compared to prior, vitals stable.  Wheezing mildly improved, additional neb tx.  CRITICAL CARE  RE: weakness, severe hyponatremia, dyspnea/wheezing Performed by: Mirna Mires Total critical care time: 35 Critical care time was exclusive of separately billable procedures and treating other patients. Critical care was necessary to treat or prevent imminent or life-threatening deterioration. Critical care was time spent personally by me on the following activities: development of treatment plan with patient and/or surrogate as well as nursing, discussions with consultants, evaluation of patient's response to treatment, examination of patient, obtaining history from patient or surrogate, ordering and performing treatments and interventions, ordering and review of laboratory studies, ordering and review of radiographic studies, pulse oximetry and re-evaluation of patient's condition.   Hospitalists contacted for admission.       Lajean Saver, MD 06/05/15 [phone]

## 2015-06-05 NOTE — ED Notes (Signed)
Ems reports patient has been having multiple complaints for the last 3 days, reporting poor appetite, headache, and nausea. Hx of migraines, diabetic. cbg 155, bp 136/83, p 86, o2 sat 97% on 3L, patient always wears at 3L. From [facility]. Recently treated for PNA, finished last dose of amoxicillin 2 days ago.

## 2015-06-05 NOTE — ED Notes (Signed)
Reported sodium of 117 to Dr. Ashok Cordia. MD acknowledges.

## 2015-06-05 NOTE — ED Notes (Signed)
Called  Main lab for troponin and bnp add on, spoke to [place].

## 2015-06-05 NOTE — ED Notes (Signed)
Blood sample to minilab

## 2015-06-05 NOTE — ED Notes (Signed)
cbg is 120

## 2015-06-05 NOTE — ED Notes (Addendum)
Patient to xray.

## 2015-06-05 NOTE — ED Notes (Signed)
Patient currently eating meal

## 2015-06-06 ENCOUNTER — Observation Stay (HOSPITAL_COMMUNITY): Payer: Medicaid Other

## 2015-06-06 ENCOUNTER — Encounter (HOSPITAL_COMMUNITY): Payer: Self-pay | Admitting: Family Medicine

## 2015-06-06 DIAGNOSIS — E119 Type 2 diabetes mellitus without complications: Secondary | ICD-10-CM | POA: Diagnosis present

## 2015-06-06 DIAGNOSIS — J9611 Chronic respiratory failure with hypoxia: Secondary | ICD-10-CM

## 2015-06-06 DIAGNOSIS — F17210 Nicotine dependence, cigarettes, uncomplicated: Secondary | ICD-10-CM | POA: Diagnosis present

## 2015-06-06 DIAGNOSIS — Z9981 Dependence on supplemental oxygen: Secondary | ICD-10-CM | POA: Diagnosis not present

## 2015-06-06 DIAGNOSIS — J441 Chronic obstructive pulmonary disease with (acute) exacerbation: Secondary | ICD-10-CM | POA: Diagnosis not present

## 2015-06-06 DIAGNOSIS — I10 Essential (primary) hypertension: Secondary | ICD-10-CM | POA: Diagnosis present

## 2015-06-06 DIAGNOSIS — F319 Bipolar disorder, unspecified: Secondary | ICD-10-CM

## 2015-06-06 DIAGNOSIS — Z720 Tobacco use: Secondary | ICD-10-CM | POA: Diagnosis not present

## 2015-06-06 DIAGNOSIS — E1122 Type 2 diabetes mellitus with diabetic chronic kidney disease: Secondary | ICD-10-CM | POA: Diagnosis not present

## 2015-06-06 DIAGNOSIS — Z833 Family history of diabetes mellitus: Secondary | ICD-10-CM | POA: Diagnosis not present

## 2015-06-06 DIAGNOSIS — J449 Chronic obstructive pulmonary disease, unspecified: Secondary | ICD-10-CM | POA: Diagnosis present

## 2015-06-06 DIAGNOSIS — E222 Syndrome of inappropriate secretion of antidiuretic hormone: Secondary | ICD-10-CM | POA: Diagnosis present

## 2015-06-06 DIAGNOSIS — E871 Hypo-osmolality and hyponatremia: Secondary | ICD-10-CM | POA: Diagnosis not present

## 2015-06-06 DIAGNOSIS — Z6841 Body Mass Index (BMI) 40.0 and over, adult: Secondary | ICD-10-CM | POA: Diagnosis not present

## 2015-06-06 DIAGNOSIS — R51 Headache: Secondary | ICD-10-CM

## 2015-06-06 DIAGNOSIS — J9610 Chronic respiratory failure, unspecified whether with hypoxia or hypercapnia: Secondary | ICD-10-CM | POA: Diagnosis present

## 2015-06-06 DIAGNOSIS — E785 Hyperlipidemia, unspecified: Secondary | ICD-10-CM | POA: Diagnosis not present

## 2015-06-06 DIAGNOSIS — D649 Anemia, unspecified: Secondary | ICD-10-CM | POA: Diagnosis present

## 2015-06-06 DIAGNOSIS — Z809 Family history of malignant neoplasm, unspecified: Secondary | ICD-10-CM | POA: Diagnosis not present

## 2015-06-06 DIAGNOSIS — R519 Headache, unspecified: Secondary | ICD-10-CM | POA: Insufficient documentation

## 2015-06-06 DIAGNOSIS — E1165 Type 2 diabetes mellitus with hyperglycemia: Secondary | ICD-10-CM | POA: Diagnosis not present

## 2015-06-06 DIAGNOSIS — R062 Wheezing: Secondary | ICD-10-CM | POA: Diagnosis present

## 2015-06-06 DIAGNOSIS — E669 Obesity, unspecified: Secondary | ICD-10-CM | POA: Diagnosis present

## 2015-06-06 LAB — BASIC METABOLIC PANEL
ANION GAP: 12 (ref 5–15)
ANION GAP: 10 (ref 5–15)
Anion gap: 14 (ref 5–15)
BUN: <5 mg/dL — ABNORMAL LOW (ref 6–20)
BUN: <5 mg/dL — ABNORMAL LOW (ref 6–20)
BUN: <5 mg/dL — ABNORMAL LOW (ref 6–20)
CALCIUM: 8.7 mg/dL — AB (ref 8.9–10.3)
CALCIUM: 8.5 mg/dL — AB (ref 8.9–10.3)
CALCIUM: 8.3 mg/dL — AB (ref 8.9–10.3)
CHLORIDE: 82 mmol/L — AB (ref 101–111)
CHLORIDE: 83 mmol/L — AB (ref 101–111)
CO2: 23 mmol/L (ref 22–32)
CO2: 21 mmol/L — AB (ref 22–32)
CO2: 21 mmol/L — AB (ref 22–32)
Chloride: 81 mmol/L — ABNORMAL LOW (ref 101–111)
Creatinine, Ser: 0.47 mg/dL (ref 0.44–1.00)
Creatinine, Ser: 0.53 mg/dL (ref 0.44–1.00)
Creatinine, Ser: 0.57 mg/dL (ref 0.44–1.00)
GFR calc Af Amer: >60 mL/min (ref >60)
GFR calc Af Amer: >60 mL/min (ref >60)
GFR calc non Af Amer: >60 mL/min (ref >60)
GFR calc non Af Amer: >60 mL/min (ref >60)
GLUCOSE: 145 mg/dL — AB (ref 65–99)
GLUCOSE: 168 mg/dL — AB (ref 65–99)
GLUCOSE: 156 mg/dL — AB (ref 65–99)
POTASSIUM: 3.8 mmol/L (ref 3.5–5.1)
POTASSIUM: 4.1 mmol/L (ref 3.5–5.1)
Potassium: 4.5 mmol/L (ref 3.5–5.1)
Sodium: 116 mmol/L — CL (ref 135–145)
Sodium: 117 mmol/L — CL (ref 135–145)
Sodium: 114 mmol/L — CL (ref 135–145)

## 2015-06-06 LAB — URINALYSIS, ROUTINE W REFLEX MICROSCOPIC
Bilirubin Urine: NEGATIVE
GLUCOSE, UA: NEGATIVE mg/dL
Hgb urine dipstick: NEGATIVE
Ketones, ur: NEGATIVE mg/dL
LEUKOCYTES UA: NEGATIVE
NITRITE: NEGATIVE
PH: 7 (ref 5.0–8.0)
Protein, ur: 30 mg/dL — AB
SPECIFIC GRAVITY, URINE: 1.015 (ref 1.005–1.030)
Urobilinogen, UA: 0.2 mg/dL (ref 0.0–1.0)

## 2015-06-06 LAB — HEPATIC FUNCTION PANEL
ALK PHOS: 91 U/L (ref 38–126)
ALT: 15 U/L (ref 14–54)
AST: 30 U/L (ref 15–41)
Albumin: 3.2 g/dL — ABNORMAL LOW (ref 3.5–5.0)
BILIRUBIN DIRECT: 0.2 mg/dL (ref 0.1–0.5)
BILIRUBIN TOTAL: 0.6 mg/dL (ref 0.3–1.2)
Indirect Bilirubin: 0.4 mg/dL (ref 0.3–0.9)
Total Protein: 5.9 g/dL — ABNORMAL LOW (ref 6.5–8.1)

## 2015-06-06 LAB — RAPID URINE DRUG SCREEN, HOSP PERFORMED
Amphetamines: NOT DETECTED
BARBITURATES: NOT DETECTED
Benzodiazepines: POSITIVE — AB
COCAINE: NOT DETECTED
Opiates: NOT DETECTED
Tetrahydrocannabinol: NOT DETECTED

## 2015-06-06 LAB — LIPASE, BLOOD: Lipase: 31 U/L (ref 11–51)

## 2015-06-06 LAB — CREATININE, URINE, RANDOM: CREATININE, URINE: 80.7 mg/dL

## 2015-06-06 LAB — CBC
HEMATOCRIT: 31.4 % — AB (ref 36.0–46.0)
HEMOGLOBIN: 11.2 g/dL — AB (ref 12.0–15.0)
MCH: 30.8 pg (ref 26.0–34.0)
MCHC: 35.7 g/dL (ref 30.0–36.0)
MCV: 86.3 fL (ref 78.0–100.0)
Platelets: 261 10*3/uL (ref 150–400)
RBC: 3.64 MIL/uL — AB (ref 3.87–5.11)
RDW: 12.9 % (ref 11.5–15.5)
WBC: 6.1 10*3/uL (ref 4.0–10.5)

## 2015-06-06 LAB — SODIUM, URINE, RANDOM: SODIUM UR: 118 mmol/L

## 2015-06-06 LAB — SODIUM: SODIUM: 116 mmol/L — AB (ref 135–145)

## 2015-06-06 LAB — GLUCOSE, CAPILLARY
GLUCOSE-CAPILLARY: 169 mg/dL — AB (ref 65–99)
Glucose-Capillary: 151 mg/dL — ABNORMAL HIGH (ref 65–99)
Glucose-Capillary: 174 mg/dL — ABNORMAL HIGH (ref 65–99)
Glucose-Capillary: 150 mg/dL — ABNORMAL HIGH (ref 65–99)

## 2015-06-06 LAB — MRSA PCR SCREENING: MRSA BY PCR: NEGATIVE

## 2015-06-06 LAB — OSMOLALITY: OSMOLALITY: 245 mosm/kg — AB (ref 275–300)

## 2015-06-06 LAB — URIC ACID: URIC ACID, SERUM: 2.9 mg/dL (ref 2.3–6.6)

## 2015-06-06 LAB — TSH: TSH: 1.344 u[IU]/mL (ref 0.350–4.500)

## 2015-06-06 LAB — URINE MICROSCOPIC-ADD ON

## 2015-06-06 LAB — CORTISOL-AM, BLOOD: Cortisol - AM: 8.1 ug/dL (ref 6.7–22.6)

## 2015-06-06 MED ORDER — LORAZEPAM 0.5 MG PO TABS
0.5000 mg | ORAL_TABLET | Freq: Three times a day (TID) | ORAL | Status: DC | PRN
  Administered 2015-06-06 – 2015-06-10 (×4): 0.5 mg via ORAL
  Filled 2015-06-06 (×6): qty 1

## 2015-06-06 MED ORDER — BUDESONIDE-FORMOTEROL FUMARATE 80-4.5 MCG/ACT IN AERO
2.0000 | INHALATION_SPRAY | Freq: Two times a day (BID) | RESPIRATORY_TRACT | Status: DC
  Administered 2015-06-06 – 2015-06-11 (×10): 2 via RESPIRATORY_TRACT
  Filled 2015-06-06 (×2): qty 6.9

## 2015-06-06 MED ORDER — KETOROLAC TROMETHAMINE 15 MG/ML IJ SOLN
15.0000 mg | Freq: Once | INTRAMUSCULAR | Status: AC
  Administered 2015-06-06: 15 mg via INTRAVENOUS
  Filled 2015-06-06: qty 1

## 2015-06-06 MED ORDER — GABAPENTIN 300 MG PO CAPS
600.0000 mg | ORAL_CAPSULE | Freq: Three times a day (TID) | ORAL | Status: DC
  Administered 2015-06-06 – 2015-06-11 (×17): 600 mg via ORAL
  Filled 2015-06-06 (×17): qty 2

## 2015-06-06 MED ORDER — LAMOTRIGINE 100 MG PO TABS: 100.0000 mg | ORAL_TABLET | Freq: Two times a day (BID) | ORAL | Status: DC

## 2015-06-06 MED ORDER — IPRATROPIUM-ALBUTEROL 0.5-2.5 (3) MG/3ML IN SOLN
3.0000 mL | Freq: Four times a day (QID) | RESPIRATORY_TRACT | Status: DC
  Administered 2015-06-06 – 2015-06-10 (×14): 3 mL via RESPIRATORY_TRACT
  Filled 2015-06-06 (×17): qty 3

## 2015-06-06 MED ORDER — INFLUENZA VAC SPLIT QUAD 0.5 ML IM SUSY
0.5000 mL | PREFILLED_SYRINGE | INTRAMUSCULAR | Status: AC
  Administered 2015-06-07: 0.5 mL via INTRAMUSCULAR
  Filled 2015-06-06 (×2): qty 0.5

## 2015-06-06 MED ORDER — INSULIN ASPART 100 UNIT/ML ~~LOC~~ SOLN: 0.0000 [IU] | Freq: Every day | SUBCUTANEOUS | Status: DC

## 2015-06-06 MED ORDER — LISINOPRIL 5 MG PO TABS
5.0000 mg | ORAL_TABLET | Freq: Every day | ORAL | Status: DC
  Administered 2015-06-06: 5 mg via ORAL
  Filled 2015-06-06: qty 1

## 2015-06-06 MED ORDER — SODIUM CHLORIDE 0.9 % IV SOLN
INTRAVENOUS | Status: DC
  Administered 2015-06-06 (×2): via INTRAVENOUS

## 2015-06-06 MED ORDER — TIOTROPIUM BROMIDE MONOHYDRATE 18 MCG IN CAPS
18.0000 ug | ORAL_CAPSULE | Freq: Every day | RESPIRATORY_TRACT | Status: DC
  Administered 2015-06-06: 18 ug via RESPIRATORY_TRACT
  Filled 2015-06-06: qty 5

## 2015-06-06 MED ORDER — HYDROCODONE-ACETAMINOPHEN 5-325 MG PO TABS
1.0000 | ORAL_TABLET | Freq: Four times a day (QID) | ORAL | Status: DC | PRN
  Administered 2015-06-06 – 2015-06-11 (×11): 1 via ORAL
  Filled 2015-06-06 (×12): qty 1

## 2015-06-06 MED ORDER — SIMVASTATIN 40 MG PO TABS
40.0000 mg | ORAL_TABLET | Freq: Every day | ORAL | Status: DC
  Administered 2015-06-06 – 2015-06-11 (×6): 40 mg via ORAL
  Filled 2015-06-06 (×6): qty 1

## 2015-06-06 MED ORDER — QUETIAPINE FUMARATE ER 400 MG PO TB24
800.0000 mg | ORAL_TABLET | Freq: Every day | ORAL | Status: DC
  Administered 2015-06-07 – 2015-06-10 (×5): 800 mg via ORAL
  Filled 2015-06-06 (×9): qty 2

## 2015-06-06 MED ORDER — ASPIRIN EC 81 MG PO TBEC
81.0000 mg | DELAYED_RELEASE_TABLET | Freq: Every day | ORAL | Status: DC
Start: 2015-06-06 — End: 2015-06-11
  Administered 2015-06-06 – 2015-06-11 (×6): 81 mg via ORAL
  Filled 2015-06-06 (×6): qty 1

## 2015-06-06 MED ORDER — METFORMIN HCL 500 MG PO TABS
1000.0000 mg | ORAL_TABLET | Freq: Two times a day (BID) | ORAL | Status: DC
  Administered 2015-06-06: 1000 mg via ORAL
  Filled 2015-06-06: qty 2

## 2015-06-06 MED ORDER — LAMOTRIGINE 25 MG PO TABS: 25.0000 mg | ORAL_TABLET | Freq: Two times a day (BID) | ORAL | Status: DC

## 2015-06-06 MED ORDER — LAMOTRIGINE 25 MG PO TABS
125.0000 mg | ORAL_TABLET | Freq: Two times a day (BID) | ORAL | Status: DC
  Administered 2015-06-06 – 2015-06-11 (×12): 125 mg via ORAL
  Filled 2015-06-06 (×4): qty 1
  Filled 2015-06-06: qty 5
  Filled 2015-06-06 (×2): qty 1
  Filled 2015-06-06: qty 5
  Filled 2015-06-06 (×6): qty 1

## 2015-06-06 MED ORDER — ACETAMINOPHEN 325 MG PO TABS
650.0000 mg | ORAL_TABLET | Freq: Four times a day (QID) | ORAL | Status: DC | PRN
  Administered 2015-06-06 (×2): 650 mg via ORAL
  Filled 2015-06-06 (×3): qty 2

## 2015-06-06 MED ORDER — ENOXAPARIN SODIUM 40 MG/0.4ML ~~LOC~~ SOLN
40.0000 mg | SUBCUTANEOUS | Status: DC
  Administered 2015-06-06 – 2015-06-11 (×6): 40 mg via SUBCUTANEOUS
  Filled 2015-06-06 (×6): qty 0.4

## 2015-06-06 MED ORDER — DULOXETINE HCL 60 MG PO CPEP
120.0000 mg | ORAL_CAPSULE | Freq: Every day | ORAL | Status: DC
  Administered 2015-06-06: 120 mg via ORAL
  Filled 2015-06-06: qty 2

## 2015-06-06 MED ORDER — INSULIN ASPART 100 UNIT/ML ~~LOC~~ SOLN
0.0000 [IU] | Freq: Three times a day (TID) | SUBCUTANEOUS | Status: DC
  Administered 2015-06-06: 1 [IU] via SUBCUTANEOUS
  Administered 2015-06-06 – 2015-06-07 (×3): 2 [IU] via SUBCUTANEOUS
  Administered 2015-06-07 – 2015-06-08 (×2): 3 [IU] via SUBCUTANEOUS
  Administered 2015-06-08: 2 [IU] via SUBCUTANEOUS

## 2015-06-06 MED ORDER — ZOLPIDEM TARTRATE 5 MG PO TABS
5.0000 mg | ORAL_TABLET | Freq: Every day | ORAL | Status: DC
  Administered 2015-06-06 – 2015-06-10 (×5): 5 mg via ORAL
  Filled 2015-06-06 (×5): qty 1

## 2015-06-06 NOTE — Progress Notes (Addendum)
PROGRESS NOTE  Sarah Leonard EXH:[id number] DOB: Nov 03, 1960 DOA: 06/05/2015 PCP: Moreen Fowler  Brief history 54 year old female with a history of COPD, chronic respiratory failure on 3 L nasal cannula, interstitial lung disease, diabetes mellitus type 2, bipolar disorder, anemia presented with cough, headache, and chest congestion. Apparently, the patient was seen in the emergency department on 05/20/2015. She left AGAINST MEDICAL ADVICE.  She was discharged with prednisone, amoxicillin, and albuterol inhaler. The patient states that she finished her antibiotics and prednisone 3 days prior to admission. Evaluation revealed serum sodium 117. The patient states that she has been on Cymbalta, Lamictal, and Seroquel for over one year. She denies any other new medications. She complained of loose stools that started on 06/03/2015, but has not had any stools in the past 24 hours. There is no hematochezia or melena. She has some dry heaves started on the same day, but she was able to eat her breakfast on the morning of 06/06/2015 without any difficulty. Assessment/Plan: Hyponatremia -Cymbalta, Lamictal, and Seroquel are likely contributing to her hyponatremia, but unclear as to the acute cause -Serum osmolarity 245 -Check UA, urine osmolarity, urine sodium, urine creatinine -Check TSH, and cortisol, although suspect and cortisol may be a little skewed from the patient's recent prednisone use -Continue normal saline -Repeat BMP every 4 hours -If no improvement or worsening, start hypertonic saline -uric acid -Discontinue lisinopril Intractable headache -Likely related to the patient's hyponatremia -CT brain negative Chronic respiratory failure/COPD -Presently stable on 3 L nasal cannula -Patient is normally on 3 L at home -Patient had 40-pack-year history of tobacco before quitting -She states that she has never seen a pulmonologist -Continue ymbicort and Spiriva -aerosolized  albuterol and atrovent -11/25/2014 CT of the chest showed interstitial lung disease Diabetes mellitus type 2 -Hemoglobin A1c -NovoLog sliding scale -Discontinue metformin for now Bipolar disorder -Continue Cymbalta, Lamictal, Seroquel for now History of Cocaine use -check UDS  Family Communication:   Pt at beside Disposition Plan:   Home when medically stable Total time 60 min--0825-0925      Procedures/Studies: Dg Chest 2 View  06/05/2015  CLINICAL DATA:  Subacute onset of shortness of breath, cough and headaches. Initial encounter. EXAM: CHEST  2 VIEW COMPARISON:  Chest radiograph performed 05/20/2015, and CTA of the chest performed 11/25/2014 FINDINGS: The lungs are well-aerated. Vascular congestion is noted. Diffuse interstitial opacities are slightly less well characterized due to motion artifact, but appear relatively similar to the prior study, and worsened from April. This is concerning for worsening chronic interstitial lung disease, though superimposed pneumonia cannot be excluded. The heart is borderline normal in size. No acute osseous abnormalities are seen. IMPRESSION: Vascular congestion noted. Diffuse interstitial opacities are slightly less well characterized due to motion artifact, but appear relatively similar to the prior study, and worsened from April. This is concerning for worsening chronic interstitial lung disease, though superimposed pneumonia cannot be excluded. Electronically Signed   By: Garald Balding M.D.   On: 06/05/2015 21:19   Dg Chest 2 View  05/20/2015  CLINICAL DATA:  Productive cough.  Fatigue.  Three days duration. EXAM: CHEST  2 VIEW COMPARISON:  11/24/2014 FINDINGS: Extensive nodular infiltrates throughout both lungs appear worsened from 11/24/2014. This could be infectious or inflammatory. Neoplasm cannot be excluded. Chronic interstitial disease may also be a factor. No effusions are evident. Hilar and mediastinal contours are unremarkable.  IMPRESSION: Diffuse nodularity throughout both lungs, indeterminate with regard to  infectious, inflammatory or neoplastic etiology. This may also relate to a chronic interstitial disease but it is worsened from 11/24/2014. Electronically Signed   By: Andreas Newport M.D.   On: 05/20/2015 01:17   Ct Head Wo Contrast  06/06/2015  CLINICAL DATA:  Multifocal headache for 3 days. History of diabetes, hypertension, substance abuse. EXAM: CT HEAD WITHOUT CONTRAST TECHNIQUE: Contiguous axial images were obtained from the base of the skull through the vertex without intravenous contrast. COMPARISON:  CT head November 25, 2014 FINDINGS: The ventricles and sulci are normal. No intraparenchymal hemorrhage, mass effect nor midline shift. No acute large vascular territory infarcts. Patchy supratentorial white matter hypodensities, advanced for age though, similar to prior imaging. No abnormal extra-axial fluid collections. Basal cisterns are patent. No skull fracture. The included ocular globes and orbital contents are non-suspicious. Partially imaged LEFT maxillary mucosal retention cyst, with improved aeration of the paranasal sinuses from prior imaging. Small chronic LEFT mastoid effusion without air cell coalescence. Soft tissue within the RIGHT external auditory canal most compatible with cerumen. IMPRESSION: No acute intracranial process. Stable mild white matter changes most compatible with chronic small vessel ischemic disease, advanced for age. Electronically Signed   By: Elon Alas M.D.   On: 06/06/2015 06:09         Subjective: Pt c/o of headache.  Patient denies fevers, chills, headache, chest pain, dyspnea, nausea, vomiting, diarrhea, abdominal pain, dysuria, hematuria   Objective: Filed Vitals:   06/06/15 0145 06/06/15 0315 06/06/15 0438 06/06/15 0819  BP: 125/74 132/79 128/68   Pulse: 77 74 81   Temp:  97.6 F (36.4 C) 98.7 F (37.1 C)   TempSrc:  Oral Oral   Resp: '15 19 18   '$ Height:   '5\' 3"'$  (1.6 m)    Weight:  105.008 kg (231 lb 8 oz)    SpO2: 100% 94% 96% 97%    Intake/Output Summary (Last 24 hours) at 06/06/15 0908 Last data filed at 06/06/15 0606  Gross per 24 hour  Intake      0 ml  Output      1 ml  Net     -1 ml   Weight change:  Exam:   General:  Pt is alert, follows commands appropriately, not in acute distress  HEENT: No icterus, No thrush, No neck mass, [place]/AT  Cardiovascular: RRR, S1/S2, no rubs, no gallops  Respiratory: bibasilar rales and mild wheeze  Abdomen: Soft/+BS, epigastric tender without rebound, non distended, no guarding  Extremities: 1 +LE edema, No lymphangitis, No petechiae, No rashes, no synovitis  Data Reviewed: Basic Metabolic Panel:  Recent Labs Lab 06/05/15 2050 06/06/15 0424 06/06/15 0738  NA 117* 116* 116*  K 4.0 3.8  --   CL 82* 81*  --   CO2 21* 23  --   GLUCOSE 136* 145*  --   BUN <5* <5*  --   CREATININE 0.38* 0.47  --   CALCIUM 8.9 8.7*  --    Liver Function Tests: No results for input(s): AST, ALT, ALKPHOS, BILITOT, PROT, ALBUMIN in the last 168 hours. No results for input(s): LIPASE, AMYLASE in the last 168 hours. No results for input(s): AMMONIA in the last 168 hours. CBC:  Recent Labs Lab 06/05/15 2050 06/06/15 0424  WBC 7.3 6.1  HGB 11.6* 11.2*  HCT 32.9* 31.4*  MCV 85.0 86.3  PLT 312 261   Cardiac Enzymes:  Recent Labs Lab 06/05/15 2050  TROPONINI <0.03   BNP: Invalid input(s): POCBNP CBG:  Recent Labs Lab 06/05/15 2159 06/06/15 0245  GLUCAP 120* 151*    Recent Results (from the past 240 hour(s))  MRSA PCR Screening     Status: None   Collection Time: 06/06/15  3:20 AM  Result Value Ref Range Status   MRSA by PCR NEGATIVE NEGATIVE Final    Comment:        The GeneXpert MRSA Assay (FDA approved for NASAL specimens only), is one component of a comprehensive MRSA colonization surveillance program. It is not intended to diagnose MRSA infection nor to guide or monitor  treatment for MRSA infections.      Scheduled Meds: . aspirin EC  81 mg Oral Daily  . budesonide-formoterol  2 puff Inhalation BID  . DULoxetine  120 mg Oral Daily  . gabapentin  600 mg Oral TID  . [START ON 06/07/2015] Influenza vac split quadrivalent PF  0.5 mL Intramuscular Tomorrow-1000  . insulin aspart  0-5 Units Subcutaneous QHS  . insulin aspart  0-9 Units Subcutaneous TID WC  . lamoTRIgine  125 mg Oral BID  . QUEtiapine  800 mg Oral QHS  . simvastatin  40 mg Oral q1800  . tiotropium  18 mcg Inhalation Daily  . zolpidem  5 mg Oral QHS   Continuous Infusions: . sodium chloride 75 mL/hr at 06/06/15 0848     Sarah Yim, DO  [facility] Pager [phone]  If 7PM-7AM, please contact night-coverage [email or web address] Password [email or web address] 06/06/2015, 9:08 AM

## 2015-06-06 NOTE — Progress Notes (Signed)
Notified by RN that pt having excruciating headache and crying.  I went to evaluate patient.  Upon walking in room, pt was sleeping!  Upon waking up, pt stated pain in head was 10/10.  No visual loss or focal extremity weakness.  I told the patient, that I will not be giving any IV opioids.  Only hydrocodone po will be provided for severe pain.  Pt expressed understanding.  DTat

## 2015-06-06 NOTE — ED Notes (Signed)
Admitting MD at the bedside.  

## 2015-06-06 NOTE — Progress Notes (Signed)
CRITICAL VALUE ALERT  Critical value received:  Sodium 116  Date of notification:  06/06/2015  Time of notification:  0814  Critical value read back:Yes.    Nurse who received alert:  Maggie Font RN   MD notified (1st page):  Shanon Brow Tat  Time of first page:  0815  Responding MD:  Orson Eva, MD is on the floor at the time  Time MD responded:  0825  Comments: Patient started on fluids per MD orders

## 2015-06-06 NOTE — Progress Notes (Signed)
CRITICAL VALUE ALERT  Critical value received:   Na 116  Date of notification:   06/06/15  Time of notification:  6:15am  Critical value read back: Yes Nurse who received alert: Winnona Wargo RN  MD notified (1st page): Baltazar Najjar NP  Time of first page:  6:28am  MD notified (2nd page):  Time of second page:  Responding MD:  Time MD responded:

## 2015-06-06 NOTE — H&P (Signed)
History and Physical  Patient Name: Sarah Leonard     NWG:[id number]    DOB: [birth year]    DOA: 06/05/2015 Referring physician: Lajean Saver, MD PCP: Moreen Fowler      Chief Complaint: Headache  HPI: Sarah Leonard is a 54 y.o. female with a past medical history significant for COPD on home O2, undifferentiated interstitial lung disease, NIDDM, bipolar depression and chronic normocytic anemia who presents with headache and worsening hyponatremia.  The patient first developed headache with worsening chest congestion and cough about 3 weeks ago. She was seen in the ER diagnosed with COPD exacerbation and left AMA with prednisone and amoxicillin and refill of her albuterol.  She finished the steroid and antibiotic several days ago, but notes that her headaches persist, are severe and "pounding", and keep her awake at night.  They're similar to her previous migraines, but more persistent. She also notes nausea and NBNB vomiting, generalized weakness, diarrhea, malaise, persistent wheezing and chest congestion.  In the ED, the patient was hemodynamically stable but was noted to have a sodium down to 117 mmol per liter.  TRH was asked to admit for observation of headache and hyponatremia.     Review of Systems:  All other systems negative except as just noted or noted in the history of present illness.  No Known Allergies  Prior to Admission medications   Medication Sig Start Date End Date Taking? Authorizing Provider  acetaminophen (TYLENOL) 325 MG tablet Take 325 mg by mouth 3 (three) times daily. Prior to fish oil   Yes Historical Provider, MD  albuterol (PROVENTIL HFA;VENTOLIN HFA) 108 (90 BASE) MCG/ACT inhaler Inhale 2 puffs into the lungs every 4 (four) hours as needed for wheezing or shortness of breath. 57/8/46  Yes Delora Fuel, MD  aspirin EC 81 MG tablet Take 81 mg by mouth daily.   Yes Historical Provider, MD  budesonide-formoterol (SYMBICORT) 80-4.5 MCG/ACT inhaler  Inhale 2 puffs into the lungs 2 (two) times daily.   Yes Historical Provider, MD  calcium-vitamin D (OSCAL WITH D) 500-200 MG-UNIT tablet Take 1 tablet by mouth 2 (two) times daily.   Yes Historical Provider, MD  cholecalciferol (VITAMIN D) 1000 UNITS tablet Take 1,000 Units by mouth daily.   Yes Historical Provider, MD  docusate sodium (COLACE) 100 MG capsule Take 100 mg by mouth 2 (two) times daily.   Yes Historical Provider, MD  DULoxetine (CYMBALTA) 60 MG capsule Take 120 mg by mouth daily.    Yes Historical Provider, MD  gabapentin (NEURONTIN) 300 MG capsule Take 600 mg by mouth 3 (three) times daily.   Yes Historical Provider, MD  guaifenesin (ROBITUSSIN) 100 MG/5ML syrup Take 200 mg by mouth every 4 (four) hours as needed for cough.   Yes Historical Provider, MD  lamoTRIgine (LAMICTAL) 100 MG tablet Take 100 mg by mouth 2 (two) times daily. Takes with 25 mg tab   Yes Historical Provider, MD  lamoTRIgine (LAMICTAL) 25 MG tablet Take 25 mg by mouth 2 (two) times daily. Takes with '100mg'$  tablet   Yes Historical Provider, MD  lisinopril (PRINIVIL,ZESTRIL) 5 MG tablet Take 5 mg by mouth daily.   Yes Historical Provider, MD  LORazepam (ATIVAN) 0.5 MG tablet Take 1 tablet (0.5 mg total) by mouth every 8 (eight) hours as needed for anxiety. 11/29/14  Yes Nishant Dhungel, MD  metFORMIN (GLUCOPHAGE) 1000 MG tablet Take 1 tablet (1,000 mg total) by mouth 2 (two) times daily with a meal. 11/29/14  Yes Nishant  Dhungel, MD  niacin (NIASPAN) 750 MG CR tablet Take 750 mg by mouth at bedtime.   Yes Historical Provider, MD  omeprazole (PRILOSEC) 20 MG capsule Take 20 mg by mouth daily before supper.    Yes Historical Provider, MD  promethazine (PHENERGAN) 12.5 MG tablet Take 12.5 mg by mouth every 6 (six) hours as needed for nausea or vomiting.   Yes Historical Provider, MD  QUEtiapine (SEROQUEL XR) 400 MG 24 hr tablet Take 800 mg by mouth at bedtime.   Yes Historical Provider, MD  simvastatin (ZOCOR) 40 MG tablet  Take 40 mg by mouth daily at 6 PM.    Yes Historical Provider, MD  tiotropium (SPIRIVA) 18 MCG inhalation capsule Place 18 mcg into inhaler and inhale daily.   Yes Historical Provider, MD  zolpidem (AMBIEN) 10 MG tablet Take 5 mg by mouth at bedtime.    Yes Historical Provider, MD  amoxicillin (AMOXIL) 500 MG capsule Take 2 capsules (1,000 mg total) by mouth 2 (two) times daily. 95/6/21   Delora Fuel, MD  predniSONE (DELTASONE) 50 MG tablet Take 1 tablet (50 mg total) by mouth daily. Patient not taking: Reported on 06/05/2015 30/8/65   Delora Fuel, MD    Past Medical History  Diagnosis Date  . Diabetes mellitus   . Pancreatitis   . Anemia   . Bipolar 1 disorder ([place])   . Alcohol abuse   . 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 COPD (chronic obstructive pulmonary disease) ([place])   . Hypotension   . Major depressive disorder ([place])   . Cocaine abuse   . Chronic back pain   . Renal disorder   . Acidosis   . Hyperkalemia   . Abscess   . Acute encephalopathy   . Septic shock [facility])     Past Surgical History  Procedure Laterality Date  . Cholecystectomy    . Appendectomy    . Tonsillectomy    . Incision and drainage abscess Right 03/01/2013    Procedure: INCISION AND DRAINAGE ABSCESS;  Surgeon: Harl Bowie, MD;  Location: [place];  Service: General;  Laterality: Right;  . Cesarean section      Family history: family history includes Cancer in her mother; Diabetes in her mother.  Social History: Patient lives in an assisted living facility. She is independent with all ADLs. She is a current smoker.       Physical Exam: BP 130/84 mmHg  Pulse 79  Temp(Src) 98.7 F (37.1 C) (Rectal)  Resp 15  SpO2 98% General appearance: Well-developed, obese adult female, alert and in no acute distress.   Eyes: Anicteric, conjunctiva pink, lids and lashes normal.     ENT: No nasal deformity, discharge, or epistaxis.  OP moist without lesions.   Skin: Warm and dry.   No jaundice.  Faint red macules on left dorsal hand, no other suspicious rashes on face, neck, chest, abdomen or extremities. Cardiac: RRR, nl S1-S2, no murmurs appreciated.  Capillary refill is brisk.  JVP not visible.  No LE edema.   Respiratory: Normal respiratory rate and rhythm.  Diffuse inspiratory and expiratory wheezing. Abdomen: Abdomen soft without rigidity.  No TTP. No ascites, distension.   MSK: No deformities or effusions. Neuro: Sensorium intact and responding appropriately to questions, attention normal.  Oriented to person place and situation. Speech is fluent.  Moves all extremities equally and with normal coordination.  Walks forward and backward without difficulty, shuffling.  Psych: Behavior appropriate.  Affect normal.  No evidence of aural or visual hallucinations or delusions.       Labs on Admission:  The metabolic panel shows hyponatremia. Troponin level and BNP are normal.  The complete blood count shows chronic normocytic anemia.   Radiological Exams on Admission: Personally reviewed: Dg Chest 2 View 06/05/2015  Bilateral lower lobe opacities, stable from previous.    EKG: Independently reviewed. NSR.    Assessment/Plan  1. Hyponatremia:  This is worse than baseline, near 130 mmol/L.  The chest x-ray shows no definite nodules.  The patient takes duloxetine, as well as quetiapine and lamotrigine, which could contribute to hyponatremia. -Check urine and serum osmolality.   -Repeat BMP  2. Headache:  This is new.  This seems to be worse than previous headaches and is associated with vomiting and is present at night. -CT head noncontrast  3. DM2:  Glucose normal at admission. -Continue metformin  4. Anemia, chronic normocytic:  Stable.  5. COPD:  The patient is wheezy on exam but not requiring more oxygen than usual and his just been treated with amoxicillin and steroids and I do not gather from her that she has clinically far from her  baseline.  The patient hasn't had chest x-ray. This seems to date back to around 2005 when she had respiratory failure in the setting of pancreatitis and needed to be on a ventilator and a CT at this time showed bilateral interstitial opacities. CT angiogram several months ago showed persistent interstitial disease and this seems to be what is on her chest x-ray. She is not able to tell me that she has anything other than COPD. -Continue tiotropium and Symbicort  6. HTN:  Stable.  -Continue lisinopril and statin  7. Bipolar depression:  Stable.  -Continue quetiapine, duloxetine, and lamotrigine     DVT PPx: Low risk Diet: Regular Code Status: Full Medical decision making: What exists of the patient's previous chart was reviewed in depth and the case was discussed with Dr. Ashok Cordia. Patient seen 1:12 AM on 06/06/2015.  Disposition Plan:  CT head and osmolality studies.  Repeat BMP after fluid resuscitation.  If sodium corrects and studies normal, discharge after observation period.  At the point of initial evaluation, it is my clinical opinion that admission for OBSERVATION is reasonable and necessary because the patient's presenting complaints in the context of their chronic conditions represent sufficient risk of deterioration or significant morbidity to constitute reasonable grounds for close observation in the hospital setting, but that the patient may be medically stable for discharge from the hospital within 24 to 48 hours.        Edwin Dada [facility] Pager [phone]

## 2015-06-06 NOTE — Progress Notes (Signed)
Report called to the ED.

## 2015-06-06 NOTE — Progress Notes (Signed)
New Admission Note  Arrival: Arrived via stretcher Mental Orientation: Alert & oriented x4 Telemetry: Assessment: Non tele Skin: No Skin issues verified with 2nd RN [place] IV:  Right AC saline locked Pain: No c/o of pain Safety measures:  verbalized understanding. Bed in lowest position. Yellow bracelet on. Non-skid socks on. Bed alarm on. Family:   Orders have been reviewed and implemented. Will continue to monitor.

## 2015-06-07 DIAGNOSIS — E785 Hyperlipidemia, unspecified: Secondary | ICD-10-CM

## 2015-06-07 DIAGNOSIS — Z720 Tobacco use: Secondary | ICD-10-CM

## 2015-06-07 DIAGNOSIS — E1165 Type 2 diabetes mellitus with hyperglycemia: Secondary | ICD-10-CM

## 2015-06-07 LAB — BASIC METABOLIC PANEL
Anion gap: 10 (ref 5–15)
Anion gap: 10 (ref 5–15)
Anion gap: 10 (ref 5–15)
Anion gap: 11 (ref 5–15)
Anion gap: 13 (ref 5–15)
CHLORIDE: 80 mmol/L — AB (ref 101–111)
CHLORIDE: 82 mmol/L — AB (ref 101–111)
CHLORIDE: 84 mmol/L — AB (ref 101–111)
CHLORIDE: 80 mmol/L — AB (ref 101–111)
CHLORIDE: 79 mmol/L — AB (ref 101–111)
CO2: 21 mmol/L — ABNORMAL LOW (ref 22–32)
CO2: 25 mmol/L (ref 22–32)
CO2: 26 mmol/L (ref 22–32)
CO2: 26 mmol/L (ref 22–32)
CO2: 26 mmol/L (ref 22–32)
CREATININE: 0.39 mg/dL — AB (ref 0.44–1.00)
CREATININE: 0.42 mg/dL — AB (ref 0.44–1.00)
CREATININE: 0.45 mg/dL (ref 0.44–1.00)
Calcium: 7.5 mg/dL — ABNORMAL LOW (ref 8.9–10.3)
Calcium: 8 mg/dL — ABNORMAL LOW (ref 8.9–10.3)
Calcium: 8.1 mg/dL — ABNORMAL LOW (ref 8.9–10.3)
Calcium: 8.3 mg/dL — ABNORMAL LOW (ref 8.9–10.3)
Calcium: 8.5 mg/dL — ABNORMAL LOW (ref 8.9–10.3)
Creatinine, Ser: 0.42 mg/dL — ABNORMAL LOW (ref 0.44–1.00)
Creatinine, Ser: 0.46 mg/dL (ref 0.44–1.00)
GFR calc Af Amer: >60 mL/min (ref >60)
GFR calc Af Amer: >60 mL/min (ref >60)
GFR calc Af Amer: >60 mL/min (ref >60)
GFR calc Af Amer: >60 mL/min (ref >60)
GFR calc Af Amer: >60 mL/min (ref >60)
GFR calc non Af Amer: >60 mL/min (ref >60)
GFR calc non Af Amer: >60 mL/min (ref >60)
GFR calc non Af Amer: >60 mL/min (ref >60)
GFR calc non Af Amer: >60 mL/min (ref >60)
GLUCOSE: 171 mg/dL — AB (ref 65–99)
GLUCOSE: 187 mg/dL — AB (ref 65–99)
GLUCOSE: 238 mg/dL — AB (ref 65–99)
GLUCOSE: 161 mg/dL — AB (ref 65–99)
GLUCOSE: 151 mg/dL — AB (ref 65–99)
POTASSIUM: 3.6 mmol/L (ref 3.5–5.1)
POTASSIUM: 3.6 mmol/L (ref 3.5–5.1)
POTASSIUM: 3.8 mmol/L (ref 3.5–5.1)
POTASSIUM: 4.8 mmol/L (ref 3.5–5.1)
POTASSIUM: 4.3 mmol/L (ref 3.5–5.1)
Sodium: 114 mmol/L — CL (ref 135–145)
Sodium: 115 mmol/L — CL (ref 135–145)
Sodium: 116 mmol/L — CL (ref 135–145)
Sodium: 118 mmol/L — CL (ref 135–145)
Sodium: 120 mmol/L — ABNORMAL LOW (ref 135–145)

## 2015-06-07 LAB — LIPID PANEL
CHOL/HDL RATIO: 2.3 ratio
CHOLESTEROL: 116 mg/dL (ref 0–200)
HDL: 50 mg/dL (ref >40)
LDL CALC: 35 mg/dL (ref 0–99)
Triglycerides: 155 mg/dL — ABNORMAL HIGH (ref <150)
VLDL: 31 mg/dL (ref 0–40)

## 2015-06-07 LAB — GLUCOSE, CAPILLARY
GLUCOSE-CAPILLARY: 167 mg/dL — AB (ref 65–99)
Glucose-Capillary: 216 mg/dL — ABNORMAL HIGH (ref 65–99)
Glucose-Capillary: 200 mg/dL — ABNORMAL HIGH (ref 65–99)
Glucose-Capillary: 190 mg/dL — ABNORMAL HIGH (ref 65–99)

## 2015-06-07 LAB — HEMOGLOBIN A1C
Hgb A1c MFr Bld: 10.4 % — ABNORMAL HIGH (ref 4.8–5.6)
Mean Plasma Glucose: 252 mg/dL

## 2015-06-07 LAB — CORTISOL-AM, BLOOD: Cortisol - AM: 10.6 ug/dL (ref 6.7–22.6)

## 2015-06-07 LAB — OSMOLALITY, URINE: Osmolality, Ur: 482 mOsm/kg (ref 390–1090)

## 2015-06-07 MED ORDER — SODIUM CHLORIDE 3 % IV SOLN
INTRAVENOUS | Status: DC
  Administered 2015-06-07 – 2015-06-08 (×2): 20 mL/h via INTRAVENOUS
  Administered 2015-06-09: 25 mL/h via INTRAVENOUS
  Filled 2015-06-07 (×4): qty 500

## 2015-06-07 NOTE — Progress Notes (Signed)
Patient transferred to [facility] as she needs close monitoring with hypertonic infusion (3% Nacl).  Given report to Doran at [facility] and also tubed all her meds.

## 2015-06-07 NOTE — Progress Notes (Signed)
CRITICAL VALUE ALERT  Critical value received:  Na+ 118  Date of notification: 06/07/2015  Time of notification:  1906  Critical value read back: yes  Nurse who received alert:  Eulis Foster RN  MD notified (1st page):  K.Schorr  Time of first page:    MD notified (2nd page):  Time of second page:  Responding MD:  Pt on 3% Nacl '@20'$  ml/hr   Time MD responded:

## 2015-06-07 NOTE — Progress Notes (Signed)
Results for SHAUNTEA, LOK (MRN [id number]) as of 06/07/2015 01:38  Ref. Range 06/06/2015 23:50  Sodium Latest Ref Range: 135-145 mmol/L 114 (LL)  NP Kirby notified.

## 2015-06-07 NOTE — Progress Notes (Signed)
Results for Sarah Leonard, Sarah Leonard (MRN [id number]) as of 06/07/2015 07:33  Ref. Range 06/07/2015 03:43  Sodium Latest Ref Range: 135-145 mmol/L 115 (LL)  Notified MD Tat at bedside.

## 2015-06-07 NOTE — Progress Notes (Signed)
CRITICAL VALUE ALERT  Critical value received:  Na 116  Date of notification:  06/07/2015  Time of notification:  1312  Critical value read back:Yes.    Nurse who received alert:  Brien Mates, RN  MD notified (1st page):  Dr. Carles Collet  Time of first page:  1326  MD notified (2nd page):  Time of second page:  Responding MD:    Time MD responded:  [place]anyon Frankowski, RN

## 2015-06-07 NOTE — Progress Notes (Signed)
PROGRESS NOTE  Sarah Leonard JIR:[id number] DOB: Jan 31, 1961 DOA: 06/05/2015 PCP: Moreen Fowler  Brief history 54 year old female with a history of COPD, chronic respiratory failure on 3 L nasal cannula, interstitial lung disease, diabetes mellitus type 2, bipolar disorder, anemia presented with cough, headache, and chest congestion. Apparently, the patient was seen in the emergency department on 05/20/2015. She left AGAINST MEDICAL ADVICE. She was discharged with prednisone, amoxicillin, and albuterol inhaler. The patient states that she finished her antibiotics and prednisone 3 days prior to admission. Evaluation revealed serum sodium 117. The patient states that she has been on Cymbalta, Lamictal, and Seroquel for over one year. She denies any other new medications. She complained of loose stools that started on 06/03/2015, but has not had any stools in the past 24 hours. There is no hematochezia or melena. She has some dry heaves started on the same day, but she was able to eat her breakfast on the morning of 06/06/2015 without any difficulty. Assessment/Plan: Hyponatremia/SIADH -Cymbalta, Lamictal, and Seroquel are likely contributing to her hyponatremia, but unclear as to the acute cause -likely SIADH -Serum osmolarity 245; urine osmolarity 482; uric acid 2.9 -Check UA sp grav 1.015 -Check TSH--1.344 -am cortisol--10.6 -discontinue normal saline -start hypertonic saline@ 20cc/hr; place PICC line -fluid restrict 1000cc every 24 hours -Repeat BMP every 4 hours -Discontinue lisinopril and cymbalta Intractable headache -Likely related to the patient's hyponatremia -CT brain negative -pt has some opioid seeking behavior -pt usually sleeping when I enter room on multiple days -no IV opioids will be given Chronic respiratory failure/COPD -Presently stable on 3 L nasal cannula -Patient is normally on 3 L at home -Patient had 40-pack-year history of tobacco before  quitting -She states that she has never seen a pulmonologist -Continue symbicort  -Duonebs q 6 hrs -aerosolized albuterol and atrovent -11/25/2014 CT of the chest showed interstitial lung disease Diabetes mellitus type 2 -Hemoglobin A1c--10.4 -NovoLog sliding scale -Discontinue metformin for now Bipolar disorder -Continue  Lamictal, Seroquel for now History of Cocaine use -check UDS--positive benzo only  Family Communication: Pt at beside Disposition Plan: Home when medically stable    Procedures/Studies: Dg Chest 2 View  06/05/2015  CLINICAL DATA:  Subacute onset of shortness of breath, cough and headaches. Initial encounter. EXAM: CHEST  2 VIEW COMPARISON:  Chest radiograph performed 05/20/2015, and CTA of the chest performed 11/25/2014 FINDINGS: The lungs are well-aerated. Vascular congestion is noted. Diffuse interstitial opacities are slightly less well characterized due to motion artifact, but appear relatively similar to the prior study, and worsened from April. This is concerning for worsening chronic interstitial lung disease, though superimposed pneumonia cannot be excluded. The heart is borderline normal in size. No acute osseous abnormalities are seen. IMPRESSION: Vascular congestion noted. Diffuse interstitial opacities are slightly less well characterized due to motion artifact, but appear relatively similar to the prior study, and worsened from April. This is concerning for worsening chronic interstitial lung disease, though superimposed pneumonia cannot be excluded. Electronically Signed   By: Garald Balding M.D.   On: 06/05/2015 21:19   Dg Chest 2 View  05/20/2015  CLINICAL DATA:  Productive cough.  Fatigue.  Three days duration. EXAM: CHEST  2 VIEW COMPARISON:  11/24/2014 FINDINGS: Extensive nodular infiltrates throughout both lungs appear worsened from 11/24/2014. This could be infectious or inflammatory. Neoplasm cannot be excluded. Chronic interstitial disease may  also be a factor. No effusions are evident. Hilar and mediastinal contours are  unremarkable. IMPRESSION: Diffuse nodularity throughout both lungs, indeterminate with regard to infectious, inflammatory or neoplastic etiology. This may also relate to a chronic interstitial disease but it is worsened from 11/24/2014. Electronically Signed   By: Andreas Newport M.D.   On: 05/20/2015 01:17   Ct Head Wo Contrast  06/06/2015  CLINICAL DATA:  Multifocal headache for 3 days. History of diabetes, hypertension, substance abuse. EXAM: CT HEAD WITHOUT CONTRAST TECHNIQUE: Contiguous axial images were obtained from the base of the skull through the vertex without intravenous contrast. COMPARISON:  CT head November 25, 2014 FINDINGS: The ventricles and sulci are normal. No intraparenchymal hemorrhage, mass effect nor midline shift. No acute large vascular territory infarcts. Patchy supratentorial white matter hypodensities, advanced for age though, similar to prior imaging. No abnormal extra-axial fluid collections. Basal cisterns are patent. No skull fracture. The included ocular globes and orbital contents are non-suspicious. Partially imaged LEFT maxillary mucosal retention cyst, with improved aeration of the paranasal sinuses from prior imaging. Small chronic LEFT mastoid effusion without air cell coalescence. Soft tissue within the RIGHT external auditory canal most compatible with cerumen. IMPRESSION: No acute intracranial process. Stable mild white matter changes most compatible with chronic small vessel ischemic disease, advanced for age. Electronically Signed   By: Elon Alas M.D.   On: 06/06/2015 06:09         Subjective: Pt continues to have frontal HA without visual disturbance or focal extremity weakness.  Denies any fevers, chest, chest pain shortness breath, vomiting, vomiting, diarrhea, dysuria, hematuria. No rashes.  Objective: Filed Vitals:   06/06/15 1715 06/06/15 2014 06/06/15 2104  06/07/15 0545  BP: 156/90  144/99 129/76  Pulse: 82  82 82  Temp: 98 F (36.7 C)  98.1 F (36.7 C) 97.9 F (36.6 C)  TempSrc: Oral  Oral Oral  Resp:   20 18  Height:      Weight:      SpO2: 96% 95% 100% 97%    Intake/Output Summary (Last 24 hours) at 06/07/15 0743 Last data filed at 06/07/15 0608  Gross per 24 hour  Intake   3290 ml  Output    850 ml  Net   2440 ml   Weight change:  Exam:   General:  Pt is alert, follows commands appropriately, not in acute distress  HEENT: No icterus, No thrush, No neck mass, [place]/AT  Cardiovascular: RRR, S1/S2, no rubs, no gallops  Respiratory: Bibasilar rales with expiratory wheeze. Good air movement.  Abdomen: Soft/+BS, epigastric tenderness without any rebound or guarding., non distended, no guarding  Extremities: 1 + LE edema, No lymphangitis, No petechiae, No rashes, no synovitis  Data Reviewed: Basic Metabolic Panel:  Recent Labs Lab 06/06/15 0424 06/06/15 0738 06/06/15 1105 06/06/15 1830 06/06/15 2350 06/07/15 0343  NA 116* 116* 117* 114* 114* 115*  K 3.8  --  4.5 4.1 4.8 4.3  CL 81*  --  82* 83* 80* 79*  CO2 23  --  21* 21* 21* 25  GLUCOSE 145*  --  168* 156* 161* 151*  BUN <5*  --  <5* <5* <5* <5*  CREATININE 0.47  --  0.53 0.57 0.45 0.42*  CALCIUM 8.7*  --  8.5* 8.3* 8.5* 8.3*   Liver Function Tests:  Recent Labs Lab 06/06/15 1105  AST 30  ALT 15  ALKPHOS 91  BILITOT 0.6  PROT 5.9*  ALBUMIN 3.2*    Recent Labs Lab 06/06/15 1105  LIPASE 31   No results for input(s): AMMONIA  in the last 168 hours. CBC:  Recent Labs Lab 06/05/15 2050 06/06/15 0424  WBC 7.3 6.1  HGB 11.6* 11.2*  HCT 32.9* 31.4*  MCV 85.0 86.3  PLT 312 261   Cardiac Enzymes:  Recent Labs Lab 06/05/15 2050  TROPONINI <0.03   BNP: Invalid input(s): POCBNP CBG:  Recent Labs Lab 06/06/15 0245 06/06/15 1130 06/06/15 1714 06/06/15 2107 06/07/15 0732  GLUCAP 151* 174* 150* 169* 167*    Recent Results (from the  past 240 hour(s))  MRSA PCR Screening     Status: None   Collection Time: 06/06/15  3:20 AM  Result Value Ref Range Status   MRSA by PCR NEGATIVE NEGATIVE Final    Comment:        The GeneXpert MRSA Assay (FDA approved for NASAL specimens only), is one component of a comprehensive MRSA colonization surveillance program. It is not intended to diagnose MRSA infection nor to guide or monitor treatment for MRSA infections.      Scheduled Meds: . aspirin EC  81 mg Oral Daily  . budesonide-formoterol  2 puff Inhalation BID  . enoxaparin (LOVENOX) injection  40 mg Subcutaneous Q24H  . gabapentin  600 mg Oral TID  . Influenza vac split quadrivalent PF  0.5 mL Intramuscular Tomorrow-1000  . insulin aspart  0-5 Units Subcutaneous QHS  . insulin aspart  0-9 Units Subcutaneous TID WC  . ipratropium-albuterol  3 mL Nebulization Q6H  . lamoTRIgine  125 mg Oral BID  . QUEtiapine  800 mg Oral QHS  . simvastatin  40 mg Oral q1800  . zolpidem  5 mg Oral QHS   Continuous Infusions: . sodium chloride (hypertonic)       Raziah Funnell, DO  [facility] Pager [phone]  If 7PM-7AM, please contact night-coverage [email or web address] Password [email or web address] 06/07/2015, 7:43 AM   LOS: 1 day

## 2015-06-07 NOTE — Progress Notes (Signed)
Peripherally Inserted Central Catheter/Midline Placement  The IV Nurse has discussed with the patient and/or persons authorized to consent for the patient, the purpose of this procedure and the potential benefits and risks involved with this procedure.  The benefits include less needle sticks, lab draws from the catheter and patient may be discharged home with the catheter.  Risks include, but not limited to, infection, bleeding, blood clot (thrombus formation), and puncture of an artery; nerve damage and irregular heat beat.  Alternatives to this procedure were also discussed.  PICC/Midline Placement Documentation        Sarah Leonard 06/07/2015, 9:07 AM Consent obtained by Christella Noa, RN

## 2015-06-08 DIAGNOSIS — E222 Syndrome of inappropriate secretion of antidiuretic hormone: Principal | ICD-10-CM

## 2015-06-08 DIAGNOSIS — E1165 Type 2 diabetes mellitus with hyperglycemia: Secondary | ICD-10-CM

## 2015-06-08 LAB — BASIC METABOLIC PANEL
ANION GAP: 12 (ref 5–15)
Anion gap: 11 (ref 5–15)
Anion gap: 8 (ref 5–15)
Anion gap: 8 (ref 5–15)
Anion gap: 10 (ref 5–15)
BUN: <5 mg/dL — ABNORMAL LOW (ref 6–20)
BUN: <5 mg/dL — ABNORMAL LOW (ref 6–20)
CHLORIDE: 82 mmol/L — AB (ref 101–111)
CHLORIDE: 85 mmol/L — AB (ref 101–111)
CO2: 27 mmol/L (ref 22–32)
CO2: 25 mmol/L (ref 22–32)
CO2: 27 mmol/L (ref 22–32)
CO2: 28 mmol/L (ref 22–32)
CO2: 26 mmol/L (ref 22–32)
CREATININE: 0.42 mg/dL — AB (ref 0.44–1.00)
CREATININE: 0.59 mg/dL (ref 0.44–1.00)
CREATININE: 0.51 mg/dL (ref 0.44–1.00)
Calcium: 8 mg/dL — ABNORMAL LOW (ref 8.9–10.3)
Calcium: 7.7 mg/dL — ABNORMAL LOW (ref 8.9–10.3)
Calcium: 7.7 mg/dL — ABNORMAL LOW (ref 8.9–10.3)
Calcium: 8.4 mg/dL — ABNORMAL LOW (ref 8.9–10.3)
Calcium: 8.2 mg/dL — ABNORMAL LOW (ref 8.9–10.3)
Chloride: 88 mmol/L — ABNORMAL LOW (ref 101–111)
Chloride: 86 mmol/L — ABNORMAL LOW (ref 101–111)
Chloride: 86 mmol/L — ABNORMAL LOW (ref 101–111)
Creatinine, Ser: 0.5 mg/dL (ref 0.44–1.00)
Creatinine, Ser: 0.43 mg/dL — ABNORMAL LOW (ref 0.44–1.00)
GFR calc Af Amer: >60 mL/min (ref >60)
GFR calc Af Amer: >60 mL/min (ref >60)
GFR calc Af Amer: >60 mL/min (ref >60)
GFR calc Af Amer: >60 mL/min (ref >60)
GLUCOSE: 194 mg/dL — AB (ref 65–99)
GLUCOSE: 211 mg/dL — AB (ref 65–99)
GLUCOSE: 183 mg/dL — AB (ref 65–99)
GLUCOSE: 205 mg/dL — AB (ref 65–99)
Glucose, Bld: 180 mg/dL — ABNORMAL HIGH (ref 65–99)
POTASSIUM: 3.8 mmol/L (ref 3.5–5.1)
POTASSIUM: 3.9 mmol/L (ref 3.5–5.1)
Potassium: 4 mmol/L (ref 3.5–5.1)
Potassium: 3.8 mmol/L (ref 3.5–5.1)
Potassium: 4 mmol/L (ref 3.5–5.1)
SODIUM: 120 mmol/L — AB (ref 135–145)
SODIUM: 125 mmol/L — AB (ref 135–145)
SODIUM: 121 mmol/L — AB (ref 135–145)
SODIUM: 122 mmol/L — AB (ref 135–145)
SODIUM: 121 mmol/L — AB (ref 135–145)

## 2015-06-08 LAB — GLUCOSE, CAPILLARY
GLUCOSE-CAPILLARY: 187 mg/dL — AB (ref 65–99)
GLUCOSE-CAPILLARY: 249 mg/dL — AB (ref 65–99)
GLUCOSE-CAPILLARY: 200 mg/dL — AB (ref 65–99)
Glucose-Capillary: 158 mg/dL — ABNORMAL HIGH (ref 65–99)

## 2015-06-08 MED ORDER — INSULIN ASPART 100 UNIT/ML ~~LOC~~ SOLN
0.0000 [IU] | Freq: Three times a day (TID) | SUBCUTANEOUS | Status: DC
Start: 2015-06-08 — End: 2015-06-11
  Administered 2015-06-08 – 2015-06-09 (×2): 3 [IU] via SUBCUTANEOUS
  Administered 2015-06-09 – 2015-06-10 (×2): 5 [IU] via SUBCUTANEOUS
  Administered 2015-06-10 (×2): 3 [IU] via SUBCUTANEOUS
  Administered 2015-06-11 (×2): 5 [IU] via SUBCUTANEOUS
  Administered 2015-06-11: 3 [IU] via SUBCUTANEOUS

## 2015-06-08 MED ORDER — INSULIN ASPART 100 UNIT/ML ~~LOC~~ SOLN
0.0000 [IU] | Freq: Every day | SUBCUTANEOUS | Status: DC
  Administered 2015-06-10: 2 [IU] via SUBCUTANEOUS

## 2015-06-08 NOTE — Progress Notes (Signed)
PROGRESS NOTE  Sarah Leonard VOJ:[id number] DOB: [birth year] DOA: 06/05/2015 PCP: Moreen Fowler   Brief history 54 year old female with a history of COPD, chronic respiratory failure on 3 L nasal cannula, interstitial lung disease, diabetes mellitus type 2, bipolar disorder, anemia presented with cough, headache, and chest congestion. Apparently, the patient was seen in the emergency department on 05/20/2015. She left AGAINST MEDICAL ADVICE. She was discharged with prednisone, amoxicillin, and albuterol inhaler. The patient states that she finished her antibiotics and prednisone 3 days prior to admission. Evaluation revealed serum sodium 117. The patient states that she has been on Cymbalta, Lamictal, and Seroquel for over one year. She denies any other new medications. She complained of loose stools that started on 06/03/2015, but has not had any stools in the past 24 hours. There is no hematochezia or melena. She has some dry heaves started on the same day, but she was able to eat her breakfast on the morning of 06/06/2015 without any difficulty. Assessment/Plan: Hyponatremia/SIADH -Cymbalta, Lamictal, and Seroquel are likely contributing to her hyponatremia -likely SIADH -Serum osmolarity 245; urine osmolarity 482; uric acid 2.9 -Check UA sp grav 1.015 -Check TSH--1.344 -am cortisol--10.6 -discontinue normal saline -10/27-->start hypertonic saline@ 20cc/hr; place PICC line -fluid restrict 1000cc every 24 hours -Repeat BMP every 4 hours -Discontinue lisinopril and cymbalta -10/28--Na = 121 Intractable headache--chronic -Likely related to the patient's hyponatremia -CT brain negative -pt has some opioid seeking behavior -pt usually sleeping when I enter room on multiple days -no IV opioids will be given Chronic respiratory failure/COPD -Presently stable on 3 L nasal cannula -Patient is normally on 3 L at home -Patient had 40-pack-year history of tobacco before  quitting -She states that she has never seen a pulmonologist -Continue symbicort  -Duonebs q 6 hrs -aerosolized albuterol and atrovent -11/25/2014 CT of the chest showed interstitial lung disease Diabetes mellitus type 2 -Hemoglobin A1c--10.4 -NovoLog sliding scale--increase to moderate scale -Discontinue metformin for now Bipolar disorder -Continue Lamictal, Seroquel for now History of Cocaine use -check UDS--positive benzo only  Family Communication: husband update at beside 10/28 Disposition Plan: Home when medically stable   Procedures/Studies: Dg Chest 2 View  06/05/2015  CLINICAL DATA:  Subacute onset of shortness of breath, cough and headaches. Initial encounter. EXAM: CHEST  2 VIEW COMPARISON:  Chest radiograph performed 05/20/2015, and CTA of the chest performed 11/25/2014 FINDINGS: The lungs are well-aerated. Vascular congestion is noted. Diffuse interstitial opacities are slightly less well characterized due to motion artifact, but appear relatively similar to the prior study, and worsened from April. This is concerning for worsening chronic interstitial lung disease, though superimposed pneumonia cannot be excluded. The heart is borderline normal in size. No acute osseous abnormalities are seen. IMPRESSION: Vascular congestion noted. Diffuse interstitial opacities are slightly less well characterized due to motion artifact, but appear relatively similar to the prior study, and worsened from April. This is concerning for worsening chronic interstitial lung disease, though superimposed pneumonia cannot be excluded. Electronically Signed   By: Garald Balding M.D.   On: 06/05/2015 21:19   Dg Chest 2 View  05/20/2015  CLINICAL DATA:  Productive cough.  Fatigue.  Three days duration. EXAM: CHEST  2 VIEW COMPARISON:  11/24/2014 FINDINGS: Extensive nodular infiltrates throughout both lungs appear worsened from 11/24/2014. This could be infectious or inflammatory. Neoplasm cannot be  excluded. Chronic interstitial disease may also be a factor. No effusions are evident. Hilar and mediastinal contours are  unremarkable. IMPRESSION: Diffuse nodularity throughout both lungs, indeterminate with regard to infectious, inflammatory or neoplastic etiology. This may also relate to a chronic interstitial disease but it is worsened from 11/24/2014. Electronically Signed   By: Andreas Newport M.D.   On: 05/20/2015 01:17   Ct Head Wo Contrast  06/06/2015  CLINICAL DATA:  Multifocal headache for 3 days. History of diabetes, hypertension, substance abuse. EXAM: CT HEAD WITHOUT CONTRAST TECHNIQUE: Contiguous axial images were obtained from the base of the skull through the vertex without intravenous contrast. COMPARISON:  CT head November 25, 2014 FINDINGS: The ventricles and sulci are normal. No intraparenchymal hemorrhage, mass effect nor midline shift. No acute large vascular territory infarcts. Patchy supratentorial white matter hypodensities, advanced for age though, similar to prior imaging. No abnormal extra-axial fluid collections. Basal cisterns are patent. No skull fracture. The included ocular globes and orbital contents are non-suspicious. Partially imaged LEFT maxillary mucosal retention cyst, with improved aeration of the paranasal sinuses from prior imaging. Small chronic LEFT mastoid effusion without air cell coalescence. Soft tissue within the RIGHT external auditory canal most compatible with cerumen. IMPRESSION: No acute intracranial process. Stable mild white matter changes most compatible with chronic small vessel ischemic disease, advanced for age. Electronically Signed   By: Elon Alas M.D.   On: 06/06/2015 06:09        Subjective:   Objective: Filed Vitals:   06/08/15 0755 06/08/15 0825 06/08/15 1120 06/08/15 1351  BP:  133/79 131/76 131/76  Pulse: 97  96 92  Temp: 97.7 F (36.5 C)     TempSrc: Oral     Resp: '20  20 18  '$ Height:      Weight:      SpO2: 96%   96% 100%    Intake/Output Summary (Last 24 hours) at 06/08/15 1511 Last data filed at 06/08/15 1300  Gross per 24 hour  Intake   1620 ml  Output   2100 ml  Net   -480 ml   Weight change:  Exam:   General:  Pt is alert, follows commands appropriately, not in acute distress  HEENT: No icterus, No thrush, No neck mass, /AT  Cardiovascular: RRR, S1/S2, no rubs, no gallops  Respiratory: CTA bilaterally, no wheezing, no crackles, no rhonchi  Abdomen: Soft/+BS, non tender, non distended, no guarding  Extremities: No edema, No lymphangitis, No petechiae, No rashes, no synovitis  Data Reviewed: Basic Metabolic Panel:  Recent Labs Lab 06/07/15 1813 06/07/15 2237 06/08/15 0523 06/08/15 0744 06/08/15 1411  NA 118* 120* 120* 125* 121*  K 3.8 3.6 4.0 3.8 3.8  CL 82* 84* 82* 88* 86*  CO2 '26 26 27 25 27  '$ GLUCOSE 171* 187* 194* 180* 211*  BUN <5* <5* <5* <5* <5*  CREATININE 0.39* 0.46 0.50 0.43* 0.42*  CALCIUM 8.1* 7.5* 8.0* 7.7* 7.7*   Liver Function Tests:  Recent Labs Lab 06/06/15 1105  AST 30  ALT 15  ALKPHOS 91  BILITOT 0.6  PROT 5.9*  ALBUMIN 3.2*    Recent Labs Lab 06/06/15 1105  LIPASE 31   No results for input(s): AMMONIA in the last 168 hours. CBC:  Recent Labs Lab 06/05/15 2050 06/06/15 0424  WBC 7.3 6.1  HGB 11.6* 11.2*  HCT 32.9* 31.4*  MCV 85.0 86.3  PLT 312 261   Cardiac Enzymes:  Recent Labs Lab 06/05/15 2050  TROPONINI <0.03   BNP: Invalid input(s): POCBNP CBG:  Recent Labs Lab 06/07/15 1300 06/07/15 1708 06/07/15 2116 06/08/15 0811 06/08/15  [place]*    Recent Results (from the past 240 hour(s))  MRSA PCR Screening     Status: None   Collection Time: 06/06/15  3:20 AM  Result Value Ref Range Status   MRSA by PCR NEGATIVE NEGATIVE Final    Comment:        The GeneXpert MRSA Assay (FDA approved for NASAL specimens only), is one component of a comprehensive MRSA  colonization surveillance program. It is not intended to diagnose MRSA infection nor to guide or monitor treatment for MRSA infections.      Scheduled Meds: . aspirin EC  81 mg Oral Daily  . budesonide-formoterol  2 puff Inhalation BID  . enoxaparin (LOVENOX) injection  40 mg Subcutaneous Q24H  . gabapentin  600 mg Oral TID  . insulin aspart  0-15 Units Subcutaneous TID WC  . insulin aspart  0-5 Units Subcutaneous QHS  . ipratropium-albuterol  3 mL Nebulization Q6H  . lamoTRIgine  125 mg Oral BID  . QUEtiapine  800 mg Oral QHS  . simvastatin  40 mg Oral q1800  . zolpidem  5 mg Oral QHS   Continuous Infusions: . sodium chloride (hypertonic) 20 mL/hr (06/08/15 1146)     Eaton Folmar, DO  [facility] Pager [phone]  If 7PM-7AM, please contact night-coverage [email or web address] Password [email or web address] 06/08/2015, 3:11 PM   LOS: 2 days

## 2015-06-08 NOTE — Clinical Social Work Note (Signed)
Clinical Social Work Assessment  Patient Details  Name: Sarah Leonard MRN: [id number] Date of Birth: 20-Sep-1960  Date of referral:  06/08/15               Reason for consult:  Facility Placement                Permission sought to share information with:  Facility Sport and exercise psychologist, Family Supports Permission granted to share information::  Yes, Verbal Permission Granted  Name::     Engineer, drilling::  [place]  Relationship::  sister  Contact Information:     Housing/Transportation Living arrangements for the past 2 months:  [place] of Information:  Patient Patient Interpreter Needed:  None Criminal Activity/Legal Involvement Pertinent to Current Situation/Hospitalization:  No - Comment as needed Significant Relationships:  Siblings Lives with:  Facility Resident Do you feel safe going back to the place where you live?  Yes Need for family participation in patient care:  No (Coment)  Care giving concerns:  Non- pt is long term resident at [facility] assessment / plan:  CSW spoke with pt and pt sister at bedside concerning plan for pt to return to [facility] when ready for DC from hospital  Employment status:  Retired Forensic scientist:  Medicaid In [place] PT Recommendations:  Not assessed at this time Information / Referral to community resources:     Patient/Family's Response to care:  Pt is agreeable to returning to [place] is her home.  Pt sister is very critical of [place] and thinks pt should be moved to SNF for more supervision/care- pt became upset when sister suggested she move to a different facility- pt is not agreeable to going to a different facility and would like to return to [facility]  Patient/Family's Understanding of and Emotional Response to Diagnosis, Current Treatment, and Prognosis:  No questions or concerns at this time- pt is hopeful to return home soon  Emotional  Assessment Appearance:  Appears stated age Attitude/Demeanor/Rapport:    Affect (typically observed):  Appropriate Orientation:  Oriented to Self, Oriented to Place, Oriented to  Time, Oriented to Situation Alcohol / Substance use:  Not Applicable Psych involvement (Current and /or in the community):  No (Comment)  Discharge Needs  Concerns to be addressed:  Care Coordination Readmission within the last 30 days:  No Current discharge risk:  None Barriers to Discharge:  Continued Medical Work up   [employer], LCSW 06/08/2015, 3:05 PM

## 2015-06-08 NOTE — Progress Notes (Signed)
Inpatient Diabetes Program Recommendations  AACE/ADA: New Consensus Statement on Inpatient Glycemic Control (2015)  Target Ranges:  Prepandial:   less than 140 mg/dL      Peak postprandial:   less than 180 mg/dL (1-2 hours)      Critically ill patients:  140 - 180 mg/dL   Review of Glycemic Control  Inpatient Diabetes Program Recommendations:  Correction (SSI): consider increasing to moderate scale  Thank you  Raoul Pitch BSN, RN,CDE Inpatient Diabetes Coordinator [phone] (team pager)

## 2015-06-09 LAB — BASIC METABOLIC PANEL
ANION GAP: 10 (ref 5–15)
ANION GAP: 9 (ref 5–15)
Anion gap: 10 (ref 5–15)
Anion gap: 11 (ref 5–15)
Anion gap: 9 (ref 5–15)
Anion gap: 10 (ref 5–15)
BUN: <5 mg/dL — ABNORMAL LOW (ref 6–20)
BUN: <5 mg/dL — ABNORMAL LOW (ref 6–20)
BUN: <5 mg/dL — ABNORMAL LOW (ref 6–20)
BUN: <5 mg/dL — ABNORMAL LOW (ref 6–20)
CALCIUM: 8 mg/dL — AB (ref 8.9–10.3)
CALCIUM: 7.8 mg/dL — AB (ref 8.9–10.3)
CALCIUM: 8.1 mg/dL — AB (ref 8.9–10.3)
CALCIUM: 8.1 mg/dL — AB (ref 8.9–10.3)
CALCIUM: 8.6 mg/dL — AB (ref 8.9–10.3)
CALCIUM: 8.6 mg/dL — AB (ref 8.9–10.3)
CHLORIDE: 84 mmol/L — AB (ref 101–111)
CHLORIDE: 88 mmol/L — AB (ref 101–111)
CHLORIDE: 87 mmol/L — AB (ref 101–111)
CO2: 24 mmol/L (ref 22–32)
CO2: 25 mmol/L (ref 22–32)
CO2: 26 mmol/L (ref 22–32)
CO2: 26 mmol/L (ref 22–32)
CO2: 27 mmol/L (ref 22–32)
CO2: 27 mmol/L (ref 22–32)
CREATININE: 0.45 mg/dL (ref 0.44–1.00)
CREATININE: 0.52 mg/dL (ref 0.44–1.00)
CREATININE: 0.52 mg/dL (ref 0.44–1.00)
CREATININE: 0.57 mg/dL (ref 0.44–1.00)
CREATININE: 0.55 mg/dL (ref 0.44–1.00)
Chloride: 86 mmol/L — ABNORMAL LOW (ref 101–111)
Chloride: 84 mmol/L — ABNORMAL LOW (ref 101–111)
Chloride: 86 mmol/L — ABNORMAL LOW (ref 101–111)
Creatinine, Ser: 0.44 mg/dL (ref 0.44–1.00)
GFR calc Af Amer: >60 mL/min (ref >60)
GFR calc Af Amer: >60 mL/min (ref >60)
GFR calc Af Amer: >60 mL/min (ref >60)
GFR calc non Af Amer: >60 mL/min (ref >60)
GFR calc non Af Amer: >60 mL/min (ref >60)
GFR calc non Af Amer: >60 mL/min (ref >60)
GFR calc non Af Amer: >60 mL/min (ref >60)
GFR calc non Af Amer: >60 mL/min (ref >60)
GLUCOSE: 205 mg/dL — AB (ref 65–99)
GLUCOSE: 190 mg/dL — AB (ref 65–99)
GLUCOSE: 238 mg/dL — AB (ref 65–99)
GLUCOSE: 247 mg/dL — AB (ref 65–99)
GLUCOSE: 237 mg/dL — AB (ref 65–99)
GLUCOSE: 195 mg/dL — AB (ref 65–99)
Potassium: 3.8 mmol/L (ref 3.5–5.1)
Potassium: 3.8 mmol/L (ref 3.5–5.1)
Potassium: 3.9 mmol/L (ref 3.5–5.1)
Potassium: 3.5 mmol/L (ref 3.5–5.1)
Potassium: 3.9 mmol/L (ref 3.5–5.1)
Potassium: 3.9 mmol/L (ref 3.5–5.1)
Sodium: 120 mmol/L — ABNORMAL LOW (ref 135–145)
Sodium: 118 mmol/L — CL (ref 135–145)
Sodium: 122 mmol/L — ABNORMAL LOW (ref 135–145)
Sodium: 121 mmol/L — ABNORMAL LOW (ref 135–145)
Sodium: 124 mmol/L — ABNORMAL LOW (ref 135–145)
Sodium: 124 mmol/L — ABNORMAL LOW (ref 135–145)

## 2015-06-09 LAB — GLUCOSE, CAPILLARY
GLUCOSE-CAPILLARY: 189 mg/dL — AB (ref 65–99)
GLUCOSE-CAPILLARY: 173 mg/dL — AB (ref 65–99)
Glucose-Capillary: 194 mg/dL — ABNORMAL HIGH (ref 65–99)
Glucose-Capillary: 215 mg/dL — ABNORMAL HIGH (ref 65–99)

## 2015-06-09 MED ORDER — DEXTROSE 50 % IV SOLN
INTRAVENOUS | Status: AC
  Administered 2015-06-09: 25 mL
  Filled 2015-06-09: qty 50

## 2015-06-09 MED ORDER — INSULIN ASPART 100 UNIT/ML ~~LOC~~ SOLN
4.0000 [IU] | Freq: Three times a day (TID) | SUBCUTANEOUS | Status: DC
  Administered 2015-06-09 – 2015-06-11 (×7): 4 [IU] via SUBCUTANEOUS

## 2015-06-09 NOTE — Progress Notes (Addendum)
PROGRESS NOTE  Sarah Leonard GGE:[id number] DOB: 1960/09/03 DOA: 06/05/2015 PCP: Moreen Fowler Brief history 54 year old female with a history of COPD, chronic respiratory failure on 3 L nasal cannula, interstitial lung disease, diabetes mellitus type 2, bipolar disorder, anemia presented with cough, headache, and chest congestion. Apparently, the patient was seen in the emergency department on 05/20/2015. She left AGAINST MEDICAL ADVICE. She was discharged with prednisone, amoxicillin, and albuterol inhaler. The patient states that she finished her antibiotics and prednisone 3 days prior to admission. Evaluation revealed serum sodium 117. The patient states that she has been on Cymbalta, Lamictal, and Seroquel for over one year. She denies any other new medications. She complained of loose stools that started on 06/03/2015, but has not had any stools in the past 24 hours. There is no hematochezia or melena. She has some dry heaves started on the same day, but she was able to eat her breakfast on the morning of 06/06/2015 without any difficulty. Assessment/Plan: Hyponatremia/SIADH -Cymbalta, Lamictal, and Seroquel are likely contributing to her hyponatremia -likely SIADH -Serum osmolarity 245; urine osmolarity 482; uric acid 2.9 -Check UA sp grav 1.015 -Check TSH--1.344 -am cortisol--10.6 -discontinue normal saline -10/27-->start hypertonic saline@ 20cc/hr; placed PICC line -06/09/2015--increase hypertonic saline to 25 mL per hour -fluid restrict 1000cc every 24 hours -Repeat BMP every 4 hours -Discontinue lisinopril and cymbalta -10/29--Na = 122 Intractable headache--chronic -Likely related to the patient's hyponatremia -CT brain negative -pt has some opioid seeking behavior -pt usually sleeping when I enter room on multiple days -no IV opioids will be given Chronic respiratory failure/COPD -Presently stable on 3 L nasal cannula -Patient is normally on 3 L at  home-->remarkably weaned to room air -Presently stable on room air--we will perform ambulatory pulse oximetry prior to discharge -Patient had 40-pack-year history of tobacco before quitting -She states that she has never seen a pulmonologist -Continue symbicort  -Duonebs q 6 hrs -aerosolized albuterol and atrovent -11/25/2014 CT of the chest showed interstitial lung disease Diabetes mellitus type 2 -Hemoglobin A1c--10.4 -NovoLog sliding scale--increase to moderate scale -Discontinue metformin for now -add novolog 4 units with meals Bipolar disorder -Continue Lamictal, Seroquel for now History of Cocaine use -check UDS--positive benzo only Leg pain and edema -duplex LE  Family Communication: sister update at beside 10/29 Disposition Plan: Home when medically stable    Procedures/Studies: Dg Chest 2 View  06/05/2015  CLINICAL DATA:  Subacute onset of shortness of breath, cough and headaches. Initial encounter. EXAM: CHEST  2 VIEW COMPARISON:  Chest radiograph performed 05/20/2015, and CTA of the chest performed 11/25/2014 FINDINGS: The lungs are well-aerated. Vascular congestion is noted. Diffuse interstitial opacities are slightly less well characterized due to motion artifact, but appear relatively similar to the prior study, and worsened from April. This is concerning for worsening chronic interstitial lung disease, though superimposed pneumonia cannot be excluded. The heart is borderline normal in size. No acute osseous abnormalities are seen. IMPRESSION: Vascular congestion noted. Diffuse interstitial opacities are slightly less well characterized due to motion artifact, but appear relatively similar to the prior study, and worsened from April. This is concerning for worsening chronic interstitial lung disease, though superimposed pneumonia cannot be excluded. Electronically Signed   By: Garald Balding M.D.   On: 06/05/2015 21:19   Dg Chest 2 View  05/20/2015  CLINICAL DATA:   Productive cough.  Fatigue.  Three days duration. EXAM: CHEST  2 VIEW COMPARISON:  11/24/2014 FINDINGS: Extensive  nodular infiltrates throughout both lungs appear worsened from 11/24/2014. This could be infectious or inflammatory. Neoplasm cannot be excluded. Chronic interstitial disease may also be a factor. No effusions are evident. Hilar and mediastinal contours are unremarkable. IMPRESSION: Diffuse nodularity throughout both lungs, indeterminate with regard to infectious, inflammatory or neoplastic etiology. This may also relate to a chronic interstitial disease but it is worsened from 11/24/2014. Electronically Signed   By: Andreas Newport M.D.   On: 05/20/2015 01:17   Ct Head Wo Contrast  06/06/2015  CLINICAL DATA:  Multifocal headache for 3 days. History of diabetes, hypertension, substance abuse. EXAM: CT HEAD WITHOUT CONTRAST TECHNIQUE: Contiguous axial images were obtained from the base of the skull through the vertex without intravenous contrast. COMPARISON:  CT head November 25, 2014 FINDINGS: The ventricles and sulci are normal. No intraparenchymal hemorrhage, mass effect nor midline shift. No acute large vascular territory infarcts. Patchy supratentorial white matter hypodensities, advanced for age though, similar to prior imaging. No abnormal extra-axial fluid collections. Basal cisterns are patent. No skull fracture. The included ocular globes and orbital contents are non-suspicious. Partially imaged LEFT maxillary mucosal retention cyst, with improved aeration of the paranasal sinuses from prior imaging. Small chronic LEFT mastoid effusion without air cell coalescence. Soft tissue within the RIGHT external auditory canal most compatible with cerumen. IMPRESSION: No acute intracranial process. Stable mild white matter changes most compatible with chronic small vessel ischemic disease, advanced for age. Electronically Signed   By: Elon Alas M.D.   On: 06/06/2015 06:09          Subjective: Overall patient is feeling better. She is more alert. Still has a headache but overall better than previously. Denies any visual disturbance, focal extremity weakness, chest discomfort, shortness breath. Denies fevers, chills, nausea, vomiting, diarrhea, or abd pain. No dysuria.  Objective: Filed Vitals:   06/09/15 0357 06/09/15 0744 06/09/15 0817 06/09/15 1200  BP: 139/81   135/82  Pulse: 98   101  Temp: 98.3 F (36.8 C) 97.6 F (36.4 C)  97.6 F (36.4 C)  TempSrc: Oral Oral  Oral  Resp: 14     Height:      Weight: 109.907 kg (242 lb 4.8 oz)     SpO2: 97%  97%     Intake/Output Summary (Last 24 hours) at 06/09/15 1337 Last data filed at 06/09/15 1236  Gross per 24 hour  Intake 1209.5 ml  Output   3425 ml  Net -2215.5 ml   Weight change:  Exam:   General:  Pt is alert, follows commands appropriately, not in acute distress  HEENT: No icterus, No thrush, No neck mass, [place]/AT  Cardiovascular: RRR, S1/S2, no rubs, no gallops  Respiratory: Bilateral scattered rales. No wheezes  Abdomen: Soft/+BS, non tender, non distended, no guarding  Extremities: 1 +LE edema, No lymphangitis, No petechiae, No rashes, no synovitis  Data Reviewed: Basic Metabolic Panel:  Recent Labs Lab 06/08/15 1955 06/08/15 2232 06/09/15 0257 06/09/15 0559 06/09/15 0952  NA 122* 121* 120* 118* 122*  K 4.0 3.9 3.8 3.8 3.9  CL 86* 85* 86* 84* 86*  CO2 '28 26 24 25 26  '$ GLUCOSE 183* 205* 205* 190* 238*  BUN <5* <5* <5* <5* PENDING  CREATININE 0.59 0.51 0.45 0.44 0.52  CALCIUM 8.4* 8.2* 8.0* 7.8* 8.1*   Liver Function Tests:  Recent Labs Lab 06/06/15 1105  AST 30  ALT 15  ALKPHOS 91  BILITOT 0.6  PROT 5.9*  ALBUMIN 3.2*    Recent  Labs Lab 06/06/15 1105  LIPASE 31   No results for input(s): AMMONIA in the last 168 hours. CBC:  Recent Labs Lab 06/05/15 2050 06/06/15 0424  WBC 7.3 6.1  HGB 11.6* 11.2*  HCT 32.9* 31.4*  MCV 85.0 86.3  PLT 312 261    Cardiac Enzymes:  Recent Labs Lab 06/05/15 2050  TROPONINI <0.03   BNP: Invalid input(s): POCBNP CBG:  Recent Labs Lab 06/08/15 1202 06/08/15 1633 06/08/15 2153 06/09/15 0750 06/09/15 1215  GLUCAP 249* 200* 158* 194* 189*    Recent Results (from the past 240 hour(s))  MRSA PCR Screening     Status: None   Collection Time: 06/06/15  3:20 AM  Result Value Ref Range Status   MRSA by PCR NEGATIVE NEGATIVE Final    Comment:        The GeneXpert MRSA Assay (FDA approved for NASAL specimens only), is one component of a comprehensive MRSA colonization surveillance program. It is not intended to diagnose MRSA infection nor to guide or monitor treatment for MRSA infections.      Scheduled Meds: . aspirin EC  81 mg Oral Daily  . budesonide-formoterol  2 puff Inhalation BID  . enoxaparin (LOVENOX) injection  40 mg Subcutaneous Q24H  . gabapentin  600 mg Oral TID  . insulin aspart  0-15 Units Subcutaneous TID WC  . insulin aspart  0-5 Units Subcutaneous QHS  . ipratropium-albuterol  3 mL Nebulization Q6H  . lamoTRIgine  125 mg Oral BID  . QUEtiapine  800 mg Oral QHS  . simvastatin  40 mg Oral q1800  . zolpidem  5 mg Oral QHS   Continuous Infusions: . sodium chloride (hypertonic) 25 mL/hr (06/09/15 1211)     Laksh Hinners, DO  [facility] Pager [phone]  If 7PM-7AM, please contact night-coverage [email or web address] Password [email or web address] 06/09/2015, 1:37 PM   LOS: 3 days

## 2015-06-09 NOTE — Progress Notes (Signed)
CRITICAL VALUE ALERT  Critical value received:  Na 118  Date of notification:  06/09/2015  Time of notification:  0639  Critical value read back: Yes  Nurse who received alert: Eulis Foster RN  MD notified (1st page):  Donnal Debar  Time of first page:  [phone]  MD notified (2nd page):  Time of second page:  Responding MD:  Donnal Debar  Time MD responded:  [phone]

## 2015-06-10 ENCOUNTER — Inpatient Hospital Stay (HOSPITAL_COMMUNITY): Payer: Medicaid Other

## 2015-06-10 DIAGNOSIS — J9610 Chronic respiratory failure, unspecified whether with hypoxia or hypercapnia: Secondary | ICD-10-CM

## 2015-06-10 DIAGNOSIS — E1122 Type 2 diabetes mellitus with diabetic chronic kidney disease: Secondary | ICD-10-CM

## 2015-06-10 DIAGNOSIS — R0902 Hypoxemia: Secondary | ICD-10-CM

## 2015-06-10 DIAGNOSIS — N189 Chronic kidney disease, unspecified: Secondary | ICD-10-CM

## 2015-06-10 DIAGNOSIS — E1129 Type 2 diabetes mellitus with other diabetic kidney complication: Secondary | ICD-10-CM

## 2015-06-10 LAB — GLUCOSE, CAPILLARY
GLUCOSE-CAPILLARY: 199 mg/dL — AB (ref 65–99)
GLUCOSE-CAPILLARY: 207 mg/dL — AB (ref 65–99)
Glucose-Capillary: 173 mg/dL — ABNORMAL HIGH (ref 65–99)
Glucose-Capillary: 220 mg/dL — ABNORMAL HIGH (ref 65–99)

## 2015-06-10 LAB — BASIC METABOLIC PANEL
ANION GAP: 10 (ref 5–15)
Anion gap: 11 (ref 5–15)
Anion gap: 8 (ref 5–15)
CHLORIDE: 91 mmol/L — AB (ref 101–111)
CHLORIDE: 91 mmol/L — AB (ref 101–111)
CHLORIDE: 92 mmol/L — AB (ref 101–111)
CO2: 26 mmol/L (ref 22–32)
CO2: 24 mmol/L (ref 22–32)
CO2: 27 mmol/L (ref 22–32)
CREATININE: 0.48 mg/dL (ref 0.44–1.00)
Calcium: 8.6 mg/dL — ABNORMAL LOW (ref 8.9–10.3)
Calcium: 8.5 mg/dL — ABNORMAL LOW (ref 8.9–10.3)
Calcium: 8.6 mg/dL — ABNORMAL LOW (ref 8.9–10.3)
Creatinine, Ser: 0.43 mg/dL — ABNORMAL LOW (ref 0.44–1.00)
Creatinine, Ser: 0.5 mg/dL (ref 0.44–1.00)
GFR calc Af Amer: >60 mL/min (ref >60)
GFR calc Af Amer: >60 mL/min (ref >60)
GFR calc non Af Amer: >60 mL/min (ref >60)
GFR calc non Af Amer: >60 mL/min (ref >60)
GFR calc non Af Amer: >60 mL/min (ref >60)
GLUCOSE: 221 mg/dL — AB (ref 65–99)
GLUCOSE: 200 mg/dL — AB (ref 65–99)
GLUCOSE: 200 mg/dL — AB (ref 65–99)
POTASSIUM: 3.8 mmol/L (ref 3.5–5.1)
POTASSIUM: 3.7 mmol/L (ref 3.5–5.1)
POTASSIUM: 3.9 mmol/L (ref 3.5–5.1)
Sodium: 127 mmol/L — ABNORMAL LOW (ref 135–145)
Sodium: 126 mmol/L — ABNORMAL LOW (ref 135–145)
Sodium: 127 mmol/L — ABNORMAL LOW (ref 135–145)

## 2015-06-10 MED ORDER — LORAZEPAM 0.5 MG PO TABS
0.5000 mg | ORAL_TABLET | Freq: Four times a day (QID) | ORAL | Status: DC | PRN
  Administered 2015-06-11: 0.5 mg via ORAL
  Filled 2015-06-10 (×2): qty 1

## 2015-06-10 MED ORDER — INSULIN GLARGINE 100 UNIT/ML ~~LOC~~ SOLN
6.0000 [IU] | Freq: Every day | SUBCUTANEOUS | Status: DC
  Administered 2015-06-10: 6 [IU] via SUBCUTANEOUS
  Filled 2015-06-10 (×2): qty 0.06

## 2015-06-10 MED ORDER — IPRATROPIUM-ALBUTEROL 0.5-2.5 (3) MG/3ML IN SOLN: 3.0000 mL | Freq: Four times a day (QID) | RESPIRATORY_TRACT | Status: DC | PRN

## 2015-06-10 NOTE — Progress Notes (Signed)
RN noticed an ordered to discontinue sodium chloride 3% solution. Called pharmacy for clarification. Waited for 0200 BMP results. Sodium was 127. Pharmacy called and notified RN to stop the sodium chloride 3% solution drip. Drip was stopped at 0247. Will continue to monitor.   Hart Rochester, RN, BSN

## 2015-06-10 NOTE — Progress Notes (Signed)
NURSING PROGRESS NOTE  Sarah Leonard [id number] Transfer Data: 06/10/2015 5:08 PM Attending Provider: Orson Eva, MD XNA:[id number], [place] Bo Code Status: FULL  Sarah Leonard is a 54 y.o. female patient transferred from [place]  -No acute distress noted.  -No complaints of shortness of breath.  -No complaints of chest pain.   Cardiac Monitoring: Box # 20 in place. Cardiac monitor yields:NSR  Blood pressure 168/91, pulse 101, temperature 99.1 F (37.3 C), temperature source Oral, resp. rate 16, height '5\' 3"'$  (1.6 m), weight 109.907 kg (242 lb 4.8 oz), SpO2 98 %.   IV Fluids:  IV in place, occlusive dsg intact without redness, IV cath (Picc line SL at this time).  Allergies:  Review of patient's allergies indicates no known allergies.  Past Medical History:   has a past medical history of Diabetes mellitus; Pancreatitis; Anemia; Bipolar 1 disorder ([place]); Alcohol abuse; Respiratory distress; COPD (chronic obstructive pulmonary disease) ([place]); Hypotension; Major depressive disorder ([place]); Cocaine abuse; Chronic back pain; Renal disorder; Acidosis; Hyperkalemia; Abscess; Acute encephalopathy; and Septic shock ([place]).  Past Surgical History:   has past surgical history that includes Cholecystectomy; Appendectomy; Tonsillectomy; Incision and drainage abscess (Right, 03/01/2013); and Cesarean section.  Social History:   reports that she has been smoking Cigarettes.  She has been smoking about 0.00 packs per day. She has never used smokeless tobacco. She reports that she uses illicit drugs (Cocaine). She reports that she does not drink alcohol.  Skin: Intact  Patient/Family orientated to room. Information packet given to patient/family. Admission inpatient armband information verified with patient/family to include name and date of birth and placed on patient arm. Side rails up x 2, fall assessment and education completed with patient/family. Patient/family able to verbalize understanding of risk  associated with falls and verbalized understanding to call for assistance before getting out of bed. Call light within reach. Patient/family able to voice and demonstrate understanding of unit orientation instructions.    Will continue to evaluate and treat per MD orders.

## 2015-06-10 NOTE — Progress Notes (Signed)
PROGRESS NOTE  Sarah Leonard PTW:[id number] DOB: [birth year] DOA: 06/05/2015 PCP: Moreen Fowler  Brief history 54 year old female with a history of COPD, chronic respiratory failure on 3 L nasal cannula, interstitial lung disease, diabetes mellitus type 2, bipolar disorder, anemia presented with cough, headache, and chest congestion. Apparently, the patient was seen in the emergency department on 05/20/2015. She left AGAINST MEDICAL ADVICE. She was discharged with prednisone, amoxicillin, and albuterol inhaler. The patient states that she finished her antibiotics and prednisone 3 days prior to admission. Evaluation revealed serum sodium 117. The patient states that she has been on Cymbalta, Lamictal, and Seroquel for over one year. She denies any other new medications. She complained of loose stools that started on 06/03/2015, but has not had any stools in the past 24 hours. There is no hematochezia or melena. She has some dry heaves started on the same day, but she was able to eat her breakfast on the morning of 06/06/2015 without any difficulty. Assessment/Plan: Hyponatremia/SIADH -Cymbalta, Lamictal, and Seroquel are likely contributing to her hyponatremia -likely SIADH -Serum osmolarity 245; urine osmolarity 482; uric acid 2.9 -Check UA sp grav 1.015 -Check TSH--1.344 -am cortisol--10.6 -discontinue normal saline -10/27-->start hypertonic saline@ 20cc/hr; placed PICC line -06/09/2015--increase hypertonic saline to 25 mL per hour -06/10/15--hypertonic saline stopped -fluid restrict 1000cc every 24 hours -Discontinue lisinopril and cymbalta -10/30--Na = 127 -recheck BMP in am Intractable headache--chronic -Likely related to the patient's hyponatremia -CT brain negative -pt has some opioid seeking behavior -pt usually sleeping when I enter room on multiple days -no IV opioids will be given Chronic respiratory failure/COPD -Presently stable on 3 L nasal  cannula -Patient is normally on 3 L at home-->remarkably weaned to room air -Presently stable on room air--perform ambulatory pulse oximetry prior to discharge -Patient had 40-pack-year history of tobacco before quitting -She states that she has never seen a pulmonologist -Continue symbicort  -Duonebs q 6 hrs -aerosolized albuterol and atrovent -11/25/2014 CT of the chest showed interstitial lung disease Diabetes mellitus type 2 -Hemoglobin A1c--10.4 -NovoLog sliding scale--increase to moderate scale -Discontinue metformin for now -06/10/15--start lantus 6 units daily -add novolog 4 units with meals Bipolar disorder -Continue Lamictal, Seroquel for now History of Cocaine use -check UDS--positive benzo only Leg pain and edema -duplex LE Deconditioning -PT eval Family Communication: sister update at beside 10/30 Disposition Plan: Home 10/31 if Na stable    Procedures/Studies: Dg Chest 2 View  06/05/2015  CLINICAL DATA:  Subacute onset of shortness of breath, cough and headaches. Initial encounter. EXAM: CHEST  2 VIEW COMPARISON:  Chest radiograph performed 05/20/2015, and CTA of the chest performed 11/25/2014 FINDINGS: The lungs are well-aerated. Vascular congestion is noted. Diffuse interstitial opacities are slightly less well characterized due to motion artifact, but appear relatively similar to the prior study, and worsened from April. This is concerning for worsening chronic interstitial lung disease, though superimposed pneumonia cannot be excluded. The heart is borderline normal in size. No acute osseous abnormalities are seen. IMPRESSION: Vascular congestion noted. Diffuse interstitial opacities are slightly less well characterized due to motion artifact, but appear relatively similar to the prior study, and worsened from April. This is concerning for worsening chronic interstitial lung disease, though superimposed pneumonia cannot be excluded. Electronically Signed   By:  Garald Balding M.D.   On: 06/05/2015 21:19   Dg Chest 2 View  05/20/2015  CLINICAL DATA:  Productive cough.  Fatigue.  Three days duration. EXAM:  CHEST  2 VIEW COMPARISON:  11/24/2014 FINDINGS: Extensive nodular infiltrates throughout both lungs appear worsened from 11/24/2014. This could be infectious or inflammatory. Neoplasm cannot be excluded. Chronic interstitial disease may also be a factor. No effusions are evident. Hilar and mediastinal contours are unremarkable. IMPRESSION: Diffuse nodularity throughout both lungs, indeterminate with regard to infectious, inflammatory or neoplastic etiology. This may also relate to a chronic interstitial disease but it is worsened from 11/24/2014. Electronically Signed   By: Andreas Newport M.D.   On: 05/20/2015 01:17   Ct Head Wo Contrast  06/06/2015  CLINICAL DATA:  Multifocal headache for 3 days. History of diabetes, hypertension, substance abuse. EXAM: CT HEAD WITHOUT CONTRAST TECHNIQUE: Contiguous axial images were obtained from the base of the skull through the vertex without intravenous contrast. COMPARISON:  CT head November 25, 2014 FINDINGS: The ventricles and sulci are normal. No intraparenchymal hemorrhage, mass effect nor midline shift. No acute large vascular territory infarcts. Patchy supratentorial white matter hypodensities, advanced for age though, similar to prior imaging. No abnormal extra-axial fluid collections. Basal cisterns are patent. No skull fracture. The included ocular globes and orbital contents are non-suspicious. Partially imaged LEFT maxillary mucosal retention cyst, with improved aeration of the paranasal sinuses from prior imaging. Small chronic LEFT mastoid effusion without air cell coalescence. Soft tissue within the RIGHT external auditory canal most compatible with cerumen. IMPRESSION: No acute intracranial process. Stable mild white matter changes most compatible with chronic small vessel ischemic disease, advanced for age.  Electronically Signed   By: Elon Alas M.D.   On: 06/06/2015 06:09         Subjective: Patient denies fevers, chills, headache, chest pain, dyspnea, nausea, vomiting, diarrhea, abdominal pain, dysuria, hematuria.  Headache overall is improving. Denies any visual disturbance.   Objective: Filed Vitals:   06/10/15 0800 06/10/15 0913 06/10/15 1200 06/10/15 1422  BP: 117/68  133/83   Pulse:   94   Temp: 98.5 F (36.9 C)  98 F (36.7 C)   TempSrc: Oral  Oral   Resp:      Height:      Weight:      SpO2:  97%  97%    Intake/Output Summary (Last 24 hours) at 06/10/15 1433 Last data filed at 06/10/15 0631  Gross per 24 hour  Intake 170.42 ml  Output   1900 ml  Net -1729.58 ml   Weight change:  Exam:   General:  Pt is alert, follows commands appropriately, not in acute distress  HEENT: No icterus, No thrush, No neck mass, [place]/AT  Cardiovascular: RRR, S1/S2, no rubs, no gallops  Respiratory: Bilateral scattered rales without any wheezing  Abdomen: Soft/+BS, non tender, non distended, no guarding; no hepatosplenomegaly   Extremities: 1+LE edema, No lymphangitis, No petechiae, No rashes, no synovitis  Data Reviewed: Basic Metabolic Panel:  Recent Labs Lab 06/09/15 1849 06/09/15 2158 06/10/15 0152 06/10/15 0605 06/10/15 1133  NA 124* 124* 127* 126* 127*  K 3.9 3.9 3.8 3.7 3.9  CL 88* 87* 91* 91* 92*  CO2 '27 27 26 24 27  '$ GLUCOSE 237* 195* 221* 200* 200*  BUN <5* <5* <5* <5* <5*  CREATININE 0.57 0.55 0.48 0.43* 0.50  CALCIUM 8.6* 8.6* 8.6* 8.5* 8.6*   Liver Function Tests:  Recent Labs Lab 06/06/15 1105  AST 30  ALT 15  ALKPHOS 91  BILITOT 0.6  PROT 5.9*  ALBUMIN 3.2*    Recent Labs Lab 06/06/15 1105  LIPASE 31   No results  for input(s): AMMONIA in the last 168 hours. CBC:  Recent Labs Lab 06/05/15 2050 06/06/15 0424  WBC 7.3 6.1  HGB 11.6* 11.2*  HCT 32.9* 31.4*  MCV 85.0 86.3  PLT 312 261   Cardiac Enzymes:  Recent  Labs Lab 06/05/15 2050  TROPONINI <0.03   BNP: Invalid input(s): POCBNP CBG:  Recent Labs Lab 06/09/15 0750 06/09/15 1215 06/09/15 1719 06/09/15 2141 06/10/15 0753  GLUCAP 194* 189* 215* 173* 199*    Recent Results (from the past 240 hour(s))  MRSA PCR Screening     Status: None   Collection Time: 06/06/15  3:20 AM  Result Value Ref Range Status   MRSA by PCR NEGATIVE NEGATIVE Final    Comment:        The GeneXpert MRSA Assay (FDA approved for NASAL specimens only), is one component of a comprehensive MRSA colonization surveillance program. It is not intended to diagnose MRSA infection nor to guide or monitor treatment for MRSA infections.      Scheduled Meds: . aspirin EC  81 mg Oral Daily  . budesonide-formoterol  2 puff Inhalation BID  . enoxaparin (LOVENOX) injection  40 mg Subcutaneous Q24H  . gabapentin  600 mg Oral TID  . insulin aspart  0-15 Units Subcutaneous TID WC  . insulin aspart  0-5 Units Subcutaneous QHS  . insulin aspart  4 Units Subcutaneous TID WC  . lamoTRIgine  125 mg Oral BID  . QUEtiapine  800 mg Oral QHS  . simvastatin  40 mg Oral q1800  . zolpidem  5 mg Oral QHS   Continuous Infusions:    Govind Furey, DO  [facility] Pager [phone]  If 7PM-7AM, please contact night-coverage [email or web address] Password [email or web address] 06/10/2015, 2:33 PM   LOS: 4 days

## 2015-06-10 NOTE — Progress Notes (Addendum)
MEDICATION RELATED CONSULT NOTE    Assessment: It was brought to my attention that the order for 3% NaCl was discontinued earlier this evening, but the drip continued to run. Current Na is up to 127 from 118. The order was previously discontinued due to the rise of sodium   Plan:  Stop 3% NaCl - plan communicated with nurse   Patient Measurements: Height: '5\' 3"'$  (160 cm) Weight: 242 lb 4.8 oz (109.907 kg) IBW/kg (Calculated) : 52.4 Adjusted Body Weight:   Vital Signs: Temp: 98.3 F (36.8 C) (10/29 2348) Temp Source: Oral (10/29 2348) BP: 141/76 mmHg (10/29 2348) Pulse Rate: 107 (10/29 2348) Intake/Output from previous day: 10/29 0701 - 10/30 0700 In: 774.5 [P.O.:480; I.V.:294.5] Out: 2025 [Urine:2025] Intake/Output from this shift: Total I/O In: -  Out: 700 [Urine:700]  Labs:  Recent Labs  06/09/15 1849 06/09/15 2158 06/10/15 0152  CREATININE 0.57 0.55 0.48   Estimated Creatinine Clearance: 94.6 mL/min (by C-G formula based on Cr of 0.48).   Medical History: Past Medical History  Diagnosis Date  . Diabetes mellitus   . Pancreatitis   . Anemia   . Bipolar 1 disorder ([place])   . Alcohol abuse   . 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 COPD (chronic obstructive pulmonary disease) ([place])   . Hypotension   . Major depressive disorder ([place])   . Cocaine abuse   . Chronic back pain   . Renal disorder   . Acidosis   . Hyperkalemia   . Abscess   . Acute encephalopathy   . Septic shock ([facility])     Medications:  Prescriptions prior to admission  Medication Sig Dispense Refill Last Dose  . acetaminophen (TYLENOL) 325 MG tablet Take 325 mg by mouth 3 (three) times daily. Prior to fish oil   06/05/2015 at Unknown time  . albuterol (PROVENTIL HFA;VENTOLIN HFA) 108 (90 BASE) MCG/ACT inhaler Inhale 2 puffs into the lungs every 4 (four) hours as needed for wheezing or shortness of breath. 1 Inhaler 0 05/28/2015  . aspirin EC 81 MG tablet Take 81 mg by  mouth daily.   06/05/2015 at Unknown time  . budesonide-formoterol (SYMBICORT) 80-4.5 MCG/ACT inhaler Inhale 2 puffs into the lungs 2 (two) times daily.   06/05/2015 at Unknown time  . calcium-vitamin D (OSCAL WITH D) 500-200 MG-UNIT tablet Take 1 tablet by mouth 2 (two) times daily.   06/05/2015 at Unknown time  . cholecalciferol (VITAMIN D) 1000 UNITS tablet Take 1,000 Units by mouth daily.   06/05/2015 at Unknown time  . docusate sodium (COLACE) 100 MG capsule Take 100 mg by mouth 2 (two) times daily.   06/05/2015 at Unknown time  . DULoxetine (CYMBALTA) 60 MG capsule Take 120 mg by mouth daily.    06/05/2015 at Unknown time  . gabapentin (NEURONTIN) 300 MG capsule Take 600 mg by mouth 3 (three) times daily.   06/05/2015 at Unknown time  . guaifenesin (ROBITUSSIN) 100 MG/5ML syrup Take 200 mg by mouth every 4 (four) hours as needed for cough.   06/03/2015  . lamoTRIgine (LAMICTAL) 100 MG tablet Take 100 mg by mouth 2 (two) times daily. Takes with 25 mg tab   06/05/2015 at Unknown time  . lamoTRIgine (LAMICTAL) 25 MG tablet Take 25 mg by mouth 2 (two) times daily. Takes with '100mg'$  tablet   06/05/2015 at Unknown time  . lisinopril (PRINIVIL,ZESTRIL) 5 MG tablet Take 5 mg by mouth daily.   06/05/2015 at Unknown  time  . LORazepam (ATIVAN) 0.5 MG tablet Take 1 tablet (0.5 mg total) by mouth every 8 (eight) hours as needed for anxiety. 30 tablet 0 06/05/2015 at Unknown time  . metFORMIN (GLUCOPHAGE) 1000 MG tablet Take 1 tablet (1,000 mg total) by mouth 2 (two) times daily with a meal. 60 tablet 0 06/05/2015 at Unknown time  . niacin (NIASPAN) 750 MG CR tablet Take 750 mg by mouth at bedtime.   06/04/2015 at Unknown time  . omeprazole (PRILOSEC) 20 MG capsule Take 20 mg by mouth daily before supper.    06/04/2015 at Unknown time  . promethazine (PHENERGAN) 12.5 MG tablet Take 12.5 mg by mouth every 6 (six) hours as needed for nausea or vomiting.   06/03/2015  . QUEtiapine (SEROQUEL XR) 400 MG 24 hr  tablet Take 800 mg by mouth at bedtime.   06/04/2015 at Unknown time  . simvastatin (ZOCOR) 40 MG tablet Take 40 mg by mouth daily at 6 PM.    06/04/2015 at Unknown time  . tiotropium (SPIRIVA) 18 MCG inhalation capsule Place 18 mcg into inhaler and inhale daily.   06/05/2015 at Unknown time  . zolpidem (AMBIEN) 10 MG tablet Take 5 mg by mouth at bedtime.    06/04/2015 at Unknown time      Harvel Quale 06/10/2015,2:49 AM

## 2015-06-10 NOTE — Progress Notes (Signed)
Patient refused to go down to vascular, would not take advice/ encouragement from RN. Will have to be rescheduled for tomorrow.

## 2015-06-11 ENCOUNTER — Inpatient Hospital Stay (HOSPITAL_COMMUNITY): Payer: Medicaid Other

## 2015-06-11 DIAGNOSIS — Z794 Long term (current) use of insulin: Secondary | ICD-10-CM

## 2015-06-11 LAB — GLUCOSE, CAPILLARY
GLUCOSE-CAPILLARY: 197 mg/dL — AB (ref 65–99)
Glucose-Capillary: 245 mg/dL — ABNORMAL HIGH (ref 65–99)
Glucose-Capillary: 240 mg/dL — ABNORMAL HIGH (ref 65–99)

## 2015-06-11 LAB — BASIC METABOLIC PANEL
Anion gap: 10 (ref 5–15)
BUN: 6 mg/dL (ref 6–20)
CALCIUM: 8.6 mg/dL — AB (ref 8.9–10.3)
CO2: 24 mmol/L (ref 22–32)
Chloride: 92 mmol/L — ABNORMAL LOW (ref 101–111)
Creatinine, Ser: 0.47 mg/dL (ref 0.44–1.00)
GFR calc Af Amer: >60 mL/min (ref >60)
GLUCOSE: 238 mg/dL — AB (ref 65–99)
Potassium: 3.7 mmol/L (ref 3.5–5.1)
Sodium: 126 mmol/L — ABNORMAL LOW (ref 135–145)

## 2015-06-11 MED ORDER — INSULIN GLARGINE 100 UNIT/ML ~~LOC~~ SOLN: 10.0000 [IU] | Freq: Every day | SUBCUTANEOUS | Status: AC

## 2015-06-11 MED ORDER — INSULIN GLARGINE 100 UNIT/ML ~~LOC~~ SOLN
10.0000 [IU] | Freq: Every day | SUBCUTANEOUS | Status: DC
  Filled 2015-06-11: qty 0.1

## 2015-06-11 MED ORDER — IPRATROPIUM-ALBUTEROL 0.5-2.5 (3) MG/3ML IN SOLN: 3.0000 mL | Freq: Four times a day (QID) | RESPIRATORY_TRACT | Status: AC | PRN

## 2015-06-11 MED ORDER — INSULIN ASPART 100 UNIT/ML ~~LOC~~ SOLN: 5.0000 [IU] | Freq: Three times a day (TID) | SUBCUTANEOUS | Status: AC

## 2015-06-11 NOTE — Evaluation (Signed)
Physical Therapy Evaluation Patient Details Name: Sarah Leonard MRN: [id number] DOB: Dec 21, 1960 Today's Date: 06/11/2015   History of Present Illness  Patient is a 54 y/o female presents with HA and hyponatremia. PMH includes COPD on home O2, undifferentiated interstitial lung disease, NIDDM, bipolar depression and chronic normocytic anemia   Clinical Impression  Patient presents with generalized weakness and balance deficits impacting safe mobility. Required use of rail in hallway for support at times. Pt with DOE and elevated heart rate to 131bpm with ambulation requiring 2 standing rest breaks. Would benefit from HHPT to maximize independence and mobility and to minimize fall risk. Will follow acutely.    Follow Up Recommendations Home health PT;Supervision - Intermittent    Equipment Recommendations  None recommended by PT    Recommendations for Other Services       Precautions / Restrictions Precautions Precautions: Fall Restrictions Weight Bearing Restrictions: No      Mobility  Bed Mobility Overal bed mobility: Needs Assistance Bed Mobility: Supine to Sit     Supine to sit: Modified independent (Device/Increase time)     General bed mobility comments: HOB flat, no use of rails.   Transfers Overall transfer level: Needs assistance Equipment used: None Transfers: Sit to/from Stand Sit to Stand: Min guard         General transfer comment: Min guard for safety as pt unsteady initiating upon standing reaching for counter. Stood from [employer]. Transferred to chair post ambulation bout.  Ambulation/Gait Ambulation/Gait assistance: Min assist Ambulation Distance (Feet): 175 Feet Assistive device: None (rail in hallway) Gait Pattern/deviations: Step-through pattern;Decreased stride length;Drifts right/left   Gait velocity interpretation: Below normal speed for age/gender General Gait Details: SLow, unsteady gait with pt needing to hold onto handrail in hallway  for support at times. Dyspnea present. HR increased to 131 bpm.  Stairs            Wheelchair Mobility    Modified Rankin (Stroke Patients Only)       Balance Overall balance assessment: Needs assistance Sitting-balance support: Feet supported;No upper extremity supported Sitting balance-Leahy Scale: Good Sitting balance - Comments: Able to reach outside BoS and donn socks wthout difficulty.    Standing balance support: During functional activity Standing balance-Leahy Scale: Fair                               Pertinent Vitals/Pain Pain Assessment: No/denies pain    Home Living Family/patient expects to be discharged to:: Assisted living               Home Equipment: None Additional Comments: From [employer].    Prior Function Level of Independence: Independent         Comments: Furniture walking noted during session. Does not do IADLS or drive. Performs own self care.     Hand Dominance        Extremity/Trunk Assessment   Upper Extremity Assessment: Defer to OT evaluation           Lower Extremity Assessment: Generalized weakness         Communication   Communication: No difficulties  Cognition Arousal/Alertness: Awake/alert Behavior During Therapy: WFL for tasks assessed/performed Overall Cognitive Status: Within Functional Limits for tasks assessed                      General Comments General comments (skin integrity, edema, etc.): Family present in room during session.  Exercises        Assessment/Plan    PT Assessment Patient needs continued PT services  PT Diagnosis Difficulty walking;Generalized weakness   PT Problem List Decreased strength;Cardiopulmonary status limiting activity;Decreased balance;Decreased mobility  PT Treatment Interventions Balance training;Gait training;Functional mobility training;Therapeutic activities;Therapeutic exercise;Patient/family education   PT Goals (Current goals  can be found in the Care Plan section) Acute Rehab PT Goals Patient Stated Goal: [place] home PT Goal Formulation: With patient/family Time For Goal Achievement: 06/18/15 Potential to Achieve Goals: Fair    Frequency Min 3X/week   Barriers to discharge Decreased caregiver support Pt lives alone in ALF.    Co-evaluation               End of Session Equipment Utilized During Treatment: Gait belt Activity Tolerance: Patient tolerated treatment well;Patient limited by fatigue Patient left: in chair;with call bell/phone within reach;with family/visitor present;with nursing/sitter in room (no chair alarm pads left.) Nurse Communication: Mobility status         Time: 4967-5916 PT Time Calculation (min) (ACUTE ONLY): 24 min   Charges:   PT Evaluation $Initial PT Evaluation Tier I: 1 Procedure PT Treatments $Gait Training: 8-22 mins   PT G Codes:        Shericka Johnstone A Tyresse Jayson 06/11/2015, 10:26 AM Wray Kearns, PT, DPT [phone]

## 2015-06-11 NOTE — Care Management Note (Addendum)
Case Management Note Patient Details  Name: Sarah Leonard MRN: [id number] Date of Birth: [birth year]  Subjective/Objective:    Date: 06/11/15 Spoke with patient at the bedside along with spouse. Introduced self as Tourist information centre manager and explained role in discharge planning and how to be reached. Verified patient lives in town, at [place]. Has DME  oxygen  That she does not use continously only when  Needed, but she is not sure what company she has the oxygen with. Expressed potential need for no other DME. Verified patient anticipates to go back to ALF at time of discharge and will have full-time supervision by facility at this time to best of their knowledge. Patient denied needing help with their medication. Patient  See the MD on site at [facility] as well. Verified patient has PCP Damaris Hippo.  Per pt eval recs hhpt, [place] has their own physical therapy dept, they will just need order on paper work.  NCM will place order for HHPt on chart and CSW will put in paper work.  Plan: CM will continue to follow for discharge planning and [facility] resources.                 Action/Plan:   Expected Discharge Date:                  Expected Discharge Plan:  Assisted Living / Rest Home  In-House Referral:  Clinical Social Work  Discharge planning Services  CM Consult  Post Acute Care Choice:  Home Health Choice offered to:     DME Arranged:    DME Agency:     HH Arranged:  PT HH Agency:  Other - See comment  Status of Service:  Completed, signed off  Medicare Important Message Given:    Date Medicare IM Given:    Medicare IM give by:    Date Additional Medicare IM Given:    Additional Medicare Important Message give by:     If discussed at [place] of Stay Meetings, dates discussed:    Additional Comments:  Zenon Mayo, RN 06/11/2015, 12:31 PM

## 2015-06-11 NOTE — Progress Notes (Signed)
Sarah Leonard discharged Sarah Leonard per MD order.  Report called to receiving Sarah Leonard, [facility]IC med tech at [facility].     Medication List    STOP taking these medications        DULoxetine 60 MG capsule  Commonly known as:  CYMBALTA     lisinopril 5 MG tablet  Commonly known as:  PRINIVIL,ZESTRIL      TAKE these medications        acetaminophen 325 MG tablet  Commonly known as:  TYLENOL  Take 325 mg by mouth 3 (three) times daily. Prior to fish oil     albuterol 108 (90 BASE) MCG/ACT inhaler  Commonly known as:  PROVENTIL HFA;VENTOLIN HFA  Inhale 2 puffs into the lungs every 4 (four) hours as needed for wheezing or shortness of breath.     aspirin EC 81 MG tablet  Take 81 mg by mouth daily.     budesonide-formoterol 80-4.5 MCG/ACT inhaler  Commonly known as:  SYMBICORT  Inhale 2 puffs into the lungs 2 (two) times daily.     calcium-vitamin D 500-200 MG-UNIT tablet  Commonly known as:  OSCAL WITH D  Take 1 tablet by mouth 2 (two) times daily.     cholecalciferol 1000 UNITS tablet  Commonly known as:  VITAMIN D  Take 1,000 Units by mouth daily.     docusate sodium 100 MG capsule  Commonly known as:  COLACE  Take 100 mg by mouth 2 (two) times daily.     gabapentin 300 MG capsule  Commonly known as:  NEURONTIN  Take 600 mg by mouth 3 (three) times daily.     guaifenesin 100 MG/5ML syrup  Commonly known as:  ROBITUSSIN  Take 200 mg by mouth every 4 (four) hours as needed for cough.     insulin aspart 100 UNIT/ML injection  Commonly known as:  novoLOG  Inject 5 Units into the skin 3 (three) times daily with meals.     insulin glargine 100 UNIT/ML injection  Commonly known as:  LANTUS  Inject 0.1 mLs (10 Units total) into the skin at bedtime.     ipratropium-albuterol 0.5-2.5 (3) MG/3ML Soln  Commonly known as:  DUONEB  Take 3 mLs by nebulization every 6 (six) hours as needed.     lamoTRIgine 25 MG tablet  Commonly known as:  LAMICTAL  Take 25  mg by mouth 2 (two) times daily. Takes with '100mg'$  tablet     LORazepam 0.5 MG tablet  Commonly known as:  ATIVAN  Take 1 tablet (0.5 mg total) by mouth every 8 (eight) hours as needed for anxiety.     metFORMIN 1000 MG tablet  Commonly known as:  GLUCOPHAGE  Take 1 tablet (1,000 mg total) by mouth 2 (two) times daily with a meal.     niacin 750 MG CR tablet  Commonly known as:  NIASPAN  Take 750 mg by mouth at bedtime.     omeprazole 20 MG capsule  Commonly known as:  PRILOSEC  Take 20 mg by mouth daily before supper.     promethazine 12.5 MG tablet  Commonly known as:  PHENERGAN  Take 12.5 mg by mouth every 6 (six) hours as needed for nausea or vomiting.     QUEtiapine 400 MG 24 hr tablet  Commonly known as:  SEROQUEL XR  Take 800 mg by mouth at bedtime.     simvastatin 40 MG tablet  Commonly known as:  ZOCOR  Take 40  mg by mouth daily at 6 PM.     tiotropium 18 MCG inhalation capsule  Commonly known as:  SPIRIVA  Place 18 mcg into inhaler and inhale daily.     zolpidem 10 MG tablet  Commonly known as:  AMBIEN  Take 5 mg by mouth at bedtime.        Patients skin is clean, dry and intact, no evidence of skin break down. IV PICC line site discontinued by IV team and catheter remains intact. Site without signs and symptoms of complications. Dressing and pressure applied.  Patient transported by sister via car & transported to car by NT, no distress noted upon discharge.  Sarah Leonard, Sarah Leonard C 06/11/2015 5:57 PM

## 2015-06-11 NOTE — Discharge Summary (Addendum)
Physician Discharge Summary  Sarah Leonard ZTI:[id number] DOB: 1961-04-09 DOA: 06/05/2015  PCP: Moreen Fowler  Admit date: 06/05/2015 Discharge date: 06/11/2015  Recommendations for Outpatient Follow-up:  1. Pt will need to follow up with PCP in 2 weeks post discharge 2. Please obtain BMP on 06/13/15 3. Please maintain 1000cc fluid restriction every 24 hours  Discharge Diagnoses:  Hyponatremia/SIADH -Cymbalta, Lamictal, and Seroquel are likely contributing to her hyponatremia -likely SIADH -Serum osmolarity 245; urine osmolarity 482; uric acid 2.9 -Check UA sp grav 1.015 -Check TSH--1.344 -am cortisol--10.6 -discontinue normal saline -10/27-->start hypertonic saline@ 20cc/hr; placed PICC line -06/09/2015--increase hypertonic saline to 25 mL per hour -06/10/15--hypertonic saline stopped -06/12/15--Na remained stable 126-127 -fluid restrict 1000cc every 24 hours -Discontinue lisinopril and cymbalta -10/30--Na = 127 -06/11/15 Na = 126 -recheck BMP on 06/13/15 Intractable headache--chronic -Likely related to the patient's hyponatremia -CT brain negative -pt has some opioid seeking behavior -pt usually sleeping when I enter room on multiple days -no IV opioids will be given Chronic respiratory failure/COPD -Presently stable on 3 L nasal cannula -Patient is normally on 3 L at home-->remarkably weaned to room air -Presently stable on room air -Patient had 40-pack-year history of tobacco before quitting -She states that she has never seen a pulmonologist -Continue symbicort  -Duonebs q 6 hrs -aerosolized albuterol and atrovent -11/25/2014 CT of the chest showed interstitial lung disease Diabetes mellitus type 2 -Hemoglobin A1c--10.4 -NovoLog sliding scale--increase to moderate scale -Discontinue metformin for now--restart after discharge -increased lantus 10 units daily -add novolog 5 units with meals -Her CBGs will need to continue to be monitored and insulin  regimen adjusted. Bipolar disorder -Continue Lamictal, Seroquel for now History of Cocaine use -check UDS--positive benzo only Leg pain and edema -duplex LE--patient refused Deconditioning -PT eval Family Communication: sister update at beside 10/30 Disposition Plan: Home 10/31 if Na stable  Discharge Condition: stable  Disposition: home  Diet:carb modified Wt Readings from Last 3 Encounters:  06/10/15 108.863 kg (240 lb)  05/20/15 106.198 kg (234 lb 2 oz)  11/26/14 110.678 kg (244 lb)    History of present illness:  54 year old female with a history of COPD, chronic respiratory failure on 3 L nasal cannula, interstitial lung disease, diabetes mellitus type 2, bipolar disorder, anemia presented with cough, headache, and chest congestion. Apparently, the patient was seen in the emergency department on 05/20/2015. She left AGAINST MEDICAL ADVICE. She was discharged with prednisone, amoxicillin, and albuterol inhaler. The patient states that she finished her antibiotics and prednisone 3 days prior to admission. Evaluation revealed serum sodium 117. The patient states that she has been on Cymbalta, Lamictal, and Seroquel for over one year. She denies any other new medications. She complained of loose stools that started on 06/03/2015, but has not had any stools in the past 24 hours. There is no hematochezia or melena. She has some dry heaves started on the same day, but she was able to eat her breakfast on the morning of 06/06/2015 without any difficulty.  Pt was started on NS.  Her Na continued to drop down to 115.  PICC line was placed and she was placed on hypertonic saline. She was placed on a 1000cc fluid restriction.  Labs were consistent with SIADH.   Her Na slowly improved up to 126-127.  Symptomatically, she improved and her mental status returned to baseline.  Hypertonic saline was stopped and pt was observed >24 hours off of hypertonic saline and her Na remained stable.  She will  remain  on a 1000cc fluid restriction every 24 hours.  Cymbalta was stopped.  Alternative meds may need to started by her psychiatrist that has less effect on pt's Na.  Discharge Exam: Filed Vitals:   06/11/15 0921  BP:   Pulse: 101  Temp:   Resp: 18   Filed Vitals:   06/10/15 1712 06/10/15 2149 06/11/15 0538 06/11/15 0921  BP:  155/91 138/70   Pulse:  94 96 101  Temp:  98.3 F (36.8 C) 98.3 F (36.8 C)   TempSrc:  Oral Oral   Resp:  '18 18 18  '$ Height:      Weight: 108.863 kg (240 lb)     SpO2:  100% 100% 98%   General: A&O x 3, NAD, pleasant, cooperative Cardiovascular: RRR, no rub, no gallop, no S3 Respiratory: bibasilar rales, good air movement Abdomen:soft, nontender, nondistended, positive bowel sounds Extremities: 1+ LE edema, No lymphangitis, no petechiae  Discharge Instructions      Discharge Instructions    Diet - low sodium heart healthy    Complete by:  As directed      Increase activity slowly    Complete by:  As directed             Medication List    STOP taking these medications        DULoxetine 60 MG capsule  Commonly known as:  CYMBALTA     lisinopril 5 MG tablet  Commonly known as:  PRINIVIL,ZESTRIL      TAKE these medications        acetaminophen 325 MG tablet  Commonly known as:  TYLENOL  Take 325 mg by mouth 3 (three) times daily. Prior to fish oil     albuterol 108 (90 BASE) MCG/ACT inhaler  Commonly known as:  PROVENTIL HFA;VENTOLIN HFA  Inhale 2 puffs into the lungs every 4 (four) hours as needed for wheezing or shortness of breath.     aspirin EC 81 MG tablet  Take 81 mg by mouth daily.     budesonide-formoterol 80-4.5 MCG/ACT inhaler  Commonly known as:  SYMBICORT  Inhale 2 puffs into the lungs 2 (two) times daily.     calcium-vitamin D 500-200 MG-UNIT tablet  Commonly known as:  OSCAL WITH D  Take 1 tablet by mouth 2 (two) times daily.     cholecalciferol 1000 UNITS tablet  Commonly known as:  VITAMIN D  Take 1,000  Units by mouth daily.     docusate sodium 100 MG capsule  Commonly known as:  COLACE  Take 100 mg by mouth 2 (two) times daily.     gabapentin 300 MG capsule  Commonly known as:  NEURONTIN  Take 600 mg by mouth 3 (three) times daily.     guaifenesin 100 MG/5ML syrup  Commonly known as:  ROBITUSSIN  Take 200 mg by mouth every 4 (four) hours as needed for cough.     insulin aspart 100 UNIT/ML injection  Commonly known as:  novoLOG  Inject 5 Units into the skin 3 (three) times daily with meals.     insulin glargine 100 UNIT/ML injection  Commonly known as:  LANTUS  Inject 0.1 mLs (10 Units total) into the skin at bedtime.     ipratropium-albuterol 0.5-2.5 (3) MG/3ML Soln  Commonly known as:  DUONEB  Take 3 mLs by nebulization every 6 (six) hours as needed.     lamoTRIgine 25 MG tablet  Commonly known as:  LAMICTAL  Take 25 mg by mouth 2 (  two) times daily. Takes with '100mg'$  tablet     LORazepam 0.5 MG tablet  Commonly known as:  ATIVAN  Take 1 tablet (0.5 mg total) by mouth every 8 (eight) hours as needed for anxiety.     metFORMIN 1000 MG tablet  Commonly known as:  GLUCOPHAGE  Take 1 tablet (1,000 mg total) by mouth 2 (two) times daily with a meal.     niacin 750 MG CR tablet  Commonly known as:  NIASPAN  Take 750 mg by mouth at bedtime.     omeprazole 20 MG capsule  Commonly known as:  PRILOSEC  Take 20 mg by mouth daily before supper.     promethazine 12.5 MG tablet  Commonly known as:  PHENERGAN  Take 12.5 mg by mouth every 6 (six) hours as needed for nausea or vomiting.     QUEtiapine 400 MG 24 hr tablet  Commonly known as:  SEROQUEL XR  Take 800 mg by mouth at bedtime.     simvastatin 40 MG tablet  Commonly known as:  ZOCOR  Take 40 mg by mouth daily at 6 PM.     tiotropium 18 MCG inhalation capsule  Commonly known as:  SPIRIVA  Place 18 mcg into inhaler and inhale daily.     zolpidem 10 MG tablet  Commonly known as:  AMBIEN  Take 5 mg by mouth at  bedtime.         The results of significant diagnostics from this hospitalization (including imaging, microbiology, ancillary and laboratory) are listed below for reference.    Significant Diagnostic Studies: Dg Chest 2 View  06/05/2015  CLINICAL DATA:  Subacute onset of shortness of breath, cough and headaches. Initial encounter. EXAM: CHEST  2 VIEW COMPARISON:  Chest radiograph performed 05/20/2015, and CTA of the chest performed 11/25/2014 FINDINGS: The lungs are well-aerated. Vascular congestion is noted. Diffuse interstitial opacities are slightly less well characterized due to motion artifact, but appear relatively similar to the prior study, and worsened from April. This is concerning for worsening chronic interstitial lung disease, though superimposed pneumonia cannot be excluded. The heart is borderline normal in size. No acute osseous abnormalities are seen. IMPRESSION: Vascular congestion noted. Diffuse interstitial opacities are slightly less well characterized due to motion artifact, but appear relatively similar to the prior study, and worsened from April. This is concerning for worsening chronic interstitial lung disease, though superimposed pneumonia cannot be excluded. Electronically Signed   By: Garald Balding M.D.   On: 06/05/2015 21:19   Dg Chest 2 View  05/20/2015  CLINICAL DATA:  Productive cough.  Fatigue.  Three days duration. EXAM: CHEST  2 VIEW COMPARISON:  11/24/2014 FINDINGS: Extensive nodular infiltrates throughout both lungs appear worsened from 11/24/2014. This could be infectious or inflammatory. Neoplasm cannot be excluded. Chronic interstitial disease may also be a factor. No effusions are evident. Hilar and mediastinal contours are unremarkable. IMPRESSION: Diffuse nodularity throughout both lungs, indeterminate with regard to infectious, inflammatory or neoplastic etiology. This may also relate to a chronic interstitial disease but it is worsened from 11/24/2014.  Electronically Signed   By: Andreas Newport M.D.   On: 05/20/2015 01:17   Ct Head Wo Contrast  06/06/2015  CLINICAL DATA:  Multifocal headache for 3 days. History of diabetes, hypertension, substance abuse. EXAM: CT HEAD WITHOUT CONTRAST TECHNIQUE: Contiguous axial images were obtained from the base of the skull through the vertex without intravenous contrast. COMPARISON:  CT head November 25, 2014 FINDINGS: The ventricles and sulci  are normal. No intraparenchymal hemorrhage, mass effect nor midline shift. No acute large vascular territory infarcts. Patchy supratentorial white matter hypodensities, advanced for age though, similar to prior imaging. No abnormal extra-axial fluid collections. Basal cisterns are patent. No skull fracture. The included ocular globes and orbital contents are non-suspicious. Partially imaged LEFT maxillary mucosal retention cyst, with improved aeration of the paranasal sinuses from prior imaging. Small chronic LEFT mastoid effusion without air cell coalescence. Soft tissue within the RIGHT external auditory canal most compatible with cerumen. IMPRESSION: No acute intracranial process. Stable mild white matter changes most compatible with chronic small vessel ischemic disease, advanced for age. Electronically Signed   By: Elon Alas M.D.   On: 06/06/2015 06:09     Microbiology: Recent Results (from the past 240 hour(s))  MRSA PCR Screening     Status: None   Collection Time: 06/06/15  3:20 AM  Result Value Ref Range Status   MRSA by PCR NEGATIVE NEGATIVE Final    Comment:        The GeneXpert MRSA Assay (FDA approved for NASAL specimens only), is one component of a comprehensive MRSA colonization surveillance program. It is not intended to diagnose MRSA infection nor to guide or monitor treatment for MRSA infections.      Labs: Basic Metabolic Panel:  Recent Labs Lab 06/09/15 2158 06/10/15 0152 06/10/15 0605 06/10/15 1133 06/11/15 0715  NA 124*  127* 126* 127* 126*  K 3.9 3.8 3.7 3.9 3.7  CL 87* 91* 91* 92* 92*  CO2 '27 26 24 27 24  '$ GLUCOSE 195* 221* 200* 200* 238*  BUN <5* <5* <5* <5* 6  CREATININE 0.55 0.48 0.43* 0.50 0.47  CALCIUM 8.6* 8.6* 8.5* 8.6* 8.6*   Liver Function Tests:  Recent Labs Lab 06/06/15 1105  AST 30  ALT 15  ALKPHOS 91  BILITOT 0.6  PROT 5.9*  ALBUMIN 3.2*    Recent Labs Lab 06/06/15 1105  LIPASE 31   No results for input(s): AMMONIA in the last 168 hours. CBC:  Recent Labs Lab 06/05/15 2050 06/06/15 0424  WBC 7.3 6.1  HGB 11.6* 11.2*  HCT 32.9* 31.4*  MCV 85.0 86.3  PLT 312 261   Cardiac Enzymes:  Recent Labs Lab 06/05/15 2050  TROPONINI <0.03   BNP: Invalid input(s): POCBNP CBG:  Recent Labs Lab 06/10/15 0753 06/10/15 1219 06/10/15 1723 06/10/15 2152 06/11/15 0805  GLUCAP 199* 173* 220* 207* 245*    Time coordinating discharge:  Greater than 30 minutes  Signed:  Winona Sison, DO [facility] Pager: [phone] 06/11/2015, 9:36 AM

## 2015-06-11 NOTE — NC FL2 (Signed)
[place] MEDICAID FL2 LEVEL OF CARE SCREENING TOOL     IDENTIFICATION  Patient Name: Sarah Leonard Birthdate: [birth year] Sex: female Admission Date (Current Location): 06/05/2015  [facility] and [place] Number: Herbalist and Address:  [facility] [place], [place] [address], [place], [place] [address]      Provider Number: [id number]  Attending Physician Name and Address:  Orson Eva, MD  Relative Name and Phone Number:       Current Level of Care: Hospital Recommended Level of Care: [place] Prior Approval Number:    Date Approved/Denied:   PASRR Number:    Discharge Plan: Other (Comment)    Current Diagnoses: Patient Active Problem List   Diagnosis Date Noted  . SIADH (syndrome of inappropriate ADH production) ([place]) 06/08/2015  . Type 2 diabetes mellitus with hyperglycemia ([place]) 06/07/2015  . Hyperlipidemia 06/07/2015  . Hyponatremia 06/06/2015  . Headache 06/06/2015  . Chronic respiratory failure ([place]) 06/06/2015  . Bipolar I disorder ([place]) 06/06/2015  . Frontal headache   . Acute respiratory failure with hypoxia ([place]) 11/29/2014  . Hyperglycemia due to type 2 diabetes mellitus ([place]) 11/29/2014  . Obesity 11/29/2014  . Tobacco abuse 11/29/2014  . Noncompliance with medications 11/29/2014  . Lactic acidosis 11/25/2014  . Diabetes mellitus type 2, uncontrolled ([place]) 11/25/2014  . Hypertension 11/25/2014  . COPD with acute exacerbation ([place])   . Axillary abscess 03/03/2013  . Septic shock ([place]) 02/28/2013  . Acute renal failure ([place]) 02/28/2013  . Abscess 02/28/2013  . Acidosis 02/28/2013  . Hyperkalemia 02/28/2013    Orientation ACTIVITIES/SOCIAL BLADDER RESPIRATION    Self, Time, Situation    Continent    BEHAVIORAL SYMPTOMS/MOOD NEUROLOGICAL BOWEL NUTRITION STATUS      Continent Diet  PHYSICIAN VISITS COMMUNICATION OF NEEDS Height & Weight Skin    Verbally '5\' 3"'$  (160 cm) 240 lbs. Normal           AMBULATORY STATUS RESPIRATION    Supervision limited        Personal Care Assistance Level of Assistance  Dressing, Bathing Bathing Assistance: Limited assistance   Dressing Assistance: Limited assistance      Functional Limitations Info                SPECIAL CARE FACTORS FREQUENCY                      Additional Factors Info  Code Status, Allergies Code Status Info:  (Full Code) Allergies Info: No Known Allergies   Insulin Sliding Scale Info: 3x a day       Current Medications (06/11/2015): Current Facility-Administered Medications  Medication Dose Route Frequency Provider Last Rate Last Dose  . acetaminophen (TYLENOL) tablet 650 mg  650 mg Oral Q6H PRN Orson Eva, MD   650 mg at 06/06/15 1609  . aspirin EC tablet 81 mg  81 mg Oral Daily Edwin Dada, MD   81 mg at 06/11/15 1010  . budesonide-formoterol (SYMBICORT) 80-4.5 MCG/ACT inhaler 2 puff  2 puff Inhalation BID Edwin Dada, MD   2 puff at 06/11/15 8119  . enoxaparin (LOVENOX) injection 40 mg  40 mg Subcutaneous Q24H Orson Eva, MD   40 mg at 06/11/15 1254  . gabapentin (NEURONTIN) capsule 600 mg  600 mg Oral TID Edwin Dada, MD   600 mg at 06/11/15 1010  . HYDROcodone-acetaminophen (NORCO/VICODIN) 5-325 MG per tablet 1 tablet  1 tablet Oral Q6H PRN Orson Eva,  MD   1 tablet at 06/11/15 1254  . insulin aspart (novoLOG) injection 0-15 Units  0-15 Units Subcutaneous TID WC Orson Eva, MD   5 Units at 06/11/15 1254  . insulin aspart (novoLOG) injection 0-5 Units  0-5 Units Subcutaneous QHS Orson Eva, MD   2 Units at 06/10/15 2248  . insulin aspart (novoLOG) injection 4 Units  4 Units Subcutaneous TID WC Orson Eva, MD   4 Units at 06/11/15 1259  . insulin glargine (LANTUS) injection 10 Units  10 Units Subcutaneous QHS Romie Tay, MD      . ipratropium-albuterol (DUONEB) 0.5-2.5 (3) MG/3ML nebulizer solution 3 mL  3 mL Nebulization Q6H PRN Orson Eva, MD      . lamoTRIgine (LAMICTAL)  tablet 125 mg  125 mg Oral BID Edwin Dada, MD   125 mg at 06/11/15 1255  . LORazepam (ATIVAN) tablet 0.5 mg  0.5 mg Oral Q6H PRN Orson Eva, MD   0.5 mg at 06/11/15 1254  . QUEtiapine (SEROQUEL XR) 24 hr tablet 800 mg  800 mg Oral QHS Edwin Dada, MD   800 mg at 06/10/15 2338  . simvastatin (ZOCOR) tablet 40 mg  40 mg Oral q1800 Edwin Dada, MD   40 mg at 06/10/15 1738  . zolpidem (AMBIEN) tablet 5 mg  5 mg Oral QHS Edwin Dada, MD   5 mg at 06/10/15 2154   Do not use this list as official medication orders. Please verify with discharge summary.  Discharge Medications:   Medication List    STOP taking these medications        DULoxetine 60 MG capsule  Commonly known as:  CYMBALTA     lisinopril 5 MG tablet  Commonly known as:  PRINIVIL,ZESTRIL      TAKE these medications        acetaminophen 325 MG tablet  Commonly known as:  TYLENOL  Take 325 mg by mouth 3 (three) times daily. Prior to fish oil     albuterol 108 (90 BASE) MCG/ACT inhaler  Commonly known as:  PROVENTIL HFA;VENTOLIN HFA  Inhale 2 puffs into the lungs every 4 (four) hours as needed for wheezing or shortness of breath.     aspirin EC 81 MG tablet  Take 81 mg by mouth daily.     budesonide-formoterol 80-4.5 MCG/ACT inhaler  Commonly known as:  SYMBICORT  Inhale 2 puffs into the lungs 2 (two) times daily.     calcium-vitamin D 500-200 MG-UNIT tablet  Commonly known as:  OSCAL WITH D  Take 1 tablet by mouth 2 (two) times daily.     cholecalciferol 1000 UNITS tablet  Commonly known as:  VITAMIN D  Take 1,000 Units by mouth daily.     docusate sodium 100 MG capsule  Commonly known as:  COLACE  Take 100 mg by mouth 2 (two) times daily.     gabapentin 300 MG capsule  Commonly known as:  NEURONTIN  Take 600 mg by mouth 3 (three) times daily.     guaifenesin 100 MG/5ML syrup  Commonly known as:  ROBITUSSIN  Take 200 mg by mouth every 4 (four) hours as needed for  cough.     insulin aspart 100 UNIT/ML injection  Commonly known as:  novoLOG  Inject 5 Units into the skin 3 (three) times daily with meals.     insulin glargine 100 UNIT/ML injection  Commonly known as:  LANTUS  Inject 0.1 mLs (10 Units total) into the skin  at bedtime.     ipratropium-albuterol 0.5-2.5 (3) MG/3ML Soln  Commonly known as:  DUONEB  Take 3 mLs by nebulization every 6 (six) hours as needed.     lamoTRIgine 25 MG tablet  Commonly known as:  LAMICTAL  Take 25 mg by mouth 2 (two) times daily. Takes with '100mg'$  tablet     LORazepam 0.5 MG tablet  Commonly known as:  ATIVAN  Take 1 tablet (0.5 mg total) by mouth every 8 (eight) hours as needed for anxiety.     metFORMIN 1000 MG tablet  Commonly known as:  GLUCOPHAGE  Take 1 tablet (1,000 mg total) by mouth 2 (two) times daily with a meal.     niacin 750 MG CR tablet  Commonly known as:  NIASPAN  Take 750 mg by mouth at bedtime.     omeprazole 20 MG capsule  Commonly known as:  PRILOSEC  Take 20 mg by mouth daily before supper.     promethazine 12.5 MG tablet  Commonly known as:  PHENERGAN  Take 12.5 mg by mouth every 6 (six) hours as needed for nausea or vomiting.     QUEtiapine 400 MG 24 hr tablet  Commonly known as:  SEROQUEL XR  Take 800 mg by mouth at bedtime.     simvastatin 40 MG tablet  Commonly known as:  ZOCOR  Take 40 mg by mouth daily at 6 PM.     tiotropium 18 MCG inhalation capsule  Commonly known as:  SPIRIVA  Place 18 mcg into inhaler and inhale daily.     zolpidem 10 MG tablet  Commonly known as:  AMBIEN  Take 5 mg by mouth at bedtime.        Relevant Imaging Results:  Relevant Lab Results:  Recent Labs    Additional Information    Anterhaus, Jones Broom, LCSWA

## 2015-06-11 NOTE — Progress Notes (Signed)
*  PRELIMINARY RESULTS* Vascular Ultrasound Lower extremity venous duplex has been completed.  Preliminary findings: negative for DVT.   Landry Mellow, RDMS, RVT  06/11/2015, 11:09 AM

## 2015-06-11 NOTE — Clinical Social Work Note (Signed)
Patient to be d/c'ed today to [facility].  Patient and family agreeable to plans will transport via relative's care.  Evette Cristal, MSW, [place]

## 2015-06-11 NOTE — [place] FL2 (Deleted)
[place] MEDICAID FL2 LEVEL OF CARE SCREENING TOOL     IDENTIFICATION  Patient Name: Sarah Leonard Birthdate: 05-19-1961 Sex: female Admission Date (Current Location): 06/05/2015  [facility] and [place] Number: Herbalist and Address:  [facility], [place] [address] [place], [place] [address]      Provider Number: [id number]  Attending Physician Name and Address:  Orson Eva, MD  Relative Name and Phone Number:       Current Level of Care: Hospital Recommended Level of Care: [place] Prior Approval Number:    Date Approved/Denied:   PASRR Number:    Discharge Plan: Other (Comment)    Current Diagnoses: Patient Active Problem List   Diagnosis Date Noted  . SIADH (syndrome of inappropriate ADH production) ([place]) 06/08/2015  . Type 2 diabetes mellitus with hyperglycemia ([place]) 06/07/2015  . Hyperlipidemia 06/07/2015  . Hyponatremia 06/06/2015  . Headache 06/06/2015  . Chronic respiratory failure ([place]) 06/06/2015  . Bipolar I disorder ([place]) 06/06/2015  . Frontal headache   . Acute respiratory failure with hypoxia ([place]) 11/29/2014  . Hyperglycemia due to type 2 diabetes mellitus ([place]) 11/29/2014  . Obesity 11/29/2014  . Tobacco abuse 11/29/2014  . Noncompliance with medications 11/29/2014  . Lactic acidosis 11/25/2014  . Diabetes mellitus type 2, uncontrolled ([place]) 11/25/2014  . Hypertension 11/25/2014  . COPD with acute exacerbation ([place])   . Axillary abscess 03/03/2013  . Septic shock ([place]) 02/28/2013  . Acute renal failure ([place]) 02/28/2013  . Abscess 02/28/2013  . Acidosis 02/28/2013  . Hyperkalemia 02/28/2013    Orientation ACTIVITIES/SOCIAL BLADDER RESPIRATION    Self, Time, Situation    Continent    BEHAVIORAL SYMPTOMS/MOOD NEUROLOGICAL BOWEL NUTRITION STATUS      Continent Diet  PHYSICIAN VISITS COMMUNICATION OF NEEDS Height & Weight Skin    Verbally '5\' 3"'$  (160 cm) 240 lbs. Normal           AMBULATORY STATUS RESPIRATION    Supervision limited        Personal Care Assistance Level of Assistance  Dressing, Bathing Bathing Assistance: Limited assistance   Dressing Assistance: Limited assistance      Functional Limitations Info                SPECIAL CARE FACTORS FREQUENCY                      Additional Factors Info  Code Status, Allergies Code Status Info:  (Full Code) Allergies Info: No Known Allergies   Insulin Sliding Scale Info: 3x a day       Current Medications (06/11/2015): Current Facility-Administered Medications  Medication Dose Route Frequency Provider Last Rate Last Dose  . acetaminophen (TYLENOL) tablet 650 mg  650 mg Oral Q6H PRN Orson Eva, MD   650 mg at 06/06/15 1609  . aspirin EC tablet 81 mg  81 mg Oral Daily Edwin Dada, MD   81 mg at 06/11/15 1010  . budesonide-formoterol (SYMBICORT) 80-4.5 MCG/ACT inhaler 2 puff  2 puff Inhalation BID Edwin Dada, MD   2 puff at 06/11/15 8315  . enoxaparin (LOVENOX) injection 40 mg  40 mg Subcutaneous Q24H Orson Eva, MD   40 mg at 06/11/15 1254  . gabapentin (NEURONTIN) capsule 600 mg  600 mg Oral TID Edwin Dada, MD   600 mg at 06/11/15 1010  . HYDROcodone-acetaminophen (NORCO/VICODIN) 5-325 MG per tablet 1 tablet  1 tablet Oral Q6H PRN Orson Eva,  MD   1 tablet at 06/11/15 1254  . insulin aspart (novoLOG) injection 0-15 Units  0-15 Units Subcutaneous TID WC Orson Eva, MD   5 Units at 06/11/15 1254  . insulin aspart (novoLOG) injection 0-5 Units  0-5 Units Subcutaneous QHS Orson Eva, MD   2 Units at 06/10/15 2248  . insulin aspart (novoLOG) injection 4 Units  4 Units Subcutaneous TID WC Orson Eva, MD   4 Units at 06/11/15 1259  . insulin glargine (LANTUS) injection 10 Units  10 Units Subcutaneous QHS David Tat, MD      . ipratropium-albuterol (DUONEB) 0.5-2.5 (3) MG/3ML nebulizer solution 3 mL  3 mL Nebulization Q6H PRN Orson Eva, MD      . lamoTRIgine (LAMICTAL)  tablet 125 mg  125 mg Oral BID Edwin Dada, MD   125 mg at 06/11/15 1255  . LORazepam (ATIVAN) tablet 0.5 mg  0.5 mg Oral Q6H PRN Orson Eva, MD   0.5 mg at 06/11/15 1254  . QUEtiapine (SEROQUEL XR) 24 hr tablet 800 mg  800 mg Oral QHS Edwin Dada, MD   800 mg at 06/10/15 2338  . simvastatin (ZOCOR) tablet 40 mg  40 mg Oral q1800 Edwin Dada, MD   40 mg at 06/10/15 1738  . zolpidem (AMBIEN) tablet 5 mg  5 mg Oral QHS Edwin Dada, MD   5 mg at 06/10/15 2154   Do not use this list as official medication orders. Please verify with discharge summary.  Discharge Medications:   Medication List    STOP taking these medications        DULoxetine 60 MG capsule  Commonly known as:  CYMBALTA     lisinopril 5 MG tablet  Commonly known as:  PRINIVIL,ZESTRIL      TAKE these medications        acetaminophen 325 MG tablet  Commonly known as:  TYLENOL  Take 325 mg by mouth 3 (three) times daily. Prior to fish oil     albuterol 108 (90 BASE) MCG/ACT inhaler  Commonly known as:  PROVENTIL HFA;VENTOLIN HFA  Inhale 2 puffs into the lungs every 4 (four) hours as needed for wheezing or shortness of breath.     aspirin EC 81 MG tablet  Take 81 mg by mouth daily.     budesonide-formoterol 80-4.5 MCG/ACT inhaler  Commonly known as:  SYMBICORT  Inhale 2 puffs into the lungs 2 (two) times daily.     calcium-vitamin D 500-200 MG-UNIT tablet  Commonly known as:  OSCAL WITH D  Take 1 tablet by mouth 2 (two) times daily.     cholecalciferol 1000 UNITS tablet  Commonly known as:  VITAMIN D  Take 1,000 Units by mouth daily.     docusate sodium 100 MG capsule  Commonly known as:  COLACE  Take 100 mg by mouth 2 (two) times daily.     gabapentin 300 MG capsule  Commonly known as:  NEURONTIN  Take 600 mg by mouth 3 (three) times daily.     guaifenesin 100 MG/5ML syrup  Commonly known as:  ROBITUSSIN  Take 200 mg by mouth every 4 (four) hours as needed for  cough.     insulin aspart 100 UNIT/ML injection  Commonly known as:  novoLOG  Inject 5 Units into the skin 3 (three) times daily with meals.     insulin glargine 100 UNIT/ML injection  Commonly known as:  LANTUS  Inject 0.1 mLs (10 Units total) into the skin  at bedtime.     ipratropium-albuterol 0.5-2.5 (3) MG/3ML Soln  Commonly known as:  DUONEB  Take 3 mLs by nebulization every 6 (six) hours as needed.     lamoTRIgine 25 MG tablet  Commonly known as:  LAMICTAL  Take 25 mg by mouth 2 (two) times daily. Takes with '100mg'$  tablet     LORazepam 0.5 MG tablet  Commonly known as:  ATIVAN  Take 1 tablet (0.5 mg total) by mouth every 8 (eight) hours as needed for anxiety.     metFORMIN 1000 MG tablet  Commonly known as:  GLUCOPHAGE  Take 1 tablet (1,000 mg total) by mouth 2 (two) times daily with a meal.     niacin 750 MG CR tablet  Commonly known as:  NIASPAN  Take 750 mg by mouth at bedtime.     omeprazole 20 MG capsule  Commonly known as:  PRILOSEC  Take 20 mg by mouth daily before supper.     promethazine 12.5 MG tablet  Commonly known as:  PHENERGAN  Take 12.5 mg by mouth every 6 (six) hours as needed for nausea or vomiting.     QUEtiapine 400 MG 24 hr tablet  Commonly known as:  SEROQUEL XR  Take 800 mg by mouth at bedtime.     simvastatin 40 MG tablet  Commonly known as:  ZOCOR  Take 40 mg by mouth daily at 6 PM.     tiotropium 18 MCG inhalation capsule  Commonly known as:  SPIRIVA  Place 18 mcg into inhaler and inhale daily.     zolpidem 10 MG tablet  Commonly known as:  AMBIEN  Take 5 mg by mouth at bedtime.        Relevant Imaging Results:  Relevant Lab Results:  Recent Labs    Additional Information    Dabney Dever, Jones Broom, LCSWA

## 2015-06-11 NOTE — Progress Notes (Signed)
Pharmacist Provided - Patient Medication Education Prior to Discharge   Sarah Leonard is an 54 y.o. female who presented to [facility] on 06/05/2015 with a chief complaint of  Chief Complaint  Patient presents with  . Weakness  . Shortness of Breath    '[x]'$  Patient being discharged without any new medications  The following medications were discussed with the patient: stopping duloxetine and lisinopril  Pain Control medications: '[]'$  Yes    '[]'$  No  Diabetes Medications: '[]'$  Yes    '[]'$  No  Heart Failure Medications: '[]'$  Yes    '[]'$  No  Anticoagulation Medications:  '[]'$  Yes    '[]'$  No  Antibiotics at discharge: '[]'$  Yes    '[]'$  No  Allergy Assessment Completed and Updated: '[x]'$  Yes    '[]'$  No Identified Patient Allergies: No Known Allergies    Barriers to Obtaining Medications: '[x]'$  Yes '[]'$  No  Lennox D Ringler expressed concern with obtaining medication, pt reported that she was not being giving her inhaled medications at her assisted living facility. I reviewed these COPD medication with the patient and how they should be taken. I encourage the patient to request these daily if they were not being given to her when needed  Assessment: I reviewed the medication that were being stopped with the patient and reviewed the importance of her COPD and DM medications.  Time spent preparing for discharge counseling: 15 mins Time spent counseling patient: 10 mins  Darl Pikes, PharmD Clinical Pharmacist- Resident Pager: [phone]  Darl Pikes, PharmD 06/11/2015, 12:53 PM

## 2015-06-17 ENCOUNTER — Encounter (HOSPITAL_COMMUNITY): Payer: Self-pay | Admitting: *Deleted

## 2015-06-17 ENCOUNTER — Inpatient Hospital Stay (HOSPITAL_COMMUNITY): Payer: Medicaid Other

## 2015-06-17 ENCOUNTER — Inpatient Hospital Stay (HOSPITAL_COMMUNITY)
Admission: EM | Admit: 2015-06-17 | Discharge: 2015-06-22 | DRG: 629 | Disposition: A | Discharge status: Nursing Home (Includes ICF) | Payer: Medicaid Other | Attending: Family Medicine | Admitting: Family Medicine

## 2015-06-17 DIAGNOSIS — E785 Hyperlipidemia, unspecified: Secondary | ICD-10-CM | POA: Diagnosis present

## 2015-06-17 DIAGNOSIS — Z7982 Long term (current) use of aspirin: Secondary | ICD-10-CM

## 2015-06-17 DIAGNOSIS — I10 Essential (primary) hypertension: Secondary | ICD-10-CM | POA: Diagnosis present

## 2015-06-17 DIAGNOSIS — M549 Dorsalgia, unspecified: Secondary | ICD-10-CM | POA: Diagnosis present

## 2015-06-17 DIAGNOSIS — J9610 Chronic respiratory failure, unspecified whether with hypoxia or hypercapnia: Secondary | ICD-10-CM | POA: Diagnosis present

## 2015-06-17 DIAGNOSIS — E871 Hypo-osmolality and hyponatremia: Secondary | ICD-10-CM | POA: Diagnosis present

## 2015-06-17 DIAGNOSIS — G8929 Other chronic pain: Secondary | ICD-10-CM | POA: Diagnosis present

## 2015-06-17 DIAGNOSIS — D649 Anemia, unspecified: Secondary | ICD-10-CM | POA: Diagnosis present

## 2015-06-17 DIAGNOSIS — E222 Syndrome of inappropriate secretion of antidiuretic hormone: Principal | ICD-10-CM | POA: Diagnosis present

## 2015-06-17 DIAGNOSIS — R918 Other nonspecific abnormal finding of lung field: Secondary | ICD-10-CM

## 2015-06-17 DIAGNOSIS — Z7951 Long term (current) use of inhaled steroids: Secondary | ICD-10-CM

## 2015-06-17 DIAGNOSIS — Z833 Family history of diabetes mellitus: Secondary | ICD-10-CM | POA: Diagnosis not present

## 2015-06-17 DIAGNOSIS — Z6841 Body Mass Index (BMI) 40.0 and over, adult: Secondary | ICD-10-CM

## 2015-06-17 DIAGNOSIS — Z7984 Long term (current) use of oral hypoglycemic drugs: Secondary | ICD-10-CM | POA: Diagnosis not present

## 2015-06-17 DIAGNOSIS — R1033 Periumbilical pain: Secondary | ICD-10-CM

## 2015-06-17 DIAGNOSIS — F17210 Nicotine dependence, cigarettes, uncomplicated: Secondary | ICD-10-CM | POA: Diagnosis present

## 2015-06-17 DIAGNOSIS — R112 Nausea with vomiting, unspecified: Secondary | ICD-10-CM | POA: Diagnosis present

## 2015-06-17 DIAGNOSIS — R590 Localized enlarged lymph nodes: Secondary | ICD-10-CM | POA: Insufficient documentation

## 2015-06-17 DIAGNOSIS — F319 Bipolar disorder, unspecified: Secondary | ICD-10-CM | POA: Diagnosis present

## 2015-06-17 DIAGNOSIS — Z720 Tobacco use: Secondary | ICD-10-CM | POA: Diagnosis present

## 2015-06-17 DIAGNOSIS — R911 Solitary pulmonary nodule: Secondary | ICD-10-CM | POA: Diagnosis not present

## 2015-06-17 DIAGNOSIS — F429 Obsessive-compulsive disorder, unspecified: Secondary | ICD-10-CM | POA: Diagnosis present

## 2015-06-17 DIAGNOSIS — E669 Obesity, unspecified: Secondary | ICD-10-CM | POA: Diagnosis present

## 2015-06-17 DIAGNOSIS — A084 Viral intestinal infection, unspecified: Secondary | ICD-10-CM | POA: Diagnosis not present

## 2015-06-17 DIAGNOSIS — E1165 Type 2 diabetes mellitus with hyperglycemia: Secondary | ICD-10-CM | POA: Diagnosis present

## 2015-06-17 DIAGNOSIS — J9611 Chronic respiratory failure with hypoxia: Secondary | ICD-10-CM | POA: Diagnosis present

## 2015-06-17 DIAGNOSIS — E875 Hyperkalemia: Secondary | ICD-10-CM | POA: Diagnosis present

## 2015-06-17 DIAGNOSIS — R197 Diarrhea, unspecified: Secondary | ICD-10-CM | POA: Diagnosis not present

## 2015-06-17 DIAGNOSIS — J441 Chronic obstructive pulmonary disease with (acute) exacerbation: Secondary | ICD-10-CM | POA: Diagnosis present

## 2015-06-17 DIAGNOSIS — Z794 Long term (current) use of insulin: Secondary | ICD-10-CM

## 2015-06-17 DIAGNOSIS — R109 Unspecified abdominal pain: Secondary | ICD-10-CM

## 2015-06-17 DIAGNOSIS — K219 Gastro-esophageal reflux disease without esophagitis: Secondary | ICD-10-CM | POA: Diagnosis present

## 2015-06-17 DIAGNOSIS — R599 Enlarged lymph nodes, unspecified: Secondary | ICD-10-CM | POA: Diagnosis not present

## 2015-06-17 DIAGNOSIS — T380X5A Adverse effect of glucocorticoids and synthetic analogues, initial encounter: Secondary | ICD-10-CM | POA: Diagnosis present

## 2015-06-17 DIAGNOSIS — Z9981 Dependence on supplemental oxygen: Secondary | ICD-10-CM | POA: Diagnosis not present

## 2015-06-17 DIAGNOSIS — J449 Chronic obstructive pulmonary disease, unspecified: Secondary | ICD-10-CM | POA: Diagnosis present

## 2015-06-17 LAB — CBC WITH DIFFERENTIAL/PLATELET
BASOS PCT: 0 %
Basophils Absolute: 0 10*3/uL (ref 0.0–0.1)
EOS ABS: 0.1 10*3/uL (ref 0.0–0.7)
EOS PCT: 1 %
HEMATOCRIT: 32.7 % — AB (ref 36.0–46.0)
HEMOGLOBIN: 11.8 g/dL — AB (ref 12.0–15.0)
LYMPHS ABS: 2.3 10*3/uL (ref 0.7–4.0)
Lymphocytes Relative: 24 %
MCH: 29.8 pg (ref 26.0–34.0)
MCHC: 36.1 g/dL — AB (ref 30.0–36.0)
MCV: 82.6 fL (ref 78.0–100.0)
Monocytes Absolute: 0.4 10*3/uL (ref 0.1–1.0)
Monocytes Relative: 4 %
NEUTROS PCT: 71 %
Neutro Abs: 6.7 10*3/uL (ref 1.7–7.7)
Platelets: 345 10*3/uL (ref 150–400)
RBC: 3.96 MIL/uL (ref 3.87–5.11)
RDW: 12.9 % (ref 11.5–15.5)
WBC: 9.6 10*3/uL (ref 4.0–10.5)

## 2015-06-17 LAB — BASIC METABOLIC PANEL
ANION GAP: 11 (ref 5–15)
Anion gap: 13 (ref 5–15)
BUN: <5 mg/dL — ABNORMAL LOW (ref 6–20)
CALCIUM: 8.4 mg/dL — AB (ref 8.9–10.3)
CHLORIDE: 77 mmol/L — AB (ref 101–111)
CO2: 25 mmol/L (ref 22–32)
CO2: 24 mmol/L (ref 22–32)
CREATININE: 0.43 mg/dL — AB (ref 0.44–1.00)
CREATININE: 0.58 mg/dL (ref 0.44–1.00)
Calcium: 8.5 mg/dL — ABNORMAL LOW (ref 8.9–10.3)
Chloride: 80 mmol/L — ABNORMAL LOW (ref 101–111)
GFR calc non Af Amer: >60 mL/min (ref >60)
Glucose, Bld: 188 mg/dL — ABNORMAL HIGH (ref 65–99)
Glucose, Bld: 224 mg/dL — ABNORMAL HIGH (ref 65–99)
POTASSIUM: 3.8 mmol/L (ref 3.5–5.1)
Potassium: 3.5 mmol/L (ref 3.5–5.1)
SODIUM: 116 mmol/L — AB (ref 135–145)
Sodium: 114 mmol/L — CL (ref 135–145)

## 2015-06-17 LAB — COMPREHENSIVE METABOLIC PANEL
ALBUMIN: 3.7 g/dL (ref 3.5–5.0)
ALK PHOS: 98 U/L (ref 38–126)
ALT: 19 U/L (ref 14–54)
ANION GAP: 16 — AB (ref 5–15)
AST: 31 U/L (ref 15–41)
BILIRUBIN TOTAL: 0.6 mg/dL (ref 0.3–1.2)
BUN: <5 mg/dL — ABNORMAL LOW (ref 6–20)
CHLORIDE: 77 mmol/L — AB (ref 101–111)
CO2: 22 mmol/L (ref 22–32)
CREATININE: 0.46 mg/dL (ref 0.44–1.00)
Calcium: 9.3 mg/dL (ref 8.9–10.3)
Glucose, Bld: 214 mg/dL — ABNORMAL HIGH (ref 65–99)
POTASSIUM: 3.7 mmol/L (ref 3.5–5.1)
Sodium: 115 mmol/L — CL (ref 135–145)
Total Protein: 7.2 g/dL (ref 6.5–8.1)

## 2015-06-17 LAB — MRSA PCR SCREENING: MRSA BY PCR: NEGATIVE

## 2015-06-17 LAB — OSMOLALITY, URINE: Osmolality, Ur: 395 mOsm/kg (ref 390–1090)

## 2015-06-17 LAB — C DIFFICILE QUICK SCREEN W PCR REFLEX
C DIFFICILE (CDIFF) INTERP: NEGATIVE
C DIFFICILE (CDIFF) TOXIN: NEGATIVE
C DIFFICLE (CDIFF) ANTIGEN: NEGATIVE

## 2015-06-17 LAB — GLUCOSE, CAPILLARY
GLUCOSE-CAPILLARY: 188 mg/dL — AB (ref 65–99)
Glucose-Capillary: 252 mg/dL — ABNORMAL HIGH (ref 65–99)
Glucose-Capillary: 288 mg/dL — ABNORMAL HIGH (ref 65–99)
Glucose-Capillary: 190 mg/dL — ABNORMAL HIGH (ref 65–99)

## 2015-06-17 LAB — INFLUENZA PANEL BY PCR (TYPE A & B)
H1N1FLUPCR: NOT DETECTED
INFLAPCR: NEGATIVE
INFLBPCR: NEGATIVE

## 2015-06-17 LAB — RAPID URINE DRUG SCREEN, HOSP PERFORMED
AMPHETAMINES: NOT DETECTED
BENZODIAZEPINES: NOT DETECTED
Barbiturates: NOT DETECTED
COCAINE: NOT DETECTED
OPIATES: NOT DETECTED
Tetrahydrocannabinol: NOT DETECTED

## 2015-06-17 LAB — APTT: aPTT: 32 seconds (ref 24–37)

## 2015-06-17 LAB — STREP PNEUMONIAE URINARY ANTIGEN: Strep Pneumo Urinary Antigen: NEGATIVE

## 2015-06-17 LAB — MAGNESIUM: Magnesium: 1 mg/dL — ABNORMAL LOW (ref 1.7–2.4)

## 2015-06-17 LAB — TSH: TSH: 1.542 u[IU]/mL (ref 0.350–4.500)

## 2015-06-17 LAB — OSMOLALITY: OSMOLALITY: 242 mosm/kg — AB (ref 275–300)

## 2015-06-17 LAB — PROTIME-INR
INR: 1.05 (ref 0.00–1.49)
Prothrombin Time: 13.9 seconds (ref 11.6–15.2)

## 2015-06-17 LAB — LIPASE, BLOOD: LIPASE: 25 U/L (ref 11–51)

## 2015-06-17 LAB — SODIUM, URINE, RANDOM: Sodium, Ur: 121 mmol/L

## 2015-06-17 MED ORDER — SIMVASTATIN 40 MG PO TABS
40.0000 mg | ORAL_TABLET | Freq: Every day | ORAL | Status: DC
  Administered 2015-06-17 – 2015-06-21 (×5): 40 mg via ORAL
  Filled 2015-06-17 (×5): qty 1

## 2015-06-17 MED ORDER — AZITHROMYCIN 500 MG PO TABS
250.0000 mg | ORAL_TABLET | Freq: Every day | ORAL | Status: AC
  Administered 2015-06-18 – 2015-06-21 (×4): 250 mg via ORAL
  Filled 2015-06-17 (×4): qty 1

## 2015-06-17 MED ORDER — FUROSEMIDE 10 MG/ML IJ SOLN
40.0000 mg | Freq: Once | INTRAMUSCULAR | Status: AC
  Administered 2015-06-17: 40 mg via INTRAVENOUS
  Filled 2015-06-17: qty 4

## 2015-06-17 MED ORDER — NIACIN ER (ANTIHYPERLIPIDEMIC) 750 MG PO TBCR: 750.0000 mg | EXTENDED_RELEASE_TABLET | Freq: Every day | ORAL | Status: DC

## 2015-06-17 MED ORDER — MAGNESIUM SULFATE 2 GM/50ML IV SOLN
2.0000 g | Freq: Once | INTRAVENOUS | Status: AC
  Administered 2015-06-17: 2 g via INTRAVENOUS
  Filled 2015-06-17: qty 50

## 2015-06-17 MED ORDER — TIOTROPIUM BROMIDE MONOHYDRATE 18 MCG IN CAPS: 18.0000 ug | ORAL_CAPSULE | Freq: Every day | RESPIRATORY_TRACT | Status: DC

## 2015-06-17 MED ORDER — INSULIN GLARGINE 100 UNIT/ML ~~LOC~~ SOLN
10.0000 [IU] | Freq: Every day | SUBCUTANEOUS | Status: DC
  Administered 2015-06-17: 10 [IU] via SUBCUTANEOUS
  Filled 2015-06-17 (×2): qty 0.1

## 2015-06-17 MED ORDER — LORAZEPAM 0.5 MG PO TABS
0.5000 mg | ORAL_TABLET | Freq: Three times a day (TID) | ORAL | Status: DC | PRN
  Administered 2015-06-17 – 2015-06-22 (×8): 0.5 mg via ORAL
  Filled 2015-06-17 (×10): qty 1

## 2015-06-17 MED ORDER — ACETAMINOPHEN 325 MG PO TABS
325.0000 mg | ORAL_TABLET | Freq: Three times a day (TID) | ORAL | Status: DC
  Administered 2015-06-17 – 2015-06-22 (×12): 325 mg via ORAL
  Filled 2015-06-17 (×14): qty 1

## 2015-06-17 MED ORDER — POTASSIUM CHLORIDE CRYS ER 20 MEQ PO TBCR
40.0000 meq | EXTENDED_RELEASE_TABLET | Freq: Once | ORAL | Status: AC
  Administered 2015-06-17: 40 meq via ORAL
  Filled 2015-06-17: qty 2

## 2015-06-17 MED ORDER — DM-GUAIFENESIN ER 30-600 MG PO TB12
1.0000 | ORAL_TABLET | Freq: Two times a day (BID) | ORAL | Status: DC
  Administered 2015-06-17 – 2015-06-22 (×11): 1 via ORAL
  Filled 2015-06-17 (×11): qty 1

## 2015-06-17 MED ORDER — NIACIN ER 500 MG PO CPCR
750.0000 mg | ORAL_CAPSULE | Freq: Every day | ORAL | Status: DC
  Administered 2015-06-17 – 2015-06-21 (×5): 750 mg via ORAL
  Filled 2015-06-17 (×7): qty 1

## 2015-06-17 MED ORDER — ASPIRIN EC 81 MG PO TBEC
81.0000 mg | DELAYED_RELEASE_TABLET | Freq: Every day | ORAL | Status: DC
  Administered 2015-06-17 – 2015-06-22 (×6): 81 mg via ORAL
  Filled 2015-06-17 (×6): qty 1

## 2015-06-17 MED ORDER — CALCIUM CARBONATE-VITAMIN D 500-200 MG-UNIT PO TABS
1.0000 | ORAL_TABLET | Freq: Two times a day (BID) | ORAL | Status: DC
  Administered 2015-06-17 – 2015-06-22 (×11): 1 via ORAL
  Filled 2015-06-17 (×10): qty 1

## 2015-06-17 MED ORDER — ALBUTEROL SULFATE (2.5 MG/3ML) 0.083% IN NEBU: 2.5000 mg | INHALATION_SOLUTION | RESPIRATORY_TRACT | Status: DC | PRN

## 2015-06-17 MED ORDER — ACETAMINOPHEN 325 MG PO TABS
650.0000 mg | ORAL_TABLET | Freq: Four times a day (QID) | ORAL | Status: DC | PRN
  Administered 2015-06-18: 650 mg via ORAL

## 2015-06-17 MED ORDER — HEPARIN SODIUM (PORCINE) 5000 UNIT/ML IJ SOLN
5000.0000 [IU] | Freq: Three times a day (TID) | INTRAMUSCULAR | Status: DC
  Administered 2015-06-17 – 2015-06-22 (×15): 5000 [IU] via SUBCUTANEOUS
  Filled 2015-06-17 (×11): qty 1

## 2015-06-17 MED ORDER — SODIUM CHLORIDE 0.9 % IJ SOLN
3.0000 mL | Freq: Two times a day (BID) | INTRAMUSCULAR | Status: DC
  Administered 2015-06-17 – 2015-06-22 (×10): 3 mL via INTRAVENOUS

## 2015-06-17 MED ORDER — IOHEXOL 300 MG/ML  SOLN
100.0000 mL | Freq: Once | INTRAMUSCULAR | Status: AC | PRN
  Administered 2015-06-17: 100 mL via INTRAVENOUS

## 2015-06-17 MED ORDER — GUAIFENESIN 100 MG/5ML PO SYRP: 200.0000 mg | ORAL_SOLUTION | ORAL | Status: DC | PRN

## 2015-06-17 MED ORDER — ONDANSETRON HCL 4 MG/2ML IJ SOLN
4.0000 mg | Freq: Three times a day (TID) | INTRAMUSCULAR | Status: DC | PRN
  Administered 2015-06-17 (×2): 4 mg via INTRAVENOUS
  Filled 2015-06-17 (×2): qty 2

## 2015-06-17 MED ORDER — SODIUM CHLORIDE 0.9 % IV SOLN
INTRAVENOUS | Status: DC
  Administered 2015-06-17: 14:00:00 via INTRAVENOUS

## 2015-06-17 MED ORDER — TOLVAPTAN 15 MG PO TABS
15.0000 mg | ORAL_TABLET | ORAL | Status: DC
  Administered 2015-06-17: 15 mg via ORAL
  Filled 2015-06-17 (×2): qty 1

## 2015-06-17 MED ORDER — IPRATROPIUM-ALBUTEROL 0.5-2.5 (3) MG/3ML IN SOLN
3.0000 mL | RESPIRATORY_TRACT | Status: DC
  Administered 2015-06-17 – 2015-06-18 (×5): 3 mL via RESPIRATORY_TRACT
  Filled 2015-06-17 (×5): qty 3

## 2015-06-17 MED ORDER — AZITHROMYCIN 500 MG PO TABS
500.0000 mg | ORAL_TABLET | Freq: Every day | ORAL | Status: AC
  Administered 2015-06-17: 500 mg via ORAL
  Filled 2015-06-17: qty 1

## 2015-06-17 MED ORDER — LORAZEPAM 2 MG/ML IJ SOLN
1.0000 mg | Freq: Once | INTRAMUSCULAR | Status: AC
  Administered 2015-06-17: 1 mg via INTRAVENOUS
  Filled 2015-06-17: qty 1

## 2015-06-17 MED ORDER — DICYCLOMINE HCL 10 MG PO CAPS
20.0000 mg | ORAL_CAPSULE | Freq: Once | ORAL | Status: AC
  Administered 2015-06-17: 20 mg via ORAL
  Filled 2015-06-17: qty 2

## 2015-06-17 MED ORDER — NICOTINE 21 MG/24HR TD PT24
21.0000 mg | MEDICATED_PATCH | Freq: Every day | TRANSDERMAL | Status: DC
  Administered 2015-06-17 – 2015-06-18 (×2): 21 mg via TRANSDERMAL
  Filled 2015-06-17 (×5): qty 1

## 2015-06-17 MED ORDER — QUETIAPINE FUMARATE ER 400 MG PO TB24
800.0000 mg | ORAL_TABLET | Freq: Every day | ORAL | Status: DC
  Administered 2015-06-17 – 2015-06-21 (×5): 800 mg via ORAL
  Filled 2015-06-17 (×7): qty 2

## 2015-06-17 MED ORDER — ZOLPIDEM TARTRATE 5 MG PO TABS
5.0000 mg | ORAL_TABLET | Freq: Every evening | ORAL | Status: DC | PRN
  Administered 2015-06-17 – 2015-06-21 (×4): 5 mg via ORAL
  Filled 2015-06-17 (×4): qty 1

## 2015-06-17 MED ORDER — METHYLPREDNISOLONE SODIUM SUCC 40 MG IJ SOLR
40.0000 mg | Freq: Two times a day (BID) | INTRAMUSCULAR | Status: DC
  Administered 2015-06-17 – 2015-06-18 (×2): 40 mg via INTRAVENOUS
  Filled 2015-06-17 (×2): qty 1

## 2015-06-17 MED ORDER — ENSURE ENLIVE PO LIQD
237.0000 mL | Freq: Two times a day (BID) | ORAL | Status: DC
  Administered 2015-06-17 – 2015-06-18 (×3): 237 mL via ORAL

## 2015-06-17 MED ORDER — VITAMIN D3 25 MCG (1000 UNIT) PO TABS
1000.0000 [IU] | ORAL_TABLET | Freq: Every day | ORAL | Status: DC
  Administered 2015-06-17 – 2015-06-22 (×6): 1000 [IU] via ORAL
  Filled 2015-06-17 (×12): qty 1

## 2015-06-17 MED ORDER — INSULIN ASPART 100 UNIT/ML ~~LOC~~ SOLN
0.0000 [IU] | Freq: Three times a day (TID) | SUBCUTANEOUS | Status: DC
Start: 2015-06-17 — End: 2015-06-18
  Administered 2015-06-17 (×2): 5 [IU] via SUBCUTANEOUS
  Administered 2015-06-17: 2 [IU] via SUBCUTANEOUS
  Administered 2015-06-18: 3 [IU] via SUBCUTANEOUS
  Administered 2015-06-18: 7 [IU] via SUBCUTANEOUS

## 2015-06-17 MED ORDER — FAMOTIDINE IN NACL 20-0.9 MG/50ML-% IV SOLN
20.0000 mg | Freq: Two times a day (BID) | INTRAVENOUS | Status: DC
  Administered 2015-06-17 – 2015-06-18 (×3): 20 mg via INTRAVENOUS
  Filled 2015-06-17 (×6): qty 50

## 2015-06-17 MED ORDER — IOHEXOL 300 MG/ML  SOLN
25.0000 mL | INTRAMUSCULAR | Status: AC
  Administered 2015-06-17 (×2): 25 mL via ORAL

## 2015-06-17 MED ORDER — SODIUM CHLORIDE 0.9 % IV SOLN
INTRAVENOUS | Status: DC
  Administered 2015-06-17: 05:00:00 via INTRAVENOUS

## 2015-06-17 MED ORDER — ONDANSETRON HCL 4 MG/2ML IJ SOLN
4.0000 mg | Freq: Once | INTRAMUSCULAR | Status: AC
  Administered 2015-06-17: 4 mg via INTRAVENOUS
  Filled 2015-06-17: qty 2

## 2015-06-17 MED ORDER — BUDESONIDE-FORMOTEROL FUMARATE 80-4.5 MCG/ACT IN AERO
2.0000 | INHALATION_SPRAY | Freq: Two times a day (BID) | RESPIRATORY_TRACT | Status: DC
  Administered 2015-06-17 – 2015-06-22 (×10): 2 via RESPIRATORY_TRACT
  Filled 2015-06-17 (×2): qty 6.9

## 2015-06-17 MED ORDER — LAMOTRIGINE 25 MG PO TABS: 25.0000 mg | ORAL_TABLET | Freq: Two times a day (BID) | ORAL | Status: DC

## 2015-06-17 MED ORDER — MORPHINE SULFATE (PF) 2 MG/ML IV SOLN
2.0000 mg | INTRAVENOUS | Status: DC | PRN
  Administered 2015-06-17 – 2015-06-22 (×13): 2 mg via INTRAVENOUS
  Filled 2015-06-17 (×14): qty 1

## 2015-06-17 MED ORDER — SODIUM CHLORIDE 0.9 % IV BOLUS (SEPSIS)
1000.0000 mL | Freq: Once | INTRAVENOUS | Status: AC
  Administered 2015-06-17: 1000 mL via INTRAVENOUS

## 2015-06-17 MED ORDER — METHYLPREDNISOLONE SODIUM SUCC 125 MG IJ SOLR
60.0000 mg | Freq: Two times a day (BID) | INTRAMUSCULAR | Status: DC
  Administered 2015-06-17: 60 mg via INTRAVENOUS
  Filled 2015-06-17: qty 2

## 2015-06-17 MED ORDER — ACETAMINOPHEN 650 MG RE SUPP: 650.0000 mg | Freq: Four times a day (QID) | RECTAL | Status: DC | PRN

## 2015-06-17 MED ORDER — LAMOTRIGINE 25 MG PO TABS
125.0000 mg | ORAL_TABLET | Freq: Two times a day (BID) | ORAL | Status: DC
  Administered 2015-06-17 – 2015-06-22 (×11): 125 mg via ORAL
  Filled 2015-06-17 (×13): qty 1

## 2015-06-17 MED ORDER — GABAPENTIN 300 MG PO CAPS
600.0000 mg | ORAL_CAPSULE | Freq: Three times a day (TID) | ORAL | Status: DC
  Administered 2015-06-17 – 2015-06-18 (×5): 600 mg via ORAL
  Filled 2015-06-17 (×2): qty 2
  Filled 2015-06-17: qty 6
  Filled 2015-06-17 (×2): qty 2

## 2015-06-17 NOTE — ED Notes (Signed)
Pt stated that "she had just peed". Pt unable to go to the bathroom. Janett Billow - RN aware.

## 2015-06-17 NOTE — Progress Notes (Signed)
Pt c/o nausea and dry heaving and HA refusing tylenol, morphine given. Will monitor.

## 2015-06-17 NOTE — Care Management Note (Signed)
Case Management Note  Patient Details  Name: Sarah Leonard MRN: [id number] Date of Birth: 1960/11/08  Subjective/Objective:    Pt is from [place].  CSW will follow.                           Expected Discharge Plan:  Assisted Living / Rest Home  In-House Referral:  Clinical Social Work  Status of Service:  In process, will continue to follow  Girard Cooter, RN 06/17/2015, 12:59 PM

## 2015-06-17 NOTE — ED Provider Notes (Signed)
CSN: [id number]   Arrival date & time 06/17/15 0143  History  By signing my name below, I, Altamease Oiler, attest that this documentation has been prepared under the direction and in the presence of Everlene Balls, MD. Electronically Signed: Altamease Oiler, ED Scribe. 06/17/2015. 4:06 AM.  No chief complaint on file.   HPI The history is provided by the patient. No language interpreter was used.   Sarah Leonard is a 54 y.o. female with history of pancreatitis, DM, bipolar 1 disorder, major depressive disorder, cocaine abuse, and alcohol abuse who presents to the Emergency Department complaining of nausea and vomiting with onset yesterday around 5 PM. Pt states that she has been vomiting "continuously". Associated symptoms include feeling hot, "a lot" of diarrhea, abdominal pain, and headache . Pt denies measured fever and difficulty urinating.   Past Medical History  Diagnosis Date  . Diabetes mellitus   . Pancreatitis   . Anemia   . Bipolar 1 disorder ([place])   . Alcohol abuse   . 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 COPD (chronic obstructive pulmonary disease) ([place])   . Hypotension   . Major depressive disorder ([place])   . Cocaine abuse   . Chronic back pain   . Renal disorder   . Acidosis   . Hyperkalemia   . Abscess   . Acute encephalopathy   . Septic shock [facility])     Past Surgical History  Procedure Laterality Date  . Cholecystectomy    . Appendectomy    . Tonsillectomy    . Incision and drainage abscess Right 03/01/2013    Procedure: INCISION AND DRAINAGE ABSCESS;  Surgeon: Harl Bowie, MD;  Location: [facility];  Service: General;  Laterality: Right;  . Cesarean section      Family History  Problem Relation Age of Onset  . Diabetes Mother   . Cancer Mother     breast    Social History  Substance Use Topics  . Smoking status: Current Every Day Smoker -- 0.00 packs/day    Types: Cigarettes  . Smokeless tobacco: Never Used  . Alcohol Use:  No     Comment: hx of etoh abuse     Review of Systems 10 Systems reviewed and all are negative for acute change except as noted in the HPI. Home Medications   Prior to Admission medications   Medication Sig Start Date End Date Taking? Authorizing Provider  acetaminophen (TYLENOL) 325 MG tablet Take 325 mg by mouth 3 (three) times daily. Prior to fish oil   Yes Historical Provider, MD  aspirin EC 81 MG tablet Take 81 mg by mouth daily.   Yes Historical Provider, MD  budesonide-formoterol (SYMBICORT) 80-4.5 MCG/ACT inhaler Inhale 2 puffs into the lungs 2 (two) times daily.   Yes Historical Provider, MD  calcium-vitamin D (OSCAL WITH D) 500-200 MG-UNIT tablet Take 1 tablet by mouth 2 (two) times daily.   Yes Historical Provider, MD  cholecalciferol (VITAMIN D) 1000 UNITS tablet Take 1,000 Units by mouth daily.   Yes Historical Provider, MD  docusate sodium (COLACE) 100 MG capsule Take 100 mg by mouth 2 (two) times daily.   Yes Historical Provider, MD  gabapentin (NEURONTIN) 300 MG capsule Take 600 mg by mouth 3 (three) times daily.   Yes Historical Provider, MD  insulin aspart (NOVOLOG) 100 UNIT/ML injection Inject 5 Units into the skin 3 (three) times daily with meals. 06/11/15  Yes Orson Eva, MD  insulin  glargine (LANTUS) 100 UNIT/ML injection Inject 0.1 mLs (10 Units total) into the skin at bedtime. 06/11/15  Yes Orson Eva, MD  lamoTRIgine (LAMICTAL) 100 MG tablet Take 100 mg by mouth 2 (two) times daily. Take with 25 mg tablet   Yes Historical Provider, MD  lamoTRIgine (LAMICTAL) 25 MG tablet Take 25 mg by mouth 2 (two) times daily. Takes with '100mg'$  tablet   Yes Historical Provider, MD  metFORMIN (GLUCOPHAGE) 1000 MG tablet Take 1 tablet (1,000 mg total) by mouth 2 (two) times daily with a meal. 11/29/14  Yes Nishant Dhungel, MD  niacin (NIASPAN) 750 MG CR tablet Take 750 mg by mouth at bedtime.   Yes Historical Provider, MD  omeprazole (PRILOSEC) 20 MG capsule Take 20 mg by mouth daily  before supper.    Yes Historical Provider, MD  QUEtiapine (SEROQUEL XR) 400 MG 24 hr tablet Take 800 mg by mouth at bedtime.   Yes Historical Provider, MD  simvastatin (ZOCOR) 40 MG tablet Take 40 mg by mouth daily at 6 PM.    Yes Historical Provider, MD  tiotropium (SPIRIVA) 18 MCG inhalation capsule Place 18 mcg into inhaler and inhale daily.   Yes Historical Provider, MD  zolpidem (AMBIEN) 10 MG tablet Take 5 mg by mouth at bedtime.    Yes Historical Provider, MD  albuterol (PROVENTIL HFA;VENTOLIN HFA) 108 (90 BASE) MCG/ACT inhaler Inhale 2 puffs into the lungs every 4 (four) hours as needed for wheezing or shortness of breath. 91/4/78   Delora Fuel, MD  guaifenesin (ROBITUSSIN) 100 MG/5ML syrup Take 200 mg by mouth every 4 (four) hours as needed for cough.    Historical Provider, MD  ipratropium-albuterol (DUONEB) 0.5-2.5 (3) MG/3ML SOLN Take 3 mLs by nebulization every 6 (six) hours as needed. 06/11/15   Orson Eva, MD  LORazepam (ATIVAN) 0.5 MG tablet Take 1 tablet (0.5 mg total) by mouth every 8 (eight) hours as needed for anxiety. 11/29/14   Nishant Dhungel, MD  promethazine (PHENERGAN) 12.5 MG tablet Take 12.5 mg by mouth every 6 (six) hours as needed for nausea or vomiting.    Historical Provider, MD    Allergies  Review of patient's allergies indicates no known allergies.  Triage Vitals: There were no vitals taken for this visit.  Physical Exam  Constitutional: She is oriented to person, place, and time. She appears well-developed and well-nourished. No distress.  HENT:  Head: Normocephalic and atraumatic.  Nose: Nose normal.  Mouth/Throat: Oropharynx is clear and moist. No oropharyngeal exudate.  Eyes: Conjunctivae and EOM are normal. Pupils are equal, round, and reactive to light. No scleral icterus.  Neck: Normal range of motion. Neck supple. No JVD present. No tracheal deviation present. No thyromegaly present.  Cardiovascular: Normal rate, regular rhythm and normal heart  sounds.  Exam reveals no gallop and no friction rub.   No murmur heard. Pulmonary/Chest: Effort normal and breath sounds normal. No respiratory distress. She has no wheezes. She exhibits no tenderness.  Abdominal: Soft. Bowel sounds are normal. She exhibits no distension and no mass. There is no tenderness. There is no rebound and no guarding.  Musculoskeletal: Normal range of motion. She exhibits no edema or tenderness.  Lymphadenopathy:    She has no cervical adenopathy.  Neurological: She is alert and oriented to person, place, and time. No cranial nerve deficit. She exhibits normal muscle tone.  Skin: Skin is warm and dry. No rash noted. No erythema. No pallor.  Nursing note and vitals reviewed.   ED  Course  Procedures  COORDINATION OF CARE: 2:58 AM Discussed treatment plan which includes lab work with pt at bedside and pt agreed to plan.  Labs Reviewed  CBC WITH DIFFERENTIAL/PLATELET - Abnormal; Notable for the following:    Hemoglobin 11.8 (*)    HCT 32.7 (*)    MCHC 36.1 (*)    All other components within normal limits  COMPREHENSIVE METABOLIC PANEL - Abnormal; Notable for the following:    Sodium 115 (*)    Chloride 77 (*)    Glucose, Bld 214 (*)    BUN <5 (*)    Anion gap 16 (*)    All other components within normal limits  MAGNESIUM - Abnormal; Notable for the following:    Magnesium 1.0 (*)    All other components within normal limits  LIPASE, BLOOD  POC URINE PREG, ED    Imaging Review No results found.  I personally reviewed and evaluated these images and lab results as a part of my medical decision-making.   EKG Interpretation  Date/Time:  Sunday June 17 2015 03:43:18 EST Ventricular Rate:  78 PR Interval:  181 QRS Duration: 95 QT Interval:  408 QTC Calculation: 465 R Axis:   64 Text Interpretation:  Sinus rhythm No significant change since last tracing Confirmed by Glynn Octave [phone]) on 06/17/2015 4:06:21 AM    MDM   Final  diagnoses:  None   Patient presents emergency department for abdominal pain, nausea, vomiting, diarrhea. Lab results show a sodium of 115, chloride in the 70s. Patient has a critical hyponatremia. She was already given 1 L IV fluid bolus. Magnesium is 1.0, this is in place as well. Patient will require admission to step down unit for further care.  [facility] hospitalist was paged for admission.  I spoke with Dr. Porfirio Mylar and he will except the patient to telemetry.   CRITICAL CARE Performed by: Everlene Balls   Total critical care time: 45 minutes - symptomatic hyponatremia  Critical care time was exclusive of separately billable procedures and treating other patients.  Critical care was necessary to treat or prevent imminent or life-threatening deterioration.  Critical care was time spent personally by me on the following activities: development of treatment plan with patient and/or surrogate as well as nursing, discussions with consultants, evaluation of patient's response to treatment, examination of patient, obtaining history from patient or surrogate, ordering and performing treatments and interventions, ordering and review of laboratory studies, ordering and review of radiographic studies, pulse oximetry and re-evaluation of patient's condition.    I personally performed the services described in this documentation, which was scribed in my presence. The recorded information has been reviewed and is accurate.     Everlene Balls, MD 06/17/15 [phone]

## 2015-06-17 NOTE — H&P (Signed)
[facility] History and Physical  MASHELLE BUSICK YIR:[id number] DOB: Oct 22, 1960 DOA: 06/17/2015  Referring physician: ED physician PCP: Moreen Fowler  Specialists:   Chief Complaint: Nausea, vomiting, diarrhea, abdominal pain, cough and wheezing  HPI: Sarah Leonard is a 54 y.o. female with PMH of chronic hyponatremia, SIADH, diabetes mellitus, hyperlipidemia, COPD, GERD, depression, pancreatitis, bipolar disorder, alcohol abuse, buccal abuse, cocaine abuse, chronic back pain, who presents with nausea, vomiting, diarrhea, abdominal pain, cough and wheezing.  Patient reports that she started having nausea, vomiting, diarrhea and abdominal pain since yesterday. She vomited more than 10 times without blood in the vomitus. She has had 4-5 times of bowel movement with loose stools. She saw small amount of blood when wipes. She has moderate abdominal pain over the periumbilical area,which is constant, nonradiating. Does not have fever or chills. She reports that she has cough with white mucus production, shortness of breath and wheezing. She does not have chest pain, symptoms for UTI, unibilateral weakness. Patient reports that she has not had good sleep in the past 3 days. He feels very sleepy.  In ED, patient was found to have negative lipase, WBC 9.6, tachycardia, hyponatremia with sodium 126 on 06/11/15-->115, renal function okay. Patient is admitted to inpatient for further evaluation treatment.  Where does patient live?   At home   Can patient participate in ADLs?  Yes      Review of Systems:   General: no fevers, chills, no changes in body weight, has poor appetite, has fatigue HEENT: no blurry vision, hearing changes or sore throat Pulm: has dyspnea, coughing, wheezing CV: no chest pain, palpitations Abd: has nausea, vomiting, abdominal pain, diarrhea, no constipation GU: no dysuria, burning on urination, increased urinary frequency, hematuria  Ext: no leg edema Neuro: no  unilateral weakness, numbness, or tingling, no vision change or hearing loss Skin: no rash MSK: No muscle spasm, no deformity, no limitation of range of movement in spin Heme: No easy bruising.  Travel history: No recent long distant travel.  Allergy: No Known Allergies  Past Medical History  Diagnosis Date  . Diabetes mellitus   . Pancreatitis   . Anemia   . Bipolar 1 disorder ([place])   . Alcohol abuse   . 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 COPD (chronic obstructive pulmonary disease) ([place])   . Hypotension   . Major depressive disorder ([place])   . Cocaine abuse   . Chronic back pain   . Renal disorder   . Acidosis   . Hyperkalemia   . Abscess   . Acute encephalopathy   . Septic shock [facility])     Past Surgical History  Procedure Laterality Date  . Cholecystectomy    . Appendectomy    . Tonsillectomy    . Incision and drainage abscess Right 03/01/2013    Procedure: INCISION AND DRAINAGE ABSCESS;  Surgeon: Harl Bowie, MD;  Location: [facility];  Service: General;  Laterality: Right;  . Cesarean section      Social History:  reports that she has been smoking Cigarettes.  She has been smoking about 0.00 packs per day. She has never used smokeless tobacco. She reports that she uses illicit drugs (Cocaine). She reports that she does not drink alcohol.  Family History:  Family History  Problem Relation Age of Onset  . Diabetes Mother   . Cancer Mother     breast     Prior to Admission medications  Medication Sig Start Date End Date Taking? Authorizing Provider  acetaminophen (TYLENOL) 325 MG tablet Take 325 mg by mouth 3 (three) times daily. Prior to fish oil   Yes Historical Provider, MD  aspirin EC 81 MG tablet Take 81 mg by mouth daily.   Yes Historical Provider, MD  budesonide-formoterol (SYMBICORT) 80-4.5 MCG/ACT inhaler Inhale 2 puffs into the lungs 2 (two) times daily.   Yes Historical Provider, MD  calcium-vitamin D (OSCAL WITH D) 500-200  MG-UNIT tablet Take 1 tablet by mouth 2 (two) times daily.   Yes Historical Provider, MD  cholecalciferol (VITAMIN D) 1000 UNITS tablet Take 1,000 Units by mouth daily.   Yes Historical Provider, MD  docusate sodium (COLACE) 100 MG capsule Take 100 mg by mouth 2 (two) times daily.   Yes Historical Provider, MD  gabapentin (NEURONTIN) 300 MG capsule Take 600 mg by mouth 3 (three) times daily.   Yes Historical Provider, MD  insulin aspart (NOVOLOG) 100 UNIT/ML injection Inject 5 Units into the skin 3 (three) times daily with meals. 06/11/15  Yes Orson Eva, MD  insulin glargine (LANTUS) 100 UNIT/ML injection Inject 0.1 mLs (10 Units total) into the skin at bedtime. 06/11/15  Yes Orson Eva, MD  lamoTRIgine (LAMICTAL) 100 MG tablet Take 100 mg by mouth 2 (two) times daily. Take with 25 mg tablet   Yes Historical Provider, MD  lamoTRIgine (LAMICTAL) 25 MG tablet Take 25 mg by mouth 2 (two) times daily. Takes with '100mg'$  tablet   Yes Historical Provider, MD  metFORMIN (GLUCOPHAGE) 1000 MG tablet Take 1 tablet (1,000 mg total) by mouth 2 (two) times daily with a meal. 11/29/14  Yes Nishant Dhungel, MD  niacin (NIASPAN) 750 MG CR tablet Take 750 mg by mouth at bedtime.   Yes Historical Provider, MD  omeprazole (PRILOSEC) 20 MG capsule Take 20 mg by mouth daily before supper.    Yes Historical Provider, MD  QUEtiapine (SEROQUEL XR) 400 MG 24 hr tablet Take 800 mg by mouth at bedtime.   Yes Historical Provider, MD  simvastatin (ZOCOR) 40 MG tablet Take 40 mg by mouth daily at 6 PM.    Yes Historical Provider, MD  tiotropium (SPIRIVA) 18 MCG inhalation capsule Place 18 mcg into inhaler and inhale daily.   Yes Historical Provider, MD  zolpidem (AMBIEN) 10 MG tablet Take 5 mg by mouth at bedtime.    Yes Historical Provider, MD  albuterol (PROVENTIL HFA;VENTOLIN HFA) 108 (90 BASE) MCG/ACT inhaler Inhale 2 puffs into the lungs every 4 (four) hours as needed for wheezing or shortness of breath. 31/4/97   Delora Fuel, MD   guaifenesin (ROBITUSSIN) 100 MG/5ML syrup Take 200 mg by mouth every 4 (four) hours as needed for cough.    Historical Provider, MD  ipratropium-albuterol (DUONEB) 0.5-2.5 (3) MG/3ML SOLN Take 3 mLs by nebulization every 6 (six) hours as needed. 06/11/15   Orson Eva, MD  LORazepam (ATIVAN) 0.5 MG tablet Take 1 tablet (0.5 mg total) by mouth every 8 (eight) hours as needed for anxiety. 11/29/14   Nishant Dhungel, MD  promethazine (PHENERGAN) 12.5 MG tablet Take 12.5 mg by mouth every 6 (six) hours as needed for nausea or vomiting.    Historical Provider, MD    Physical Exam: Filed Vitals:   06/17/15 0415 06/17/15 0430 06/17/15 0445 06/17/15 0521  BP: 141/70 154/70  152/74  Pulse: 97 102 92 91  Temp:    97.7 F (36.5 C)  TempSrc:    Axillary  Resp: 18  $'17 15 18  'J$ Weight:    109.362 kg (241 lb 1.6 oz)  SpO2: 92% 96% 99% 100%   General: Not in acute distress HEENT:       Eyes: PERRL, EOMI, no scleral icterus.       ENT: No discharge from the ears and nose, no pharynx injection, no tonsillar enlargement.        Neck: No JVD, no bruit, no mass felt. Heme: No neck lymph node enlargement. Cardiac: S1/S2, RRR, No murmurs, No gallops or rubs. Pulm: has bilateral wheezing, No rales or rubs. Abd: Soft, nondistended, has tenderness over periumbilical area, no rebound pain, no organomegaly, BS present. Ext: No pitting leg edema bilaterally. 2+DP/PT pulse bilaterally. Musculoskeletal: No joint deformities, No joint redness or warmth, no limitation of ROM in spin. Skin: No rashes.  Neuro: Alert, sleepy, oriented X3, cranial nerves II-XII grossly intact, muscle strength 5/5 in all extremities, sensation to light touch intact. Brachial reflex 2+ bilaterally. Knee reflex 1+ bilaterally. Psych: Patient is not psychotic, no suicidal or hemocidal ideation.  Labs on Admission:  Basic Metabolic Panel:  Recent Labs Lab 06/10/15 1133 06/11/15 0715 06/17/15 0200  NA 127* 126* 115*  K 3.9 3.7 3.7  CL  92* 92* 77*  CO2 '27 24 22  '$ GLUCOSE 200* 238* 214*  BUN <5* 6 <5*  CREATININE 0.50 0.47 0.46  CALCIUM 8.6* 8.6* 9.3  MG  --   --  1.0*   Liver Function Tests:  Recent Labs Lab 06/17/15 0200  AST 31  ALT 19  ALKPHOS 98  BILITOT 0.6  PROT 7.2  ALBUMIN 3.7    Recent Labs Lab 06/17/15 0200  LIPASE 25   No results for input(s): AMMONIA in the last 168 hours. CBC:  Recent Labs Lab 06/17/15 0200  WBC 9.6  NEUTROABS 6.7  HGB 11.8*  HCT 32.7*  MCV 82.6  PLT 345   Cardiac Enzymes: No results for input(s): CKTOTAL, CKMB, CKMBINDEX, TROPONINI in the last 168 hours.  BNP (last 3 results)  Recent Labs  06/05/15 2050  BNP 20.5    ProBNP (last 3 results) No results for input(s): PROBNP in the last 8760 hours.  CBG:  Recent Labs Lab 06/10/15 1723 06/10/15 2152 06/11/15 0805 06/11/15 1239 06/11/15 1715  GLUCAP 220* 207* 245* 240* 197*    Radiological Exams on Admission: No results found.  EKG: Independently reviewed.  QTC 465, with ischemic change   Assessment/Plan Principal Problem:   Nausea vomiting and diarrhea Active Problems:   Hypertension   COPD exacerbation (HCC)   Tobacco abuse   Hyponatremia   Chronic respiratory failure (HCC)   Bipolar I disorder (HCC)   Type 2 diabetes mellitus with hyperglycemia (HCC)   Hyperlipidemia   SIADH (syndrome of inappropriate ADH production) (HCC)   Hypomagnesemia   GERD (gastroesophageal reflux disease)  Nausea, vomiting, diarrhea, abdominal pain: Etiology is not clear, likely due to viral gastroenteritis, but need to rule out other possibilities, such as C. difficile colitis. She reports that she noticed blood in stool, but hemoglobin is stable 11.2 on 06/06/15-->11.8.  -will admit to tele bed -When necessary Zofran for nausea, morphine for pain -Check C. difficile PCR. Stool culture -FOBT -INR and PTT -CT-abd/pelvis  Hyponatremia: This is worse than baseline from 126-->115.  The patient takes  quetiapine and lamotrigine, which could contribute to hyponatremia. Her ongoing nausea, vomiting and diarrhea may have made this problem worse. She is sleepy, but completely oriented. She reports that she did not have  good sleep in the past 3 days, likely no acute mental status change. I would not treat her too aggressively now. -Check urine and serum osmolality, and urine sodium level - check TSH - IVF: 1L NS and then 125 cc/h - follow up by BMP q4h  COPD exacerbation: Patient has cough and shortness of breath. Auscultation has bilateral wheezing, indicating mildly exacerbated COPD. -DuoNeb, Albuterol Nebs -Solumedrol 60 mg bid -Mucinex for cough -check flu pcr -Z pak -Continue Symbicort  Bipolar depression and depression: Stable. No suicidal or homicidal ideations. -Continue quetiapine and lamotrigine  Tobacco abuse: -Did counseling about importance of quitting smoking -Nicotine patch  DM-II: Last A1c 10.4 on 06/06/15, poorly controled. Patient is taking metformin, NovoLog, and Lantus at home. Blood sugar is 214 on admission -Continue home dose Lantus 10 units daily -SSI  HLD: Last LDL was 35 on 06/07/15  -Continue home medications: Zocor -Check FLP  Hypomagnesemia: Mg =1.0 -repleated  GERD: -will switch PPI to pepcid IV until C diff pcr negative  DVT ppx: SQ Heparin     Code Status: Full code Family Communication: None at bed side.    Disposition Plan: Admit to inpatient   Date of Service 06/17/2015    Ivor Costa [facility] Pager [phone]  If 7PM-7AM, please contact night-coverage [email or web address] Password [facility] 06/17/2015, 6:47 AM

## 2015-06-17 NOTE — Progress Notes (Signed)
Pt refused 2000hrs Duoneb/symbicort due to nausea.  Requested respiratory to return later for tx.

## 2015-06-17 NOTE — Progress Notes (Signed)
Utilization Review Completed.  

## 2015-06-17 NOTE — Progress Notes (Signed)
PROGRESS NOTE    Sarah Leonard GNF:[id number] DOB: 1961/08/01 DOA: 06/17/2015 PCP: Moreen Fowler  HPI/Brief narrative 54 year old female, ALF resident, history of chronic hyponatremia, SIADH, DM, HLD, COPD, chronic respiratory failure on home oxygen 3 L/m, GERD, depression/bipolar disorder, polysubstance abuse, chronic headache, chronic back pain hospitalized 10/25-10/31 for hyponatremia-felt to be secondary to SIADH with psych meds (Cymbalta, Lamictal and Seroquel contributing) presented to [facility] ED on 06/17/15 with complaints of nausea, nonbloody emesis, diarrhea, abdominal pain, nonproductive cough, dyspnea and wheezing. She states that her GI symptoms started 3 days back after she ate excess amounts of sour cream. Denies sickly contacts with similar symptoms. In ED, patient was found to have negative lipase, WBC 9.6, tachycardia, hyponatremia with sodium 126 on 06/11/15-->115, renal function okay. Patient is admitted to inpatient for further evaluation treatment.   Assessment/Plan:  Nausea, vomiting, diarrhea and some abdominal pain - DD: Possibly acute viral gastroenteritis. C. difficile testing negative. - Treat supportively with IV fluids, antiemetics and advance diet as tolerated. - Feels better.  Hyponatremia, subacute on chronic - Baseline sodium probably in the 120s. - May be related to dehydration from GI losses and SIADH. Her psych medications are also contribute in. - Does not appear symptomatic from the hyponatremia. - Continue gentle IV normal saline hydration and will provide a dose of IV Lasix 40 mg 1 (does appear mildly volume overloaded) - Follow BMP closely.  COPD exacerbation/chronic respiratory failure on home oxygen 3 L/m - Treated with oxygen, bronchodilator nebulizations, IV steroids - Improving. Reduce Solu-Medrol.  Bipolar disorder - Continue home psych meds. May need psych evaluation at some point to adjust her medications if this hyponatremia becomes a  recurrent problem.  Tobacco abuse - Cessation counseled. Continue nicotine patch.  Poorly controlled type II DM - A1c on 10/26:10.4. - Continue Lantus and SSI. Hold metformin. Currently uncontrolled secondary to steroids-reducing.  HLD - Continue statins  GERD - Continue IV Pepcid  Hypomagnesemia - replace and follow.  Anemia - Stable   DVT prophylaxis: subcut Heparin Code Status: Full Family Communication: None at bedside Disposition Plan: DC back to ALF when medically stable.   Consultants:  None  Procedures:  None  Antibiotics:  None   Subjective: Feels better. No dyspnea. Nausea, vomiting, diarrhea and abdominal pain improved.  Objective: Filed Vitals:   06/17/15 0521 06/17/15 0700 06/17/15 0832 06/17/15 0855  BP: 152/74  125/78   Pulse: 91  88   Temp: 97.7 F (36.5 C)  98.6 F (37 C)   TempSrc: Axillary  Oral   Resp: 18  17   Height:  '5\' 3"'$  (1.6 m)    Weight: 109.362 kg (241 lb 1.6 oz)     SpO2: 100%  98% 96%    Intake/Output Summary (Last 24 hours) at 06/17/15 1221 Last data filed at 06/17/15 1157  Gross per 24 hour  Intake 1206.25 ml  Output    900 ml  Net 306.25 ml   Filed Weights   06/17/15 0521  Weight: 109.362 kg (241 lb 1.6 oz)     Exam:  General exam: Pleasant middle-aged female, moderately built and obese, lying comfortably propped up in bed.  Respiratory system: Clear. No increased work of breathing. Cardiovascular system: S1 & S2 heard, RRR. No JVD, murmurs, gallops, clicks. Trace bilateral leg edema. Telemetry: Sinus rhythm.  Gastrointestinal system: Abdomen is nondistended, soft and nontender. Normal bowel sounds heard. Central nervous system: Alert and oriented. No focal neurological deficits. Extremities: Symmetric 5 x 5  power.   Data Reviewed: Basic Metabolic Panel:  Recent Labs Lab 06/11/15 0715 06/17/15 0200 06/17/15 0650  NA 126* 115* 116*  K 3.7 3.7 3.5  CL 92* 77* 80*  CO2 '24 22 25  '$ GLUCOSE 238*  214* 188*  BUN 6 <5* <5*  CREATININE 0.47 0.46 0.43*  CALCIUM 8.6* 9.3 8.4*  MG  --  1.0*  --    Liver Function Tests:  Recent Labs Lab 06/17/15 0200  AST 31  ALT 19  ALKPHOS 98  BILITOT 0.6  PROT 7.2  ALBUMIN 3.7    Recent Labs Lab 06/17/15 0200  LIPASE 25   No results for input(s): AMMONIA in the last 168 hours. CBC:  Recent Labs Lab 06/17/15 0200  WBC 9.6  NEUTROABS 6.7  HGB 11.8*  HCT 32.7*  MCV 82.6  PLT 345   Cardiac Enzymes: No results for input(s): CKTOTAL, CKMB, CKMBINDEX, TROPONINI in the last 168 hours. BNP (last 3 results) No results for input(s): PROBNP in the last 8760 hours. CBG:  Recent Labs Lab 06/11/15 0805 06/11/15 1239 06/11/15 1715 06/17/15 0829 06/17/15 1202  GLUCAP 245* 240* 197* 188* 252*    Recent Results (from the past 240 hour(s))  MRSA PCR Screening     Status: None   Collection Time: 06/17/15  5:30 AM  Result Value Ref Range Status   MRSA by PCR NEGATIVE NEGATIVE Final    Comment:        The GeneXpert MRSA Assay (FDA approved for NASAL specimens only), is one component of a comprehensive MRSA colonization surveillance program. It is not intended to diagnose MRSA infection nor to guide or monitor treatment for MRSA infections.   C difficile quick scan w PCR reflex     Status: None   Collection Time: 06/17/15  9:15 AM  Result Value Ref Range Status   C Diff antigen NEGATIVE NEGATIVE Final   C Diff toxin NEGATIVE NEGATIVE Final   C Diff interpretation Negative for toxigenic C. difficile  Final       Studies: No results found.      Scheduled Meds: . acetaminophen  325 mg Oral TID  . aspirin EC  81 mg Oral Daily  . [START ON 06/18/2015] azithromycin  250 mg Oral Daily  . budesonide-formoterol  2 puff Inhalation BID  . calcium-vitamin D  1 tablet Oral BID  . cholecalciferol  1,000 Units Oral Daily  . dextromethorphan-guaiFENesin  1 tablet Oral BID  . famotidine (PEPCID) IV  20 mg Intravenous Q12H  .  feeding supplement (ENSURE ENLIVE)  237 mL Oral BID BM  . gabapentin  600 mg Oral TID  . heparin  5,000 Units Subcutaneous 3 times per day  . insulin aspart  0-9 Units Subcutaneous TID WC  . insulin glargine  10 Units Subcutaneous QHS  . ipratropium-albuterol  3 mL Nebulization Q4H  . lamoTRIgine  125 mg Oral BID  . methylPREDNISolone (SOLU-MEDROL) injection  40 mg Intravenous Q12H  . niacin  750 mg Oral QHS  . nicotine  21 mg Transdermal Daily  . QUEtiapine  800 mg Oral QHS  . simvastatin  40 mg Oral q1800  . sodium chloride  3 mL Intravenous Q12H   Continuous Infusions: . sodium chloride 75 mL/hr at 06/17/15 7371    Principal Problem:   Nausea vomiting and diarrhea Active Problems:   Hypertension   COPD exacerbation (HCC)   Tobacco abuse   Hyponatremia   Chronic respiratory failure ([place])   Bipolar I  disorder ([place])   Type 2 diabetes mellitus with hyperglycemia (HCC)   Hyperlipidemia   SIADH (syndrome of inappropriate ADH production) (HCC)   Hypomagnesemia   GERD (gastroesophageal reflux disease)    Time spent: 30 minutes.    Vernell Leep, MD, FACP, FHM. [facility] Pager [phone]  If 7PM-7AM, please contact night-coverage [email or web address] Password [email or web address] 06/17/2015, 12:21 PM    LOS: 0 days

## 2015-06-17 NOTE — ED Notes (Signed)
Niu MD at bedside. 

## 2015-06-17 NOTE — Consult Note (Signed)
Renal Service Consult Note [facility]  Sarah Leonard 06/17/2015 [place] Requesting Physician:  Dr Blaine Hamper  Reason for Consult:  Hyponatremia HPI: The patient is a 54 y.o. year-old with hx of DM, HLD, COPD, chronic respiratory failure on home oxygen 3 L/m, GERD, depression/bipolar disorder, polysubstance abuse, chronic headache, chronic back pain who was hospitalized here from 10/25- 06/11/15 for hyponatremia.  She was rx'd for SIADH with hypertonic saline, lowest Na was 114 and was 127 approx at dc.    Now she presented with nausea and vomiting, diarrhea, abd pain and headache, coughing and wheezing. Na was 116 on admission and went down to 114 on 0.9% normal saline.  Asked to see for hyponatremia. Home meds are asa, neurontin, insulin, Lamictal, Glucophage, niacin, prilosec, Seroquel, Zocor, Spiriva, ambien, proventil  BP is 150/89, HR 94, afeb RR 18.  3L  w SaO2 100%  Chart review: Oct 2016 - hyponatremia/SIADH, intractable HA/ chronic, COPD/ home O2/ chronic resp failrue, DM2 on insluink, bipolar, hx cocaine use Apr 2016 - DM2 uncontrolled, lactic acidosis, HTN, COPD exac Apr 2014 - septic shock/ axillary abscess, cx's neg, rx IV abx , I&D and imrpoved Dec 2005 - diarrheal episode, psych eval Aug 2005 - suicidal ideation, hx OD in March '04 rx'd at [facility]. Divorced, depressed, sad.    Past Medical History  Past Medical History  Diagnosis Date  . Diabetes mellitus   . Pancreatitis   . Anemia   . Bipolar 1 disorder ([place])   . Alcohol abuse   . 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 COPD (chronic obstructive pulmonary disease) ([place])   . Hypotension   . Major depressive disorder ([place])   . Cocaine abuse   . Chronic back pain   . Renal disorder   . Acidosis   . Hyperkalemia   . Abscess   . Acute encephalopathy   . Septic shock [facility])    Past Surgical History  Past Surgical History  Procedure Laterality Date   . Cholecystectomy    . Appendectomy    . Tonsillectomy    . Incision and drainage abscess Right 03/01/2013    Procedure: INCISION AND DRAINAGE ABSCESS;  Surgeon: Harl Bowie, MD;  Location: [place];  Service: General;  Laterality: Right;  . Cesarean section     Family History  Family History  Problem Relation Age of Onset  . Diabetes Mother   . Cancer Mother     breast   Social History  reports that she has been smoking Cigarettes.  She has been smoking about 0.00 packs per day. She has never used smokeless tobacco. She reports that she uses illicit drugs (Cocaine). She reports that she does not drink alcohol. Allergies No Known Allergies Home medications Prior to Admission medications   Medication Sig Start Date End Date Taking? Authorizing Provider  acetaminophen (TYLENOL) 325 MG tablet Take 325 mg by mouth 3 (three) times daily. Prior to fish oil   Yes Historical Provider, MD  aspirin EC 81 MG tablet Take 81 mg by mouth daily.   Yes Historical Provider, MD  budesonide-formoterol (SYMBICORT) 80-4.5 MCG/ACT inhaler Inhale 2 puffs into the lungs 2 (two) times daily.   Yes Historical Provider, MD  calcium-vitamin D (OSCAL WITH D) 500-200 MG-UNIT tablet Take 1 tablet by mouth 2 (two) times daily.   Yes Historical Provider, MD  cholecalciferol (VITAMIN D) 1000 UNITS tablet Take 1,000 Units by mouth daily.  Yes Historical Provider, MD  docusate sodium (COLACE) 100 MG capsule Take 100 mg by mouth 2 (two) times daily.   Yes Historical Provider, MD  gabapentin (NEURONTIN) 300 MG capsule Take 600 mg by mouth 3 (three) times daily.   Yes Historical Provider, MD  insulin aspart (NOVOLOG) 100 UNIT/ML injection Inject 5 Units into the skin 3 (three) times daily with meals. 06/11/15  Yes Orson Eva, MD  insulin glargine (LANTUS) 100 UNIT/ML injection Inject 0.1 mLs (10 Units total) into the skin at bedtime. 06/11/15  Yes Orson Eva, MD  lamoTRIgine (LAMICTAL) 100 MG tablet Take 100 mg by mouth 2  (two) times daily. Take with 25 mg tablet   Yes Historical Provider, MD  lamoTRIgine (LAMICTAL) 25 MG tablet Take 25 mg by mouth 2 (two) times daily. Takes with '100mg'$  tablet   Yes Historical Provider, MD  metFORMIN (GLUCOPHAGE) 1000 MG tablet Take 1 tablet (1,000 mg total) by mouth 2 (two) times daily with a meal. 11/29/14  Yes Nishant Dhungel, MD  niacin (NIASPAN) 750 MG CR tablet Take 750 mg by mouth at bedtime.   Yes Historical Provider, MD  omeprazole (PRILOSEC) 20 MG capsule Take 20 mg by mouth daily before supper.    Yes Historical Provider, MD  QUEtiapine (SEROQUEL XR) 400 MG 24 hr tablet Take 800 mg by mouth at bedtime.   Yes Historical Provider, MD  simvastatin (ZOCOR) 40 MG tablet Take 40 mg by mouth daily at 6 PM.    Yes Historical Provider, MD  tiotropium (SPIRIVA) 18 MCG inhalation capsule Place 18 mcg into inhaler and inhale daily.   Yes Historical Provider, MD  zolpidem (AMBIEN) 10 MG tablet Take 5 mg by mouth at bedtime.    Yes Historical Provider, MD  albuterol (PROVENTIL HFA;VENTOLIN HFA) 108 (90 BASE) MCG/ACT inhaler Inhale 2 puffs into the lungs every 4 (four) hours as needed for wheezing or shortness of breath. 74/2/59   Delora Fuel, MD  guaifenesin (ROBITUSSIN) 100 MG/5ML syrup Take 200 mg by mouth every 4 (four) hours as needed for cough.    Historical Provider, MD  ipratropium-albuterol (DUONEB) 0.5-2.5 (3) MG/3ML SOLN Take 3 mLs by nebulization every 6 (six) hours as needed. 06/11/15   Orson Eva, MD  LORazepam (ATIVAN) 0.5 MG tablet Take 1 tablet (0.5 mg total) by mouth every 8 (eight) hours as needed for anxiety. 11/29/14   Nishant Dhungel, MD  promethazine (PHENERGAN) 12.5 MG tablet Take 12.5 mg by mouth every 6 (six) hours as needed for nausea or vomiting.    Historical Provider, MD   Liver Function Tests  Recent Labs Lab 06/17/15 0200  AST 31  ALT 19  ALKPHOS 98  BILITOT 0.6  PROT 7.2  ALBUMIN 3.7    Recent Labs Lab 06/17/15 0200  LIPASE 25   CBC  Recent  Labs Lab 06/17/15 0200  WBC 9.6  NEUTROABS 6.7  HGB 11.8*  HCT 32.7*  MCV 82.6  PLT 563   Basic Metabolic Panel  Recent Labs Lab 06/11/15 0715 06/17/15 0200 06/17/15 0650 06/17/15 1856  NA 126* 115* 116* 114*  K 3.7 3.7 3.5 3.8  CL 92* 77* 80* 77*  CO2 '24 22 25 24  '$ GLUCOSE 238* 214* 188* 224*  BUN 6 <5* <5* <5*  CREATININE 0.47 0.46 0.43* 0.58  CALCIUM 8.6* 9.3 8.4* 8.5*    Filed Vitals:   06/17/15 1532 06/17/15 1835 06/17/15 2055 06/17/15 2143  BP:  135/89 150/89 125/91  Pulse:  102 94 96  Temp:  98.6 F (37 C) 97.7 F (36.5 C)   TempSrc:  Oral Oral   Resp:  18 18   Height:      Weight:   109 kg (240 lb 4.8 oz)   SpO2: 96% 98% 97%    Exam Alert, looks miserable but not in distress and fully alert and oriented No rash, cyanosis or gangrene Sclera anicteric, throat clear No jvd or bruits Chest clear bilat RRR no MRG Abd obese soft ntnd no mass no ascites GU deferred MS no joint effusions Ext 1+ pitting R pretib, no other edema Neuro is alert, Ox 3, nonfocal  Urine ism 395 UNa 121 Na 114 K 3.8 CO2 24 Cl 77  BUN <5  Creat 0.58 Ca 8.5  Glu 224 UDS negative CTR abd w contrast 11/6 >> no acute disease, has "worrisome" lymphadenopathy in the mediastinum, recommend chest CT.   Assessment: 1. Hyponatremia - looks euvolemic, has inappropriately high urine osm, suspect SIADH from psych illness and/or medications and/ or nausea/ pain.  I don't think her nausea or other symptoms are due to low Na+.  She is fully alert and showing no signs of neurological issues from low Na+. Headaches are chronic. Low Na+ is relatively new for this patient. Recommend treatment with tolvaptan.  Will remove fluid restriction during Rx with this agent.  Check serum Na every 6 hours for now. D/C normal saline.  2. Headache, chronic issue 3. Bipolar disorder  4. COPD on home O2 5. Hx chronic back pain 6. Nausea/ emesis - per primary 7. DM on insulin   Plan- as above  Kelly Splinter  MD Edith Endave pager [phone]    cell [phone] 06/17/2015, 9:45 PM

## 2015-06-17 NOTE — Progress Notes (Signed)
CRITICAL VALUE ALERT  Critical value received: Na 116  Date of notification:  06/17/15  Time of notification:  3643  Critical value read back:Yes.    Nurse who received alert:  K.Greggory Safranek,RN  MD notified (1st page): Dr.  Algis Liming  Time of first page:  MD made aware during am rounds

## 2015-06-17 NOTE — ED Notes (Addendum)
SEE DOWNTIME PAPER WORK  

## 2015-06-18 DIAGNOSIS — R918 Other nonspecific abnormal finding of lung field: Secondary | ICD-10-CM

## 2015-06-18 DIAGNOSIS — A084 Viral intestinal infection, unspecified: Secondary | ICD-10-CM

## 2015-06-18 DIAGNOSIS — F319 Bipolar disorder, unspecified: Secondary | ICD-10-CM

## 2015-06-18 LAB — BASIC METABOLIC PANEL
ANION GAP: 16 — AB (ref 5–15)
ANION GAP: 13 (ref 5–15)
Anion gap: 14 (ref 5–15)
BUN: <5 mg/dL — ABNORMAL LOW (ref 6–20)
BUN: 7 mg/dL (ref 6–20)
BUN: 12 mg/dL (ref 6–20)
CALCIUM: 10 mg/dL (ref 8.9–10.3)
CHLORIDE: 89 mmol/L — AB (ref 101–111)
CO2: 21 mmol/L — AB (ref 22–32)
CO2: 21 mmol/L — ABNORMAL LOW (ref 22–32)
CO2: 27 mmol/L (ref 22–32)
CREATININE: 0.66 mg/dL (ref 0.44–1.00)
CREATININE: 0.73 mg/dL (ref 0.44–1.00)
Calcium: 9.5 mg/dL (ref 8.9–10.3)
Calcium: 10.5 mg/dL — ABNORMAL HIGH (ref 8.9–10.3)
Chloride: 88 mmol/L — ABNORMAL LOW (ref 101–111)
Chloride: 89 mmol/L — ABNORMAL LOW (ref 101–111)
Creatinine, Ser: 0.7 mg/dL (ref 0.44–1.00)
GFR calc Af Amer: >60 mL/min (ref >60)
GFR calc Af Amer: >60 mL/min (ref >60)
GFR calc non Af Amer: >60 mL/min (ref >60)
GLUCOSE: 325 mg/dL — AB (ref 65–99)
GLUCOSE: 335 mg/dL — AB (ref 65–99)
Glucose, Bld: 268 mg/dL — ABNORMAL HIGH (ref 65–99)
POTASSIUM: 4.8 mmol/L (ref 3.5–5.1)
POTASSIUM: 4.5 mmol/L (ref 3.5–5.1)
Potassium: 5.2 mmol/L — ABNORMAL HIGH (ref 3.5–5.1)
SODIUM: 129 mmol/L — AB (ref 135–145)
Sodium: 124 mmol/L — ABNORMAL LOW (ref 135–145)
Sodium: 125 mmol/L — ABNORMAL LOW (ref 135–145)

## 2015-06-18 LAB — GLUCOSE, CAPILLARY
GLUCOSE-CAPILLARY: 241 mg/dL — AB (ref 65–99)
GLUCOSE-CAPILLARY: 284 mg/dL — AB (ref 65–99)
Glucose-Capillary: 237 mg/dL — ABNORMAL HIGH (ref 65–99)
Glucose-Capillary: 313 mg/dL — ABNORMAL HIGH (ref 65–99)

## 2015-06-18 MED ORDER — FAMOTIDINE 20 MG PO TABS
20.0000 mg | ORAL_TABLET | Freq: Two times a day (BID) | ORAL | Status: DC
  Administered 2015-06-18 – 2015-06-22 (×8): 20 mg via ORAL
  Filled 2015-06-18 (×8): qty 1

## 2015-06-18 MED ORDER — IPRATROPIUM-ALBUTEROL 0.5-2.5 (3) MG/3ML IN SOLN
3.0000 mL | Freq: Three times a day (TID) | RESPIRATORY_TRACT | Status: DC
  Administered 2015-06-18: 3 mL via RESPIRATORY_TRACT
  Filled 2015-06-18: qty 3

## 2015-06-18 MED ORDER — INSULIN GLARGINE 100 UNIT/ML ~~LOC~~ SOLN
20.0000 [IU] | Freq: Every day | SUBCUTANEOUS | Status: DC
  Administered 2015-06-18 – 2015-06-21 (×4): 20 [IU] via SUBCUTANEOUS
  Filled 2015-06-18 (×5): qty 0.2

## 2015-06-18 MED ORDER — INSULIN ASPART 100 UNIT/ML ~~LOC~~ SOLN
0.0000 [IU] | Freq: Every day | SUBCUTANEOUS | Status: DC
  Administered 2015-06-18: 3 [IU] via SUBCUTANEOUS
  Administered 2015-06-19 – 2015-06-21 (×2): 2 [IU] via SUBCUTANEOUS

## 2015-06-18 MED ORDER — PREDNISONE 20 MG PO TABS
40.0000 mg | ORAL_TABLET | Freq: Every day | ORAL | Status: DC
  Administered 2015-06-18 – 2015-06-19 (×2): 40 mg via ORAL
  Filled 2015-06-18 (×2): qty 2

## 2015-06-18 MED ORDER — IPRATROPIUM-ALBUTEROL 0.5-2.5 (3) MG/3ML IN SOLN
3.0000 mL | Freq: Two times a day (BID) | RESPIRATORY_TRACT | Status: DC
  Administered 2015-06-18 – 2015-06-22 (×8): 3 mL via RESPIRATORY_TRACT
  Filled 2015-06-18 (×8): qty 3

## 2015-06-18 MED ORDER — INSULIN ASPART 100 UNIT/ML ~~LOC~~ SOLN
0.0000 [IU] | Freq: Three times a day (TID) | SUBCUTANEOUS | Status: DC
  Administered 2015-06-18 – 2015-06-19 (×2): 8 [IU] via SUBCUTANEOUS
  Administered 2015-06-19: 3 [IU] via SUBCUTANEOUS
  Administered 2015-06-19 – 2015-06-20 (×2): 5 [IU] via SUBCUTANEOUS
  Administered 2015-06-20: 2 [IU] via SUBCUTANEOUS
  Administered 2015-06-20: 8 [IU] via SUBCUTANEOUS
  Administered 2015-06-21: 5 [IU] via SUBCUTANEOUS
  Administered 2015-06-22: 8 [IU] via SUBCUTANEOUS
  Administered 2015-06-22: 2 [IU] via SUBCUTANEOUS

## 2015-06-18 MED ORDER — FUROSEMIDE 20 MG PO TABS
10.0000 mg | ORAL_TABLET | Freq: Every day | ORAL | Status: DC
  Administered 2015-06-19 – 2015-06-22 (×4): 10 mg via ORAL
  Filled 2015-06-18 (×4): qty 1

## 2015-06-18 NOTE — Progress Notes (Signed)
Initial Nutrition Assessment  DOCUMENTATION CODES:   Morbid obesity  INTERVENTION:  Discontinue Ensure.   Provide nourishment snacks.   Encourage adequate PO intake.   NUTRITION DIAGNOSIS:   Increased nutrient needs related to chronic illness as evidenced by estimated needs.  GOAL:   Patient will meet greater than or equal to 90% of their needs  MONITOR:   PO intake, Weight trends, Labs, I & O's, Skin  REASON FOR ASSESSMENT:   Malnutrition Screening Tool    ASSESSMENT:   54 year old female, ALF resident, history of chronic hyponatremia, SIADH, DM, HLD, COPD, chronic respiratory failure on home oxygen 3 L/m, GERD, depression/bipolar disorder, polysubstance abuse, chronic headache, chronic back pain hospitalized 10/25-10/31 for hyponatremia-felt to be secondary to SIADH with psych meds (Cymbalta, Lamictal and Seroquel contributing) presented to [facility] ED on 06/17/15 with complaints of nausea, nonbloody emesis, diarrhea, abdominal pain, nonproductive cough, dyspnea and wheezing. She states that her GI symptoms started 3 days back .  Pt reports having a decreased appetite which she reports having been ongoing for a "long time", however has been eating fine PTA. Pt was unable to quantify time frame. Meal completion has been 75-100%. Per Epic weight records, pt with no weight loss. Pt currently had Ensure ordered and reports she would like them discontinued. Pt however reports she would rather having nourishment snacks. RD to modify orders.   Pt with no observed significant fat or muscle mass loss.   Labs: Low sodium, CO2, and chloride.  High potassium.  Diet Order:  Diet Carb Modified Fluid consistency:: Thin; Room service appropriate?: Yes; Fluid restriction:: 1200 mL Fluid  Skin:  Reviewed, no issues  Last BM:  11/6  Height:   Ht Readings from Last 1 Encounters:  06/17/15 '5\' 3"'$  (1.6 m)    Weight:   Wt Readings from Last 1 Encounters:  06/17/15 240 lb 4.8 oz (109 kg)     Ideal Body Weight:  52.27 kg  BMI:  Body mass index is 42.58 kg/(m^2).  Estimated Nutritional Needs:   Kcal:  1850-2000  Protein:  100-115 grams  Fluid:  1.2 L/day  EDUCATION NEEDS:   No education needs identified at this time  Corrin Parker, MS, RD, LDN Pager # [phone] After hours/ weekend pager # [phone]

## 2015-06-18 NOTE — Progress Notes (Signed)
Inpatient Diabetes Program Recommendations  AACE/ADA: New Consensus Statement on Inpatient Glycemic Control (2015)  Target Ranges:  Prepandial:   less than 140 mg/dL      Peak postprandial:   less than 180 mg/dL (1-2 hours)      Critically ill patients:  140 - 180 mg/dL   Review of Glycemic Control  Diabetes history: Hx of SIADH and dm 2 Outpatient Diabetes medications: home lantus 10 units and 5 units meal coverage tidwc and metformin 1000 mg bid  Current orders for Inpatient glycemic control: lantus 10 units, sensitive correction tidwc.  Inpatient Diabetes Program Recommendations: Insulin - Basal: Please increase lantus to 20 units while on solumedrol Correction (SSI): Please consider increase to moderate correction tidwc and add the HS scale while on steroid therapy.    Thank you Rosita Kea, RN, MSN, CDE  Diabetes Inpatient Program Office: [phone] Pager: [phone] 8:00 am to 5:00 pm

## 2015-06-18 NOTE — Progress Notes (Signed)
PROGRESS NOTE    Sarah Leonard:[id number] DOB: October 13, 1960 DOA: 06/17/2015 PCP: Moreen Fowler  HPI/Brief narrative 54 year old female, ALF resident, history of chronic hyponatremia, SIADH, DM, HLD, COPD, chronic respiratory failure on home oxygen 3 L/m, GERD, depression/bipolar disorder, polysubstance abuse, chronic headache, chronic back pain hospitalized 10/25-10/31 for hyponatremia-felt to be secondary to SIADH with psych meds (Cymbalta, Lamictal and Seroquel contributing) presented to [facility] ED on 06/17/15 with complaints of nausea, nonbloody emesis, diarrhea, abdominal pain, nonproductive cough, dyspnea and wheezing. She states that her GI symptoms started 3 days back after she ate excess amounts of sour cream. Denies sickly contacts with similar symptoms. In ED, patient was found to have negative lipase, WBC 9.6, tachycardia, hyponatremia with sodium 126 on 06/11/15-->115, renal function okay. Patient is admitted to inpatient for further evaluation treatment.   Assessment/Plan:  Nausea, vomiting, diarrhea and some abdominal pain - DD: Possibly acute viral gastroenteritis. C. difficile testing negative. - Treat supportively with IV fluids, antiemetics and advance diet as tolerated. - Seems to have resolved.   Hyponatremia, subacute on chronic/SIADH. - Baseline sodium probably in the 120s. - May be related to dehydration from GI losses and SIADH. Her psych medications are also contributing. - Does not appear symptomatic from the hyponatremia. - Initially treated with gentle IV normal saline and received a dose of IV Lasix on 11/6. Have had difficulties obtaining BMP as ordered-orders being canceled without knowledge!-Requested floor nursing to explore. Finally able to repeat BMP last night which showed sodium of 114. Nephrology was consulted 11/6 PM and started patient on Samsca - With a single dose of Samsca, sodium has gone up to 124. Nephrology follow-up appreciated-discontinued  Samsca, starting low-dose Lasix from tomorrow and continue fluid restriction. - requested psychiatric consultation to review meds which may be contribute into her hyponatremia.  COPD exacerbation/chronic respiratory failure on home oxygen 3 L/m - Treated with oxygen, bronchodilator nebulizations, IV steroids - Improved. Change Solu-Medrol to oral prednisone with rapid taper.  Bipolar disorder - Continue home psych meds.  - Psychiatric consulted for medication management in the context of hyponatremia.  Tobacco abuse - Cessation counseled. Continue nicotine patch.  Poorly controlled type II DM - A1c on 10/26:10.4. - Continue Lantus/Will increase to 20 units at bedtime and change sliding scale to moderate sensitivity with bedtime scale. Hold metformin. Currently uncontrolled secondary to steroids-reducing.  HLD - Continue statins  GERD - Continue IV Pepcid  Hypomagnesemia - Replaced yesterday. Check magnesium.  Anemia - Stable  Mild hyperkalemia - Follow BMP in a few hours.  Right lower lobe mass, seen on CT abdomen - Suspicious for malignancy. Obtain dedicated CT chest.   DVT prophylaxis: subcut Heparin Code Status: Full Family Communication: None at bedside Disposition Plan: DC back to ALF when medically stable.   Consultants:  Nephrology.  Procedures:  None  Antibiotics:  None   Subjective: No nausea, vomiting, abdominal pain or diarrhea. No headache, dizziness or lightheadedness.  Objective: Filed Vitals:   06/18/15 0005 06/18/15 0510 06/18/15 0833 06/18/15 1000  BP:  129/68  103/46  Pulse: 86 92  89  Temp:  97.5 F (36.4 C)  97.9 F (36.6 C)  TempSrc:  Oral  Oral  Resp: '18 16  16  '$ Height:      Weight:      SpO2:  96% 94% 97%    Intake/Output Summary (Last 24 hours) at 06/18/15 1444 Last data filed at 06/18/15 1344  Gross per 24 hour  Intake  1320 ml  Output   4101 ml  Net  -2781 ml   Filed Weights   06/17/15 0521 06/17/15 2055    Weight: 109.362 kg (241 lb 1.6 oz) 109 kg (240 lb 4.8 oz)     Exam:  General exam: Pleasant middle-aged female, moderately built and obese, lying comfortably propped up in bed.  Respiratory system: Clear. No increased work of breathing. Cardiovascular system: S1 & S2 heard, RRR. No JVD, murmurs, gallops, clicks. Trace bilateral leg edema. Gastrointestinal system: Abdomen is nondistended, soft and nontender. Normal bowel sounds heard. Central nervous system: Alert and oriented. No focal neurological deficits. Extremities: Symmetric 5 x 5 power.   Data Reviewed: Basic Metabolic Panel:  Recent Labs Lab 06/17/15 0200 06/17/15 0650 06/17/15 1856 06/18/15 1110 06/18/15 1325  NA 115* 116* 114* 124* 125*  K 3.7 3.5 3.8 4.8 5.2*  CL 77* 80* 77* 89* 88*  CO2 '22 25 24 '$ 21* 21*  GLUCOSE 214* 188* 224* 325* 335*  BUN <5* <5* <5* <5* 7  CREATININE 0.46 0.43* 0.58 0.66 0.73  CALCIUM 9.3 8.4* 8.5* 9.5 10.0  MG 1.0*  --   --   --   --    Liver Function Tests:  Recent Labs Lab 06/17/15 0200  AST 31  ALT 19  ALKPHOS 98  BILITOT 0.6  PROT 7.2  ALBUMIN 3.7    Recent Labs Lab 06/17/15 0200  LIPASE 25   No results for input(s): AMMONIA in the last 168 hours. CBC:  Recent Labs Lab 06/17/15 0200  WBC 9.6  NEUTROABS 6.7  HGB 11.8*  HCT 32.7*  MCV 82.6  PLT 345   Cardiac Enzymes: No results for input(s): CKTOTAL, CKMB, CKMBINDEX, TROPONINI in the last 168 hours. BNP (last 3 results) No results for input(s): PROBNP in the last 8760 hours. CBG:  Recent Labs Lab 06/17/15 1202 06/17/15 1555 06/17/15 2053 06/18/15 0809 06/18/15 1136  GLUCAP 252* 288* 190* 237* 313*    Recent Results (from the past 240 hour(s))  MRSA PCR Screening     Status: None   Collection Time: 06/17/15  5:30 AM  Result Value Ref Range Status   MRSA by PCR NEGATIVE NEGATIVE Final    Comment:        The GeneXpert MRSA Assay (FDA approved for NASAL specimens only), is one component of  a comprehensive MRSA colonization surveillance program. It is not intended to diagnose MRSA infection nor to guide or monitor treatment for MRSA infections.   Culture, blood (routine x 2) Call MD if unable to obtain prior to antibiotics being given     Status: None (Preliminary result)   Collection Time: 06/17/15  6:50 AM  Result Value Ref Range Status   Specimen Description BLOOD RIGHT HAND  Final   Special Requests BOTTLES DRAWN AEROBIC ONLY 6CC  Final   Culture NO GROWTH 1 DAY  Final   Report Status PENDING  Incomplete  Culture, blood (routine x 2) Call MD if unable to obtain prior to antibiotics being given     Status: None (Preliminary result)   Collection Time: 06/17/15  7:01 AM  Result Value Ref Range Status   Specimen Description BLOOD RIGHT HAND  Final   Special Requests IN PEDIATRIC BOTTLE 3CC  Final   Culture NO GROWTH 1 DAY  Final   Report Status PENDING  Incomplete  C difficile quick scan w PCR reflex     Status: None   Collection Time: 06/17/15  9:15 AM  Result  Value Ref Range Status   C Diff antigen NEGATIVE NEGATIVE Final   C Diff toxin NEGATIVE NEGATIVE Final   C Diff interpretation Negative for toxigenic C. difficile  Final       Studies: Ct Abdomen Pelvis W Contrast  06/17/2015  CLINICAL DATA:  Abdominal pain with nausea, vomiting, and diarrhea. EXAM: CT ABDOMEN AND PELVIS WITH CONTRAST TECHNIQUE: Multidetector CT imaging of the abdomen and pelvis was performed using the standard protocol following bolus administration of intravenous contrast. CONTRAST:  183m OMNIPAQUE IOHEXOL 300 MG/ML  SOLN COMPARISON:  CT scans dated 03/10/2013 and 11/25/2014 FINDINGS: Lower chest: There is an irregular 15 mm mass in the right lower lobe posteriorly, new since 11/25/2014. There is a middle mediastinal mass just to the right of the esophagus and just posterior to the right pulmonary veins entering the left atrium. This mass measures at least 3.8 x 3.3 x 6 cm. There is coronary  artery calcification as well as mitral valve annular calcification. Hepatobiliary: There is chronic hepatomegaly and hepatic steatosis. Gallbladder has been removed. No dilated bile ducts. Pancreas: Normal. Spleen: Normal. Adrenals/Urinary Tract: The adrenal glands are normal. 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 Right kidney and bladder are normal. Stomach/Bowel: Normal. Vascular/Lymphatic: No adenopathy. Aortic and common iliac atherosclerosis. Reproductive: Normal. Other: No mass lesions.  No free air or free fluid. Musculoskeletal: No significant abnormality. IMPRESSION: 1. No acute abnormality of the abdomen. Chronic hepatomegaly and hepatic steatosis. 2. New 15 mm irregular mass in the lower lobe of the right lung. 3. Increased middle mediastinal adenopathy. The findings in the chest are worrisome for malignancy. Less likely, sarcoidosis could give this appearance. CT scan of the chest recommended for further evaluation. Electronically Signed   By: JLorriane ShireM.D.   On: 06/17/2015 13:11        Scheduled Meds: . acetaminophen  325 mg Oral TID  . aspirin EC  81 mg Oral Daily  . azithromycin  250 mg Oral Daily  . budesonide-formoterol  2 puff Inhalation BID  . calcium-vitamin D  1 tablet Oral BID  . cholecalciferol  1,000 Units Oral Daily  . dextromethorphan-guaiFENesin  1 tablet Oral BID  . famotidine (PEPCID) IV  20 mg Intravenous Q12H  . feeding supplement (ENSURE ENLIVE)  237 mL Oral BID BM  . [START ON 06/19/2015] furosemide  10 mg Oral Daily  . gabapentin  600 mg Oral TID  . heparin  5,000 Units Subcutaneous 3 times per day  . insulin aspart  0-9 Units Subcutaneous TID WC  . insulin glargine  10 Units Subcutaneous QHS  . ipratropium-albuterol  3 mL Nebulization BID  . lamoTRIgine  125 mg Oral BID  . niacin  750 mg Oral QHS  . nicotine  21 mg Transdermal Daily  . predniSONE  40 mg Oral Q breakfast  . QUEtiapine  800 mg Oral QHS  . simvastatin  40 mg Oral  q1800  . sodium chloride  3 mL Intravenous Q12H   Continuous Infusions:    Principal Problem:   Nausea vomiting and diarrhea Active Problems:   Hypertension   COPD exacerbation (HCC)   Tobacco abuse   Hyponatremia   Chronic respiratory failure (HCC)   Bipolar I disorder (HCC)   Type 2 diabetes mellitus with hyperglycemia (HCC)   Hyperlipidemia   SIADH (syndrome of inappropriate ADH production) (HCC)   Hypomagnesemia   GERD (gastroesophageal reflux disease)    Time spent: 30 minutes.    HVernell Leep MD, [place]  FHM. [facility] Pager [phone]  If 7PM-7AM, please contact night-coverage [email or web address] Password [email or web address] 06/18/2015, 2:44 PM    LOS: 1 day

## 2015-06-18 NOTE — Progress Notes (Signed)
Na 124 report text to MD. As ordered ([place])

## 2015-06-18 NOTE — Progress Notes (Signed)
Reordered B met this am as MD had ordered before and lab was called to follow up  Will call  MD with results when available

## 2015-06-18 NOTE — Consult Note (Signed)
[facility] Face-to-Face Psychiatry Consult   Reason for Consult:  Hyponatremia with evaluation of psych meds as a possible exacerbation Referring Physician:  [facility] Patient Identification: Sarah Leonard MRN:  [id number] Principal Diagnosis: Nausea vomiting and diarrhea and Bipolar 1 Diagnosis:   Patient Active Problem List   Diagnosis Date Noted  . Nausea vomiting and diarrhea [R11.2, R19.7] 06/17/2015  . Hypomagnesemia [E83.42] 06/17/2015  . GERD (gastroesophageal reflux disease) [K21.9] 06/17/2015  . Abdominal pain [R10.9]   . SIADH (syndrome of inappropriate ADH production) ([place]) [E22.2] 06/08/2015  . Type 2 diabetes mellitus with hyperglycemia ([place]) [E11.65] 06/07/2015  . Hyperlipidemia [E78.5] 06/07/2015  . Hyponatremia [E87.1] 06/06/2015  . Headache [R51] 06/06/2015  . Chronic respiratory failure ([place]) [J96.10] 06/06/2015  . Bipolar I disorder ([place]) [F31.9] 06/06/2015  . Frontal headache [R51]   . Acute respiratory failure with hypoxia ([place]) [J96.01] 11/29/2014  . Hyperglycemia due to type 2 diabetes mellitus ([place]) [E11.65] 11/29/2014  . Obesity [E66.9] 11/29/2014  . Tobacco abuse [Z72.0] 11/29/2014  . Noncompliance with medications [Z91.14] 11/29/2014  . Lactic acidosis [E87.2] 11/25/2014  . Diabetes mellitus type 2, uncontrolled ([place]) [E11.65] 11/25/2014  . Hypertension [I10] 11/25/2014  . COPD exacerbation ([place]) [J44.1]   . Axillary abscess [L02.419] 03/03/2013  . Septic shock ([place]) [A41.9, R65.21] 02/28/2013  . Acute renal failure ([place]) [N17.9] 02/28/2013  . Abscess [L02.91] 02/28/2013  . Acidosis [E87.2] 02/28/2013  . Hyperkalemia [E87.5] 02/28/2013    Total Time spent with patient: 40 minutes  Subjective:   Sarah Leonard is a 54 y.o. female patient admitted with reports of nausea, vomiting, loose stools, and abdominal pain with subsequent discovery of hyponatremia. This may be due to psychogenic polydipsia (pt denies) or Neurontin (recommending  discontinuation). Please mention polydipsia risk to social work/nursing to discuss at discharge with [facility] staff.   Pt seen and chart reviewed. Pt is alert/oriented x4, calm, cooperative, and appropriate. Pt reports intermittent confusion and difficulty completing her thoughts for the past few days. Pt reports that her symptoms mentioned above began on Saturday and worsened to where she had to go to the hospital. Pt states that she has been compliant with her psychiatric medications and that she has had the same provider for these meds at [facility] for 11 years. Pt denies suicidal/homicidal ideation and psychosis and does not appear to be responding to internal stimuli. Pt cites good appetite and good sleep for the past 2 days, yet being awake "maybe 2-3 days in a row" within the past week at [facility] with obsessive cleaning/organizing in her room there. There are no manic/hypomanic symptoms evident during this assessment today and the nursing staff report that they have not seen these behaviors.   HPI:   Sarah Leonard is a 54 y.o. female with PMH of chronic hyponatremia, SIADH, diabetes mellitus, hyperlipidemia, COPD, GERD, depression, pancreatitis, bipolar disorder, alcohol abuse, buccal abuse, cocaine abuse, chronic back pain, who presents with nausea, vomiting, diarrhea, abdominal pain, cough and wheezing.  Patient reports that she started having nausea, vomiting, diarrhea and abdominal pain since yesterday. She vomited more than 10 times without blood in the vomitus. She has had 4-5 times of bowel movement with loose stools. She saw small amount of blood when wipes. She has moderate abdominal pain over the periumbilical area,which is constant, nonradiating. Does not have fever or chills. She reports that she has cough with white mucus production, shortness of breath and wheezing. She does not have chest pain, symptoms for UTI,  unibilateral weakness. Patient reports that she has not had good  sleep in the past 3 days. He feels very sleepy.  In ED, patient was found to have negative lipase, WBC 9.6, tachycardia, hyponatremia with sodium 126 on 06/11/15-->115, renal function okay. Patient is admitted to inpatient for further evaluation treatment.   Past Psychiatric History: Long-term Bipolar 1 treatment  Risk to Self: Is patient at risk for suicide?: No Risk to Others:   Prior Inpatient Therapy:   Prior Outpatient Therapy:    Past Medical History:  Past Medical History  Diagnosis Date  . Diabetes mellitus   . Pancreatitis   . Anemia   . Bipolar 1 disorder ([place])   . Alcohol abuse   . 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 COPD (chronic obstructive pulmonary disease) ([place])   . Hypotension   . Major depressive disorder ([place])   . Cocaine abuse   . Chronic back pain   . Renal disorder   . Acidosis   . Hyperkalemia   . Abscess   . Acute encephalopathy   . Septic shock [facility])     Past Surgical History  Procedure Laterality Date  . Cholecystectomy    . Appendectomy    . Tonsillectomy    . Incision and drainage abscess Right 03/01/2013    Procedure: INCISION AND DRAINAGE ABSCESS;  Surgeon: Harl Bowie, MD;  Location: [place];  Service: General;  Laterality: Right;  . Cesarean section     Family History:  Family History  Problem Relation Age of Onset  . Diabetes Mother   . Cancer Mother     breast   Social History:  History  Alcohol Use No    Comment: hx of etoh abuse     History  Drug Use  . Yes  . Special: Cocaine    Social History   Social History  . Marital Status: Single    Spouse Name: N/A  . Number of Children: N/A  . Years of Education: N/A   Social History Main Topics  . Smoking status: Current Every Day Smoker -- 0.00 packs/day    Types: Cigarettes  . Smokeless tobacco: Never Used  . Alcohol Use: No     Comment: hx of etoh abuse  . Drug Use: Yes    Special: Cocaine  . Sexual Activity: Not Asked   Other Topics  Concern  . None   Social History Narrative   Additional Social History:                          Allergies:  No Known Allergies  Labs:  Results for orders placed or performed during the hospital encounter of 06/17/15 (from the past 48 hour(s))  CBC with Differential/Platelet     Status: Abnormal   Collection Time: 06/17/15  2:00 AM  Result Value Ref Range   WBC 9.6 4.0 - 10.5 K/uL   RBC 3.96 3.87 - 5.11 MIL/uL   Hemoglobin 11.8 (L) 12.0 - 15.0 g/dL   HCT 32.7 (L) 36.0 - 46.0 %   MCV 82.6 78.0 - 100.0 fL   MCH 29.8 26.0 - 34.0 pg   MCHC 36.1 (H) 30.0 - 36.0 g/dL   RDW 12.9 11.5 - 15.5 %   Platelets 345 150 - 400 K/uL   Neutrophils Relative % 71 %   Neutro Abs 6.7 1.7 - 7.7 K/uL   Lymphocytes Relative 24 %   Lymphs  Abs 2.3 0.7 - 4.0 K/uL   Monocytes Relative 4 %   Monocytes Absolute 0.4 0.1 - 1.0 K/uL   Eosinophils Relative 1 %   Eosinophils Absolute 0.1 0.0 - 0.7 K/uL   Basophils Relative 0 %   Basophils Absolute 0.0 0.0 - 0.1 K/uL  Comprehensive metabolic panel     Status: Abnormal   Collection Time: 06/17/15  2:00 AM  Result Value Ref Range   Sodium 115 (LL) 135 - 145 mmol/L    Comment: CRITICAL RESULT CALLED TO, READ BACK BY AND VERIFIED WITH: OLSEN,E RN 06/17/2015 0358 JORDANS    Potassium 3.7 3.5 - 5.1 mmol/L   Chloride 77 (L) 101 - 111 mmol/L   CO2 22 22 - 32 mmol/L   Glucose, Bld 214 (H) 65 - 99 mg/dL   BUN <5 (L) 6 - 20 mg/dL   Creatinine, Ser 0.46 0.44 - 1.00 mg/dL   Calcium 9.3 8.9 - 10.3 mg/dL   Total Protein 7.2 6.5 - 8.1 g/dL   Albumin 3.7 3.5 - 5.0 g/dL   AST 31 15 - 41 U/L   ALT 19 14 - 54 U/L   Alkaline Phosphatase 98 38 - 126 U/L   Total Bilirubin 0.6 0.3 - 1.2 mg/dL   GFR calc non Af Amer >60 >60 mL/min   GFR calc Af Amer >60 >60 mL/min    Comment: (NOTE) The eGFR has been calculated using the CKD EPI equation. This calculation has not been validated in all clinical situations. eGFR's persistently <60 mL/min signify possible  Chronic Kidney Disease.    Anion gap 16 (H) 5 - 15  Lipase, blood     Status: None   Collection Time: 06/17/15  2:00 AM  Result Value Ref Range   Lipase 25 11 - 51 U/L  Magnesium     Status: Abnormal   Collection Time: 06/17/15  2:00 AM  Result Value Ref Range   Magnesium 1.0 (L) 1.7 - 2.4 mg/dL  MRSA PCR Screening     Status: None   Collection Time: 06/17/15  5:30 AM  Result Value Ref Range   MRSA by PCR NEGATIVE NEGATIVE    Comment:        The GeneXpert MRSA Assay (FDA approved for NASAL specimens only), is one component of a comprehensive MRSA colonization surveillance program. It is not intended to diagnose MRSA infection nor to guide or monitor treatment for MRSA infections.   Influenza panel by PCR (type A & B, H1N1)     Status: None   Collection Time: 06/17/15  5:54 AM  Result Value Ref Range   Influenza A By PCR NEGATIVE NEGATIVE   Influenza B By PCR NEGATIVE NEGATIVE   H1N1 flu by pcr NOT DETECTED NOT DETECTED    Comment:        The Xpert Flu assay (FDA approved for nasal aspirates or washes and nasopharyngeal swab specimens), is intended as an aid in the diagnosis of influenza and should not be used as a sole basis for treatment.   Urine rapid drug screen (hosp performed)     Status: None   Collection Time: 06/17/15  6:04 AM  Result Value Ref Range   Opiates NONE DETECTED NONE DETECTED   Cocaine NONE DETECTED NONE DETECTED   Benzodiazepines NONE DETECTED NONE DETECTED   Amphetamines NONE DETECTED NONE DETECTED   Tetrahydrocannabinol NONE DETECTED NONE DETECTED   Barbiturates NONE DETECTED NONE DETECTED    Comment:  DRUG SCREEN FOR MEDICAL PURPOSES ONLY.  IF CONFIRMATION IS NEEDED FOR ANY PURPOSE, NOTIFY LAB WITHIN 5 DAYS.        LOWEST DETECTABLE LIMITS FOR URINE DRUG SCREEN Drug Class       Cutoff (ng/mL) Amphetamine      1000 Barbiturate      200 Benzodiazepine   144 Tricyclics       818 Opiates          300 Cocaine          300 THC               50   Osmolality, urine     Status: None   Collection Time: 06/17/15  6:05 AM  Result Value Ref Range   Osmolality, Ur 395 390 - 1090 mOsm/kg    Comment: Performed at [facility]  Sodium, urine, random     Status: None   Collection Time: 06/17/15  6:05 AM  Result Value Ref Range   Sodium, Ur 121 mmol/L  Strep pneumoniae urinary antigen     Status: None   Collection Time: 06/17/15  6:05 AM  Result Value Ref Range   Strep Pneumo Urinary Antigen NEGATIVE NEGATIVE    Comment:        Infection due to S. pneumoniae cannot be absolutely ruled out since the antigen present may be below the detection limit of the test.   Osmolality     Status: Abnormal   Collection Time: 06/17/15  6:50 AM  Result Value Ref Range   Osmolality 242 (L) 275 - 300 mOsm/kg    Comment: Performed at [place] metabolic panel     Status: Abnormal   Collection Time: 06/17/15  6:50 AM  Result Value Ref Range   Sodium 116 (LL) 135 - 145 mmol/L    Comment: CRITICAL RESULT CALLED TO, READ BACK BY AND VERIFIED WITH: K KELLY,RN ON 06/17/15 @ 07:46 BY K MATILAINEN    Potassium 3.5 3.5 - 5.1 mmol/L   Chloride 80 (L) 101 - 111 mmol/L   CO2 25 22 - 32 mmol/L   Glucose, Bld 188 (H) 65 - 99 mg/dL   BUN <5 (L) 6 - 20 mg/dL   Creatinine, Ser 0.43 (L) 0.44 - 1.00 mg/dL   Calcium 8.4 (L) 8.9 - 10.3 mg/dL   GFR calc non Af Amer >60 >60 mL/min   GFR calc Af Amer >60 >60 mL/min    Comment: (NOTE) The eGFR has been calculated using the CKD EPI equation. This calculation has not been validated in all clinical situations. eGFR's persistently <60 mL/min signify possible Chronic Kidney Disease.    Anion gap 11 5 - 15  Culture, blood (routine x 2) Call MD if unable to obtain prior to antibiotics being given     Status: None (Preliminary result)   Collection Time: 06/17/15  6:50 AM  Result Value Ref Range   Specimen Description BLOOD RIGHT HAND    Special Requests BOTTLES DRAWN AEROBIC  ONLY 6CC    Culture NO GROWTH 1 DAY    Report Status PENDING   Protime-INR     Status: None   Collection Time: 06/17/15  6:50 AM  Result Value Ref Range   Prothrombin Time 13.9 11.6 - 15.2 seconds   INR 1.05 0.00 - 1.49  APTT     Status: None   Collection Time: 06/17/15  6:50 AM  Result Value Ref Range   aPTT 32 24 - 37  seconds  Culture, blood (routine x 2) Call MD if unable to obtain prior to antibiotics being given     Status: None (Preliminary result)   Collection Time: 06/17/15  7:01 AM  Result Value Ref Range   Specimen Description BLOOD RIGHT HAND    Special Requests IN PEDIATRIC BOTTLE 3CC    Culture NO GROWTH 1 DAY    Report Status PENDING   Glucose, capillary     Status: Abnormal   Collection Time: 06/17/15  8:29 AM  Result Value Ref Range   Glucose-Capillary 188 (H) 65 - 99 mg/dL  C difficile quick scan w PCR reflex     Status: None   Collection Time: 06/17/15  9:15 AM  Result Value Ref Range   C Diff antigen NEGATIVE NEGATIVE   C Diff toxin NEGATIVE NEGATIVE   C Diff interpretation Negative for toxigenic C. difficile   TSH     Status: None   Collection Time: 06/17/15 10:00 AM  Result Value Ref Range   TSH 1.542 0.350 - 4.500 uIU/mL  Glucose, capillary     Status: Abnormal   Collection Time: 06/17/15 12:02 PM  Result Value Ref Range   Glucose-Capillary 252 (H) 65 - 99 mg/dL  Glucose, capillary     Status: Abnormal   Collection Time: 06/17/15  3:55 PM  Result Value Ref Range   Glucose-Capillary 288 (H) 65 - 99 mg/dL  Basic metabolic panel     Status: Abnormal   Collection Time: 06/17/15  6:56 PM  Result Value Ref Range   Sodium 114 (LL) 135 - 145 mmol/L    Comment: CRITICAL RESULT CALLED TO, READ BACK BY AND VERIFIED WITH: MURPHY,V RN 06/17/15 1946 WOOTEN,K    Potassium 3.8 3.5 - 5.1 mmol/L   Chloride 77 (L) 101 - 111 mmol/L   CO2 24 22 - 32 mmol/L   Glucose, Bld 224 (H) 65 - 99 mg/dL   BUN <5 (L) 6 - 20 mg/dL   Creatinine, Ser 0.58 0.44 - 1.00 mg/dL    Calcium 8.5 (L) 8.9 - 10.3 mg/dL   GFR calc non Af Amer >60 >60 mL/min   GFR calc Af Amer >60 >60 mL/min    Comment: (NOTE) The eGFR has been calculated using the CKD EPI equation. This calculation has not been validated in all clinical situations. eGFR's persistently <60 mL/min signify possible Chronic Kidney Disease.    Anion gap 13 5 - 15  Glucose, capillary     Status: Abnormal   Collection Time: 06/17/15  8:53 PM  Result Value Ref Range   Glucose-Capillary 190 (H) 65 - 99 mg/dL  Glucose, capillary     Status: Abnormal   Collection Time: 06/18/15  8:09 AM  Result Value Ref Range   Glucose-Capillary 237 (H) 65 - 99 mg/dL   Comment 1 Notify RN   Basic metabolic panel     Status: Abnormal   Collection Time: 06/18/15 11:10 AM  Result Value Ref Range   Sodium 124 (L) 135 - 145 mmol/L   Potassium 4.8 3.5 - 5.1 mmol/L   Chloride 89 (L) 101 - 111 mmol/L   CO2 21 (L) 22 - 32 mmol/L   Glucose, Bld 325 (H) 65 - 99 mg/dL   BUN <5 (L) 6 - 20 mg/dL   Creatinine, Ser 0.66 0.44 - 1.00 mg/dL   Calcium 9.5 8.9 - 10.3 mg/dL   GFR calc non Af Amer >60 >60 mL/min   GFR calc Af Amer >60 >60 mL/min  Comment: (NOTE) The eGFR has been calculated using the CKD EPI equation. This calculation has not been validated in all clinical situations. eGFR's persistently <60 mL/min signify possible Chronic Kidney Disease.    Anion gap 14 5 - 15  Glucose, capillary     Status: Abnormal   Collection Time: 06/18/15 11:36 AM  Result Value Ref Range   Glucose-Capillary 313 (H) 65 - 99 mg/dL   Comment 1 Notify RN     Current Facility-Administered Medications  Medication Dose Route Frequency Provider Last Rate Last Dose  . acetaminophen (TYLENOL) tablet 650 mg  650 mg Oral Q6H PRN Ivor Costa, MD       Or  . acetaminophen (TYLENOL) suppository 650 mg  650 mg Rectal Q6H PRN Ivor Costa, MD      . acetaminophen (TYLENOL) tablet 325 mg  325 mg Oral TID Ivor Costa, MD   325 mg at 06/18/15 1034  . albuterol  (PROVENTIL) (2.5 MG/3ML) 0.083% nebulizer solution 2.5 mg  2.5 mg Nebulization Q2H PRN Ivor Costa, MD      . aspirin EC tablet 81 mg  81 mg Oral Daily Ivor Costa, MD   81 mg at 06/18/15 1035  . azithromycin (ZITHROMAX) tablet 250 mg  250 mg Oral Daily Ivor Costa, MD   250 mg at 06/18/15 1035  . budesonide-formoterol (SYMBICORT) 80-4.5 MCG/ACT inhaler 2 puff  2 puff Inhalation BID Ivor Costa, MD   2 puff at 06/18/15 [phone]  . calcium-vitamin D (OSCAL WITH D) 500-200 MG-UNIT per tablet 1 tablet  1 tablet Oral BID Ivor Costa, MD   1 tablet at 06/18/15 1035  . cholecalciferol (VITAMIN D) tablet 1,000 Units  1,000 Units Oral Daily Ivor Costa, MD   1,000 Units at 06/18/15 1034  . dextromethorphan-guaiFENesin (MUCINEX DM) 30-600 MG per 12 hr tablet 1 tablet  1 tablet Oral BID Ivor Costa, MD   1 tablet at 06/18/15 1034  . famotidine (PEPCID) IVPB 20 mg premix  20 mg Intravenous Q12H Ivor Costa, MD   20 mg at 06/18/15 1047  . feeding supplement (ENSURE ENLIVE) (ENSURE ENLIVE) liquid 237 mL  237 mL Oral BID BM Ivor Costa, MD   237 mL at 06/18/15 1000  . [START ON 06/19/2015] furosemide (LASIX) tablet 10 mg  10 mg Oral Daily Corliss Parish, MD      . gabapentin (NEURONTIN) capsule 600 mg  600 mg Oral TID Ivor Costa, MD   600 mg at 06/18/15 1046  . heparin injection 5,000 Units  5,000 Units Subcutaneous 3 times per day Ivor Costa, MD   5,000 Units at 06/18/15 0600  . insulin aspart (novoLOG) injection 0-9 Units  0-9 Units Subcutaneous TID WC Ivor Costa, MD   7 Units at 06/18/15 1154  . insulin glargine (LANTUS) injection 10 Units  10 Units Subcutaneous QHS Ivor Costa, MD   10 Units at 06/17/15 2141  . ipratropium-albuterol (DUONEB) 0.5-2.5 (3) MG/3ML nebulizer solution 3 mL  3 mL Nebulization BID Modena Jansky, MD      . lamoTRIgine (LAMICTAL) tablet 125 mg  125 mg Oral BID Ivor Costa, MD   125 mg at 06/18/15 1036  . LORazepam (ATIVAN) tablet 0.5 mg  0.5 mg Oral Q8H PRN Ivor Costa, MD   0.5 mg at 06/17/15 0952  .  methylPREDNISolone sodium succinate (SOLU-MEDROL) 40 mg/mL injection 40 mg  40 mg Intravenous Q12H Modena Jansky, MD   40 mg at 06/18/15 0839  . morphine 2 MG/ML injection 2 mg  2 mg Intravenous Q4H PRN Ivor Costa, MD   2 mg at 06/17/15 2201  . niacin CR capsule 750 mg  750 mg Oral QHS Ivor Costa, MD   750 mg at 06/17/15 2142  . nicotine (NICODERM CQ - dosed in mg/24 hours) patch 21 mg  21 mg Transdermal Daily Ivor Costa, MD   21 mg at 06/18/15 0840  . ondansetron (ZOFRAN) injection 4 mg  4 mg Intravenous Q8H PRN Ivor Costa, MD   4 mg at 06/17/15 2201  . QUEtiapine (SEROQUEL XR) 24 hr tablet 800 mg  800 mg Oral QHS Ivor Costa, MD   800 mg at 06/17/15 2138  . simvastatin (ZOCOR) tablet 40 mg  40 mg Oral q1800 Ivor Costa, MD   40 mg at 06/17/15 1645  . sodium chloride 0.9 % injection 3 mL  3 mL Intravenous Q12H Ivor Costa, MD   3 mL at 06/18/15 1000  . zolpidem (AMBIEN) tablet 5 mg  5 mg Oral QHS PRN Ivor Costa, MD   5 mg at 06/17/15 2140    Musculoskeletal: Strength & Muscle Tone: within normal limits Gait & Station: normal Patient leans: N/A  Psychiatric Specialty Exam: Review of Systems  Psychiatric/Behavioral: Positive for depression. Negative for hallucinations and substance abuse. The patient is nervous/anxious.   All other systems reviewed and are negative.   Blood pressure 103/46, pulse 89, temperature 97.9 F (36.6 C), temperature source Oral, resp. rate 16, height _0  (1.6 m), weight 109 kg (240 lb 4.8 oz), SpO2 97 %.Body mass index is 42.58 kg/(m^2).  General Appearance: Casual and Fairly Groomed  Engineer, water::  Good  Speech:  Clear and Coherent and Normal Rate  Volume:  Normal  Mood:  Anxious  Affect:  Appropriate and Congruent  Thought Process:  Coherent and Goal Directed  Orientation:  Full (Time, Place, and Person)  Thought Content:  WDL  Suicidal Thoughts:  No  Homicidal Thoughts:  No  Memory:  Immediate;   Good Recent;   Good Remote;   Good  Judgement:  Fair   Insight:  Fair  Psychomotor Activity:  Normal  Concentration:  Fair  Recall:  Arial of Knowledge:Fair  Language: Fair  Akathisia:  No  Handed:    AIMS (if indicated):     Assets:  Communication Skills Desire for Improvement Resilience  ADL's:  Impaired  Cognition: WNL  Sleep:      Treatment Plan Summary: -non-psychiatric meds managed by [facility] group -Discontinue Neurontin as it is known to cause hyponatremia -[facility] (Pt's ALF) may consider low-dose Depakote ER at bedtime upon return to nursing home and this could be mentioned at discharge; may not be necessary of pt stabilizes on current regimen without Neurontin -Continue Ambien (as ordered on chart) -Continue Quetiapine XR 850m qhs (as ordered on chart)  Disposition: See above.   WBenjamine Mola FNP-BC 06/18/2015 3:26PM

## 2015-06-18 NOTE — Progress Notes (Signed)
Subjective:  Got one dose of samsca last night- sodium increased from 114 to 124 in 16 hours- had nearly 3 liters of UOP.  She tells me she wants a big mac or a whopper Objective Vital signs in last 24 hours: Filed Vitals:   06/18/15 0005 06/18/15 0510 06/18/15 0833 06/18/15 1000  BP:  129/68  103/46  Pulse: 86 92  89  Temp:  97.5 F (36.4 C)  97.9 F (36.6 C)  TempSrc:  Oral  Oral  Resp: '18 16  16  '$ Height:      Weight:      SpO2:  96% 94% 97%   Weight change: -0.362 kg (-12.8 oz)  Intake/Output Summary (Last 24 hours) at 06/18/15 1220 Last data filed at 06/18/15 1024  Gross per 24 hour  Intake   1200 ml  Output   3701 ml  Net  -2501 ml    Assessment/ Plan: Pt is a 54 y.o. yo female with multiple medical problems including chronic lung disease who was admitted on 06/17/2015 with nausea/vomiting/ abdominal pain with a sodium of 116 with a history of SIADH  Assessment/Plan: 1. Hyponatremia- sodium corrected quickly- perhaps too quickly with one dose of samsca.  Urine osm is suspicious for SIADH as is story.  Will hold samsca and continue with fluid restriction and also add low dose lasix to dilute urine- to start tomorrow 2. Anemia- not an issue 3. HTN/volume- euvolemic especially with free water diuresis- sugar is likely not helping- will add low dose lasix as noted above but wait til tomorrow 4.  Symptoms causing admit- better- she indicates her medicine regimen has been simplified  Lillyann Ahart A    Labs: Basic Metabolic Panel:  Recent Labs Lab 06/17/15 0650 06/17/15 1856 06/18/15 1110  NA 116* 114* 124*  K 3.5 3.8 4.8  CL 80* 77* 89*  CO2 25 24 21*  GLUCOSE 188* 224* 325*  BUN <5* <5* <5*  CREATININE 0.43* 0.58 0.66  CALCIUM 8.4* 8.5* 9.5   Liver Function Tests:  Recent Labs Lab 06/17/15 0200  AST 31  ALT 19  ALKPHOS 98  BILITOT 0.6  PROT 7.2  ALBUMIN 3.7    Recent Labs Lab 06/17/15 0200  LIPASE 25   No results for input(s): AMMONIA in  the last 168 hours. CBC:  Recent Labs Lab 06/17/15 0200  WBC 9.6  NEUTROABS 6.7  HGB 11.8*  HCT 32.7*  MCV 82.6  PLT 345   Cardiac Enzymes: No results for input(s): CKTOTAL, CKMB, CKMBINDEX, TROPONINI in the last 168 hours. CBG:  Recent Labs Lab 06/17/15 1202 06/17/15 1555 06/17/15 2053 06/18/15 0809 06/18/15 1136  GLUCAP 252* 288* 190* 237* 313*    Iron Studies: No results for input(s): IRON, TIBC, TRANSFERRIN, FERRITIN in the last 72 hours. Studies/Results: Ct Abdomen Pelvis W Contrast  06/17/2015  CLINICAL DATA:  Abdominal pain with nausea, vomiting, and diarrhea. EXAM: CT ABDOMEN AND PELVIS WITH CONTRAST TECHNIQUE: Multidetector CT imaging of the abdomen and pelvis was performed using the standard protocol following bolus administration of intravenous contrast. CONTRAST:  154m OMNIPAQUE IOHEXOL 300 MG/ML  SOLN COMPARISON:  CT scans dated 03/10/2013 and 11/25/2014 FINDINGS: Lower chest: There is an irregular 15 mm mass in the right lower lobe posteriorly, new since 11/25/2014. There is a middle mediastinal mass just to the right of the esophagus and just posterior to the right pulmonary veins entering the left atrium. This mass measures at least 3.8 x 3.3 x 6 cm. There is coronary  artery calcification as well as mitral valve annular calcification. Hepatobiliary: There is chronic hepatomegaly and hepatic steatosis. Gallbladder has been removed. No dilated bile ducts. Pancreas: Normal. Spleen: Normal. Adrenals/Urinary Tract: The adrenal glands are normal. 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 Right kidney and bladder are normal. Stomach/Bowel: Normal. Vascular/Lymphatic: No adenopathy. Aortic and common iliac atherosclerosis. Reproductive: Normal. Other: No mass lesions.  No free air or free fluid. Musculoskeletal: No significant abnormality. IMPRESSION: 1. No acute abnormality of the abdomen. Chronic hepatomegaly and hepatic steatosis. 2. New 15 mm irregular  mass in the lower lobe of the right lung. 3. Increased middle mediastinal adenopathy. The findings in the chest are worrisome for malignancy. Less likely, sarcoidosis could give this appearance. CT scan of the chest recommended for further evaluation. Electronically Signed   By: Lorriane Shire M.D.   On: 06/17/2015 13:11   Medications: Infusions:    Scheduled Medications: . acetaminophen  325 mg Oral TID  . aspirin EC  81 mg Oral Daily  . azithromycin  250 mg Oral Daily  . budesonide-formoterol  2 puff Inhalation BID  . calcium-vitamin D  1 tablet Oral BID  . cholecalciferol  1,000 Units Oral Daily  . dextromethorphan-guaiFENesin  1 tablet Oral BID  . famotidine (PEPCID) IV  20 mg Intravenous Q12H  . feeding supplement (ENSURE ENLIVE)  237 mL Oral BID BM  . gabapentin  600 mg Oral TID  . heparin  5,000 Units Subcutaneous 3 times per day  . insulin aspart  0-9 Units Subcutaneous TID WC  . insulin glargine  10 Units Subcutaneous QHS  . ipratropium-albuterol  3 mL Nebulization BID  . lamoTRIgine  125 mg Oral BID  . methylPREDNISolone (SOLU-MEDROL) injection  40 mg Intravenous Q12H  . niacin  750 mg Oral QHS  . nicotine  21 mg Transdermal Daily  . QUEtiapine  800 mg Oral QHS  . simvastatin  40 mg Oral q1800  . sodium chloride  3 mL Intravenous Q12H  . tolvaptan  15 mg Oral Q24H    have reviewed scheduled and prn medications.  Physical Exam: General: alert, eating lunch Heart: RRR Lungs: clear Abdomen: obese, non tender Extremities: minimal edema    06/18/2015,12:20 PM  LOS: 1 day

## 2015-06-19 ENCOUNTER — Inpatient Hospital Stay (HOSPITAL_COMMUNITY): Payer: Medicaid Other

## 2015-06-19 ENCOUNTER — Encounter (HOSPITAL_COMMUNITY): Payer: Self-pay | Admitting: Radiology

## 2015-06-19 DIAGNOSIS — R911 Solitary pulmonary nodule: Secondary | ICD-10-CM

## 2015-06-19 DIAGNOSIS — R599 Enlarged lymph nodes, unspecified: Secondary | ICD-10-CM

## 2015-06-19 DIAGNOSIS — R918 Other nonspecific abnormal finding of lung field: Secondary | ICD-10-CM

## 2015-06-19 DIAGNOSIS — R590 Localized enlarged lymph nodes: Secondary | ICD-10-CM | POA: Insufficient documentation

## 2015-06-19 DIAGNOSIS — E871 Hypo-osmolality and hyponatremia: Secondary | ICD-10-CM

## 2015-06-19 LAB — BASIC METABOLIC PANEL
ANION GAP: 12 (ref 5–15)
BUN: 10 mg/dL (ref 6–20)
CHLORIDE: 92 mmol/L — AB (ref 101–111)
CO2: 27 mmol/L (ref 22–32)
Calcium: 10.3 mg/dL (ref 8.9–10.3)
Creatinine, Ser: 0.59 mg/dL (ref 0.44–1.00)
GFR calc Af Amer: >60 mL/min (ref >60)
GFR calc non Af Amer: >60 mL/min (ref >60)
GLUCOSE: 213 mg/dL — AB (ref 65–99)
POTASSIUM: 4 mmol/L (ref 3.5–5.1)
SODIUM: 131 mmol/L — AB (ref 135–145)

## 2015-06-19 LAB — GLUCOSE, CAPILLARY
GLUCOSE-CAPILLARY: 196 mg/dL — AB (ref 65–99)
Glucose-Capillary: 293 mg/dL — ABNORMAL HIGH (ref 65–99)
Glucose-Capillary: 225 mg/dL — ABNORMAL HIGH (ref 65–99)
Glucose-Capillary: 212 mg/dL — ABNORMAL HIGH (ref 65–99)

## 2015-06-19 LAB — MAGNESIUM: MAGNESIUM: 1.9 mg/dL (ref 1.7–2.4)

## 2015-06-19 MED ORDER — IOHEXOL 300 MG/ML  SOLN
75.0000 mL | Freq: Once | INTRAMUSCULAR | Status: AC | PRN
  Administered 2015-06-19: 75 mL via INTRAVENOUS

## 2015-06-19 MED ORDER — DIPHENHYDRAMINE HCL 25 MG PO CAPS
25.0000 mg | ORAL_CAPSULE | ORAL | Status: DC | PRN
  Administered 2015-06-20 – 2015-06-21 (×2): 25 mg via ORAL
  Filled 2015-06-19 (×2): qty 1

## 2015-06-19 MED ORDER — DIPHENHYDRAMINE HCL 25 MG PO CAPS
50.0000 mg | ORAL_CAPSULE | Freq: Once | ORAL | Status: AC
  Administered 2015-06-19: 50 mg via ORAL
  Filled 2015-06-19: qty 2

## 2015-06-19 MED ORDER — PREDNISONE 20 MG PO TABS
30.0000 mg | ORAL_TABLET | Freq: Every day | ORAL | Status: DC
  Administered 2015-06-20 – 2015-06-22 (×3): 30 mg via ORAL
  Filled 2015-06-19 (×6): qty 1

## 2015-06-19 NOTE — Consult Note (Signed)
Name: Sarah Leonard MRN: [id number] DOB: [birth year]    ADMISSION DATE:  06/17/2015 CONSULTATION DATE:  11/8  REFERRING MD : [facility]  CHIEF COMPLAINT:  N/V  BRIEF PATIENT DESCRIPTION:  54 yo obese WF  SIGNIFICANT EVENTS  11/7 new rll nodule  STUDIES:  Ct as noted   HISTORY OF PRESENT ILLNESS:   54 yo WF disabled secondary to ILD/COPD with continued tobacco abuse despite being O 2 dependent. Hx of polysubstance abuse last + for Cocaine in 2014. She also suffers from bipolar disease and is on multiple medications. Just discharged 1 week ago for headache and hyponatremia (common problem for her)and returns with N/V and bloody emesis. CT abd revealed incidental new RLL 15 mm irregular nodule worrisome for cancer. PCCM asked to evaluate. Most likely she will need EBUS or possible open bx for tissue diagnosis. She is now aware of the new finding on lung nodule. Of course tobacco cessation in setting of O2 dependant ILD is reccommended.   PAST MEDICAL HISTORY :   has a past medical history of Diabetes mellitus; Pancreatitis; Anemia; Bipolar 1 disorder ([place]); Alcohol abuse; Respiratory distress; COPD (chronic obstructive pulmonary disease) ([place]); Hypotension; Major depressive disorder ([place]); Cocaine abuse; Chronic back pain; Renal disorder; Acidosis; Hyperkalemia; Abscess; Acute encephalopathy; and Septic shock ([place]).  has past surgical history that includes Cholecystectomy; Appendectomy; Tonsillectomy; Incision and drainage abscess (Right, 03/01/2013); and Cesarean section. Prior to Admission medications   Medication Sig Start Date End Date Taking? Authorizing Provider  acetaminophen (TYLENOL) 325 MG tablet Take 325 mg by mouth 3 (three) times daily. Prior to fish oil   Yes Historical Provider, MD  aspirin EC 81 MG tablet Take 81 mg by mouth daily.   Yes Historical Provider, MD  budesonide-formoterol (SYMBICORT) 80-4.5 MCG/ACT inhaler Inhale 2 puffs into the lungs 2 (two) times daily.   Yes  Historical Provider, MD  calcium-vitamin D (OSCAL WITH D) 500-200 MG-UNIT tablet Take 1 tablet by mouth 2 (two) times daily.   Yes Historical Provider, MD  cholecalciferol (VITAMIN D) 1000 UNITS tablet Take 1,000 Units by mouth daily.   Yes Historical Provider, MD  docusate sodium (COLACE) 100 MG capsule Take 100 mg by mouth 2 (two) times daily.   Yes Historical Provider, MD  gabapentin (NEURONTIN) 300 MG capsule Take 600 mg by mouth 3 (three) times daily.   Yes Historical Provider, MD  insulin aspart (NOVOLOG) 100 UNIT/ML injection Inject 5 Units into the skin 3 (three) times daily with meals. 06/11/15  Yes Orson Eva, MD  insulin glargine (LANTUS) 100 UNIT/ML injection Inject 0.1 mLs (10 Units total) into the skin at bedtime. 06/11/15  Yes Orson Eva, MD  lamoTRIgine (LAMICTAL) 100 MG tablet Take 100 mg by mouth 2 (two) times daily. Take with 25 mg tablet   Yes Historical Provider, MD  lamoTRIgine (LAMICTAL) 25 MG tablet Take 25 mg by mouth 2 (two) times daily. Takes with '100mg'$  tablet   Yes Historical Provider, MD  metFORMIN (GLUCOPHAGE) 1000 MG tablet Take 1 tablet (1,000 mg total) by mouth 2 (two) times daily with a meal. 11/29/14  Yes Nishant Dhungel, MD  niacin (NIASPAN) 750 MG CR tablet Take 750 mg by mouth at bedtime.   Yes Historical Provider, MD  omeprazole (PRILOSEC) 20 MG capsule Take 20 mg by mouth daily before supper.    Yes Historical Provider, MD  QUEtiapine (SEROQUEL XR) 400 MG 24 hr tablet Take 800 mg by mouth at bedtime.   Yes Historical Provider, MD  simvastatin (ZOCOR) 40 MG tablet Take 40 mg by mouth daily at 6 PM.    Yes Historical Provider, MD  tiotropium (SPIRIVA) 18 MCG inhalation capsule Place 18 mcg into inhaler and inhale daily.   Yes Historical Provider, MD  zolpidem (AMBIEN) 10 MG tablet Take 5 mg by mouth at bedtime.    Yes Historical Provider, MD  albuterol (PROVENTIL HFA;VENTOLIN HFA) 108 (90 BASE) MCG/ACT inhaler Inhale 2 puffs into the lungs every 4 (four) hours as  needed for wheezing or shortness of breath. 67/6/72   Delora Fuel, MD  guaifenesin (ROBITUSSIN) 100 MG/5ML syrup Take 200 mg by mouth every 4 (four) hours as needed for cough.    Historical Provider, MD  ipratropium-albuterol (DUONEB) 0.5-2.5 (3) MG/3ML SOLN Take 3 mLs by nebulization every 6 (six) hours as needed. 06/11/15   Orson Eva, MD  LORazepam (ATIVAN) 0.5 MG tablet Take 1 tablet (0.5 mg total) by mouth every 8 (eight) hours as needed for anxiety. 11/29/14   Nishant Dhungel, MD  promethazine (PHENERGAN) 12.5 MG tablet Take 12.5 mg by mouth every 6 (six) hours as needed for nausea or vomiting.    Historical Provider, MD   No Known Allergies  FAMILY HISTORY:  family history includes Cancer in her mother; Diabetes in her mother. SOCIAL HISTORY:  reports that she has been smoking Cigarettes.  She has been smoking about 0.00 packs per day. She has never used smokeless tobacco. She reports that she uses illicit drugs (Cocaine). She reports that she does not drink alcohol.  REVIEW OF SYSTEMS:   10 point review of system taken, please see HPI for positives and negatives.   SUBJECTIVE:   VITAL SIGNS: Temp:  [97.9 F (36.6 C)-98.4 F (36.9 C)] 98.4 F (36.9 C) (11/08 0427) Pulse Rate:  [89-108] 96 (11/08 0427) Resp:  [16-18] 16 (11/08 0427) BP: (103-145)/(46-75) 115/59 mmHg (11/08 0427) SpO2:  [91 %-97 %] 97 % (11/08 0427)  PHYSICAL EXAMINATION: General: Obese wf, nad at rest Neuro:  Grossly intact, mae x4, fight of ideas noted HEENT:  No JVD/LAN appreciated. Oral mucosa moist, edentulous  Cardiovascular:  HSR RRR Lungs:  Remarkably clear Abdomen:  Soft, mild tenderness Musculoskeletal: intact Skin:  Warm and dry   Recent Labs Lab 06/18/15 1325 06/18/15 2020 06/19/15 0557  NA 125* 129* 131*  K 5.2* 4.5 4.0  CL 88* 89* 92*  CO2 21* 27 27  BUN '7 12 10  '$ CREATININE 0.73 0.70 0.59  GLUCOSE 335* 268* 213*    Recent Labs Lab 06/17/15 0200  HGB 11.8*  HCT 32.7*  WBC 9.6   PLT 345   Ct Chest W Contrast  06/19/2015  CLINICAL DATA:  Evaluate mass seen on abdominal CT EXAM: CT CHEST WITH CONTRAST TECHNIQUE: Multidetector CT imaging of the chest was performed during intravenous contrast administration. CONTRAST:  33m OMNIPAQUE IOHEXOL 300 MG/ML  SOLN COMPARISON:  11/25/2014 FINDINGS: THORACIC INLET/BODY WALL: No axillary or thoracic inlet adenopathy. MEDIASTINUM: Mild cardiomegaly. No pericardial effusion. Atherosclerosis, including the coronary arteries. No acute vascular finding. Mediastinal and hilar lymphadenopathy with progressive enlargement since prior, especially of the subcarinal and right periesophageal nodes. For example, the subcarinal node now measures 3 cm short axis on axial imaging. LUNG WINDOWS: New 16 mm (measured craniocaudal) nodule in the right lower lobe with probable satellite 7 mm nodule. Much of the previously seen lung opacity has resolved, with residual patchy ground-glass density in the apical lungs favoring scar. Linear scarring present in the lingula. Moderate patchy air  trapping. UPPER ABDOMEN: No acute findings. OSSEOUS: No acute fracture.  No suspicious lytic or blastic lesions. IMPRESSION: Progressive mediastinal and hilar adenopathy with new 16 mm right lower lobe pulmonary nodule. Lymphoma or bronchogenic carcinoma is the primary diagnostic concern and histologic correlation is recommended, preferably guided by PET-CT. Electronically Signed   By: Monte Fantasia M.D.   On: 06/19/2015 00:44   Ct Abdomen Pelvis W Contrast  06/17/2015  CLINICAL DATA:  Abdominal pain with nausea, vomiting, and diarrhea. EXAM: CT ABDOMEN AND PELVIS WITH CONTRAST TECHNIQUE: Multidetector CT imaging of the abdomen and pelvis was performed using the standard protocol following bolus administration of intravenous contrast. CONTRAST:  114m OMNIPAQUE IOHEXOL 300 MG/ML  SOLN COMPARISON:  CT scans dated 03/10/2013 and 11/25/2014 FINDINGS: Lower chest: There is an irregular  15 mm mass in the right lower lobe posteriorly, new since 11/25/2014. There is a middle mediastinal mass just to the right of the esophagus and just posterior to the right pulmonary veins entering the left atrium. This mass measures at least 3.8 x 3.3 x 6 cm. There is coronary artery calcification as well as mitral valve annular calcification. Hepatobiliary: There is chronic hepatomegaly and hepatic steatosis. Gallbladder has been removed. No dilated bile ducts. Pancreas: Normal. Spleen: Normal. Adrenals/Urinary Tract: The adrenal glands are normal. 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 Right kidney and bladder are normal. Stomach/Bowel: Normal. Vascular/Lymphatic: No adenopathy. Aortic and common iliac atherosclerosis. Reproductive: Normal. Other: No mass lesions.  No free air or free fluid. Musculoskeletal: No significant abnormality. IMPRESSION: 1. No acute abnormality of the abdomen. Chronic hepatomegaly and hepatic steatosis. 2. New 15 mm irregular mass in the lower lobe of the right lung. 3. Increased middle mediastinal adenopathy. The findings in the chest are worrisome for malignancy. Less likely, sarcoidosis could give this appearance. CT scan of the chest recommended for further evaluation. Electronically Signed   By: JLorriane ShireM.D.   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 Hx of polysubstance abuse last + for Cocaine in 2014. She also suffers from bipolar disease and is on multiple medications. Just discharged 1 week ago for headache and hyponatremia (common problem for her)and returns with N/V and bloody emesis. CT abd revealed new RLL 15 mm irregular nodule worrisome for cancer. PCCM asked to evaluate. Most likely she will need EBUS or possible open bx for tissue diagnosis. She is now aware of the new finding on lung nodule. Of course tobacco cessation in setting of O2 dependant ILD is reccommended.     PLAN:  1.Evaluation per Pulmonary MD for possible EBUS vs other diagnostic modality. 2. Stop smoking 3. PFT for baseline establishment 4. Agree with steroids/BD, not sure if abx are warranted.  5. Hyponatremia per renal 6. Bipolar per  IM 7. DM per IM   [name] Roquel Burgin ACNP Maryanna Shape PCCM Pager [phone] till 3 pm If no answer page [phone] 06/19/2015, 8:55 AM

## 2015-06-19 NOTE — Progress Notes (Addendum)
PROGRESS NOTE    Sarah Leonard EAV:[id number] DOB: October 16, 1960 DOA: 06/17/2015 PCP: Moreen Fowler  HPI/Brief narrative 54 year old female, ALF resident, history of chronic hyponatremia, SIADH, DM, HLD, COPD, chronic respiratory failure on home oxygen 3 L/m, GERD, depression/bipolar disorder, polysubstance abuse, chronic headache, chronic back pain hospitalized 10/25-10/31 for hyponatremia-felt to be secondary to SIADH with psych meds (Cymbalta, Lamictal and Seroquel contributing) presented to [facility] ED on 06/17/15 with complaints of nausea, nonbloody emesis, diarrhea, abdominal pain, nonproductive cough, dyspnea and wheezing. She states that her GI symptoms started 3 days back after she ate excess amounts of sour cream. Denies sickly contacts with similar symptoms. In ED, patient was found to have negative lipase, WBC 9.6, tachycardia, hyponatremia with sodium 126 on 06/11/15-->115, renal function okay. Patient is admitted to inpatient for further evaluation treatment. Clinically improved. Renal consulted for SIADH and signed off. Pulmonology consulted for evaluation of lung mass.   Assessment/Plan:  Nausea, vomiting, diarrhea and some abdominal pain - DD: Possibly acute viral gastroenteritis. C. difficile testing negative. - Treat supportively with IV fluids, antiemetics and advance diet as tolerated. - resolved. Tolerating diet.  Hyponatremia, subacute on chronic/SIADH. - Baseline sodium probably in the 120s. - May be related to dehydration from GI losses and SIADH. Her psych medications are also contributing. - Does not appear symptomatic from the hyponatremia. - Initially treated with gentle IV normal saline and received a dose of IV Lasix on 11/6. Sodium did not improve. Nephrology was consulted 11/6 PM and started patient on Samsca - With a single dose of Samsca on 11/6 PM, sodium corrected to 131 today. Discontinued Samsca, started low-dose Lasix and continue fluid restriction.  Periodically follow BMP - Psychiatric consulted and discontinued Neurontin (although hyponatremia not a common side effect)  COPD exacerbation/chronic respiratory failure on home oxygen 3 L/m - Treated with oxygen, bronchodilator nebulizations, IV steroids - Improved. Changed Solu-Medrol to oral prednisone with rapid taper.  Bipolar disorder - Psychiatric consulted for medication management in the context of hyponatremia and discontinued Neurontin. Continue Seroquel and Lamictal. Outpatient psychiatry follow-up..  Tobacco abuse - Cessation counseled. Continue nicotine patch.  Poorly controlled type II DM - A1c on 10/26:10.4. - Continue Lantus/increased to 20 units at bedtime and changed sliding scale to moderate sensitivity with bedtime scale. Hold metformin. Currently uncontrolled secondary to steroids-reducing. Monitor.  HLD - Continue statins  GERD - Continue IV Pepcid  Hypomagnesemia - Replaced yesterday. Check magnesium.  Anemia - Stable  Mild hyperkalemia - Resolved.  Right lower lobe mass, seen on CT abdomen - Suspicious for malignancy. CT chest findings as reported below. Pulmonology consulted for further evaluation.   DVT prophylaxis: subcut Heparin Code Status: Full Family Communication: None at bedside Disposition Plan: DC back to ALF when medically stable.   Consultants:  Nephrology.  Pulmonology  Procedures:  None  Antibiotics:  None   Subjective: Denies complaints. No GI symptoms reported. No dyspnea.  Objective: Filed Vitals:   06/18/15 2042 06/19/15 0427 06/19/15 0941 06/19/15 0943  BP:  115/59 141/83   Pulse:  96 111   Temp:  98.4 F (36.9 C) 98.1 F (36.7 C)   TempSrc:  Oral Oral   Resp:  16 20   Height:      Weight:      SpO2: 93% 97% 96% 96%    Intake/Output Summary (Last 24 hours) at 06/19/15 1458 Last data filed at 06/19/15 1419  Gross per 24 hour  Intake   1320 ml  Output  2150 ml  Net   -830 ml   Filed Weights     06/17/15 0521 06/17/15 2055  Weight: 109.362 kg (241 lb 1.6 oz) 109 kg (240 lb 4.8 oz)     Exam:  General exam: Pleasant middle-aged female, moderately built and obese, sitting up comfortably in bed.  Respiratory system: Clear. No increased work of breathing. Cardiovascular system: S1 & S2 heard, RRR. No JVD, murmurs, gallops, clicks. Trace bilateral leg edema. Telemetry: Sinus rhythm  Gastrointestinal system: Abdomen is nondistended, soft and nontender. Normal bowel sounds heard. Central nervous system: Alert and oriented. No focal neurological deficits. Extremities: Symmetric 5 x 5 power.   Data Reviewed: Basic Metabolic Panel:  Recent Labs Lab 06/17/15 0200  06/17/15 1856 06/18/15 1110 06/18/15 1325 06/18/15 2020 06/19/15 0557  NA 115*  < > 114* 124* 125* 129* 131*  K 3.7  < > 3.8 4.8 5.2* 4.5 4.0  CL 77*  < > 77* 89* 88* 89* 92*  CO2 22  < > 24 21* 21* 27 27  GLUCOSE 214*  < > 224* 325* 335* 268* 213*  BUN <5*  < > <5* <5* '7 12 10  '$ CREATININE 0.46  < > 0.58 0.66 0.73 0.70 0.59  CALCIUM 9.3  < > 8.5* 9.5 10.0 10.5* 10.3  MG 1.0*  --   --   --   --   --   --   < > = values in this interval not displayed. Liver Function Tests:  Recent Labs Lab 06/17/15 0200  AST 31  ALT 19  ALKPHOS 98  BILITOT 0.6  PROT 7.2  ALBUMIN 3.7    Recent Labs Lab 06/17/15 0200  LIPASE 25   No results for input(s): AMMONIA in the last 168 hours. CBC:  Recent Labs Lab 06/17/15 0200  WBC 9.6  NEUTROABS 6.7  HGB 11.8*  HCT 32.7*  MCV 82.6  PLT 345   Cardiac Enzymes: No results for input(s): CKTOTAL, CKMB, CKMBINDEX, TROPONINI in the last 168 hours. BNP (last 3 results) No results for input(s): PROBNP in the last 8760 hours. CBG:  Recent Labs Lab 06/18/15 1136 06/18/15 1616 06/18/15 2105 06/19/15 0741 06/19/15 1144  GLUCAP 313* 241* 284* 196* 293*    Recent Results (from the past 240 hour(s))  MRSA PCR Screening     Status: None   Collection Time: 06/17/15   5:30 AM  Result Value Ref Range Status   MRSA by PCR NEGATIVE NEGATIVE Final    Comment:        The GeneXpert MRSA Assay (FDA approved for NASAL specimens only), is one component of a comprehensive MRSA colonization surveillance program. It is not intended to diagnose MRSA infection nor to guide or monitor treatment for MRSA infections.   Culture, blood (routine x 2) Call MD if unable to obtain prior to antibiotics being given     Status: None (Preliminary result)   Collection Time: 06/17/15  6:50 AM  Result Value Ref Range Status   Specimen Description BLOOD RIGHT HAND  Final   Special Requests BOTTLES DRAWN AEROBIC ONLY 6CC  Final   Culture NO GROWTH 2 DAYS  Final   Report Status PENDING  Incomplete  Culture, blood (routine x 2) Call MD if unable to obtain prior to antibiotics being given     Status: None (Preliminary result)   Collection Time: 06/17/15  7:01 AM  Result Value Ref Range Status   Specimen Description BLOOD RIGHT HAND  Final   Special  Requests IN PEDIATRIC BOTTLE 3CC  Final   Culture NO GROWTH 2 DAYS  Final   Report Status PENDING  Incomplete  C difficile quick scan w PCR reflex     Status: None   Collection Time: 06/17/15  9:15 AM  Result Value Ref Range Status   C Diff antigen NEGATIVE NEGATIVE Final   C Diff toxin NEGATIVE NEGATIVE Final   C Diff interpretation Negative for toxigenic C. difficile  Final  Stool culture     Status: None (Preliminary result)   Collection Time: 06/17/15  9:15 AM  Result Value Ref Range Status   Specimen Description STOOL  Final   Special Requests NONE  Final   Culture   Final    Culture reincubated for better growth Performed at [facility]    Report Status PENDING  Incomplete       Studies: Ct Chest W Contrast  06/19/2015  CLINICAL DATA:  Evaluate mass seen on abdominal CT EXAM: CT CHEST WITH CONTRAST TECHNIQUE: Multidetector CT imaging of the chest was performed during intravenous contrast administration.  CONTRAST:  13m OMNIPAQUE IOHEXOL 300 MG/ML  SOLN COMPARISON:  11/25/2014 FINDINGS: THORACIC INLET/BODY WALL: No axillary or thoracic inlet adenopathy. MEDIASTINUM: Mild cardiomegaly. No pericardial effusion. Atherosclerosis, including the coronary arteries. No acute vascular finding. Mediastinal and hilar lymphadenopathy with progressive enlargement since prior, especially of the subcarinal and right periesophageal nodes. For example, the subcarinal node now measures 3 cm short axis on axial imaging. LUNG WINDOWS: New 16 mm (measured craniocaudal) nodule in the right lower lobe with probable satellite 7 mm nodule. Much of the previously seen lung opacity has resolved, with residual patchy ground-glass density in the apical lungs favoring scar. Linear scarring present in the lingula. Moderate patchy air trapping. UPPER ABDOMEN: No acute findings. OSSEOUS: No acute fracture.  No suspicious lytic or blastic lesions. IMPRESSION: Progressive mediastinal and hilar adenopathy with new 16 mm right lower lobe pulmonary nodule. Lymphoma or bronchogenic carcinoma is the primary diagnostic concern and histologic correlation is recommended, preferably guided by PET-CT. Electronically Signed   By: JMonte FantasiaM.D.   On: 06/19/2015 00:44        Scheduled Meds: . acetaminophen  325 mg Oral TID  . aspirin EC  81 mg Oral Daily  . azithromycin  250 mg Oral Daily  . budesonide-formoterol  2 puff Inhalation BID  . calcium-vitamin D  1 tablet Oral BID  . cholecalciferol  1,000 Units Oral Daily  . dextromethorphan-guaiFENesin  1 tablet Oral BID  . famotidine  20 mg Oral BID  . furosemide  10 mg Oral Daily  . heparin  5,000 Units Subcutaneous 3 times per day  . insulin aspart  0-15 Units Subcutaneous TID WC  . insulin aspart  0-5 Units Subcutaneous QHS  . insulin glargine  20 Units Subcutaneous QHS  . ipratropium-albuterol  3 mL Nebulization BID  . lamoTRIgine  125 mg Oral BID  . niacin  750 mg Oral QHS  .  nicotine  21 mg Transdermal Daily  . predniSONE  40 mg Oral Q breakfast  . QUEtiapine  800 mg Oral QHS  . simvastatin  40 mg Oral q1800  . sodium chloride  3 mL Intravenous Q12H   Continuous Infusions:    Principal Problem:   Nausea vomiting and diarrhea Active Problems:   Hypertension   COPD exacerbation (HCC)   Obesity   Tobacco abuse   Hyponatremia   Chronic respiratory failure (HCC)   Bipolar I  disorder ([place])   Type 2 diabetes mellitus with hyperglycemia (HCC)   Hyperlipidemia   SIADH (syndrome of inappropriate ADH production) (HCC)   Hypomagnesemia   GERD (gastroesophageal reflux disease)   Right lower lobe lung mass 15 mm irregular new on 11/8 ct of cest    Time spent: 30 minutes.    Vernell Leep, MD, FACP, FHM. [facility] Pager [phone]  If 7PM-7AM, please contact night-coverage [email or web address] Password [email or web address] 06/19/2015, 2:58 PM    LOS: 2 days

## 2015-06-19 NOTE — Progress Notes (Signed)
Subjective:  Sodium continues to rise but at a lower rate- is 131 this AM- had lots of UOP yest mostly I think left over from the samsca.  She is upset about the lung lesion Objective Vital signs in last 24 hours: Filed Vitals:   06/18/15 2042 06/19/15 0427 06/19/15 0941 06/19/15 0943  BP:  115/59 141/83   Pulse:  96 111   Temp:  98.4 F (36.9 C) 98.1 F (36.7 C)   TempSrc:  Oral Oral   Resp:  16 20   Height:      Weight:      SpO2: 93% 97% 96% 96%   Weight change:   Intake/Output Summary (Last 24 hours) at 06/19/15 1116 Last data filed at 06/19/15 6045  Gross per 24 hour  Intake   1440 ml  Output   3250 ml  Net  -1810 ml    Assessment/ Plan: Pt is a 54 y.o. yo female with multiple medical problems including chronic lung disease who was admitted on 06/17/2015 with nausea/vomiting/ abdominal pain with a sodium of 116 with a history of SIADH  Assessment/Plan: 1. Hyponatremia- sodium corrected with samsca.  Urine osm is suspicious for SIADH as is story.  No more samsca and continue with fluid restriction and also adding low dose lasix to start today.   2. Anemia- not an issue 3. HTN/volume- euvolemic especially with free water diuresis- sugar is likely not helping- will add low dose lasix as noted above but wait til tomorrow 4.  Symptoms causing admit- better- she indicates her medicine regimen has been simplified 5. Lung lesion- pulm consult today 6.  Renal will follow Sarah Leonard at a distance- call if concerns  Granvil Djordjevic A    Labs: Basic Metabolic Panel:  Recent Labs Lab 06/18/15 1325 06/18/15 2020 06/19/15 0557  NA 125* 129* 131*  K 5.2* 4.5 4.0  CL 88* 89* 92*  CO2 21* 27 27  GLUCOSE 335* 268* 213*  BUN '7 12 10  '$ CREATININE 0.73 0.70 0.59  CALCIUM 10.0 10.5* 10.3   Liver Function Tests:  Recent Labs Lab 06/17/15 0200  AST 31  ALT 19  ALKPHOS 98  BILITOT 0.6  PROT 7.2  ALBUMIN 3.7    Recent Labs Lab 06/17/15 0200  LIPASE 25   No  results for input(s): AMMONIA in the last 168 hours. CBC:  Recent Labs Lab 06/17/15 0200  WBC 9.6  NEUTROABS 6.7  HGB 11.8*  HCT 32.7*  MCV 82.6  PLT 345   Cardiac Enzymes: No results for input(s): CKTOTAL, CKMB, CKMBINDEX, TROPONINI in the last 168 hours. CBG:  Recent Labs Lab 06/18/15 0809 06/18/15 1136 06/18/15 1616 06/18/15 2105 06/19/15 0741  GLUCAP 237* 313* 241* 284* 196*    Iron Studies: No results for input(s): IRON, TIBC, TRANSFERRIN, FERRITIN in the last 72 hours. Studies/Results: Ct Chest W Contrast  06/19/2015  CLINICAL DATA:  Evaluate mass seen on abdominal CT EXAM: CT CHEST WITH CONTRAST TECHNIQUE: Multidetector CT imaging of the chest was performed during intravenous contrast administration. CONTRAST:  58m OMNIPAQUE IOHEXOL 300 MG/ML  SOLN COMPARISON:  11/25/2014 FINDINGS: THORACIC INLET/BODY WALL: No axillary or thoracic inlet adenopathy. MEDIASTINUM: Mild cardiomegaly. No pericardial effusion. Atherosclerosis, including the coronary arteries. No acute vascular finding. Mediastinal and hilar lymphadenopathy with progressive enlargement since prior, especially of the subcarinal and right periesophageal nodes. For example, the subcarinal node now measures 3 cm short axis on axial imaging. LUNG WINDOWS: New 16 mm (measured craniocaudal) nodule in the right  lower lobe with probable satellite 7 mm nodule. Much of the previously seen lung opacity has resolved, with residual patchy ground-glass density in the apical lungs favoring scar. Linear scarring present in the lingula. Moderate patchy air trapping. UPPER ABDOMEN: No acute findings. OSSEOUS: No acute fracture.  No suspicious lytic or blastic lesions. IMPRESSION: Progressive mediastinal and hilar adenopathy with new 16 mm right lower lobe pulmonary nodule. Lymphoma or bronchogenic carcinoma is the primary diagnostic concern and histologic correlation is recommended, preferably guided by PET-CT. Electronically Signed    By: Monte Fantasia M.D.   On: 06/19/2015 00:44   Ct Abdomen Pelvis W Contrast  06/17/2015  CLINICAL DATA:  Abdominal pain with nausea, vomiting, and diarrhea. EXAM: CT ABDOMEN AND PELVIS WITH CONTRAST TECHNIQUE: Multidetector CT imaging of the abdomen and pelvis was performed using the standard protocol following bolus administration of intravenous contrast. CONTRAST:  146m OMNIPAQUE IOHEXOL 300 MG/ML  SOLN COMPARISON:  CT scans dated 03/10/2013 and 11/25/2014 FINDINGS: Lower chest: There is an irregular 15 mm mass in the right lower lobe posteriorly, new since 11/25/2014. There is a middle mediastinal mass just to the right of the esophagus and just posterior to the right pulmonary veins entering the left atrium. This mass measures at least 3.8 x 3.3 x 6 cm. There is coronary artery calcification as well as mitral valve annular calcification. Hepatobiliary: There is chronic hepatomegaly and hepatic steatosis. Gallbladder has been removed. No dilated bile ducts. Pancreas: Normal. Spleen: Normal. Adrenals/Urinary Tract: The adrenal glands are normal. 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 Right kidney and bladder are normal. Stomach/Bowel: Normal. Vascular/Lymphatic: No adenopathy. Aortic and common iliac atherosclerosis. Reproductive: Normal. Other: No mass lesions.  No free air or free fluid. Musculoskeletal: No significant abnormality. IMPRESSION: 1. No acute abnormality of the abdomen. Chronic hepatomegaly and hepatic steatosis. 2. New 15 mm irregular mass in the lower lobe of the right lung. 3. Increased middle mediastinal adenopathy. The findings in the chest are worrisome for malignancy. Less likely, sarcoidosis could give this appearance. CT scan of the chest recommended for further evaluation. Electronically Signed   By: JLorriane ShireM.D.   On: 06/17/2015 13:11   Medications: Infusions:    Scheduled Medications: . acetaminophen  325 mg Oral TID  . aspirin EC  81 mg Oral  Daily  . azithromycin  250 mg Oral Daily  . budesonide-formoterol  2 puff Inhalation BID  . calcium-vitamin D  1 tablet Oral BID  . cholecalciferol  1,000 Units Oral Daily  . dextromethorphan-guaiFENesin  1 tablet Oral BID  . famotidine  20 mg Oral BID  . furosemide  10 mg Oral Daily  . heparin  5,000 Units Subcutaneous 3 times per day  . insulin aspart  0-15 Units Subcutaneous TID WC  . insulin aspart  0-5 Units Subcutaneous QHS  . insulin glargine  20 Units Subcutaneous QHS  . ipratropium-albuterol  3 mL Nebulization BID  . lamoTRIgine  125 mg Oral BID  . niacin  750 mg Oral QHS  . nicotine  21 mg Transdermal Daily  . predniSONE  40 mg Oral Q breakfast  . QUEtiapine  800 mg Oral QHS  . simvastatin  40 mg Oral q1800  . sodium chloride  3 mL Intravenous Q12H    have reviewed scheduled and prn medications.  Physical Exam: General: alert, eating lunch Heart: RRR Lungs: clear Abdomen: obese, non tender Extremities: minimal edema    06/19/2015,11:16 AM  LOS: 2 days

## 2015-06-19 NOTE — Consult Note (Signed)
[facility] Face-to-Face Psychiatry Consult   Reason for Consult:  Hyponatremia and psych medication management Referring Physician:  Dr. Algis Liming Patient Identification: Sarah Leonard MRN:  [id number] Principal Diagnosis: Nausea vomiting and diarrhea and Bipolar 1 Diagnosis:   Patient Active Problem List   Diagnosis Date Noted  . Right lower lobe lung mass 15 mm irregular new on 11/8 ct of cest [J98.4] 06/19/2015  . Nausea vomiting and diarrhea [R11.2, R19.7] 06/17/2015  . Hypomagnesemia [E83.42] 06/17/2015  . GERD (gastroesophageal reflux disease) [K21.9] 06/17/2015  . Abdominal pain [R10.9]   . SIADH (syndrome of inappropriate ADH production) ([place]) [E22.2] 06/08/2015  . Type 2 diabetes mellitus with hyperglycemia ([place]) [E11.65] 06/07/2015  . Hyperlipidemia [E78.5] 06/07/2015  . Hyponatremia [E87.1] 06/06/2015  . Headache [R51] 06/06/2015  . Chronic respiratory failure ([place]) [J96.10] 06/06/2015  . Bipolar I disorder ([place]) [F31.9] 06/06/2015  . Frontal headache [R51]   . Acute respiratory failure with hypoxia ([place]) [J96.01] 11/29/2014  . Hyperglycemia due to type 2 diabetes mellitus ([place]) [E11.65] 11/29/2014  . Obesity [E66.9] 11/29/2014  . Tobacco abuse [Z72.0] 11/29/2014  . Noncompliance with medications [Z91.14] 11/29/2014  . Lactic acidosis [E87.2] 11/25/2014  . Diabetes mellitus type 2, uncontrolled ([place]) [E11.65] 11/25/2014  . Hypertension [I10] 11/25/2014  . COPD exacerbation ([place]) [J44.1]   . Axillary abscess [L02.419] 03/03/2013  . Septic shock ([place]) [A41.9, R65.21] 02/28/2013  . Acute renal failure ([place]) [N17.9] 02/28/2013  . Abscess [L02.91] 02/28/2013  . Acidosis [E87.2] 02/28/2013  . Hyperkalemia [E87.5] 02/28/2013    Total Time spent with patient: 40 minutes  Subjective:   Sarah Leonard is a 54 y.o. female patient admitted with reports of nausea, vomiting, loose stools, and abdominal pain with subsequent discovery of hyponatremia. Patient seen for face to face  psych consultation as requested by Dr. Algis Liming. Patient seen with her husband in her room. Patient seen walking with walker and physical therapist on hall way. Patient reported that she has been diagnosed with bipolar disorder and obsessive compulsive disorder over ten years ago and was admitted to [facility] x 4 and twice at [facility]. Her last admission to [facility] is 2005. Patient has been taking her medications from [facility] who comes to [place] care since her last admission. Patient reported intermittent confusion and believes staff has been giving the same medication twice some times. Patient has better control of her nausea, vomiting, diarrhoea and continue to have generalized weakness, and feeling tired. She has better appetite and sleep since admitted to hospital. She has mostly concern about her frequent headache during my visit. She denies suicidal/homicidal ideation and psychosis and does not appear to be responding to internal stimuli.   Past medical history: Sarah Leonard is a 54 y.o. female with PMH of chronic hyponatremia, SIADH, diabetes mellitus, hyperlipidemia, COPD, GERD, depression, pancreatitis, bipolar disorder, alcohol abuse, cocaine abuse, chronic back pain.  Past Psychiatric History: She has chronic mental illness and namely Bipolar disorder and obsessive compulsive disorder and history of polysubstance abuse. She has multiple psych admissions in the past.   Risk to Self: Is patient at risk for suicide?: No Risk to Others:   Prior Inpatient Therapy:   Prior Outpatient Therapy:    Past Medical History:  Past Medical History  Diagnosis Date  . Diabetes mellitus   . Pancreatitis   . Anemia   . Bipolar 1 disorder ([place])   . Alcohol abuse   . 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 COPD (chronic  obstructive pulmonary disease) ([place])   . Hypotension   . Major depressive disorder ([place])   . Cocaine abuse   . Chronic back pain   . Renal disorder   . Acidosis   .  Hyperkalemia   . Abscess   . Acute encephalopathy   . Septic shock [facility])     Past Surgical History  Procedure Laterality Date  . Cholecystectomy    . Appendectomy    . Tonsillectomy    . Incision and drainage abscess Right 03/01/2013    Procedure: INCISION AND DRAINAGE ABSCESS;  Surgeon: Harl Bowie, MD;  Location: [facility];  Service: General;  Laterality: Right;  . Cesarean section     Family History:  Family History  Problem Relation Age of Onset  . Diabetes Mother   . Cancer Mother     breast   Social History:  History  Alcohol Use No    Comment: hx of etoh abuse     History  Drug Use  . Yes  . Special: Cocaine    Social History   Social History  . Marital Status: Single    Spouse Name: N/A  . Number of Children: N/A  . Years of Education: N/A   Social History Main Topics  . Smoking status: Current Every Day Smoker -- 0.00 packs/day    Types: Cigarettes  . Smokeless tobacco: Never Used  . Alcohol Use: No     Comment: hx of etoh abuse  . Drug Use: Yes    Special: Cocaine  . Sexual Activity: Not Asked   Other Topics Concern  . None   Social History Narrative   Additional Social History:      Allergies:  No Known Allergies  Labs:  Results for orders placed or performed during the hospital encounter of 06/17/15 (from the past 48 hour(s))  Glucose, capillary     Status: Abnormal   Collection Time: 06/17/15 12:02 PM  Result Value Ref Range   Glucose-Capillary 252 (H) 65 - 99 mg/dL  Glucose, capillary     Status: Abnormal   Collection Time: 06/17/15  3:55 PM  Result Value Ref Range   Glucose-Capillary 288 (H) 65 - 99 mg/dL  Basic metabolic panel     Status: Abnormal   Collection Time: 06/17/15  6:56 PM  Result Value Ref Range   Sodium 114 (LL) 135 - 145 mmol/L    Comment: CRITICAL RESULT CALLED TO, READ BACK BY AND VERIFIED WITH: MURPHY,V RN 06/17/15 1946 WOOTEN,K    Potassium 3.8 3.5 - 5.1 mmol/L   Chloride 77 (L) 101 - 111 mmol/L   CO2  24 22 - 32 mmol/L   Glucose, Bld 224 (H) 65 - 99 mg/dL   BUN <5 (L) 6 - 20 mg/dL   Creatinine, Ser 0.58 0.44 - 1.00 mg/dL   Calcium 8.5 (L) 8.9 - 10.3 mg/dL   GFR calc non Af Amer >60 >60 mL/min   GFR calc Af Amer >60 >60 mL/min    Comment: (NOTE) The eGFR has been calculated using the CKD EPI equation. This calculation has not been validated in all clinical situations. eGFR's persistently <60 mL/min signify possible Chronic Kidney Disease.    Anion gap 13 5 - 15  Glucose, capillary     Status: Abnormal   Collection Time: 06/17/15  8:53 PM  Result Value Ref Range   Glucose-Capillary 190 (H) 65 - 99 mg/dL  Glucose, capillary     Status: Abnormal   Collection Time: 06/18/15  8:09 AM  Result Value Ref Range   Glucose-Capillary 237 (H) 65 - 99 mg/dL   Comment 1 Notify RN   Basic metabolic panel     Status: Abnormal   Collection Time: 06/18/15 11:10 AM  Result Value Ref Range   Sodium 124 (L) 135 - 145 mmol/L   Potassium 4.8 3.5 - 5.1 mmol/L   Chloride 89 (L) 101 - 111 mmol/L   CO2 21 (L) 22 - 32 mmol/L   Glucose, Bld 325 (H) 65 - 99 mg/dL   BUN <5 (L) 6 - 20 mg/dL   Creatinine, Ser 0.66 0.44 - 1.00 mg/dL   Calcium 9.5 8.9 - 10.3 mg/dL   GFR calc non Af Amer >60 >60 mL/min   GFR calc Af Amer >60 >60 mL/min    Comment: (NOTE) The eGFR has been calculated using the CKD EPI equation. This calculation has not been validated in all clinical situations. eGFR's persistently <60 mL/min signify possible Chronic Kidney Disease.    Anion gap 14 5 - 15  Glucose, capillary     Status: Abnormal   Collection Time: 06/18/15 11:36 AM  Result Value Ref Range   Glucose-Capillary 313 (H) 65 - 99 mg/dL   Comment 1 Notify RN   Basic metabolic panel     Status: Abnormal   Collection Time: 06/18/15  1:25 PM  Result Value Ref Range   Sodium 125 (L) 135 - 145 mmol/L   Potassium 5.2 (H) 3.5 - 5.1 mmol/L    Comment: SLIGHT HEMOLYSIS   Chloride 88 (L) 101 - 111 mmol/L   CO2 21 (L) 22 - 32 mmol/L    Glucose, Bld 335 (H) 65 - 99 mg/dL   BUN 7 6 - 20 mg/dL   Creatinine, Ser 0.73 0.44 - 1.00 mg/dL   Calcium 10.0 8.9 - 10.3 mg/dL   GFR calc non Af Amer >60 >60 mL/min   GFR calc Af Amer >60 >60 mL/min    Comment: (NOTE) The eGFR has been calculated using the CKD EPI equation. This calculation has not been validated in all clinical situations. eGFR's persistently <60 mL/min signify possible Chronic Kidney Disease.    Anion gap 16 (H) 5 - 15  Glucose, capillary     Status: Abnormal   Collection Time: 06/18/15  4:16 PM  Result Value Ref Range   Glucose-Capillary 241 (H) 65 - 99 mg/dL   Comment 1 Notify RN   Basic metabolic panel     Status: Abnormal   Collection Time: 06/18/15  8:20 PM  Result Value Ref Range   Sodium 129 (L) 135 - 145 mmol/L   Potassium 4.5 3.5 - 5.1 mmol/L   Chloride 89 (L) 101 - 111 mmol/L   CO2 27 22 - 32 mmol/L   Glucose, Bld 268 (H) 65 - 99 mg/dL   BUN 12 6 - 20 mg/dL   Creatinine, Ser 0.70 0.44 - 1.00 mg/dL   Calcium 10.5 (H) 8.9 - 10.3 mg/dL   GFR calc non Af Amer >60 >60 mL/min   GFR calc Af Amer >60 >60 mL/min    Comment: (NOTE) The eGFR has been calculated using the CKD EPI equation. This calculation has not been validated in all clinical situations. eGFR's persistently <60 mL/min signify possible Chronic Kidney Disease.    Anion gap 13 5 - 15  Glucose, capillary     Status: Abnormal   Collection Time: 06/18/15  9:05 PM  Result Value Ref Range   Glucose-Capillary 284 (H) 65 -  99 mg/dL  Basic metabolic panel     Status: Abnormal   Collection Time: 06/19/15  5:57 AM  Result Value Ref Range   Sodium 131 (L) 135 - 145 mmol/L   Potassium 4.0 3.5 - 5.1 mmol/L   Chloride 92 (L) 101 - 111 mmol/L   CO2 27 22 - 32 mmol/L   Glucose, Bld 213 (H) 65 - 99 mg/dL   BUN 10 6 - 20 mg/dL   Creatinine, Ser 0.59 0.44 - 1.00 mg/dL   Calcium 10.3 8.9 - 10.3 mg/dL   GFR calc non Af Amer >60 >60 mL/min   GFR calc Af Amer >60 >60 mL/min    Comment: (NOTE) The  eGFR has been calculated using the CKD EPI equation. This calculation has not been validated in all clinical situations. eGFR's persistently <60 mL/min signify possible Chronic Kidney Disease.    Anion gap 12 5 - 15  Glucose, capillary     Status: Abnormal   Collection Time: 06/19/15  7:41 AM  Result Value Ref Range   Glucose-Capillary 196 (H) 65 - 99 mg/dL    Current Facility-Administered Medications  Medication Dose Route Frequency Provider Last Rate Last Dose  . acetaminophen (TYLENOL) tablet 650 mg  650 mg Oral Q6H PRN Ivor Costa, MD   650 mg at 06/18/15 2141   Or  . acetaminophen (TYLENOL) suppository 650 mg  650 mg Rectal Q6H PRN Ivor Costa, MD      . acetaminophen (TYLENOL) tablet 325 mg  325 mg Oral TID Ivor Costa, MD   325 mg at 06/19/15 0847  . albuterol (PROVENTIL) (2.5 MG/3ML) 0.083% nebulizer solution 2.5 mg  2.5 mg Nebulization Q2H PRN Ivor Costa, MD      . aspirin EC tablet 81 mg  81 mg Oral Daily Ivor Costa, MD   81 mg at 06/19/15 0847  . azithromycin (ZITHROMAX) tablet 250 mg  250 mg Oral Daily Ivor Costa, MD   250 mg at 06/19/15 0847  . budesonide-formoterol (SYMBICORT) 80-4.5 MCG/ACT inhaler 2 puff  2 puff Inhalation BID Ivor Costa, MD   2 puff at 06/19/15 0941  . calcium-vitamin D (OSCAL WITH D) 500-200 MG-UNIT per tablet 1 tablet  1 tablet Oral BID Ivor Costa, MD   1 tablet at 06/19/15 0847  . cholecalciferol (VITAMIN D) tablet 1,000 Units  1,000 Units Oral Daily Ivor Costa, MD   1,000 Units at 06/19/15 [phone]  . dextromethorphan-guaiFENesin (MUCINEX DM) 30-600 MG per 12 hr tablet 1 tablet  1 tablet Oral BID Ivor Costa, MD   1 tablet at 06/19/15 0846  . famotidine (PEPCID) tablet 20 mg  20 mg Oral BID Modena Jansky, MD   20 mg at 06/19/15 0846  . furosemide (LASIX) tablet 10 mg  10 mg Oral Daily Corliss Parish, MD   10 mg at 06/19/15 0846  . heparin injection 5,000 Units  5,000 Units Subcutaneous 3 times per day Ivor Costa, MD   5,000 Units at 06/19/15 0518  . insulin  aspart (novoLOG) injection 0-15 Units  0-15 Units Subcutaneous TID WC Modena Jansky, MD   3 Units at 06/19/15 [phone]  . insulin aspart (novoLOG) injection 0-5 Units  0-5 Units Subcutaneous QHS Modena Jansky, MD   3 Units at 06/18/15 2148  . insulin glargine (LANTUS) injection 20 Units  20 Units Subcutaneous QHS Modena Jansky, MD   20 Units at 06/18/15 2147  . ipratropium-albuterol (DUONEB) 0.5-2.5 (3) MG/3ML nebulizer solution 3 mL  3 mL  Nebulization BID Modena Jansky, MD   3 mL at 06/19/15 0940  . lamoTRIgine (LAMICTAL) tablet 125 mg  125 mg Oral BID Ivor Costa, MD   125 mg at 06/19/15 0845  . LORazepam (ATIVAN) tablet 0.5 mg  0.5 mg Oral Q8H PRN Ivor Costa, MD   0.5 mg at 06/19/15 0845  . morphine 2 MG/ML injection 2 mg  2 mg Intravenous Q4H PRN Ivor Costa, MD   2 mg at 06/18/15 1634  . niacin CR capsule 750 mg  750 mg Oral QHS Ivor Costa, MD   750 mg at 06/18/15 2143  . nicotine (NICODERM CQ - dosed in mg/24 hours) patch 21 mg  21 mg Transdermal Daily Ivor Costa, MD   21 mg at 06/18/15 0840  . ondansetron (ZOFRAN) injection 4 mg  4 mg Intravenous Q8H PRN Ivor Costa, MD   4 mg at 06/17/15 2201  . predniSONE (DELTASONE) tablet 40 mg  40 mg Oral Q breakfast Modena Jansky, MD   40 mg at 06/19/15 0847  . QUEtiapine (SEROQUEL XR) 24 hr tablet 800 mg  800 mg Oral QHS Ivor Costa, MD   800 mg at 06/18/15 2144  . simvastatin (ZOCOR) tablet 40 mg  40 mg Oral q1800 Ivor Costa, MD   40 mg at 06/18/15 1647  . sodium chloride 0.9 % injection 3 mL  3 mL Intravenous Q12H Ivor Costa, MD   3 mL at 06/19/15 1200  . zolpidem (AMBIEN) tablet 5 mg  5 mg Oral QHS PRN Ivor Costa, MD   5 mg at 06/18/15 2142    Musculoskeletal: Strength & Muscle Tone: within normal limits Gait & Station: normal Patient leans: N/A  Psychiatric Specialty Exam: Review of Systems  Psychiatric/Behavioral: Positive for depression. Negative for hallucinations and substance abuse. The patient is nervous/anxious.   All other systems  reviewed and are negative.   Blood pressure 141/83, pulse 111, temperature 98.1 F (36.7 C), temperature source Oral, resp. rate 20, height $RemoveBe'5\' 3"'ZvFdMOhGw$  (1.6 m), weight 109 kg (240 lb 4.8 oz), SpO2 96 %.Body mass index is 42.58 kg/(m^2).  General Appearance: Casual and Fairly Groomed  Eye Contact::  Good  Speech:  Clear and Coherent and Normal Rate  Volume:  Normal  Mood:  Anxious  Affect:  Appropriate and Congruent  Thought Process:  Coherent and Goal Directed  Orientation:  Full (Time, Place, and Person)  Thought Content:  WDL  Suicidal Thoughts:  No  Homicidal Thoughts:  No  Memory:  Immediate;   Good Recent;   Good Remote;   Good  Judgement:  Fair  Insight:  Fair  Psychomotor Activity:  Normal  Concentration:  Fair  Recall:  [place] of Knowledge:Fair  Language: Fair  Akathisia:  No  Handed:    AIMS (if indicated):     Assets:  Communication Skills Desire for Improvement Resilience  ADL's:  Impaired  Cognition: WNL  Sleep:      Treatment Plan Summary: Patient does not meet criteria for acute psych admission She has no safety concerns  Continue psychiatric meds management as below:  Patient should not been on SSRI which are known to cause hyponatremia Discontinue Neurontin as it may contribute to hyponatremia (0.7%  Chances) - patient agree with discontinuation Continue Quetiapine XR $RemoveBefor'800mg'[id number]$  qhs for mood swings - may contribute for hyponatremia but has been taking for long time and needs to maintain irritability, agitation and severe mood swings Continue Lamotrigine 125 mg PO BID for mood swings  Disposition: Patient may return to [facility] where she has been staying over ten years when medically stable.   Jackson Fetters,JANARDHAHA R. 06/19/2015 1:06PM

## 2015-06-20 DIAGNOSIS — R112 Nausea with vomiting, unspecified: Secondary | ICD-10-CM

## 2015-06-20 DIAGNOSIS — R197 Diarrhea, unspecified: Secondary | ICD-10-CM

## 2015-06-20 LAB — BASIC METABOLIC PANEL
ANION GAP: 11 (ref 5–15)
BUN: 12 mg/dL (ref 6–20)
CALCIUM: 10.1 mg/dL (ref 8.9–10.3)
CO2: 28 mmol/L (ref 22–32)
Chloride: 93 mmol/L — ABNORMAL LOW (ref 101–111)
Creatinine, Ser: 0.66 mg/dL (ref 0.44–1.00)
Glucose, Bld: 173 mg/dL — ABNORMAL HIGH (ref 65–99)
POTASSIUM: 3.7 mmol/L (ref 3.5–5.1)
Sodium: 132 mmol/L — ABNORMAL LOW (ref 135–145)

## 2015-06-20 LAB — GLUCOSE, CAPILLARY
Glucose-Capillary: 129 mg/dL — ABNORMAL HIGH (ref 65–99)
Glucose-Capillary: 289 mg/dL — ABNORMAL HIGH (ref 65–99)
Glucose-Capillary: 212 mg/dL — ABNORMAL HIGH (ref 65–99)
Glucose-Capillary: 170 mg/dL — ABNORMAL HIGH (ref 65–99)

## 2015-06-20 NOTE — Progress Notes (Signed)
PROGRESS NOTE    Sarah Leonard MLY:[id number] DOB: Jan 03, 1961 DOA: 06/17/2015 PCP: Moreen Fowler  HPI/Brief narrative 54 year old female, ALF resident, history of chronic hyponatremia, SIADH, DM, HLD, COPD, chronic respiratory failure on home oxygen 3 L/m, GERD, depression/bipolar disorder, polysubstance abuse, chronic headache, chronic back pain hospitalized 10/25-10/31 for hyponatremia-felt to be secondary to SIADH with psych meds (Cymbalta, Lamictal and Seroquel contributing) presented to [facility] ED on 06/17/15 with complaints of nausea, nonbloody emesis, diarrhea, abdominal pain, nonproductive cough, dyspnea and wheezing. She states that her GI symptoms started 3 days back after she ate excess amounts of sour cream. Denies sickly contacts with similar symptoms. In ED, patient was found to have negative lipase, WBC 9.6, tachycardia, hyponatremia with sodium 126 on 06/11/15-->115, renal function okay. Patient is admitted to inpatient for further evaluation treatment. Clinically improved. Renal consulted for SIADH and signed off. Pulmonology consulted for evaluation of lung mass.   Assessment/Plan:  Nausea, vomiting, diarrhea and some abdominal pain - DD: Possibly acute viral gastroenteritis. C. difficile testing negative. - Cotninue to treat supportively with IV fluids, antiemetics and advance diet as tolerated. - resolved. Tolerating diet.  Hyponatremia, subacute on chronic/SIADH. - Baseline sodium probably in the 120s. - May be related to dehydration from GI losses and SIADH. Her psych medications are also contributing. - Does not appear symptomatic from the hyponatremia. - Initially treated with gentle IV normal saline and received a dose of IV Lasix on 11/6. Sodium did not improve. Nephrology was consulted 11/6 PM and started patient on Samsca - With a single dose of Samsca on 11/6 PM, sodium corrected to 131 today. Discontinued Samsca, started low-dose Lasix and continue fluid  restriction. Periodically follow BMP - Psychiatric consulted and discontinued Neurontin (although hyponatremia not a common side effect)  COPD exacerbation/chronic respiratory failure on home oxygen 3 L/m - Treated with oxygen, bronchodilator nebulizations, IV steroids - Improved. Currently on prednisone  Bipolar disorder - Psychiatric consulted for medication management in the context of hyponatremia and discontinued Neurontin. Continue Seroquel and Lamictal. Outpatient psychiatry follow-up..  Tobacco abuse - Cessation counseled. Continue nicotine patch.  Poorly controlled type II DM - A1c on 10/26:10.4. - Continue Lantus/increased to 20 units at bedtime and changed sliding scale to moderate sensitivity with bedtime scale. Hold metformin. Currently uncontrolled secondary to steroids-reducing. Monitor.  HLD - Continue statins  GERD - Continue IV Pepcid  Hypomagnesemia - resolved  Anemia - Stable  Mild hyperkalemia - Resolved.  Right lower lobe mass, seen on CT abdomen - Suspicious for malignancy. CT chest findings as reported below. Pulmonology consulted for further evaluation.   DVT prophylaxis: subcut Heparin Code Status: Full Family Communication: None at bedside Disposition Plan: awaiting work up results and recs from [place]:  Nephrology.  Pulmonology  Procedures:  None  Antibiotics:  None   Subjective: No new complaints.  Objective: Filed Vitals:   06/20/15 0438 06/20/15 0720 06/20/15 0927 06/20/15 1023  BP: 134/84 141/77 149/80   Pulse: 91 90 103   Temp: 97.7 F (36.5 C)  97.8 F (36.6 C)   TempSrc: Oral  Oral   Resp: 20  18   Height:      Weight:      SpO2: 96%  94% 91%    Intake/Output Summary (Last 24 hours) at 06/20/15 1554 Last data filed at 06/20/15 1300  Gross per 24 hour  Intake   1200 ml  Output    400 ml  Net    800  ml   Filed Weights   06/17/15 0521 06/17/15 2055  Weight: 109.362 kg (241 lb 1.6 oz) 109 kg  (240 lb 4.8 oz)     Exam:  General exam: Pleasant middle-aged female, moderately built and obese, sitting up comfortably in bed.  Respiratory system: Clear. No increased work of breathing, no wheezes Cardiovascular system: S1 & S2 heard, RRR. No JVD, murmurs,  Gastrointestinal system: Abdomen is nondistended, soft and nontender. Normal bowel sounds heard. Central nervous system: Alert and oriented. No focal neurological deficits. Extremities: Symmetric 5 x 5 power.   Data Reviewed: Basic Metabolic Panel:  Recent Labs Lab 06/17/15 0200  06/18/15 1110 06/18/15 1325 06/18/15 2020 06/19/15 0557 06/20/15 0340  NA 115*  < > 124* 125* 129* 131* 132*  K 3.7  < > 4.8 5.2* 4.5 4.0 3.7  CL 77*  < > 89* 88* 89* 92* 93*  CO2 22  < > 21* 21* '27 27 28  '$ GLUCOSE 214*  < > 325* 335* 268* 213* 173*  BUN <5*  < > <5* '7 12 10 12  '$ CREATININE 0.46  < > 0.66 0.73 0.70 0.59 0.66  CALCIUM 9.3  < > 9.5 10.0 10.5* 10.3 10.1  MG 1.0*  --   --   --   --  1.9  --   < > = values in this interval not displayed. Liver Function Tests:  Recent Labs Lab 06/17/15 0200  AST 31  ALT 19  ALKPHOS 98  BILITOT 0.6  PROT 7.2  ALBUMIN 3.7    Recent Labs Lab 06/17/15 0200  LIPASE 25   No results for input(s): AMMONIA in the last 168 hours. CBC:  Recent Labs Lab 06/17/15 0200  WBC 9.6  NEUTROABS 6.7  HGB 11.8*  HCT 32.7*  MCV 82.6  PLT 345   Cardiac Enzymes: No results for input(s): CKTOTAL, CKMB, CKMBINDEX, TROPONINI in the last 168 hours. BNP (last 3 results) No results for input(s): PROBNP in the last 8760 hours. CBG:  Recent Labs Lab 06/19/15 1144 06/19/15 1659 06/19/15 2106 06/20/15 0733 06/20/15 1118  GLUCAP 293* 225* 212* 129* 289*    Recent Results (from the past 240 hour(s))  MRSA PCR Screening     Status: None   Collection Time: 06/17/15  5:30 AM  Result Value Ref Range Status   MRSA by PCR NEGATIVE NEGATIVE Final    Comment:        The GeneXpert MRSA Assay  (FDA approved for NASAL specimens only), is one component of a comprehensive MRSA colonization surveillance program. It is not intended to diagnose MRSA infection nor to guide or monitor treatment for MRSA infections.   Culture, blood (routine x 2) Call MD if unable to obtain prior to antibiotics being given     Status: None (Preliminary result)   Collection Time: 06/17/15  6:50 AM  Result Value Ref Range Status   Specimen Description BLOOD RIGHT HAND  Final   Special Requests BOTTLES DRAWN AEROBIC ONLY 6CC  Final   Culture NO GROWTH 3 DAYS  Final   Report Status PENDING  Incomplete  Culture, blood (routine x 2) Call MD if unable to obtain prior to antibiotics being given     Status: None (Preliminary result)   Collection Time: 06/17/15  7:01 AM  Result Value Ref Range Status   Specimen Description BLOOD RIGHT HAND  Final   Special Requests IN PEDIATRIC BOTTLE 3CC  Final   Culture NO GROWTH 3 DAYS  Final  Report Status PENDING  Incomplete  C difficile quick scan w PCR reflex     Status: None   Collection Time: 06/17/15  9:15 AM  Result Value Ref Range Status   C Diff antigen NEGATIVE NEGATIVE Final   C Diff toxin NEGATIVE NEGATIVE Final   C Diff interpretation Negative for toxigenic C. difficile  Final  Stool culture     Status: None (Preliminary result)   Collection Time: 06/17/15  9:15 AM  Result Value Ref Range Status   Specimen Description STOOL  Final   Special Requests NONE  Final   Culture   Final    NO SUSPICIOUS COLONIES, CONTINUING TO HOLD Performed at [facility]    Report Status PENDING  Incomplete       Studies: Ct Chest W Contrast  06/19/2015  CLINICAL DATA:  Evaluate mass seen on abdominal CT EXAM: CT CHEST WITH CONTRAST TECHNIQUE: Multidetector CT imaging of the chest was performed during intravenous contrast administration. CONTRAST:  35m OMNIPAQUE IOHEXOL 300 MG/ML  SOLN COMPARISON:  11/25/2014 FINDINGS: THORACIC INLET/BODY WALL: No axillary  or thoracic inlet adenopathy. MEDIASTINUM: Mild cardiomegaly. No pericardial effusion. Atherosclerosis, including the coronary arteries. No acute vascular finding. Mediastinal and hilar lymphadenopathy with progressive enlargement since prior, especially of the subcarinal and right periesophageal nodes. For example, the subcarinal node now measures 3 cm short axis on axial imaging. LUNG WINDOWS: New 16 mm (measured craniocaudal) nodule in the right lower lobe with probable satellite 7 mm nodule. Much of the previously seen lung opacity has resolved, with residual patchy ground-glass density in the apical lungs favoring scar. Linear scarring present in the lingula. Moderate patchy air trapping. UPPER ABDOMEN: No acute findings. OSSEOUS: No acute fracture.  No suspicious lytic or blastic lesions. IMPRESSION: Progressive mediastinal and hilar adenopathy with new 16 mm right lower lobe pulmonary nodule. Lymphoma or bronchogenic carcinoma is the primary diagnostic concern and histologic correlation is recommended, preferably guided by PET-CT. Electronically Signed   By: JMonte FantasiaM.D.   On: 06/19/2015 00:44        Scheduled Meds: . acetaminophen  325 mg Oral TID  . aspirin EC  81 mg Oral Daily  . azithromycin  250 mg Oral Daily  . budesonide-formoterol  2 puff Inhalation BID  . calcium-vitamin D  1 tablet Oral BID  . cholecalciferol  1,000 Units Oral Daily  . dextromethorphan-guaiFENesin  1 tablet Oral BID  . famotidine  20 mg Oral BID  . furosemide  10 mg Oral Daily  . heparin  5,000 Units Subcutaneous 3 times per day  . insulin aspart  0-15 Units Subcutaneous TID WC  . insulin aspart  0-5 Units Subcutaneous QHS  . insulin glargine  20 Units Subcutaneous QHS  . ipratropium-albuterol  3 mL Nebulization BID  . lamoTRIgine  125 mg Oral BID  . niacin  750 mg Oral QHS  . nicotine  21 mg Transdermal Daily  . predniSONE  30 mg Oral Q breakfast  . QUEtiapine  800 mg Oral QHS  . simvastatin  40  mg Oral q1800  . sodium chloride  3 mL Intravenous Q12H   Continuous Infusions:    Principal Problem:   Nausea vomiting and diarrhea Active Problems:   Hypertension   COPD exacerbation (HCC)   Obesity   Tobacco abuse   Hyponatremia   Chronic respiratory failure (HCC)   Bipolar I disorder (HCC)   Type 2 diabetes mellitus with hyperglycemia (HCC)   Hyperlipidemia   SIADH (  syndrome of inappropriate ADH production) (HCC)   Hypomagnesemia   GERD (gastroesophageal reflux disease)   Right lower lobe lung mass 15 mm irregular new on 11/8 ct of cest   Lung mass   Lung nodule, solitary   Mediastinal lymphadenopathy    Time spent: 30 minutes.    Velvet Bathe, MD, FACP, [facility]. [facility] Pager [phone]  If 7PM-7AM, please contact night-coverage [email or web address] Password [email or web address] 06/20/2015, 3:54 PM    LOS: 3 days

## 2015-06-20 NOTE — Progress Notes (Signed)
Patient planned for EBUS at 1:30 pm on 11/10. Patient & patient's nurse notified as well as order for consent & NPO for 6 hours prior to procedure.  Sonia Baller Ashok Cordia, M.D. [facility] Pager:  [phone] After 3pm or if no response, call [phone]

## 2015-06-21 ENCOUNTER — Inpatient Hospital Stay (HOSPITAL_COMMUNITY): Payer: Medicaid Other

## 2015-06-21 ENCOUNTER — Encounter (HOSPITAL_COMMUNITY): Payer: Self-pay | Admitting: Anesthesiology

## 2015-06-21 ENCOUNTER — Inpatient Hospital Stay (HOSPITAL_COMMUNITY): Payer: Medicaid Other | Admitting: Anesthesiology

## 2015-06-21 ENCOUNTER — Encounter (HOSPITAL_COMMUNITY)
Admission: EM | Disposition: A | Discharge status: Nursing Home (Includes ICF) | Payer: Self-pay | Source: Home / Self Care | Attending: Family Medicine

## 2015-06-21 HISTORY — PX: FLEXIBLE BRONCHOSCOPY: SHX5094

## 2015-06-21 HISTORY — PX: VIDEO BRONCHOSCOPY WITH ENDOBRONCHIAL ULTRASOUND: SHX6177

## 2015-06-21 LAB — GLUCOSE, CAPILLARY
GLUCOSE-CAPILLARY: 233 mg/dL — AB (ref 65–99)
GLUCOSE-CAPILLARY: 202 mg/dL — AB (ref 65–99)
Glucose-Capillary: 157 mg/dL — ABNORMAL HIGH (ref 65–99)
Glucose-Capillary: 221 mg/dL — ABNORMAL HIGH (ref 65–99)
Glucose-Capillary: 245 mg/dL — ABNORMAL HIGH (ref 65–99)
Glucose-Capillary: 254 mg/dL — ABNORMAL HIGH (ref 65–99)
Glucose-Capillary: 206 mg/dL — ABNORMAL HIGH (ref 65–99)

## 2015-06-21 SURGERY — BRONCHOSCOPY, WITH EBUS
Anesthesia: General

## 2015-06-21 MED ORDER — NEOSTIGMINE METHYLSULFATE 10 MG/10ML IV SOLN
INTRAVENOUS | Status: DC | PRN
  Administered 2015-06-21: 5 mg via INTRAVENOUS

## 2015-06-21 MED ORDER — NEOSTIGMINE METHYLSULFATE 10 MG/10ML IV SOLN
INTRAVENOUS | Status: AC
  Filled 2015-06-21: qty 1

## 2015-06-21 MED ORDER — HYDROMORPHONE HCL 1 MG/ML IJ SOLN: 0.2500 mg | INTRAMUSCULAR | Status: DC | PRN

## 2015-06-21 MED ORDER — FENTANYL CITRATE (PF) 250 MCG/5ML IJ SOLN
INTRAMUSCULAR | Status: AC
  Filled 2015-06-21: qty 5

## 2015-06-21 MED ORDER — LIDOCAINE HCL 1 % IJ SOLN
INTRAMUSCULAR | Status: DC | PRN
  Administered 2015-06-21: 10 mL

## 2015-06-21 MED ORDER — PROPOFOL 10 MG/ML IV BOLUS
INTRAVENOUS | Status: DC | PRN
  Administered 2015-06-21: 200 mg via INTRAVENOUS

## 2015-06-21 MED ORDER — FENTANYL CITRATE (PF) 100 MCG/2ML IJ SOLN
INTRAMUSCULAR | Status: DC | PRN
  Administered 2015-06-21: 100 ug via INTRAVENOUS

## 2015-06-21 MED ORDER — ONDANSETRON HCL 4 MG/2ML IJ SOLN
INTRAMUSCULAR | Status: DC | PRN
  Administered 2015-06-21: 4 mg via INTRAVENOUS

## 2015-06-21 MED ORDER — ESMOLOL HCL 100 MG/10ML IV SOLN
INTRAVENOUS | Status: DC | PRN
  Administered 2015-06-21: 10 mg via INTRAVENOUS
  Administered 2015-06-21 (×3): 20 mg via INTRAVENOUS
  Administered 2015-06-21: 10 mg via INTRAVENOUS
  Administered 2015-06-21: 20 mg via INTRAVENOUS

## 2015-06-21 MED ORDER — LIDOCAINE HCL (PF) 1 % IJ SOLN
INTRAMUSCULAR | Status: AC
  Filled 2015-06-21: qty 30

## 2015-06-21 MED ORDER — LIDOCAINE HCL (CARDIAC) 20 MG/ML IV SOLN
INTRAVENOUS | Status: AC
  Filled 2015-06-21: qty 5

## 2015-06-21 MED ORDER — MIDAZOLAM HCL 5 MG/5ML IJ SOLN
INTRAMUSCULAR | Status: DC | PRN
  Administered 2015-06-21 (×2): 1 mg via INTRAVENOUS

## 2015-06-21 MED ORDER — GLYCOPYRROLATE 0.2 MG/ML IJ SOLN
INTRAMUSCULAR | Status: DC | PRN
  Administered 2015-06-21: 0.6 mg via INTRAVENOUS

## 2015-06-21 MED ORDER — 0.9 % SODIUM CHLORIDE (POUR BTL) OPTIME
TOPICAL | Status: DC | PRN
  Administered 2015-06-21: 1000 mL

## 2015-06-21 MED ORDER — PROPOFOL 10 MG/ML IV BOLUS
INTRAVENOUS | Status: AC
  Filled 2015-06-21: qty 20

## 2015-06-21 MED ORDER — PROMETHAZINE HCL 25 MG/ML IJ SOLN: 6.2500 mg | INTRAMUSCULAR | Status: DC | PRN

## 2015-06-21 MED ORDER — PHENYLEPHRINE 40 MCG/ML (10ML) SYRINGE FOR IV PUSH (FOR BLOOD PRESSURE SUPPORT)
PREFILLED_SYRINGE | INTRAVENOUS | Status: AC
  Filled 2015-06-21: qty 10

## 2015-06-21 MED ORDER — ROCURONIUM BROMIDE 50 MG/5ML IV SOLN
INTRAVENOUS | Status: AC
  Filled 2015-06-21: qty 1

## 2015-06-21 MED ORDER — ESMOLOL HCL 100 MG/10ML IV SOLN
INTRAVENOUS | Status: AC
  Filled 2015-06-21: qty 10

## 2015-06-21 MED ORDER — GLYCOPYRROLATE 0.2 MG/ML IJ SOLN
INTRAMUSCULAR | Status: AC
  Filled 2015-06-21: qty 3

## 2015-06-21 MED ORDER — MIDAZOLAM HCL 2 MG/2ML IJ SOLN
INTRAMUSCULAR | Status: AC
  Filled 2015-06-21: qty 4

## 2015-06-21 MED ORDER — ONDANSETRON HCL 4 MG/2ML IJ SOLN
INTRAMUSCULAR | Status: AC
  Filled 2015-06-21: qty 2

## 2015-06-21 MED ORDER — ROCURONIUM BROMIDE 100 MG/10ML IV SOLN
INTRAVENOUS | Status: DC | PRN
  Administered 2015-06-21: 35 mg via INTRAVENOUS

## 2015-06-21 MED ORDER — LIDOCAINE HCL (CARDIAC) 20 MG/ML IV SOLN
INTRAVENOUS | Status: DC | PRN
  Administered 2015-06-21: 70 mg via INTRAVENOUS

## 2015-06-21 MED ORDER — LACTATED RINGERS IV SOLN
INTRAVENOUS | Status: DC
  Administered 2015-06-21 (×3): via INTRAVENOUS

## 2015-06-21 SURGICAL SUPPLY — 27 items
BRUSH CYTOL CELLEBRITY 1.5X140 (MISCELLANEOUS) IMPLANT
CANISTER SUCTION 2500CC (MISCELLANEOUS) ×3 IMPLANT
CANNULA VESSEL 3MM 2 BLNT TIP (CANNULA) ×9 IMPLANT
CONT SPEC 4OZ CLIKSEAL STRL BL (MISCELLANEOUS) ×3 IMPLANT
COVER DOME SNAP 22 D (MISCELLANEOUS) ×3 IMPLANT
COVER TABLE BACK 60X90 (DRAPES) ×3 IMPLANT
FORCEPS BIOP RJ4 1.8 (CUTTING FORCEPS) IMPLANT
GAUZE SPONGE 4X4 12PLY STRL (GAUZE/BANDAGES/DRESSINGS) IMPLANT
GOWN STRL REUS W/ TWL LRG LVL3 (GOWN DISPOSABLE) ×1 IMPLANT
GOWN STRL REUS W/TWL LRG LVL3 (GOWN DISPOSABLE) ×2
KIT CLEAN ENDO COMPLIANCE (KITS) ×6 IMPLANT
KIT ROOM TURNOVER OR (KITS) ×3 IMPLANT
MARKER SKIN DUAL TIP RULER LAB (MISCELLANEOUS) ×3 IMPLANT
NEEDLE BIOPSY TRANSBRONCH 21G (NEEDLE) IMPLANT
NEEDLE ECHOTIP HI DEF 22GA (NEEDLE) ×3 IMPLANT
NS IRRIG 1000ML POUR BTL (IV SOLUTION) ×3 IMPLANT
PAD ARMBOARD 7.5X6 YLW CONV (MISCELLANEOUS) ×6 IMPLANT
SURGILUBE 2OZ TUBE FLIPTOP (MISCELLANEOUS) ×3 IMPLANT
SYR 20CC LL (SYRINGE) ×3 IMPLANT
SYR 20ML ECCENTRIC (SYRINGE) ×6 IMPLANT
SYR 5ML LL (SYRINGE) ×3 IMPLANT
SYR 5ML LUER SLIP (SYRINGE) ×3 IMPLANT
SYRINGE 10CC LL (SYRINGE) ×9 IMPLANT
TOWEL OR 17X24 6PK STRL BLUE (TOWEL DISPOSABLE) ×3 IMPLANT
TRAP SPECIMEN MUCOUS 40CC (MISCELLANEOUS) IMPLANT
TUBE CONNECTING 20'X1/4 (TUBING) ×2
TUBE CONNECTING 20X1/4 (TUBING) ×4 IMPLANT

## 2015-06-21 NOTE — Transfer of Care (Signed)
Immediate Anesthesia Transfer of Care Note  Patient: Sarah Leonard  Procedure(s) Performed: Procedure(s): VIDEO BRONCHOSCOPY WITH ENDOBRONCHIAL ULTRASOUND (N/A) BRONCHOSCOPY (N/A)  Patient Location: PACU  Anesthesia Type:General  Level of Consciousness: awake, oriented and patient cooperative  Airway & Oxygen Therapy: Patient Spontanous Breathing and Patient connected to face mask oxygen  Post-op Assessment: Report given to RN and Post -op Vital signs reviewed and stable  Post vital signs: Reviewed  Last Vitals:  Filed Vitals:   06/21/15 0931  BP: 111/61  Pulse: 105  Temp: 36.7 C  Resp: 18    Complications: No apparent anesthesia complications

## 2015-06-21 NOTE — Anesthesia Preprocedure Evaluation (Addendum)
Anesthesia Evaluation  Patient identified by MRN, date of birth, ID band Patient awake    Reviewed: Allergy & Precautions, NPO status , Patient's Chart, lab work & pertinent test results  History of Anesthesia Complications Negative for: history of anesthetic complications  Airway Mallampati: II  TM Distance: >3 FB Neck ROM: Full    Dental  (+) Edentulous Upper, Poor Dentition, Chipped,    Pulmonary COPD,  COPD inhaler and oxygen dependent, Current Smoker,     + decreased breath sounds      Cardiovascular hypertension,  Rhythm:Regular Rate:Normal     Neuro/Psych PSYCHIATRIC DISORDERS Depression Bipolar Disorder    GI/Hepatic Neg liver ROS, GERD  ,  Endo/Other  diabetes, Type 2, Insulin Dependent  Renal/GU Renal disease     Musculoskeletal   Abdominal   Peds  Hematology   Anesthesia Other Findings   Reproductive/Obstetrics                           Anesthesia Physical Anesthesia Plan  ASA: III  Anesthesia Plan: General   Post-op Pain Management:    Induction: Intravenous  Airway Management Planned: Oral ETT  Additional Equipment:   Intra-op Plan:   Post-operative Plan: Extubation in OR  Informed Consent: I have reviewed the patients History and Physical, chart, labs and discussed the procedure including the risks, benefits and alternatives for the proposed anesthesia with the patient or authorized representative who has indicated his/her understanding and acceptance.     Plan Discussed with: CRNA and Anesthesiologist  Anesthesia Plan Comments:         Anesthesia Quick Evaluation

## 2015-06-21 NOTE — Procedures (Signed)
Video Bronchoscopy Procedure Note  Pre-Procedure Diagnoses: 1.  Mediastinal Lymphadenopathy  Procedures Performed: 1. Bronchoscopy with Airway Inspection 2. Endobronchial Ultrasound with Needle Aspiration  Consent:  Informed consent was obtained from the patient after discussing the risks and benefits of the procedure including bleeding, infection, pneumothorax, medication allergy, vocal chord injury, and potentially death.  Medications Administered:  Per anesthesia records.  Pre-Procedure Physical Exam: General:  No acute distress. Awake. Alert. ASA Class 1. HEENT:  Moist mucus membranes. No oral ulcers. Mallampati Class 4. Cardiovascular:  Regular rate. No appreciable JVD. Pulmonary:  Clear to auscultation with good aeration bilaterally. Normal work of breathing. Abdomen:  Soft. Nontender. Nondistended. Normal bowel sounds. Musculoskeletal:  Normal bulk and tone. Normal neck flexion & extension. Neurological:  Oriented to person, place, and time. Moving all 4 extremities equally.  Description of Procedure: Patient was brought back to the operating room.  A time out was performed to identify the correct patient and procedure.  Patient was laid recumbent and sedation was administered by anesthesia.  Patient was then endotracheally intubated. Flexible bronchoscope was then inserted into the endotracheal tube. The bronchoscope was then advanced with ease into the trachea.  The endotracheal tube was repositioned with direct visualization of the carina. An airway inspeciton was performed finding copious thick, clear secretions bilaterally. There was significant extrinsic compression of the airways leading to the left upper lobe & lingula as well as the right upper lobe, right middle lobe, and right lower lobe. The main bronchi did not appear compressed. The mucosa was somewhat nodular but not particularly atypical or erythematous. The flexible bronchoscope was then removed from the patient's  airways after suctioning of the remaining secretions.  The endobronchial ultrasound scope was then inserted into the endotracheal tube with ease.  An enlarged lymph node at level 7 was identified with endobronchial ultrasound.  Under direct visualization with the ultrasound a total of 5 passes were performed at level 7.  Hemostasis was directly visualized.  I used the endobronchial ultrasound scope to examine lymph nodes at level 4R & 4L but these were adjacent vessels and unable to be safely biopsied. The remaining secretions were suctioned from the patient's airways and the endobronchial ultrasound scope was removed.    Blood Loss:  Less than 10cc.  Complications:  None.  EBUS Procedure: Level 4R:  single lymph node / 0.5-1cm lymph node / no biopsy performed  Level 4L:  single lymph node / 0.5-1cm lymph node / no biopsy performed Level 7:  multiple lymph nodes / larges 3 cm lymph node / biopsy performed / ROSE / passes 1 - 3 for cytology & cell block / passes 4 & 5 for cell block only  Post Procedure Stat Portable CXR:  Ordered and pending.  Post Procedure Instructions: 1. Notify M.D. for any persistent hemoptysis 2. Nothing by mouth for 1 hour then bedside swallow evaluation by nurse prior to starting diet

## 2015-06-21 NOTE — Anesthesia Procedure Notes (Signed)
Procedure Name: Intubation Date/Time: 06/21/2015 2:33 PM Performed by: Jenne Campus Pre-anesthesia Checklist: Patient identified, Emergency Drugs available, Suction available, Patient being monitored and Timeout performed Patient Re-evaluated:Patient Re-evaluated prior to inductionOxygen Delivery Method: Circle system utilized Preoxygenation: Pre-oxygenation with 100% oxygen Intubation Type: IV induction Ventilation: Mask ventilation without difficulty and Oral airway inserted - appropriate to patient size Laryngoscope Size: Miller and 2 Grade View: Grade I Tube type: Oral Tube size: 8.5 mm Number of attempts: 1 Airway Equipment and Method: Stylet Placement Confirmation: ETT inserted through vocal cords under direct vision,  positive ETCO2,  CO2 detector and breath sounds checked- equal and bilateral Secured at: 21 cm Tube secured with: Tape Dental Injury: Teeth and Oropharynx as per pre-operative assessment

## 2015-06-21 NOTE — Anesthesia Postprocedure Evaluation (Signed)
  Anesthesia Post-op Note  Patient: Sarah Leonard  Procedure(s) Performed: Procedure(s): VIDEO BRONCHOSCOPY WITH ENDOBRONCHIAL ULTRASOUND (N/A) BRONCHOSCOPY (N/A)  Patient Location: PACU  Anesthesia Type:General  Level of Consciousness: awake  Airway and Oxygen Therapy: Patient Spontanous Breathing  Post-op Pain: mild  Post-op Assessment: Post-op Vital signs reviewed LLE Motor Response: Purposeful movement   RLE Motor Response: Purposeful movement        Post-op Vital Signs: Reviewed  Last Vitals:  Filed Vitals:   06/21/15 1800  BP: 128/64  Pulse: 86  Temp: 36.3 C  Resp: 16    Complications: No apparent anesthesia complications

## 2015-06-21 NOTE — Progress Notes (Signed)
Chart reviewed. VSS. Currently patient at OR.  Will reassess next am or sooner should there be an acute medical change in condition.  VEGA, [employer]

## 2015-06-22 LAB — CULTURE, BLOOD (ROUTINE X 2)
CULTURE: NO GROWTH
Culture: NO GROWTH

## 2015-06-22 LAB — GLUCOSE, CAPILLARY
GLUCOSE-CAPILLARY: 290 mg/dL — AB (ref 65–99)
Glucose-Capillary: 146 mg/dL — ABNORMAL HIGH (ref 65–99)

## 2015-06-22 LAB — STOOL CULTURE

## 2015-06-22 MED ORDER — ASPIRIN EC 81 MG PO TBEC: 81.0000 mg | DELAYED_RELEASE_TABLET | Freq: Every day | ORAL | Status: AC

## 2015-06-22 NOTE — Progress Notes (Signed)
Called cancer center to schedule appointment for next week. They said they will call the patient with the appointment and it will probably be next Thursday.  I put number on discharge instructions for patient to call if she doesn't hear from them before then.

## 2015-06-22 NOTE — [place] FL2 (Signed)
[place] MEDICAID FL2 LEVEL OF CARE SCREENING TOOL     IDENTIFICATION  Patient Name: Sarah Leonard Birthdate: 29-Dec-1960 Sex: female Admission Date (Current Location): 06/17/2015  [place] and [place] Number: Kathleen Argue [phone] [place] and Address:  [facility], [place] [address] [place],  [address]      Provider Number: [id number]  Attending Physician Name and Address:  Velvet Bathe, MD  Relative Name and Phone Number:  Shaune Pascal.  [phone]    Current Level of Care: Hospital Recommended Level of Care: [place] Prior Approval Number:    Date Approved/Denied:   PASRR Number:    Discharge Plan: Other (Comment) ([place])    Current Diagnoses: Patient Active Problem List   Diagnosis Date Noted  . Right lower lobe lung mass 15 mm irregular new on 11/8 ct of cest 06/19/2015  . Lung mass   . Lung nodule, solitary   . Mediastinal lymphadenopathy   . Nausea vomiting and diarrhea 06/17/2015  . Hypomagnesemia 06/17/2015  . GERD (gastroesophageal reflux disease) 06/17/2015  . Abdominal pain   . SIADH (syndrome of inappropriate ADH production) ([place]) 06/08/2015  . Type 2 diabetes mellitus with hyperglycemia ([place]) 06/07/2015  . Hyperlipidemia 06/07/2015  . Hyponatremia 06/06/2015  . Headache 06/06/2015  . Chronic respiratory failure ([place]) 06/06/2015  . Bipolar I disorder ([place]) 06/06/2015  . Frontal headache   . Acute respiratory failure with hypoxia ([place]) 11/29/2014  . Hyperglycemia due to type 2 diabetes mellitus ([place]) 11/29/2014  . Obesity 11/29/2014  . Tobacco abuse 11/29/2014  . Noncompliance with medications 11/29/2014  . Lactic acidosis 11/25/2014  . Diabetes mellitus type 2, uncontrolled ([place]) 11/25/2014  . Hypertension 11/25/2014  . COPD exacerbation ([place])   . Axillary abscess 03/03/2013  . Septic shock ([place]) 02/28/2013  . Acute renal failure ([place]) 02/28/2013  . Abscess 02/28/2013   . Acidosis 02/28/2013  . Hyperkalemia 02/28/2013    Orientation ACTIVITIES/SOCIAL BLADDER RESPIRATION    Self, Time, Situation, Place  Active Continent Normal  BEHAVIORAL SYMPTOMS/MOOD NEUROLOGICAL BOWEL NUTRITION STATUS      Continent Diet-Low concentrated sweets (LCS)  PHYSICIAN VISITS COMMUNICATION OF NEEDS Height & Weight Skin    Verbally   242 lbs. Normal          AMBULATORY STATUS RESPIRATION    Supervision limited Normal      Personal Care Assistance Level of Assistance  Bathing, Feeding Bathing Assistance: Independent Feeding assistance: Independent Dressing Assistance: Independent      Functional Limitations Info                SPECIAL CARE FACTORS FREQUENCY                      Additional Factors Info  Code Status, Allergies Code Status Info: Full Code Allergies Info: No known allergies   Insulin Sliding Scale Info: Novolog - Inject 5 Units into the skin 3 (three) times daily with meals.  Lantus-Inject 0.1 mLs (10 Units total) into the skin at bedtime       Current Medications (06/22/2015): Current Facility-Administered Medications  Medication Dose Route Frequency Provider Last Rate Last Dose  . acetaminophen (TYLENOL) tablet 650 mg  650 mg Oral Q6H PRN Ivor Costa, MD   650 mg at 06/18/15 2141   Or  . acetaminophen (TYLENOL) suppository 650 mg  650 mg Rectal Q6H PRN Ivor Costa, MD      . acetaminophen (TYLENOL) tablet 325 mg  325 mg  Oral TID Ivor Costa, MD   325 mg at 06/22/15 0959  . albuterol (PROVENTIL) (2.5 MG/3ML) 0.083% nebulizer solution 2.5 mg  2.5 mg Nebulization Q2H PRN Ivor Costa, MD      . aspirin EC tablet 81 mg  81 mg Oral Daily Ivor Costa, MD   81 mg at 06/22/15 0959  . budesonide-formoterol (SYMBICORT) 80-4.5 MCG/ACT inhaler 2 puff  2 puff Inhalation BID Ivor Costa, MD   2 puff at 06/22/15 0841  . calcium-vitamin D (OSCAL WITH D) 500-200 MG-UNIT per tablet 1 tablet  1 tablet Oral BID Ivor Costa, MD   1 tablet at 06/22/15 0959  .  cholecalciferol (VITAMIN D) tablet 1,000 Units  1,000 Units Oral Daily Ivor Costa, MD   1,000 Units at 06/22/15 [phone]  . dextromethorphan-guaiFENesin (MUCINEX DM) 30-600 MG per 12 hr tablet 1 tablet  1 tablet Oral BID Ivor Costa, MD   1 tablet at 06/22/15 [phone]  . diphenhydrAMINE (BENADRYL) capsule 25 mg  25 mg Oral Q4H PRN Rhetta Mura Schorr, NP   25 mg at 06/21/15 0039  . famotidine (PEPCID) tablet 20 mg  20 mg Oral BID Modena Jansky, MD   20 mg at 06/22/15 0959  . furosemide (LASIX) tablet 10 mg  10 mg Oral Daily Corliss Parish, MD   10 mg at 06/22/15 0958  . heparin injection 5,000 Units  5,000 Units Subcutaneous 3 times per day Ivor Costa, MD   5,000 Units at 06/22/15 0559  . insulin aspart (novoLOG) injection 0-15 Units  0-15 Units Subcutaneous TID WC Modena Jansky, MD   8 Units at 06/22/15 1156  . insulin aspart (novoLOG) injection 0-5 Units  0-5 Units Subcutaneous QHS Modena Jansky, MD   2 Units at 06/21/15 2136  . insulin glargine (LANTUS) injection 20 Units  20 Units Subcutaneous QHS Modena Jansky, MD   20 Units at 06/21/15 2135  . ipratropium-albuterol (DUONEB) 0.5-2.5 (3) MG/3ML nebulizer solution 3 mL  3 mL Nebulization BID Modena Jansky, MD   3 mL at 06/22/15 0841  . lactated ringers infusion   Intravenous Continuous Javier Glazier, MD 10 mL/hr at 06/21/15 2000    . lamoTRIgine (LAMICTAL) tablet 125 mg  125 mg Oral BID Ivor Costa, MD   125 mg at 06/22/15 0958  . LORazepam (ATIVAN) tablet 0.5 mg  0.5 mg Oral Q8H PRN Ivor Costa, MD   0.5 mg at 06/22/15 1006  . morphine 2 MG/ML injection 2 mg  2 mg Intravenous Q4H PRN Ivor Costa, MD   2 mg at 06/22/15 1010  . niacin CR capsule 750 mg  750 mg Oral QHS Ivor Costa, MD   750 mg at 06/21/15 2134  . nicotine (NICODERM CQ - dosed in mg/24 hours) patch 21 mg  21 mg Transdermal Daily Ivor Costa, MD   21 mg at 06/18/15 0840  . ondansetron (ZOFRAN) injection 4 mg  4 mg Intravenous Q8H PRN Ivor Costa, MD   4 mg at 06/17/15 2201  .  predniSONE (DELTASONE) tablet 30 mg  30 mg Oral Q breakfast Modena Jansky, MD   30 mg at 06/22/15 0800  . QUEtiapine (SEROQUEL XR) 24 hr tablet 800 mg  800 mg Oral QHS Ivor Costa, MD   800 mg at 06/21/15 2133  . simvastatin (ZOCOR) tablet 40 mg  40 mg Oral q1800 Ivor Costa, MD   40 mg at 06/21/15 1738  . sodium chloride 0.9 % injection 3 mL  3  mL Intravenous Q12H Ivor Costa, MD   3 mL at 06/22/15 1000  . zolpidem (AMBIEN) tablet 5 mg  5 mg Oral QHS PRN Ivor Costa, MD   5 mg at 06/21/15 2134   Do not use this list as official medication orders. Please verify with discharge summary.  Discharge Medications:   Medication List    STOP taking these medications        guaifenesin 100 MG/5ML syrup  Commonly known as:  ROBITUSSIN      TAKE these medications        acetaminophen 325 MG tablet  Commonly known as:  TYLENOL  Take 325 mg by mouth 3 (three) times daily. Prior to fish oil     albuterol 108 (90 BASE) MCG/ACT inhaler  Commonly known as:  PROVENTIL HFA;VENTOLIN HFA  Inhale 2 puffs into the lungs every 4 (four) hours as needed for wheezing or shortness of breath.     aspirin EC 81 MG tablet  Take 1 tablet (81 mg total) by mouth daily.  Start taking on:  06/25/2015     budesonide-formoterol 80-4.5 MCG/ACT inhaler  Commonly known as:  SYMBICORT  Inhale 2 puffs into the lungs 2 (two) times daily.     calcium-vitamin D 500-200 MG-UNIT tablet  Commonly known as:  OSCAL WITH D  Take 1 tablet by mouth 2 (two) times daily.     cholecalciferol 1000 UNITS tablet  Commonly known as:  VITAMIN D  Take 1,000 Units by mouth daily.     docusate sodium 100 MG capsule  Commonly known as:  COLACE  Take 100 mg by mouth 2 (two) times daily.     gabapentin 300 MG capsule  Commonly known as:  NEURONTIN  Take 600 mg by mouth 3 (three) times daily.     insulin aspart 100 UNIT/ML injection  Commonly known as:  novoLOG  Inject 5 Units into the skin 3 (three) times daily with meals.      insulin glargine 100 UNIT/ML injection  Commonly known as:  LANTUS  Inject 0.1 mLs (10 Units total) into the skin at bedtime.     ipratropium-albuterol 0.5-2.5 (3) MG/3ML Soln  Commonly known as:  DUONEB  Take 3 mLs by nebulization every 6 (six) hours as needed.     lamoTRIgine 100 MG tablet  Commonly known as:  LAMICTAL  Take 100 mg by mouth 2 (two) times daily. Take with 25 mg tablet     lamoTRIgine 25 MG tablet  Commonly known as:  LAMICTAL  Take 25 mg by mouth 2 (two) times daily. Takes with '100mg'$  tablet     LORazepam 0.5 MG tablet  Commonly known as:  ATIVAN  Take 1 tablet (0.5 mg total) by mouth every 8 (eight) hours as needed for anxiety.     metFORMIN 1000 MG tablet  Commonly known as:  GLUCOPHAGE  Take 1 tablet (1,000 mg total) by mouth 2 (two) times daily with a meal.     niacin 750 MG CR tablet  Commonly known as:  NIASPAN  Take 750 mg by mouth at bedtime.     omeprazole 20 MG capsule  Commonly known as:  PRILOSEC  Take 20 mg by mouth daily before supper.     promethazine 12.5 MG tablet  Commonly known as:  PHENERGAN  Take 12.5 mg by mouth every 6 (six) hours as needed for nausea or vomiting.     QUEtiapine 400 MG 24 hr tablet  Commonly known as:  SEROQUEL  XR  Take 800 mg by mouth at bedtime.     simvastatin 40 MG tablet  Commonly known as:  ZOCOR  Take 40 mg by mouth daily at 6 PM.     tiotropium 18 MCG inhalation capsule  Commonly known as:  SPIRIVA  Place 18 mcg into inhaler and inhale daily.     zolpidem 10 MG tablet  Commonly known as:  AMBIEN  Take 5 mg by mouth at bedtime.        Relevant Imaging Results:  Relevant Lab Results:  Recent Labs    Additional Information    Sable Feil, LCSW

## 2015-06-22 NOTE — Care Management Obs Status (Deleted)
[place] NOTIFICATION   Patient Details  Name: Sarah Leonard MRN: [id number] Date of Birth: [birth year]   Medicare Observation Status Notification Given:  Yes    Mahitha Hickling, Rory Percy, RN 06/22/2015, 11:12 AM

## 2015-06-22 NOTE — [place] Notes (Addendum)
Name: Sarah Leonard MRN: [id number] DOB: 10-04-1960    ADMISSION DATE:  06/17/2015 CONSULTATION DATE:  11/8  REFERRING MD : [facility]  CHIEF COMPLAINT:  N/V  BRIEF PATIENT DESCRIPTION: 54 yo obese WF presenting with hyponatremia, nausea, & emesis.  SIGNIFICANT EVENTS  11/6 - Admit 11/10 - Bronchoscopy w/ EBUS guided FNA  STUDIES:  CT Chest W/O 06/19/15 (personally reviewed by me): 1.5 cm right lower lobe nodule that is new compared with prior CT imaging. Patient has progression of mediastinal lymphadenopathy concerning for possible underlying pregnancy versus lymphoma.  HISTORY OF PRESENT ILLNESS:   54 yo WF disabled secondary to ILD/COPD with continued tobacco abuse despite being O 2 dependent. Hx of polysubstance abuse last + for Cocaine in 2014. She also suffers from bipolar disease and is on multiple medications. Just discharged 1 week ago for headache and hyponatremia (common problem for her)and returns with N/V and bloody emesis. CT abd revealed incidental new RLL 15 mm irregular nodule worrisome for cancer. PCCM asked to evaluate. Most likely she will need EBUS or possible open bx for tissue diagnosis. She is now aware of the new finding on lung nodule. Of course tobacco cessation in setting of O2 dependant ILD is reccommended.   SUBJECTIVE: Denies any chest pain or pressure. Denies any cough or dyspnea. Patient tolerated bronchoscopy well.  REVIEW OF SYSTEMS:  No nausea or vomiting. No headache or vision changes.  VITAL SIGNS: Temp:  [97.1 F (36.2 C)-98.8 F (37.1 C)] 97.7 F (36.5 C) (11/11 1000) Pulse Rate:  [85-111] 102 (11/11 1000) Resp:  [9-20] 20 (11/11 1000) BP: (105-140)/(55-100) 123/65 mmHg (11/11 1000) SpO2:  [90 %-100 %] 95 % (11/11 1000) Weight:  [110.2 kg (242 lb 15.2 oz)] 110.2 kg (242 lb 15.2 oz) (11/10 2031)  PHYSICAL EXAMINATION: General: Awake. Alert. Obese female. Integument: Warm & dry. No rash on exposed skin.  HEENT: Moist mucus membranes.  No oral ulcers. Poor dentition.  Pulmonary: Clear bilaterally to auscultation. Normal work of breathing on room air. Abdomen: Soft. Normal bowel sounds. Protuberant.  Musculoskeletal: Normal bulk and tone. No joint effusion appreciated.   Recent Labs Lab 06/18/15 2020 06/19/15 0557 06/20/15 0340  NA 129* 131* 132*  K 4.5 4.0 3.7  CL 89* 92* 93*  CO2 '27 27 28  '$ BUN '12 10 12  '$ CREATININE 0.70 0.59 0.66  GLUCOSE 268* 213* 173*    Recent Labs Lab 06/17/15 0200  HGB 11.8*  HCT 32.7*  WBC 9.6  PLT 345   Dg Chest Port 1 View  06/21/2015  CLINICAL DATA:  54 year old with progressive mediastinal and right hilar lymphadenopathy on recent imaging. Post bronchoscopic mediastinal lymph node biopsy. EXAM: PORTABLE CHEST 1 VIEW COMPARISON:  CT chest 06/19/2015 and earlier. Chest x-ray 06/05/2015 and earlier. FINDINGS: Markedly suboptimal inspiration accounts for atelectasis in the lung bases, left greater than right. No evidence of pneumothorax, pneumomediastinum or mediastinal hematoma. Oval-shaped opacity in the right upper lobe, new since the CT 2 days ago. Heart enlarged but stable. Pulmonary venous hypertension without overt edema. IMPRESSION: 1. No evidence of pneumothorax or pneumomediastinum post bronchoscopy. 2. Markedly suboptimal inspiration accounts for bibasilar atelectasis, left greater than right. 3. Right upper lobe opacity which may represent a small focus of hemorrhage related to the bronchoscopy or may indicate atelectasis. Electronically Signed   By: Evangeline Dakin M.D.   On: 06/21/2015 16:36    ASSESSMENT/PLAN: 54 year old Caucasian female with a long-standing history of tobacco use presenting with hyponatremia. Hyponatremia some improving.  Patient underwent bronchoscopy with endobronchial ultrasound-guided fine-needle aspiration yesterday with preliminary diagnosis highly suspicious for lung cancer. Additionally, with by discussion with pathology it favored small cell  carcinoma which would be consistent with the patient's hyponatremia. These findings were relayed to the patient and her husband at bedside this morning. I also spoke with the patient's attending hospitalist relating these preliminary results and laying out a plan for further workup and staging.  1. Right lower lobe lung nodule & mediastinal adenopathy: Status post fine-needle aspiration with endobronchial ultrasound. Awaiting final pathology. Highly suggestive of malignancy. Recommend medical oncology consultation, PET/CT scan, & brain MRI.  2. Ongoing tobacco use: I counseled the patient for over 3 minutes today on the need for complete tobacco cessation and the counterproductive effects of continued use with potential radiation and chemotherapy and her future. Patient will need full pulmonary function testing which can be performed as an outpatient. 3. Follow-up: She has been scheduled for follow-up appointment in our office on 11/30 at 3:30 PM with Dr. Christinia Gully. I will see if we can move her appointment up sooner.  Sonia Baller Ashok Cordia, M.D. [facility] Pager:  [phone] After 3pm or if no response, call [phone]  06/22/2015, 11:50 AM

## 2015-06-22 NOTE — Discharge Summary (Signed)
Physician Discharge Summary  Sarah Leonard SAY:[id number] DOB: Dec 27, 1960 DOA: 06/17/2015  PCP: Moreen Fowler  Admit date: 06/17/2015 Discharge date: 06/22/2015  Time spent: > 35 minutes  Recommendations for Outpatient Follow-up:  1. Pt will need to follow up with her oncologist and pulmonologist after discharge 2. F/u with biopsy results  Discharge Diagnoses:  Principal Problem:   Nausea vomiting and diarrhea Active Problems:   Hypertension   COPD exacerbation (HCC)   Obesity   Tobacco abuse   Hyponatremia   Chronic respiratory failure (HCC)   Bipolar I disorder (HCC)   Type 2 diabetes mellitus with hyperglycemia (HCC)   Hyperlipidemia   SIADH (syndrome of inappropriate ADH production) (HCC)   Hypomagnesemia   GERD (gastroesophageal reflux disease)   Right lower lobe lung mass 15 mm irregular new on 11/8 ct of cest   Lung mass   Lung nodule, solitary   Mediastinal lymphadenopathy   Discharge Condition: stable  Diet recommendation: Carb modified diet  Filed Weights   06/17/15 0521 06/17/15 2055 06/21/15 2031  Weight: 109.362 kg (241 lb 1.6 oz) 109 kg (240 lb 4.8 oz) 110.2 kg (242 lb 15.2 oz)    History of present illness:  54 y/o with history of chronic hyponatremia, SIADH, DM, HPL, copd, GERD, depression, bipolar d/o that presented to the hospital complaining of nausea, vomiting, diarrhea, abdominal pain, cough, and wheeze  Hospital Course:  Nausea, vomiting, diarrhea and some abdominal pain - resolved. Tolerating diet.  Hyponatremia, subacute on chronic/SIADH. - Baseline sodium probably in the 120s. - May be related to dehydration from GI losses and SIADH. Her psych medications are also contributing. - Does not appear symptomatic from the hyponatremia. - Initially treated with gentle IV normal saline and received a dose of IV Lasix on 11/6. Sodium did not improve. Nephrology was consulted 11/6 PM and started patient on Samsca - With a single dose of Samsca  on 11/6 PM, sodium corrected to 131 today. Discontinued Samsca, started low-dose Lasix and continue fluid restriction. Periodically follow BMP - Psychiatric consulted and discontinued Neurontin (although hyponatremia not a common side effect)  COPD exacerbation/chronic respiratory failure on home oxygen 3 L/m - resolved  Bipolar disorder - Psychiatric consulted for medication management in the context of hyponatremia and discontinued Neurontin. Continue Seroquel and Lamictal. Outpatient psychiatry follow-up..  Tobacco abuse - Cessation counseled. Continue nicotine patch.  Poorly controlled type II DM - A1c on 10/26:10.4. - Continue home medication regimen.  HLD - Continue statins  GERD - stable  Hypomagnesemia - resolved  Anemia - Stable  Mild hyperkalemia - Resolved.  Right lower lobe mass, seen on CT abdomen - Suspicious for malignancy. - Pulmonologist consulted and patient is status post biopsy. Currently awaiting biopsy results and has follow-up with pulmonologist. Also place order for follow-up scheduling with oncologist. - Patient prefers to go home as opposed to waiting for oncology to evaluate while in house.   Procedures:  Lymph node biopsy  Consultations:  Pulmonology  psychiatry  Discharge Exam: Filed Vitals:   06/22/15 1000  BP: 123/65  Pulse: 102  Temp: 97.7 F (36.5 C)  Resp: 20    General: Pt has nad, alert and awake Cardiovascular: rrr, no mrg Respiratory: cta bl, no wheezes, equal chest rise.  Discharge Instructions   Discharge Instructions    Call MD for:  severe uncontrolled pain    Complete by:  As directed      Call MD for:  temperature >100.4    Complete by:  As directed      Diet - low sodium heart healthy    Complete by:  As directed      Discharge instructions    Complete by:  As directed   Please follow up with your oncologist within the next 1 week. Also follow up with the pulmonologist for further evaluation and  recommendations.     Increase activity slowly    Complete by:  As directed           Current Discharge Medication List    CONTINUE these medications which have CHANGED   Details  aspirin EC 81 MG tablet Take 1 tablet (81 mg total) by mouth daily.      CONTINUE these medications which have NOT CHANGED   Details  acetaminophen (TYLENOL) 325 MG tablet Take 325 mg by mouth 3 (three) times daily. Prior to fish oil    budesonide-formoterol (SYMBICORT) 80-4.5 MCG/ACT inhaler Inhale 2 puffs into the lungs 2 (two) times daily.    calcium-vitamin D (OSCAL WITH D) 500-200 MG-UNIT tablet Take 1 tablet by mouth 2 (two) times daily.    cholecalciferol (VITAMIN D) 1000 UNITS tablet Take 1,000 Units by mouth daily.    docusate sodium (COLACE) 100 MG capsule Take 100 mg by mouth 2 (two) times daily.    gabapentin (NEURONTIN) 300 MG capsule Take 600 mg by mouth 3 (three) times daily.    insulin aspart (NOVOLOG) 100 UNIT/ML injection Inject 5 Units into the skin 3 (three) times daily with meals. Qty: 10 mL, Refills: 0    insulin glargine (LANTUS) 100 UNIT/ML injection Inject 0.1 mLs (10 Units total) into the skin at bedtime. Qty: 10 mL, Refills: 0    !! lamoTRIgine (LAMICTAL) 100 MG tablet Take 100 mg by mouth 2 (two) times daily. Take with 25 mg tablet    !! lamoTRIgine (LAMICTAL) 25 MG tablet Take 25 mg by mouth 2 (two) times daily. Takes with '100mg'$  tablet    metFORMIN (GLUCOPHAGE) 1000 MG tablet Take 1 tablet (1,000 mg total) by mouth 2 (two) times daily with a meal. Qty: 60 tablet, Refills: 0    niacin (NIASPAN) 750 MG CR tablet Take 750 mg by mouth at bedtime.    omeprazole (PRILOSEC) 20 MG capsule Take 20 mg by mouth daily before supper.     QUEtiapine (SEROQUEL XR) 400 MG 24 hr tablet Take 800 mg by mouth at bedtime.    simvastatin (ZOCOR) 40 MG tablet Take 40 mg by mouth daily at 6 PM.     tiotropium (SPIRIVA) 18 MCG inhalation capsule Place 18 mcg into inhaler and inhale daily.     zolpidem (AMBIEN) 10 MG tablet Take 5 mg by mouth at bedtime.     albuterol (PROVENTIL HFA;VENTOLIN HFA) 108 (90 BASE) MCG/ACT inhaler Inhale 2 puffs into the lungs every 4 (four) hours as needed for wheezing or shortness of breath. Qty: 1 Inhaler, Refills: 0    ipratropium-albuterol (DUONEB) 0.5-2.5 (3) MG/3ML SOLN Take 3 mLs by nebulization every 6 (six) hours as needed. Qty: 360 mL, Refills: 0    LORazepam (ATIVAN) 0.5 MG tablet Take 1 tablet (0.5 mg total) by mouth every 8 (eight) hours as needed for anxiety. Qty: 30 tablet, Refills: 0    promethazine (PHENERGAN) 12.5 MG tablet Take 12.5 mg by mouth every 6 (six) hours as needed for nausea or vomiting.     !! - Potential duplicate medications found. Please discuss with provider.    STOP taking these medications  guaifenesin (ROBITUSSIN) 100 MG/5ML syrup        No Known Allergies    The results of significant diagnostics from this hospitalization (including imaging, microbiology, ancillary and laboratory) are listed below for reference.    Significant Diagnostic Studies: Dg Chest 2 View  06/05/2015  CLINICAL DATA:  Subacute onset of shortness of breath, cough and headaches. Initial encounter. EXAM: CHEST  2 VIEW COMPARISON:  Chest radiograph performed 05/20/2015, and CTA of the chest performed 11/25/2014 FINDINGS: The lungs are well-aerated. Vascular congestion is noted. Diffuse interstitial opacities are slightly less well characterized due to motion artifact, but appear relatively similar to the prior study, and worsened from April. This is concerning for worsening chronic interstitial lung disease, though superimposed pneumonia cannot be excluded. The heart is borderline normal in size. No acute osseous abnormalities are seen. IMPRESSION: Vascular congestion noted. Diffuse interstitial opacities are slightly less well characterized due to motion artifact, but appear relatively similar to the prior study, and worsened from  April. This is concerning for worsening chronic interstitial lung disease, though superimposed pneumonia cannot be excluded. Electronically Signed   By: Garald Balding M.D.   On: 06/05/2015 21:19   Ct Head Wo Contrast  06/06/2015  CLINICAL DATA:  Multifocal headache for 3 days. History of diabetes, hypertension, substance abuse. EXAM: CT HEAD WITHOUT CONTRAST TECHNIQUE: Contiguous axial images were obtained from the base of the skull through the vertex without intravenous contrast. COMPARISON:  CT head November 25, 2014 FINDINGS: The ventricles and sulci are normal. No intraparenchymal hemorrhage, mass effect nor midline shift. No acute large vascular territory infarcts. Patchy supratentorial white matter hypodensities, advanced for age though, similar to prior imaging. No abnormal extra-axial fluid collections. Basal cisterns are patent. No skull fracture. The included ocular globes and orbital contents are non-suspicious. Partially imaged LEFT maxillary mucosal retention cyst, with improved aeration of the paranasal sinuses from prior imaging. Small chronic LEFT mastoid effusion without air cell coalescence. Soft tissue within the RIGHT external auditory canal most compatible with cerumen. IMPRESSION: No acute intracranial process. Stable mild white matter changes most compatible with chronic small vessel ischemic disease, advanced for age. Electronically Signed   By: Elon Alas M.D.   On: 06/06/2015 06:09   Ct Chest W Contrast  06/19/2015  CLINICAL DATA:  Evaluate mass seen on abdominal CT EXAM: CT CHEST WITH CONTRAST TECHNIQUE: Multidetector CT imaging of the chest was performed during intravenous contrast administration. CONTRAST:  35m OMNIPAQUE IOHEXOL 300 MG/ML  SOLN COMPARISON:  11/25/2014 FINDINGS: THORACIC INLET/BODY WALL: No axillary or thoracic inlet adenopathy. MEDIASTINUM: Mild cardiomegaly. No pericardial effusion. Atherosclerosis, including the coronary arteries. No acute vascular  finding. Mediastinal and hilar lymphadenopathy with progressive enlargement since prior, especially of the subcarinal and right periesophageal nodes. For example, the subcarinal node now measures 3 cm short axis on axial imaging. LUNG WINDOWS: New 16 mm (measured craniocaudal) nodule in the right lower lobe with probable satellite 7 mm nodule. Much of the previously seen lung opacity has resolved, with residual patchy ground-glass density in the apical lungs favoring scar. Linear scarring present in the lingula. Moderate patchy air trapping. UPPER ABDOMEN: No acute findings. OSSEOUS: No acute fracture.  No suspicious lytic or blastic lesions. IMPRESSION: Progressive mediastinal and hilar adenopathy with new 16 mm right lower lobe pulmonary nodule. Lymphoma or bronchogenic carcinoma is the primary diagnostic concern and histologic correlation is recommended, preferably guided by PET-CT. Electronically Signed   By: JMonte FantasiaM.D.   On: 06/19/2015 00:44  Ct Abdomen Pelvis W Contrast  06/17/2015  CLINICAL DATA:  Abdominal pain with nausea, vomiting, and diarrhea. EXAM: CT ABDOMEN AND PELVIS WITH CONTRAST TECHNIQUE: Multidetector CT imaging of the abdomen and pelvis was performed using the standard protocol following bolus administration of intravenous contrast. CONTRAST:  168m OMNIPAQUE IOHEXOL 300 MG/ML  SOLN COMPARISON:  CT scans dated 03/10/2013 and 11/25/2014 FINDINGS: Lower chest: There is an irregular 15 mm mass in the right lower lobe posteriorly, new since 11/25/2014. There is a middle mediastinal mass just to the right of the esophagus and just posterior to the right pulmonary veins entering the left atrium. This mass measures at least 3.8 x 3.3 x 6 cm. There is coronary artery calcification as well as mitral valve annular calcification. Hepatobiliary: There is chronic hepatomegaly and hepatic steatosis. Gallbladder has been removed. No dilated bile ducts. Pancreas: Normal. Spleen: Normal.  Adrenals/Urinary Tract: The adrenal glands are normal. 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 Right kidney and bladder are normal. Stomach/Bowel: Normal. Vascular/Lymphatic: No adenopathy. Aortic and common iliac atherosclerosis. Reproductive: Normal. Other: No mass lesions.  No free air or free fluid. Musculoskeletal: No significant abnormality. IMPRESSION: 1. No acute abnormality of the abdomen. Chronic hepatomegaly and hepatic steatosis. 2. New 15 mm irregular mass in the lower lobe of the right lung. 3. Increased middle mediastinal adenopathy. The findings in the chest are worrisome for malignancy. Less likely, sarcoidosis could give this appearance. CT scan of the chest recommended for further evaluation. Electronically Signed   By: JLorriane ShireM.D.   On: 06/17/2015 13:11   Dg Chest Port 1 View  06/21/2015  CLINICAL DATA:  54year old with progressive mediastinal and right hilar lymphadenopathy on recent imaging. Post bronchoscopic mediastinal lymph node biopsy. EXAM: PORTABLE CHEST 1 VIEW COMPARISON:  CT chest 06/19/2015 and earlier. Chest x-ray 06/05/2015 and earlier. FINDINGS: Markedly suboptimal inspiration accounts for atelectasis in the lung bases, left greater than right. No evidence of pneumothorax, pneumomediastinum or mediastinal hematoma. Oval-shaped opacity in the right upper lobe, new since the CT 2 days ago. Heart enlarged but stable. Pulmonary venous hypertension without overt edema. IMPRESSION: 1. No evidence of pneumothorax or pneumomediastinum post bronchoscopy. 2. Markedly suboptimal inspiration accounts for bibasilar atelectasis, left greater than right. 3. Right upper lobe opacity which may represent a small focus of hemorrhage related to the bronchoscopy or may indicate atelectasis. Electronically Signed   By: TEvangeline DakinM.D.   On: 06/21/2015 16:36    Microbiology: Recent Results (from the past 240 hour(s))  MRSA PCR Screening     Status: None    Collection Time: 06/17/15  5:30 AM  Result Value Ref Range Status   MRSA by PCR NEGATIVE NEGATIVE Final    Comment:        The GeneXpert MRSA Assay (FDA approved for NASAL specimens only), is one component of a comprehensive MRSA colonization surveillance program. It is not intended to diagnose MRSA infection nor to guide or monitor treatment for MRSA infections.   Culture, blood (routine x 2) Call MD if unable to obtain prior to antibiotics being given     Status: None (Preliminary result)   Collection Time: 06/17/15  6:50 AM  Result Value Ref Range Status   Specimen Description BLOOD RIGHT HAND  Final   Special Requests BOTTLES DRAWN AEROBIC ONLY 6CC  Final   Culture NO GROWTH 4 DAYS  Final   Report Status PENDING  Incomplete  Culture, blood (routine x 2) Call MD if unable to  obtain prior to antibiotics being given     Status: None (Preliminary result)   Collection Time: 06/17/15  7:01 AM  Result Value Ref Range Status   Specimen Description BLOOD RIGHT HAND  Final   Special Requests IN PEDIATRIC BOTTLE 3CC  Final   Culture NO GROWTH 4 DAYS  Final   Report Status PENDING  Incomplete  C difficile quick scan w PCR reflex     Status: None   Collection Time: 06/17/15  9:15 AM  Result Value Ref Range Status   C Diff antigen NEGATIVE NEGATIVE Final   C Diff toxin NEGATIVE NEGATIVE Final   C Diff interpretation Negative for toxigenic C. difficile  Final  Stool culture     Status: None   Collection Time: 06/17/15  9:15 AM  Result Value Ref Range Status   Specimen Description STOOL  Final   Special Requests NONE  Final   Culture   Final    NO SALMONELLA, SHIGELLA, CAMPYLOBACTER, YERSINIA, OR E.COLI 0157:[id number] ISOLATED Performed at [facility]    Report Status 06/22/2015 FINAL  Final     Labs: Basic Metabolic Panel:  Recent Labs Lab 06/17/15 0200  06/18/15 1110 06/18/15 1325 06/18/15 2020 06/19/15 0557 06/20/15 0340  NA 115*  < > 124* 125* 129* 131* 132*  K 3.7   < > 4.8 5.2* 4.5 4.0 3.7  CL 77*  < > 89* 88* 89* 92* 93*  CO2 22  < > 21* 21* '27 27 28  '$ GLUCOSE 214*  < > 325* 335* 268* 213* 173*  BUN <5*  < > <5* '7 12 10 12  '$ CREATININE 0.46  < > 0.66 0.73 0.70 0.59 0.66  CALCIUM 9.3  < > 9.5 10.0 10.5* 10.3 10.1  MG 1.0*  --   --   --   --  1.9  --   < > = values in this interval not displayed. Liver Function Tests:  Recent Labs Lab 06/17/15 0200  AST 31  ALT 19  ALKPHOS 98  BILITOT 0.6  PROT 7.2  ALBUMIN 3.7    Recent Labs Lab 06/17/15 0200  LIPASE 25   No results for input(s): AMMONIA in the last 168 hours. CBC:  Recent Labs Lab 06/17/15 0200  WBC 9.6  NEUTROABS 6.7  HGB 11.8*  HCT 32.7*  MCV 82.6  PLT 345   Cardiac Enzymes: No results for input(s): CKTOTAL, CKMB, CKMBINDEX, TROPONINI in the last 168 hours. BNP: BNP (last 3 results)  Recent Labs  06/05/15 2050  BNP 20.5    ProBNP (last 3 results) No results for input(s): PROBNP in the last 8760 hours.  CBG:  Recent Labs Lab 06/21/15 1401 06/21/15 1606 06/21/15 1729 06/21/15 2030 06/22/15 0741  GLUCAP 254* 233* 202* 206* 146*       Signed:  Velvet Bathe  [facility] 06/22/2015, 10:38 AM

## 2015-06-22 NOTE — Clinical Social Work Note (Signed)
Clinical Social Work Assessment  Patient Details  Name: Sarah Leonard MRN: [id number] Date of Birth: 19-May-1961  Date of referral:  06/17/15               Reason for consult:  Facility Placement                Permission sought to share information with:  Facility Sport and exercise psychologist Permission granted to share information::  Yes, Verbal Permission Granted  Name::     [facility] staff  Agency::     Relationship::     Contact Information:  [phone]  Housing/Transportation Living arrangements for the past 2 months:  [place] ([place]) Source of Information:  Patient Patient Interpreter Needed:  None Criminal Activity/Legal Involvement Pertinent to Current Situation/Hospitalization:  No - Comment as needed Significant Relationships:  Significant Other, Siblings Lives with:  Facility Resident Do you feel safe going back to the place where you live?  Yes Need for family participation in patient care:  No (Coment) (Patient alert and oriented x 4)  Care giving concerns:  None expressed by patient.   Social Worker assessment / plan:  CSW talked with patient the morning of 11/16 and confirmed return to [facility]. Patient reported that her sister will transport her back to facility. CSW talked with Wells Guiles at [facility] regarding patient's return and transmitted clinical information to facility.  Employment status:  Disabled (Comment on whether or not currently receiving Disability) Insurance information:  Medicaid In [place] PT Recommendations:  Not assessed at this time Information / Referral to community resources:  Other (Comment Required) (None requested or needed)  Patient/Family's Response to care:  No concerns expressed.  Patient/Family's Understanding of and Emotional Response to Diagnosis, Current Treatment, and Prognosis:  Not discussed.  Emotional Assessment Appearance:  Appears stated age Attitude/Demeanor/Rapport:  Other (Appropriate) Affect  (typically observed):  Appropriate Orientation:  Oriented to Self, Oriented to Place, Oriented to  Time, Oriented to Situation Alcohol / Substance use:  Tobacco Use, Illicit Drugs (Patient reported that she smokes cigarettes and uses illicit drugs-cocaine. Patient reported no alcohol use.) Psych involvement (Current and /or in the community):  No (Comment)  Discharge Needs  Concerns to be addressed:  No discharge needs identified Readmission within the last 30 days:  Yes Current discharge risk:  None Barriers to Discharge:  No Barriers Identified   Sable Feil, LCSW 06/22/2015, 3:42 PM

## 2015-06-22 NOTE — Clinical Social Work Note (Signed)
Patient discharged back to [facility] today. Discharge information transmitted to facility and reviewed by staff person Wells Guiles. Patient transported back to facility by family member (sister).   Sarah Leonard, MSW, LCSW Licensed Clinical Social Worker [place] [phone]

## 2015-06-22 NOTE — Care Management Note (Signed)
Case Management Note  Patient Details  Name: Sarah Leonard MRN: [id number] Date of Birth: [birth year]  Subjective/Objective:      CM following for progression and d/c planning.              Action/Plan: Pt is resident of ALF, [facility] and plan is to return to that facility.  Expected Discharge Date:  06/22/2015  ;                 Expected Discharge Plan:  Assisted Living / Rest Home  In-House Referral:  Clinical Social Work  Discharge planning Services  NA  Post Acute Care Choice:  NA Choice offered to:  NA  DME Arranged:  N/A DME Agency:  NA  HH Arranged:  NA HH Agency:  NA  Status of Service:  Completed, signed off  Medicare Important Message Given:    Date Medicare IM Given:    Medicare IM give by:    Date Additional Medicare IM Given:    Additional Medicare Important Message give by:     If discussed at [place] of Stay Meetings, dates discussed:    Additional Comments:  Adron Bene, RN 06/22/2015, 11:19 AM

## 2015-06-22 NOTE — Care Management Note (Deleted)
Case Management Note  Patient Details  Name: Sarah Leonard MRN: [id number] Date of Birth: 30-Sep-1960  Subjective/Objective:              CM following for progression and d/c planning.      Action/Plan: Pt plans to return to ALF, [facility], pt informed of OBS status since admission.   Expected Discharge Date:  06/22/2015                 Expected Discharge Plan:  Assisted Living / Rest Home  In-House Referral:  Clinical Social Work  Discharge planning Services  NA  Post Acute Care Choice:  NA Choice offered to:  NA  DME Arranged:  N/A DME Agency:  NA  HH Arranged:  NA HH Agency:  NA  Status of Service:  Completed, signed off  Medicare Important Message Given:    Date Medicare IM Given:    Medicare IM give by:    Date Additional Medicare IM Given:    Additional Medicare Important Message give by:     If discussed at [place] of Stay Meetings, dates discussed:    Additional Comments:  Adron Bene, RN 06/22/2015, 11:10 AM

## 2015-06-25 ENCOUNTER — Encounter (HOSPITAL_COMMUNITY): Payer: Self-pay | Admitting: Pulmonary Disease

## 2015-06-26 ENCOUNTER — Ambulatory Visit (HOSPITAL_COMMUNITY)
Admission: RE | Admit: 2015-06-26 | Discharge: 2015-06-26 | Disposition: A | Discharge status: Home / Self Care | Payer: Medicaid Other | Source: Ambulatory Visit | Attending: Pulmonary Disease | Admitting: Pulmonary Disease

## 2015-06-26 ENCOUNTER — Telehealth: Payer: Self-pay | Admitting: Pulmonary Disease

## 2015-06-26 DIAGNOSIS — C3491 Malignant neoplasm of unspecified part of right bronchus or lung: Secondary | ICD-10-CM

## 2015-06-26 DIAGNOSIS — R930 Abnormal findings on diagnostic imaging of skull and head, not elsewhere classified: Secondary | ICD-10-CM | POA: Insufficient documentation

## 2015-06-26 DIAGNOSIS — E119 Type 2 diabetes mellitus without complications: Secondary | ICD-10-CM | POA: Diagnosis not present

## 2015-06-26 DIAGNOSIS — F1410 Cocaine abuse, uncomplicated: Secondary | ICD-10-CM | POA: Insufficient documentation

## 2015-06-26 MED ORDER — GADOBENATE DIMEGLUMINE 529 MG/ML IV SOLN
20.0000 mL | Freq: Once | INTRAVENOUS | Status: AC | PRN
  Administered 2015-06-26: 20 mL via INTRAVENOUS

## 2015-06-26 NOTE — Telephone Encounter (Signed)
I spoke with her sister [name]. Please contact her with the times for all of her appointments.

## 2015-06-26 NOTE — Telephone Encounter (Signed)
Spoke with pt's sister [name], states that she had a missed call from Dr. Ashok Cordia yesterday afternoon.    Dr. Ashok Cordia please advise.  Thanks!

## 2015-06-26 NOTE — Telephone Encounter (Signed)
Sarah Glazier, MD  [name], [name]           Patient newly diagnosed with Small Cell Lung Cancer during recent hospitalization. Already scheduled for f/u with Dr. Melvyn Novas. Need to schedule/order the following ASAP.   1. Referral to Medical Oncology  2. Referral to Radiation Oncology  3. MRI Brain with & without contrast  4. PET CT Scan Whole Body     Orders have been placed. Please advise PCC's. thanks

## 2015-06-26 NOTE — Telephone Encounter (Signed)
Spoke with [name] about final diagnosis. Scheduled for f/u with Dr. Melvyn Novas already but no evidence of scheduling for other needed appointments. Plan for referral to Medical Oncology, Radiation Oncology, MRI of the Brain, & PET CT scan for her Small Cell Lung Cancer.

## 2015-06-27 ENCOUNTER — Telehealth: Payer: Self-pay | Admitting: Internal Medicine

## 2015-06-27 NOTE — Telephone Encounter (Signed)
Pt's sister aware of np appt. On 07/03/15 '@1'$ :45

## 2015-06-28 ENCOUNTER — Telehealth: Payer: Self-pay | Admitting: Pulmonary Disease

## 2015-06-28 NOTE — Telephone Encounter (Signed)
Called spoke with [name] (pt sister). She wanted to verify I did inform her the MRI brain did not show cancer to the brain. I advised her this was correct. She needed nothing further

## 2015-06-29 ENCOUNTER — Ambulatory Visit: Admission: RE | Admit: 2015-06-29 | Payer: Medicaid Other | Source: Ambulatory Visit

## 2015-07-02 ENCOUNTER — Ambulatory Visit
Admission: RE | Admit: 2015-07-02 | Discharge: 2015-07-02 | Disposition: A | Discharge status: Home / Self Care | Payer: Medicaid Other | Source: Ambulatory Visit | Attending: Radiation Oncology | Admitting: Radiation Oncology

## 2015-07-02 ENCOUNTER — Encounter: Payer: Self-pay | Admitting: Radiation Oncology

## 2015-07-02 VITALS — BP 150/88 | HR 105 | Temp 98.3°F | Resp 20 | Ht 63.0 in | Wt 234.4 lb

## 2015-07-02 DIAGNOSIS — F1420 Cocaine dependence, uncomplicated: Secondary | ICD-10-CM | POA: Insufficient documentation

## 2015-07-02 DIAGNOSIS — J449 Chronic obstructive pulmonary disease, unspecified: Secondary | ICD-10-CM | POA: Insufficient documentation

## 2015-07-02 DIAGNOSIS — M545 Low back pain: Secondary | ICD-10-CM | POA: Insufficient documentation

## 2015-07-02 DIAGNOSIS — R918 Other nonspecific abnormal finding of lung field: Secondary | ICD-10-CM

## 2015-07-02 DIAGNOSIS — C3431 Malignant neoplasm of lower lobe, right bronchus or lung: Secondary | ICD-10-CM | POA: Diagnosis present

## 2015-07-02 DIAGNOSIS — C3432 Malignant neoplasm of lower lobe, left bronchus or lung: Secondary | ICD-10-CM

## 2015-07-02 DIAGNOSIS — Z833 Family history of diabetes mellitus: Secondary | ICD-10-CM | POA: Diagnosis not present

## 2015-07-02 DIAGNOSIS — F17210 Nicotine dependence, cigarettes, uncomplicated: Secondary | ICD-10-CM | POA: Diagnosis not present

## 2015-07-02 DIAGNOSIS — N289 Disorder of kidney and ureter, unspecified: Secondary | ICD-10-CM | POA: Diagnosis not present

## 2015-07-02 DIAGNOSIS — Z809 Family history of malignant neoplasm, unspecified: Secondary | ICD-10-CM | POA: Diagnosis not present

## 2015-07-02 DIAGNOSIS — F329 Major depressive disorder, single episode, unspecified: Secondary | ICD-10-CM | POA: Diagnosis not present

## 2015-07-02 DIAGNOSIS — I959 Hypotension, unspecified: Secondary | ICD-10-CM | POA: Insufficient documentation

## 2015-07-02 DIAGNOSIS — G8929 Other chronic pain: Secondary | ICD-10-CM | POA: Insufficient documentation

## 2015-07-02 DIAGNOSIS — E875 Hyperkalemia: Secondary | ICD-10-CM | POA: Insufficient documentation

## 2015-07-02 DIAGNOSIS — Z510 Encounter for antineoplastic radiation therapy: Secondary | ICD-10-CM | POA: Diagnosis not present

## 2015-07-02 DIAGNOSIS — E119 Type 2 diabetes mellitus without complications: Secondary | ICD-10-CM | POA: Diagnosis not present

## 2015-07-02 NOTE — Progress Notes (Signed)
Please see the Nurse Progress Note in the MD Initial Consult Encounter for this patient. 

## 2015-07-02 NOTE — Progress Notes (Addendum)
Thoracic Location of Tumor / Histology:  Right lower lobe 84m nodule,   Patient presented  months ago with symptoms of: headache, hyponatremia,  Nausea vomiting  Bloody emesis  Biopsies of  (if applicable) revealed: Diagnosis 06/21/2015: Bronchoscopy with [place] Dr. NJarold Song FINE NEEDLE ASPIRATION: NEEDLE ASPIRATION: ENDOSCOPIC SPECIMEN A, EBUS 7 NODE (SPECIMEN 1 OF 1 COLLECTED 06/21/2015):MALIGNANT CELLS IDENTIFIED, CONSISTENT WITH SMALL CELL CARCINOMA  Tobacco/Marijuana/Snuff/ETOH use: still smoking cigarettes despite O2 dependent, no alcohol ,hx etoh abuse   Past/Anticipated interventions by cardiothoracic surgery, if any:   Past/Anticipated interventions by medical oncology, if any: Dr. MJulien Nordmannappt 07/03/15, Pet Scan  07/04/15  Signs/Symptoms  Weight changes, if any: NO  Respiratory complaints, if any: COPD/Oxygen dependant,Chronic respiratory failure with hypoxia,  Hemoptysis, if any:  No  Pain issues, if any: Chronic back pain  , chronic head aches daily  due to low sodium,   SAFETY ISSUES:  Prior radiation?  NO  Pacemaker/ICD? NO  Possible current pregnancy? No  Is the patient on methotrexate?  NO  Current Complaints / other details:   G2P1,  1 son living, 1 died in utero,  [facility]  Bipolar, OCD, Multiple psych admissions in the past;  Depression, Diabetic,,Pancreatitis,  HxCocaine abuse, hx SIADH, Mother breast cancer and DM ,   Allergies:NKA BP 150/88 mmHg  Pulse 105  Temp(Src) 98.3 F (36.8 C) (Oral)  Resp 20  Ht '5\' 3"'$  (1.6 m)  Wt 234 lb 6.4 oz (106.323 kg)  BMI 41.53 kg/m2  SpO2 100%  Wt Readings from Last 3 Encounters:  07/02/15 234 lb 6.4 oz (106.323 kg)  06/26/15 242 lb (109.77 kg)  06/21/15 242 lb 15.2 oz (110.2 kg)

## 2015-07-02 NOTE — Progress Notes (Signed)
Radiation Oncology         [phone] ________________________________  Name: Sarah Leonard MRN: [id number]  Date: 07/02/2015  DOB: [birth year]  CC:Sarah Congress, MD     REFERRING PHYSICIAN: Moreen Fowler, MD  DIAGNOSIS: The primary encounter diagnosis was Right lower lobe lung mass 15 mm irregular new on 11/8 ct of cest. Diagnoses of Malignant neoplasm of lower lobe of left lung ([place]) and Cancer of lower lobe of right lung [facility]) were also pertinent to this visit.  HISTORY OF PRESENT ILLNESS::Sarah Leonard is a 54 y.o. female who is seen for an initial consultation visit regarding the patient's diagnosis of small cell lung cancer. The patient lives currently lives in [facility] assisted living.The patient presented to the ED on 05/20/15 with complaints of productive cough, worsening headache and chest congestion about 4 days prior to visit. She was discharged with prednisone, amoxicillin, and albuterol inhaler.The patient was admitted to the ED on 06/05/15 through 06/11/15 with complaints of shortness of breath and weakness. Again, on 06/17/15 through 06/22/15 she was admitted to the ED presenting with hyponatremia, nausea, & bloody emesis.   On 06/21/15 EBUS biopsy of level 7 node was performed which came back positive for malignancy, highly suggestive of small cell lung carcinoma.  A CT scan of the abdomen was performed on 06/17/15 which revealed a 15 mm irregular mass in the lower lobe of the right lung. Subsequent CT scan of the chest on 06/19/15 confirmed the mass along with progressive mediastinal and hilar adenopathy.   MRI scan of the brain on 06/26/15 revealed no findings of intracranial disease.   Today, the patient states breathing is poor upon exertion. Some sharp pain in middle chest. Headaches everyday since 06/22/15. The patient states she has quit smoking.   The patient has continued tobacco abuse despite being O2 dependent. Hx of polysubstance abuse  last + for Cocaine in 2014. She also suffers from bipolar disease and is on multiple medications.   PREVIOUS RADIATION THERAPY: No   PAST MEDICAL HISTORY:  has a past medical history of Diabetes mellitus; Pancreatitis; Anemia; Bipolar 1 disorder ([place]); Alcohol abuse; Respiratory distress; COPD (chronic obstructive pulmonary disease) ([place]); Hypotension; Major depressive disorder ([place]); Cocaine abuse; Chronic back pain; Renal disorder; Acidosis; Hyperkalemia; Abscess; Acute encephalopathy; and Septic shock ([place]).     PAST SURGICAL HISTORY: Past Surgical History  Procedure Laterality Date  . Cholecystectomy    . Appendectomy    . Tonsillectomy    . Incision and drainage abscess Right 03/01/2013    Procedure: INCISION AND DRAINAGE ABSCESS;  Surgeon: Harl Bowie, MD;  Location: [place];  Service: General;  Laterality: Right;  . Cesarean section    . Video bronchoscopy with endobronchial ultrasound N/A 06/21/2015    Procedure: VIDEO BRONCHOSCOPY WITH ENDOBRONCHIAL ULTRASOUND;  Surgeon: Javier Glazier, MD;  Location: [facility];  Service: Thoracic;  Laterality: N/A;  . Flexible bronchoscopy N/A 06/21/2015    Procedure: BRONCHOSCOPY;  Surgeon: Javier Glazier, MD;  Location: [facility];  Service: Thoracic;  Laterality: N/A;     FAMILY HISTORY: family history includes Cancer in her mother; Diabetes in her mother.   SOCIAL HISTORY:  reports that she has been smoking Cigarettes.  She has been smoking about 0.00 packs per day. She has never used smokeless tobacco. She reports that she uses illicit drugs (Cocaine). She reports that she does not drink alcohol.   ALLERGIES: Review of patient's allergies indicates no  known allergies.   MEDICATIONS:  Current Outpatient Prescriptions  Medication Sig Dispense Refill  . acetaminophen (TYLENOL) 325 MG tablet Take 325 mg by mouth 3 (three) times daily. Prior to fish oil    . albuterol (PROVENTIL HFA;VENTOLIN HFA) 108 (90 BASE) MCG/ACT inhaler Inhale 2  puffs into the lungs every 4 (four) hours as needed for wheezing or shortness of breath. 1 Inhaler 0  . aspirin EC 81 MG tablet Take 1 tablet (81 mg total) by mouth daily.    . budesonide-formoterol (SYMBICORT) 80-4.5 MCG/ACT inhaler Inhale 2 puffs into the lungs 2 (two) times daily.    . calcium-vitamin D (OSCAL WITH D) 500-200 MG-UNIT tablet Take 1 tablet by mouth 2 (two) times daily.    . cholecalciferol (VITAMIN D) 1000 UNITS tablet Take 1,000 Units by mouth daily.    Marland Kitchen docusate sodium (COLACE) 100 MG capsule Take 100 mg by mouth 2 (two) times daily.    . insulin aspart (NOVOLOG) 100 UNIT/ML injection Inject 5 Units into the skin 3 (three) times daily with meals. 10 mL 0  . insulin glargine (LANTUS) 100 UNIT/ML injection Inject 0.1 mLs (10 Units total) into the skin at bedtime. 10 mL 0  . ipratropium-albuterol (DUONEB) 0.5-2.5 (3) MG/3ML SOLN Take 3 mLs by nebulization every 6 (six) hours as needed. 360 mL 0  . lamoTRIgine (LAMICTAL) 100 MG tablet Take 100 mg by mouth 2 (two) times daily. Take with 25 mg tablet    . LORazepam (ATIVAN) 0.5 MG tablet Take 1 tablet (0.5 mg total) by mouth every 8 (eight) hours as needed for anxiety. 30 tablet 0  . metFORMIN (GLUCOPHAGE) 1000 MG tablet Take 1 tablet (1,000 mg total) by mouth 2 (two) times daily with a meal. 60 tablet 0  . niacin (NIASPAN) 750 MG CR tablet Take 750 mg by mouth at bedtime.    Marland Kitchen omeprazole (PRILOSEC) 20 MG capsule Take 20 mg by mouth daily before supper.     . promethazine (PHENERGAN) 12.5 MG tablet Take 12.5 mg by mouth every 6 (six) hours as needed for nausea or vomiting.    Marland Kitchen QUEtiapine (SEROQUEL XR) 400 MG 24 hr tablet Take 800 mg by mouth at bedtime.    . simvastatin (ZOCOR) 40 MG tablet Take 40 mg by mouth daily at 6 PM.     . tiotropium (SPIRIVA) 18 MCG inhalation capsule Place 18 mcg into inhaler and inhale daily.    Marland Kitchen zolpidem (AMBIEN) 10 MG tablet Take 5 mg by mouth at bedtime.     . gabapentin (NEURONTIN) 300 MG capsule  Take 600 mg by mouth 3 (three) times daily.    Marland Kitchen lamoTRIgine (LAMICTAL) 25 MG tablet Take 25 mg by mouth 2 (two) times daily. Takes with '100mg'$  tablet     No current facility-administered medications for this encounter.     REVIEW OF SYSTEMS:  A 15 point review of systems is documented in the electronic medical record. This was obtained by the nursing staff. However, I reviewed this with the patient to discuss relevant findings and make appropriate changes.  Pertinent items are noted in HPI.   PHYSICAL EXAM:  height is '5\' 3"'$  (1.6 m) and weight is 234 lb 6.4 oz (106.323 kg). Her oral temperature is 98.3 F (36.8 C). Her blood pressure is 150/88 and her pulse is 105. Her respiration is 20 and oxygen saturation is 100%.   General: Well-developed, in no acute distress HEENT: Normocephalic, atraumatic Cardiovascular: Regular rate and rhythm Respiratory: Scattered  wheezing greater on the right GI: Soft, nontender, normal bowel sounds Extremities: No edema present No palpable cervical, supraclavicular or axillary lymphoadenopathy  ECOG = 2  0 - Asymptomatic (Fully active, able to carry on all predisease activities without restriction)  1 - Symptomatic but completely ambulatory (Restricted in physically strenuous activity but ambulatory and able to carry out work of a light or sedentary nature. For example, light housework, office work)  2 - Symptomatic, <50% in bed during the day (Ambulatory and capable of all self care but unable to carry out any work activities. Up and about more than 50% of waking hours)  3 - Symptomatic, >50% in bed, but not bedbound (Capable of only limited self-care, confined to bed or chair 50% or more of waking hours)  4 - Bedbound (Completely disabled. Cannot carry on any self-care. Totally confined to bed or chair)  5 - Death   Eustace Pen MM, Creech RH, Tormey DC, et al. [phone]). "Toxicity and response criteria of the [place] [employer]". [place]  Oncol. 5 (6): 649-55  _   LABORATORY DATA:  Lab Results  Component Value Date   WBC 9.6 06/17/2015   HGB 11.8* 06/17/2015   HCT 32.7* 06/17/2015   MCV 82.6 06/17/2015   PLT 345 06/17/2015   Lab Results  Component Value Date   NA 132* 06/20/2015   K 3.7 06/20/2015   CL 93* 06/20/2015   CO2 28 06/20/2015   Lab Results  Component Value Date   ALT 19 06/17/2015   AST 31 06/17/2015   ALKPHOS 98 06/17/2015   BILITOT 0.6 06/17/2015      RADIOGRAPHY: Dg Chest 2 View  06/05/2015  CLINICAL DATA:  Subacute onset of shortness of breath, cough and headaches. Initial encounter. EXAM: CHEST  2 VIEW COMPARISON:  Chest radiograph performed 05/20/2015, and CTA of the chest performed 11/25/2014 FINDINGS: The lungs are well-aerated. Vascular congestion is noted. Diffuse interstitial opacities are slightly less well characterized due to motion artifact, but appear relatively similar to the prior study, and worsened from April. This is concerning for worsening chronic interstitial lung disease, though superimposed pneumonia cannot be excluded. The heart is borderline normal in size. No acute osseous abnormalities are seen. IMPRESSION: Vascular congestion noted. Diffuse interstitial opacities are slightly less well characterized due to motion artifact, but appear relatively similar to the prior study, and worsened from April. This is concerning for worsening chronic interstitial lung disease, though superimposed pneumonia cannot be excluded. Electronically Signed   By: Garald Balding M.D.   On: 06/05/2015 21:19   Ct Head Wo Contrast  06/06/2015  CLINICAL DATA:  Multifocal headache for 3 days. History of diabetes, hypertension, substance abuse. EXAM: CT HEAD WITHOUT CONTRAST TECHNIQUE: Contiguous axial images were obtained from the base of the skull through the vertex without intravenous contrast. COMPARISON:  CT head November 25, 2014 FINDINGS: The ventricles and sulci are normal. No intraparenchymal  hemorrhage, mass effect nor midline shift. No acute large vascular territory infarcts. Patchy supratentorial white matter hypodensities, advanced for age though, similar to prior imaging. No abnormal extra-axial fluid collections. Basal cisterns are patent. No skull fracture. The included ocular globes and orbital contents are non-suspicious. Partially imaged LEFT maxillary mucosal retention cyst, with improved aeration of the paranasal sinuses from prior imaging. Small chronic LEFT mastoid effusion without air cell coalescence. Soft tissue within the RIGHT external auditory canal most compatible with cerumen. IMPRESSION: No acute intracranial process. Stable mild white matter changes most compatible with  chronic small vessel ischemic disease, advanced for age. Electronically Signed   By: Elon Alas M.D.   On: 06/06/2015 06:09   Ct Chest W Contrast  06/19/2015  CLINICAL DATA:  Evaluate mass seen on abdominal CT EXAM: CT CHEST WITH CONTRAST TECHNIQUE: Multidetector CT imaging of the chest was performed during intravenous contrast administration. CONTRAST:  61m OMNIPAQUE IOHEXOL 300 MG/ML  SOLN COMPARISON:  11/25/2014 FINDINGS: THORACIC INLET/BODY WALL: No axillary or thoracic inlet adenopathy. MEDIASTINUM: Mild cardiomegaly. No pericardial effusion. Atherosclerosis, including the coronary arteries. No acute vascular finding. Mediastinal and hilar lymphadenopathy with progressive enlargement since prior, especially of the subcarinal and right periesophageal nodes. For example, the subcarinal node now measures 3 cm short axis on axial imaging. LUNG WINDOWS: New 16 mm (measured craniocaudal) nodule in the right lower lobe with probable satellite 7 mm nodule. Much of the previously seen lung opacity has resolved, with residual patchy ground-glass density in the apical lungs favoring scar. Linear scarring present in the lingula. Moderate patchy air trapping. UPPER ABDOMEN: No acute findings. OSSEOUS: No acute  fracture.  No suspicious lytic or blastic lesions. IMPRESSION: Progressive mediastinal and hilar adenopathy with new 16 mm right lower lobe pulmonary nodule. Lymphoma or bronchogenic carcinoma is the primary diagnostic concern and histologic correlation is recommended, preferably guided by PET-CT. Electronically Signed   By: JMonte FantasiaM.D.   On: 06/19/2015 00:44   Mr BJeri CosWNFContrast  06/26/2015  CLINICAL DATA:  History of small cell lung cancer. History of diabetes, cocaine abuse, abscess, septic shock. EXAM: MRI HEAD WITHOUT AND WITH CONTRAST TECHNIQUE: Multiplanar, multiecho pulse sequences of the brain and surrounding structures were obtained without and with intravenous contrast. CONTRAST:  261mMULTIHANCE GADOBENATE DIMEGLUMINE 529 MG/ML IV SOLN COMPARISON:  CT head June 06, 2015 FINDINGS: Mild motion degraded examination. The ventricles and sulci are normal for patient's age. No abnormal parenchymal signal, mass lesions, mass effect. Patchy supratentorial white matter T2 hyperintensities. No abnormal parenchymal enhancement. No reduced diffusion to suggest acute ischemia. No susceptibility artifact to suggest hemorrhage. No abnormal extra-axial fluid collections. No extra-axial masses nor leptomeningeal enhancement. Normal major intracranial vascular flow voids seen at the skull base. Ocular globes and orbital contents are unremarkable though not tailored for evaluation. No suspicious calvarial bone marrow signal. No abnormal sellar expansion. Craniocervical junction maintained. LEFT maxillary sinus air-fluid level. Small LEFT mastoid effusion. IMPRESSION: Mild motion degraded examination without acute intracranial process nor MR findings of intracranial metastasis. Mild to moderate white matter changes most often seen with chronic small vessel ischemic disease. Electronically Signed   By: CoElon Alas.D.   On: 06/26/2015 22:09   Ct Abdomen Pelvis W Contrast  06/17/2015  CLINICAL  DATA:  Abdominal pain with nausea, vomiting, and diarrhea. EXAM: CT ABDOMEN AND PELVIS WITH CONTRAST TECHNIQUE: Multidetector CT imaging of the abdomen and pelvis was performed using the standard protocol following bolus administration of intravenous contrast. CONTRAST:  10088mMNIPAQUE IOHEXOL 300 MG/ML  SOLN COMPARISON:  CT scans dated 03/10/2013 and 11/25/2014 FINDINGS: Lower chest: There is an irregular 15 mm mass in the right lower lobe posteriorly, new since 11/25/2014. There is a middle mediastinal mass just to the right of the esophagus and just posterior to the right pulmonary veins entering the left atrium. This mass measures at least 3.8 x 3.3 x 6 cm. There is coronary artery calcification as well as mitral valve annular calcification. Hepatobiliary: There is chronic hepatomegaly and hepatic steatosis. Gallbladder has been removed. No dilated  bile ducts. Pancreas: Normal. Spleen: Normal. Adrenals/Urinary Tract: The adrenal glands are normal. 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 Right kidney and bladder are normal. Stomach/Bowel: Normal. Vascular/Lymphatic: No adenopathy. Aortic and common iliac atherosclerosis. Reproductive: Normal. Other: No mass lesions.  No free air or free fluid. Musculoskeletal: No significant abnormality. IMPRESSION: 1. No acute abnormality of the abdomen. Chronic hepatomegaly and hepatic steatosis. 2. New 15 mm irregular mass in the lower lobe of the right lung. 3. Increased middle mediastinal adenopathy. The findings in the chest are worrisome for malignancy. Less likely, sarcoidosis could give this appearance. CT scan of the chest recommended for further evaluation. Electronically Signed   By: Lorriane Shire M.D.   On: 06/17/2015 13:11   Dg Chest Port 1 View  06/21/2015  CLINICAL DATA:  54 year old with progressive mediastinal and right hilar lymphadenopathy on recent imaging. Post bronchoscopic mediastinal lymph node biopsy. EXAM: PORTABLE CHEST 1  VIEW COMPARISON:  CT chest 06/19/2015 and earlier. Chest x-ray 06/05/2015 and earlier. FINDINGS: Markedly suboptimal inspiration accounts for atelectasis in the lung bases, left greater than right. No evidence of pneumothorax, pneumomediastinum or mediastinal hematoma. Oval-shaped opacity in the right upper lobe, new since the CT 2 days ago. Heart enlarged but stable. Pulmonary venous hypertension without overt edema. IMPRESSION: 1. No evidence of pneumothorax or pneumomediastinum post bronchoscopy. 2. Markedly suboptimal inspiration accounts for bibasilar atelectasis, left greater than right. 3. Right upper lobe opacity which may represent a small focus of hemorrhage related to the bronchoscopy or may indicate atelectasis. Electronically Signed   By: Evangeline Dakin M.D.   On: 06/21/2015 16:36    IMPRESSION:  Based on current information the patient has limited stage small cell lung cancer.The patient appears to be a good for chemoradiation. She will get a PET scan to complete her staging. Negative brain MRI scan.  PLAN: We discussed the possible side effects and risks of treatment in addition to the possible benefits of treatment. We discussed the protocol for radiation treatment.  All of the patient's questions were answered. The patient does wish to proceed with this treatment.  I anticipate a 6.5 week course of treatment.   We did also briefly discussed the issue of possible prophylactic cranial irradiation.  I will follow up on the PET scan and consultation with Dr.Mohamed to determine an appropriate date for simulation planning.  Will plan to start radiation with either 1st or 2nd cycle of chemotherapy - I will make final decision after PET scan results.   Dr. Julien Nordmann appt 07/03/15, PET Scan  07/04/15 at [facility].     ________________________________    Jodelle Gross, MD, PhD   **Disclaimer: This note was dictated with voice recognition software. Similar sounding words can  inadvertently be transcribed and this note may contain transcription errors which may not have been corrected upon publication of note.**  This document serves as a record of services personally performed by Kyung Rudd, MD. It was created on his behalf by Derek Mound, a trained medical scribe. The creation of this record is based on the scribe's personal observations and the provider's statements to them. This document has been checked and approved by the attending provider.

## 2015-07-03 ENCOUNTER — Ambulatory Visit (HOSPITAL_BASED_OUTPATIENT_CLINIC_OR_DEPARTMENT_OTHER): Payer: Medicaid Other | Admitting: Internal Medicine

## 2015-07-03 ENCOUNTER — Telehealth: Payer: Self-pay | Admitting: Internal Medicine

## 2015-07-03 ENCOUNTER — Other Ambulatory Visit: Payer: Self-pay | Admitting: *Deleted

## 2015-07-03 ENCOUNTER — Encounter: Payer: Self-pay | Admitting: Internal Medicine

## 2015-07-03 ENCOUNTER — Encounter: Payer: Self-pay | Admitting: *Deleted

## 2015-07-03 ENCOUNTER — Other Ambulatory Visit (HOSPITAL_BASED_OUTPATIENT_CLINIC_OR_DEPARTMENT_OTHER): Payer: Medicaid Other

## 2015-07-03 VITALS — BP 146/81 | HR 105 | Temp 97.9°F | Resp 20 | Ht 63.0 in | Wt 232.5 lb

## 2015-07-03 DIAGNOSIS — C3431 Malignant neoplasm of lower lobe, right bronchus or lung: Secondary | ICD-10-CM

## 2015-07-03 DIAGNOSIS — C3491 Malignant neoplasm of unspecified part of right bronchus or lung: Secondary | ICD-10-CM | POA: Diagnosis not present

## 2015-07-03 LAB — CBC WITH DIFFERENTIAL/PLATELET
BASO%: 0.4 % (ref 0.0–2.0)
BASOS ABS: 0 10*3/uL (ref 0.0–0.1)
EOS ABS: 0.1 10*3/uL (ref 0.0–0.5)
EOS%: 1.8 % (ref 0.0–7.0)
HEMATOCRIT: 34.3 % — AB (ref 34.8–46.6)
HGB: 11.6 g/dL (ref 11.6–15.9)
LYMPH#: 1.7 10*3/uL (ref 0.9–3.3)
LYMPH%: 28.8 % (ref 14.0–49.7)
MCH: 29.6 pg (ref 25.1–34.0)
MCHC: 33.9 g/dL (ref 31.5–36.0)
MCV: 87.2 fL (ref 79.5–101.0)
MONO#: 0.4 10*3/uL (ref 0.1–0.9)
MONO%: 6.5 % (ref 0.0–14.0)
NEUT#: 3.7 10*3/uL (ref 1.5–6.5)
NEUT%: 62.5 % (ref 38.4–76.8)
PLATELETS: 296 10*3/uL (ref 145–400)
RBC: 3.93 10*6/uL (ref 3.70–5.45)
RDW: 14.3 % (ref 11.2–14.5)
WBC: 5.9 10*3/uL (ref 3.9–10.3)

## 2015-07-03 LAB — COMPREHENSIVE METABOLIC PANEL (CC13)
ALT: 16 U/L (ref 0–55)
ANION GAP: 16 meq/L — AB (ref 3–11)
AST: 16 U/L (ref 5–34)
Albumin: 3.6 g/dL (ref 3.5–5.0)
Alkaline Phosphatase: 96 U/L (ref 40–150)
BUN: 5.2 mg/dL — ABNORMAL LOW (ref 7.0–26.0)
CALCIUM: 9.4 mg/dL (ref 8.4–10.4)
CHLORIDE: 82 meq/L — AB (ref 98–109)
CO2: 23 mEq/L (ref 22–29)
Creatinine: 0.7 mg/dL (ref 0.6–1.1)
Glucose: 217 mg/dl — ABNORMAL HIGH (ref 70–140)
POTASSIUM: 3.2 meq/L — AB (ref 3.5–5.1)
Sodium: 121 mEq/L — ABNORMAL LOW (ref 136–145)
Total Bilirubin: <0.3 mg/dL (ref 0.20–1.20)
Total Protein: 6.3 g/dL — ABNORMAL LOW (ref 6.4–8.3)

## 2015-07-03 MED ORDER — PROCHLORPERAZINE MALEATE 10 MG PO TABS: 10.0000 mg | ORAL_TABLET | Freq: Four times a day (QID) | ORAL | Status: DC | PRN

## 2015-07-03 MED ORDER — OXYCODONE-ACETAMINOPHEN 5-325 MG PO TABS: 1.0000 | ORAL_TABLET | Freq: Three times a day (TID) | ORAL | Status: DC | PRN

## 2015-07-03 NOTE — Telephone Encounter (Signed)
Gave and printed new sched...emailed Maggie to approval of chemo start date

## 2015-07-03 NOTE — Progress Notes (Signed)
Oncology Nurse Navigator Documentation  Oncology Nurse Navigator Flowsheets 07/03/2015  Navigator Encounter Type Initial MedOnc/spoke with patient and sister today at [facility].  She is new patient of Dr. Julien Nordmann with new dx of small cell.  I gave and explained information on small cell and chemotherapy.  The sister stated she would be bringing patient to tx.  I gave her information on the [facility] transportation just in case the sister was unable to bring her.  Per Dr. Julien Nordmann I also instructed patient on increasing salt in her diet due to low sodium levels.  The patient and sister were thankful for the help and information.   Patient Visit Type Initial  Treatment Phase Abnormal Scans  Barriers/Navigation Needs Education;Transportation  Education Newly Diagnosed Cancer Education;Transport During Treatment;Understanding Cancer/ Treatment Options  Interventions Education Method  Education Method Verbal;Written  Time Spent with Patient 30

## 2015-07-03 NOTE — Progress Notes (Signed)
[place] [phone]   [phone]  CONSULT NOTE  REFERRING PHYSICIAN: Dr. Tera Partridge  REASON FOR CONSULTATION:  54 years old white female recently diagnosed with lung cancer  HPI Sarah Leonard is a 55 y.o. female was past medical history significant for multiple medical problems including history of hypertension, diabetes mellitus, COPD, bipolar disorder, cocaine abuse, GERD, chronic back pain, hyponatremia as well as long history of smoking. The patient was admitted to [facility] few times recently complaining of generalized fatigue and weakness as well as headache in addition to cough and shortness of breath. During her last admission she was complaining of nausea vomiting, diarrhea as well as abdominal pain. She had CT scan of the abdomen and pelvis performed on 06/17/2015 and it showed new 1.5 cm irregular mass in the right lower lobe with increased middle mediastinal adenopathy with findings concerning for malignancy. CT scan of the chest was performed on 06/19/2015 and it showed 1.6 cm nodule in the right lower lobe with probable satellite 0.7 cm nodule. There was also mediastinal and hilar lymphadenopathy was progressive enlargement since the prior studies especially of the subcarinal and right periesophageal nodes. The subcarinal node measured 2.0 cm in the short axis. On 06/21/2015 the patient underwent a video bronchoscopy with endobronchial ultrasound and biopsy of the subcarinal mediastinal lymph nodes. 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  MRI of the brain performed on 06/26/2015 showed no evidence for metastatic disease to the brain. The patient is also scheduled to have a PET scan on 07/04/2015. She was referred to me today for evaluation and consideration of treatment of her condition. The patient was seen by Dr. Lisbeth Renshaw from radiation oncology and expected to  start concurrent radiotherapy soon. When seen today she has an anxiety in addition to headache and shortness breath with central chest pain as well as cough productive of clear sputum. The patient has some occasional nausea with projectile vomiting. She denied having any significant weight loss or night sweats. She has no change in her bowel movement. Family history significant for a mother with breast cancer and father with prostate and bladder cancer. She also had paternal grandmother with stomach cancer. The patient is divorced and has one son. She was accompanied by her sister Sarah Leonard. She currently lives in an assisted living facility. She is currently on disability. She has a history of smoking 1 pack per day for around 40 years and quit 2 weeks ago. She has no history of alcohol but she has a history of cocaine abuse in the past.  HPI  Past Medical History  Diagnosis Date  . Diabetes mellitus   . Pancreatitis   . Anemia   . Bipolar 1 disorder ([place])   . Alcohol abuse   . 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 COPD (chronic obstructive pulmonary disease) ([place])   . Hypotension   . Major depressive disorder ([place])   . Cocaine abuse   . Chronic back pain   . Renal disorder   . Acidosis   . Hyperkalemia   . Abscess   . Acute encephalopathy   . Septic shock [facility])     Past Surgical History  Procedure Laterality Date  . Cholecystectomy    . Appendectomy    . Tonsillectomy    . Incision and drainage abscess Right 03/01/2013    Procedure: INCISION AND DRAINAGE ABSCESS;  Surgeon: Harl Bowie, MD;  Location: [place];  Service: General;  Laterality: Right;  . Cesarean section    . Video bronchoscopy with endobronchial ultrasound N/A 06/21/2015    Procedure: VIDEO BRONCHOSCOPY WITH ENDOBRONCHIAL ULTRASOUND;  Surgeon: Javier Glazier, MD;  Location: [place];  Service: Thoracic;  Laterality: N/A;  . Flexible bronchoscopy N/A 06/21/2015    Procedure: BRONCHOSCOPY;   Surgeon: Javier Glazier, MD;  Location: [place];  Service: Thoracic;  Laterality: N/A;    Family History  Problem Relation Age of Onset  . Diabetes Mother   . Cancer Mother     breast    Social History Social History  Substance Use Topics  . Smoking status: Current Every Day Smoker -- 0.00 packs/day    Types: Cigarettes  . Smokeless tobacco: Never Used  . Alcohol Use: No     Comment: hx of etoh abuse    No Known Allergies  Current Outpatient Prescriptions  Medication Sig Dispense Refill  . acetaminophen (TYLENOL) 325 MG tablet Take 325 mg by mouth 3 (three) times daily. Prior to fish oil    . albuterol (PROVENTIL HFA;VENTOLIN HFA) 108 (90 BASE) MCG/ACT inhaler Inhale 2 puffs into the lungs every 4 (four) hours as needed for wheezing or shortness of breath. 1 Inhaler 0  . aspirin EC 81 MG tablet Take 1 tablet (81 mg total) by mouth daily.    . budesonide-formoterol (SYMBICORT) 80-4.5 MCG/ACT inhaler Inhale 2 puffs into the lungs 2 (two) times daily.    . calcium-vitamin D (OSCAL WITH D) 500-200 MG-UNIT tablet Take 1 tablet by mouth 2 (two) times daily.    . cholecalciferol (VITAMIN D) 1000 UNITS tablet Take 1,000 Units by mouth daily.    Marland Kitchen docusate sodium (COLACE) 100 MG capsule Take 100 mg by mouth 2 (two) times daily.    Marland Kitchen gabapentin (NEURONTIN) 300 MG capsule Take 600 mg by mouth 3 (three) times daily.    . insulin aspart (NOVOLOG) 100 UNIT/ML injection Inject 5 Units into the skin 3 (three) times daily with meals. 10 mL 0  . insulin glargine (LANTUS) 100 UNIT/ML injection Inject 0.1 mLs (10 Units total) into the skin at bedtime. 10 mL 0  . ipratropium-albuterol (DUONEB) 0.5-2.5 (3) MG/3ML SOLN Take 3 mLs by nebulization every 6 (six) hours as needed. 360 mL 0  . lamoTRIgine (LAMICTAL) 100 MG tablet Take 100 mg by mouth 2 (two) times daily. Take with 25 mg tablet    . lamoTRIgine (LAMICTAL) 25 MG tablet Take 25 mg by mouth 2 (two) times daily. Takes with '100mg'$  tablet    .  LORazepam (ATIVAN) 0.5 MG tablet Take 1 tablet (0.5 mg total) by mouth every 8 (eight) hours as needed for anxiety. 30 tablet 0  . metFORMIN (GLUCOPHAGE) 1000 MG tablet Take 1 tablet (1,000 mg total) by mouth 2 (two) times daily with a meal. 60 tablet 0  . niacin (NIASPAN) 750 MG CR tablet Take 750 mg by mouth at bedtime.    Marland Kitchen omeprazole (PRILOSEC) 20 MG capsule Take 20 mg by mouth daily before supper.     Marland Kitchen oxyCODONE-acetaminophen (PERCOCET/ROXICET) 5-325 MG tablet Take 1 tablet by mouth every 8 (eight) hours as needed for severe pain. 30 tablet 0  . prochlorperazine (COMPAZINE) 10 MG tablet Take 1 tablet (10 mg total) by mouth every 6 (six) hours as needed for nausea or vomiting. 30 tablet 0  . promethazine (PHENERGAN) 12.5 MG tablet Take 12.5 mg by mouth every 6 (  six) hours as needed for nausea or vomiting.    Marland Kitchen QUEtiapine (SEROQUEL XR) 400 MG 24 hr tablet Take 800 mg by mouth at bedtime.    . simvastatin (ZOCOR) 40 MG tablet Take 40 mg by mouth daily at 6 PM.     . tiotropium (SPIRIVA) 18 MCG inhalation capsule Place 18 mcg into inhaler and inhale daily.    Marland Kitchen zolpidem (AMBIEN) 10 MG tablet Take 5 mg by mouth at bedtime.      No current facility-administered medications for this visit.    Review of Systems  Constitutional: positive for fatigue Eyes: negative Ears, nose, mouth, throat, and face: negative Respiratory: positive for cough, dyspnea on exertion, pleurisy/chest pain and sputum Cardiovascular: negative Gastrointestinal: positive for nausea Genitourinary:negative Integument/breast: negative Hematologic/lymphatic: negative Musculoskeletal:negative Neurological: positive for headaches and weakness Behavioral/Psych: positive for anxiety and illegal drug usage Endocrine: negative Allergic/Immunologic: negative  Physical Exam  OEV:OJJKK, healthy, no distress, well nourished, well developed and anxious SKIN: skin color, texture, turgor are normal, no rashes or significant  lesions HEAD: Normocephalic, No masses, lesions, tenderness or abnormalities EYES: normal, PERRLA, Conjunctiva are pink and non-injected EARS: External ears normal, Canals clear OROPHARYNX:no exudate, no erythema and lips, buccal mucosa, and tongue normal  NECK: supple, no adenopathy, no JVD LYMPH:  no palpable lymphadenopathy, no hepatosplenomegaly BREAST:not examined LUNGS: clear to auscultation , and palpation HEART: regular rate & rhythm, no murmurs and no gallops ABDOMEN:abdomen soft, non-tender, obese, normal bowel sounds and no masses or organomegaly BACK: Back symmetric, no curvature., No CVA tenderness EXTREMITIES:no joint deformities, effusion, or inflammation, no edema, no skin discoloration, no clubbing  NEURO: alert & oriented x 3 with fluent speech, no focal motor/sensory deficits  PERFORMANCE STATUS: ECOG 1  LABORATORY DATA: Lab Results  Component Value Date   WBC 5.9 07/03/2015   HGB 11.6 07/03/2015   HCT 34.3* 07/03/2015   MCV 87.2 07/03/2015   PLT 296 07/03/2015      Chemistry      Component Value Date/Time   NA 121* 07/03/2015 1334   NA 132* 06/20/2015 0340   K 3.2* 07/03/2015 1334   K 3.7 06/20/2015 0340   CL 93* 06/20/2015 0340   CO2 23 07/03/2015 1334   CO2 28 06/20/2015 0340   BUN 5.2* 07/03/2015 1334   BUN 12 06/20/2015 0340   CREATININE 0.7 07/03/2015 1334   CREATININE 0.66 06/20/2015 0340      Component Value Date/Time   CALCIUM 9.4 07/03/2015 1334   CALCIUM 10.1 06/20/2015 0340   ALKPHOS 96 07/03/2015 1334   ALKPHOS 98 06/17/2015 0200   AST 16 07/03/2015 1334   AST 31 06/17/2015 0200   ALT 16 07/03/2015 1334   ALT 19 06/17/2015 0200   BILITOT <0.30 07/03/2015 1334   BILITOT 0.6 06/17/2015 0200       RADIOGRAPHIC STUDIES: Dg Chest 2 View  06/05/2015  CLINICAL DATA:  Subacute onset of shortness of breath, cough and headaches. Initial encounter. EXAM: CHEST  2 VIEW COMPARISON:  Chest radiograph performed 05/20/2015, and CTA of the  chest performed 11/25/2014 FINDINGS: The lungs are well-aerated. Vascular congestion is noted. Diffuse interstitial opacities are slightly less well characterized due to motion artifact, but appear relatively similar to the prior study, and worsened from April. This is concerning for worsening chronic interstitial lung disease, though superimposed pneumonia cannot be excluded. The heart is borderline normal in size. No acute osseous abnormalities are seen. IMPRESSION: Vascular congestion noted. Diffuse interstitial opacities are slightly less well  characterized due to motion artifact, but appear relatively similar to the prior study, and worsened from April. This is concerning for worsening chronic interstitial lung disease, though superimposed pneumonia cannot be excluded. Electronically Signed   By: Garald Balding M.D.   On: 06/05/2015 21:19   Ct Head Wo Contrast  06/06/2015  CLINICAL DATA:  Multifocal headache for 3 days. History of diabetes, hypertension, substance abuse. EXAM: CT HEAD WITHOUT CONTRAST TECHNIQUE: Contiguous axial images were obtained from the base of the skull through the vertex without intravenous contrast. COMPARISON:  CT head November 25, 2014 FINDINGS: The ventricles and sulci are normal. No intraparenchymal hemorrhage, mass effect nor midline shift. No acute large vascular territory infarcts. Patchy supratentorial white matter hypodensities, advanced for age though, similar to prior imaging. No abnormal extra-axial fluid collections. Basal cisterns are patent. No skull fracture. The included ocular globes and orbital contents are non-suspicious. Partially imaged LEFT maxillary mucosal retention cyst, with improved aeration of the paranasal sinuses from prior imaging. Small chronic LEFT mastoid effusion without air cell coalescence. Soft tissue within the RIGHT external auditory canal most compatible with cerumen. IMPRESSION: No acute intracranial process. Stable mild white matter changes  most compatible with chronic small vessel ischemic disease, advanced for age. Electronically Signed   By: Elon Alas M.D.   On: 06/06/2015 06:09   Ct Chest W Contrast  06/19/2015  CLINICAL DATA:  Evaluate mass seen on abdominal CT EXAM: CT CHEST WITH CONTRAST TECHNIQUE: Multidetector CT imaging of the chest was performed during intravenous contrast administration. CONTRAST:  11m OMNIPAQUE IOHEXOL 300 MG/ML  SOLN COMPARISON:  11/25/2014 FINDINGS: THORACIC INLET/BODY WALL: No axillary or thoracic inlet adenopathy. MEDIASTINUM: Mild cardiomegaly. No pericardial effusion. Atherosclerosis, including the coronary arteries. No acute vascular finding. Mediastinal and hilar lymphadenopathy with progressive enlargement since prior, especially of the subcarinal and right periesophageal nodes. For example, the subcarinal node now measures 3 cm short axis on axial imaging. LUNG WINDOWS: New 16 mm (measured craniocaudal) nodule in the right lower lobe with probable satellite 7 mm nodule. Much of the previously seen lung opacity has resolved, with residual patchy ground-glass density in the apical lungs favoring scar. Linear scarring present in the lingula. Moderate patchy air trapping. UPPER ABDOMEN: No acute findings. OSSEOUS: No acute fracture.  No suspicious lytic or blastic lesions. IMPRESSION: Progressive mediastinal and hilar adenopathy with new 16 mm right lower lobe pulmonary nodule. Lymphoma or bronchogenic carcinoma is the primary diagnostic concern and histologic correlation is recommended, preferably guided by PET-CT. Electronically Signed   By: JMonte FantasiaM.D.   On: 06/19/2015 00:44   Mr BJeri CosWWFContrast  06/26/2015  CLINICAL DATA:  History of small cell lung cancer. History of diabetes, cocaine abuse, abscess, septic shock. EXAM: MRI HEAD WITHOUT AND WITH CONTRAST TECHNIQUE: Multiplanar, multiecho pulse sequences of the brain and surrounding structures were obtained without and with  intravenous contrast. CONTRAST:  250mMULTIHANCE GADOBENATE DIMEGLUMINE 529 MG/ML IV SOLN COMPARISON:  CT head June 06, 2015 FINDINGS: Mild motion degraded examination. The ventricles and sulci are normal for patient's age. No abnormal parenchymal signal, mass lesions, mass effect. Patchy supratentorial white matter T2 hyperintensities. No abnormal parenchymal enhancement. No reduced diffusion to suggest acute ischemia. No susceptibility artifact to suggest hemorrhage. No abnormal extra-axial fluid collections. No extra-axial masses nor leptomeningeal enhancement. Normal major intracranial vascular flow voids seen at the skull base. Ocular globes and orbital contents are unremarkable though not tailored for evaluation. No suspicious calvarial bone marrow signal. No  abnormal sellar expansion. Craniocervical junction maintained. LEFT maxillary sinus air-fluid level. Small LEFT mastoid effusion. IMPRESSION: Mild motion degraded examination without acute intracranial process nor MR findings of intracranial metastasis. Mild to moderate white matter changes most often seen with chronic small vessel ischemic disease. Electronically Signed   By: Elon Alas M.D.   On: 06/26/2015 22:09   Ct Abdomen Pelvis W Contrast  06/17/2015  CLINICAL DATA:  Abdominal pain with nausea, vomiting, and diarrhea. EXAM: CT ABDOMEN AND PELVIS WITH CONTRAST TECHNIQUE: Multidetector CT imaging of the abdomen and pelvis was performed using the standard protocol following bolus administration of intravenous contrast. CONTRAST:  1110m OMNIPAQUE IOHEXOL 300 MG/ML  SOLN COMPARISON:  CT scans dated 03/10/2013 and 11/25/2014 FINDINGS: Lower chest: There is an irregular 15 mm mass in the right lower lobe posteriorly, new since 11/25/2014. There is a middle mediastinal mass just to the right of the esophagus and just posterior to the right pulmonary veins entering the left atrium. This mass measures at least 3.8 x 3.3 x 6 cm. There is coronary  artery calcification as well as mitral valve annular calcification. Hepatobiliary: There is chronic hepatomegaly and hepatic steatosis. Gallbladder has been removed. No dilated bile ducts. Pancreas: Normal. Spleen: Normal. Adrenals/Urinary Tract: The adrenal glands are normal. 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 Right kidney and bladder are normal. Stomach/Bowel: Normal. Vascular/Lymphatic: No adenopathy. Aortic and common iliac atherosclerosis. Reproductive: Normal. Other: No mass lesions.  No free air or free fluid. Musculoskeletal: No significant abnormality. IMPRESSION: 1. No acute abnormality of the abdomen. Chronic hepatomegaly and hepatic steatosis. 2. New 15 mm irregular mass in the lower lobe of the right lung. 3. Increased middle mediastinal adenopathy. The findings in the chest are worrisome for malignancy. Less likely, sarcoidosis could give this appearance. CT scan of the chest recommended for further evaluation. Electronically Signed   By: JLorriane ShireM.D.   On: 06/17/2015 13:11   Dg Chest Port 1 View  06/21/2015  CLINICAL DATA:  54year old with progressive mediastinal and right hilar lymphadenopathy on recent imaging. Post bronchoscopic mediastinal lymph node biopsy. EXAM: PORTABLE CHEST 1 VIEW COMPARISON:  CT chest 06/19/2015 and earlier. Chest x-ray 06/05/2015 and earlier. FINDINGS: Markedly suboptimal inspiration accounts for atelectasis in the lung bases, left greater than right. No evidence of pneumothorax, pneumomediastinum or mediastinal hematoma. Oval-shaped opacity in the right upper lobe, new since the CT 2 days ago. Heart enlarged but stable. Pulmonary venous hypertension without overt edema. IMPRESSION: 1. No evidence of pneumothorax or pneumomediastinum post bronchoscopy. 2. Markedly suboptimal inspiration accounts for bibasilar atelectasis, left greater than right. 3. Right upper lobe opacity which may represent a small focus of hemorrhage related to the  bronchoscopy or may indicate atelectasis. Electronically Signed   By: TEvangeline DakinM.D.   On: 06/21/2015 16:36    ASSESSMENT: This is a very pleasant 54years old white female recently diagnosed with limited stage (T1a, N2, M0) small cell lung cancer presented with right lower lobe pulmonary nodule in addition to mediastinal lymphadenopathy, diagnosed in November 2016 pending further staging workup with a PET scan.   PLAN: I had a lengthy discussion with the patient and her sister today about her current disease stage, prognosis and treatment options. I recommended for the patient to proceed with the PET scan tomorrow as a scheduled to complete the staging workup of her disease. I recommended for the patient a course of concurrent chemoradiation with systemic chemotherapy in the form of carboplatin for AUC  of 5 on day 1 and etoposide 120 MG/M2 on days 1, 2 and 3 with Neulasta support on day 4. The patient will be treated for a total of 4-6 cycles of this regimen. She is expected to receive concurrent radiotherapy during the first 2 cycles of her treatment. I discussed with the patient adverse effect of the chemotherapy including but not limited to alopecia, myelosuppression, nausea and vomiting, peripheral neuropathy, liver or renal dysfunction. I made the decision not to cisplatin in this patient because of her history of diabetes mellitus in addition to concern about delayed nausea.  The patient is expected to start the first cycle of her systemic chemotherapy next week. I will arrange for the patient to have a chemotherapy education class before the first cycle of her chemotherapy. For the headache, I gave the patient prescription for Percocet 5/325 mg by mouth every 8 hours as needed. For nausea, I gave the patient prescription for Compazine 10 mg by mouth every 6 hours as needed for nausea. 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 She was advised to increase her salt intake during this time. The patient would come back for follow-up visit in 2 weeks for reevaluation and management of any adverse effect of her treatment. She was advised to call immediately if she has any concerning symptoms in the interval. The patient voices understanding of current disease status and treatment options and is in agreement with the current care plan.  All questions were answered. The patient knows to call the clinic with any problems, questions or concerns. We can certainly see the patient much sooner if necessary.  Thank you so much for allowing me to participate in the care of [place]. I will continue to follow up the patient with you and assist in her care.  I spent 55 minutes counseling the patient face to face. The total time spent in the appointment was 80 minutes.  Disclaimer: This note was dictated with voice recognition software. Similar sounding words can inadvertently be transcribed and may not be corrected upon review.   Caprice Mccaffrey K. July 03, 2015, 2:59 PM

## 2015-07-04 ENCOUNTER — Ambulatory Visit
Admission: RE | Admit: 2015-07-04 | Discharge: 2015-07-04 | Disposition: A | Discharge status: Home / Self Care | Payer: Medicaid Other | Source: Ambulatory Visit | Attending: Pulmonary Disease | Admitting: Pulmonary Disease

## 2015-07-04 ENCOUNTER — Telehealth: Payer: Self-pay | Admitting: Internal Medicine

## 2015-07-04 ENCOUNTER — Telehealth: Payer: Self-pay | Admitting: *Deleted

## 2015-07-04 DIAGNOSIS — C3491 Malignant neoplasm of unspecified part of right bronchus or lung: Secondary | ICD-10-CM | POA: Insufficient documentation

## 2015-07-04 LAB — GLUCOSE, CAPILLARY
GLUCOSE-CAPILLARY: 215 mg/dL — AB (ref 65–99)
Glucose-Capillary: 221 mg/dL — ABNORMAL HIGH (ref 65–99)

## 2015-07-04 NOTE — Telephone Encounter (Signed)
lvm for pt regarding to 11.29 appt time change....pt ok and aware

## 2015-07-04 NOTE — Telephone Encounter (Signed)
Per staff message and POF I have scheduled appts. Advised scheduler of appts. JMW  

## 2015-07-06 ENCOUNTER — Encounter (HOSPITAL_COMMUNITY): Payer: Medicaid Other

## 2015-07-10 ENCOUNTER — Other Ambulatory Visit: Payer: Medicaid Other

## 2015-07-10 ENCOUNTER — Other Ambulatory Visit (HOSPITAL_BASED_OUTPATIENT_CLINIC_OR_DEPARTMENT_OTHER): Payer: Medicaid Other

## 2015-07-10 ENCOUNTER — Encounter: Payer: Self-pay | Admitting: *Deleted

## 2015-07-10 ENCOUNTER — Other Ambulatory Visit: Payer: Self-pay

## 2015-07-10 ENCOUNTER — Ambulatory Visit: Payer: Medicaid Other

## 2015-07-10 VITALS — BP 143/91 | HR 95 | Temp 98.9°F | Resp 20

## 2015-07-10 DIAGNOSIS — I878 Other specified disorders of veins: Secondary | ICD-10-CM

## 2015-07-10 DIAGNOSIS — C3491 Malignant neoplasm of unspecified part of right bronchus or lung: Secondary | ICD-10-CM | POA: Diagnosis present

## 2015-07-10 LAB — COMPREHENSIVE METABOLIC PANEL (CC13)
ALBUMIN: 3.7 g/dL (ref 3.5–5.0)
ALK PHOS: 94 U/L (ref 40–150)
ALT: 15 U/L (ref 0–55)
ANION GAP: 14 meq/L — AB (ref 3–11)
AST: 21 U/L (ref 5–34)
BILIRUBIN TOTAL: 0.39 mg/dL (ref 0.20–1.20)
BUN: 6.1 mg/dL — ABNORMAL LOW (ref 7.0–26.0)
CALCIUM: 9.5 mg/dL (ref 8.4–10.4)
CO2: 26 meq/L (ref 22–29)
CREATININE: 0.6 mg/dL (ref 0.6–1.1)
Chloride: 79 mEq/L — ABNORMAL LOW (ref 98–109)
Glucose: 171 mg/dl — ABNORMAL HIGH (ref 70–140)
Potassium: 3.2 mEq/L — ABNORMAL LOW (ref 3.5–5.1)
Sodium: 118 mEq/L — ABNORMAL LOW (ref 136–145)
TOTAL PROTEIN: 6.9 g/dL (ref 6.4–8.3)

## 2015-07-10 LAB — CBC WITH DIFFERENTIAL/PLATELET
BASO%: 0.6 % (ref 0.0–2.0)
Basophils Absolute: 0 10*3/uL (ref 0.0–0.1)
EOS ABS: 0.1 10*3/uL (ref 0.0–0.5)
EOS%: 1.3 % (ref 0.0–7.0)
HEMATOCRIT: 36.8 % (ref 34.8–46.6)
HEMOGLOBIN: 12.6 g/dL (ref 11.6–15.9)
LYMPH#: 1.3 10*3/uL (ref 0.9–3.3)
LYMPH%: 22.4 % (ref 14.0–49.7)
MCH: 29.4 pg (ref 25.1–34.0)
MCHC: 34.2 g/dL (ref 31.5–36.0)
MCV: 86 fL (ref 79.5–101.0)
MONO#: 0.4 10*3/uL (ref 0.1–0.9)
MONO%: 6.8 % (ref 0.0–14.0)
NEUT%: 68.9 % (ref 38.4–76.8)
NEUTROS ABS: 3.9 10*3/uL (ref 1.5–6.5)
PLATELETS: 306 10*3/uL (ref 145–400)
RBC: 4.28 10*6/uL (ref 3.70–5.45)
RDW: 14.4 % (ref 11.2–14.5)
WBC: 5.7 10*3/uL (ref 3.9–10.3)

## 2015-07-10 NOTE — Progress Notes (Signed)
Reviewed labs with Dr. Julien Nordmann. Okay to treat per Dr. Julien Nordmann, pt to restrict fluid intake to 1 liter a day and increase sodium and potassium intake in daily diet. Pt aware and verbalizes understanding at this time. After 4 unsuccessful IV attempts. Pt states she want a port a cath and to begin treatment after Port placement. Dr. Julien Nordmann aware and treatment to be held until [facility] can be placed. Diane RN aware and will order port. Pt educated that she will be called to schedule the appointment to have port placed and to let clinic know so that chemo can be scheduled. Pt and sister verbalizes understanding.

## 2015-07-11 ENCOUNTER — Other Ambulatory Visit: Payer: Self-pay | Admitting: Pulmonary Disease

## 2015-07-11 ENCOUNTER — Encounter: Payer: Self-pay | Admitting: Nutrition

## 2015-07-11 ENCOUNTER — Ambulatory Visit: Payer: Self-pay

## 2015-07-11 ENCOUNTER — Ambulatory Visit: Admission: RE | Admit: 2015-07-11 | Payer: Medicaid Other | Source: Ambulatory Visit

## 2015-07-11 ENCOUNTER — Telehealth: Payer: Self-pay | Admitting: *Deleted

## 2015-07-11 ENCOUNTER — Inpatient Hospital Stay: Payer: Self-pay | Admitting: Internal Medicine

## 2015-07-11 ENCOUNTER — Telehealth: Payer: Self-pay | Admitting: Pulmonary Disease

## 2015-07-11 DIAGNOSIS — C3491 Malignant neoplasm of unspecified part of right bronchus or lung: Secondary | ICD-10-CM

## 2015-07-11 NOTE — Telephone Encounter (Signed)
Ok. Visual merchandiser.

## 2015-07-11 NOTE — Telephone Encounter (Signed)
Noted. Nothing further needed at this time. Will sign off.

## 2015-07-11 NOTE — Telephone Encounter (Signed)
Called and spoke to [facility] with [facility] radiology. Pt was initially scheduled for PET whole body. [facility] radiology cancelled the PET whole body because this scan is only needed for melanoma. [facility] radiology staff reordered the PET scan for skull base to thigh and the pt is scheduled for 12/1 at 0730 and the order is needing co-signed by Dr. Ashok Cordia before the appt time.   Dr. Ashok Cordia please advise. Thanks.

## 2015-07-11 NOTE — Telephone Encounter (Signed)
FYI Call from "[place] with PET at [facility] [phone]).  PET has been rescheduled to 07-12-2015 at 0730.  She should be finished by 0930 am.  Hopefully this won't conflict with what may be scheduled for port-a-cath placement.  She was under the impression she would not have today's PET until after Port placed because trouble with IV start yesterday.  WE have to use a peripheral IV for PET.  Please let Dr. Julien Nordmann know."

## 2015-07-12 ENCOUNTER — Ambulatory Visit
Admission: RE | Admit: 2015-07-12 | Discharge: 2015-07-12 | Disposition: A | Discharge status: Home / Self Care | Payer: Medicaid Other | Source: Ambulatory Visit | Attending: Pulmonary Disease | Admitting: Pulmonary Disease

## 2015-07-12 ENCOUNTER — Ambulatory Visit: Payer: Self-pay

## 2015-07-12 DIAGNOSIS — R590 Localized enlarged lymph nodes: Secondary | ICD-10-CM | POA: Insufficient documentation

## 2015-07-12 DIAGNOSIS — I2510 Atherosclerotic heart disease of native coronary artery without angina pectoris: Secondary | ICD-10-CM | POA: Diagnosis not present

## 2015-07-12 DIAGNOSIS — K760 Fatty (change of) liver, not elsewhere classified: Secondary | ICD-10-CM | POA: Diagnosis not present

## 2015-07-12 DIAGNOSIS — Z0189 Encounter for other specified special examinations: Secondary | ICD-10-CM | POA: Diagnosis present

## 2015-07-12 DIAGNOSIS — C3491 Malignant neoplasm of unspecified part of right bronchus or lung: Secondary | ICD-10-CM | POA: Insufficient documentation

## 2015-07-12 DIAGNOSIS — N200 Calculus of kidney: Secondary | ICD-10-CM | POA: Insufficient documentation

## 2015-07-12 LAB — GLUCOSE, CAPILLARY
GLUCOSE-CAPILLARY: 224 mg/dL — AB (ref 65–99)
Glucose-Capillary: 226 mg/dL — ABNORMAL HIGH (ref 65–99)

## 2015-07-12 MED ORDER — FLUDEOXYGLUCOSE F - 18 (FDG) INJECTION
12.3100 | Freq: Once | INTRAVENOUS | Status: AC | PRN
  Administered 2015-07-12: 12.31 via INTRAVENOUS

## 2015-07-13 ENCOUNTER — Telehealth: Payer: Self-pay | Admitting: Pulmonary Disease

## 2015-07-13 NOTE — Telephone Encounter (Signed)
I contacted the Sarah Leonard as well as her sister [name] to notify them of the results of the PET CT scan. Hypermetabolic activity confined to the thorax. Sarah Leonard is scheduled for port placement. She is being followed by Dr. Earlie Server. Instructed them to contact my office if she had any further questions or concerns.

## 2015-07-14 ENCOUNTER — Ambulatory Visit: Payer: Self-pay

## 2015-07-16 ENCOUNTER — Telehealth: Payer: Self-pay | Admitting: *Deleted

## 2015-07-16 ENCOUNTER — Other Ambulatory Visit: Payer: Self-pay | Admitting: Radiology

## 2015-07-16 NOTE — Telephone Encounter (Signed)
I have called and spoke to [facility] the sister regarding her 12/24. They believe that appt is to be canceled. Sent messagfe to MD.

## 2015-07-17 ENCOUNTER — Other Ambulatory Visit: Payer: Self-pay | Admitting: Internal Medicine

## 2015-07-17 ENCOUNTER — Ambulatory Visit (HOSPITAL_COMMUNITY)
Admission: RE | Admit: 2015-07-17 | Discharge: 2015-07-17 | Disposition: A | Discharge status: Home / Self Care | Payer: Medicaid Other | Source: Ambulatory Visit | Attending: Internal Medicine | Admitting: Internal Medicine

## 2015-07-17 ENCOUNTER — Encounter (HOSPITAL_COMMUNITY): Payer: Self-pay

## 2015-07-17 DIAGNOSIS — Z9911 Dependence on respirator [ventilator] status: Secondary | ICD-10-CM | POA: Insufficient documentation

## 2015-07-17 DIAGNOSIS — K859 Acute pancreatitis without necrosis or infection, unspecified: Secondary | ICD-10-CM | POA: Insufficient documentation

## 2015-07-17 DIAGNOSIS — F319 Bipolar disorder, unspecified: Secondary | ICD-10-CM | POA: Insufficient documentation

## 2015-07-17 DIAGNOSIS — C3490 Malignant neoplasm of unspecified part of unspecified bronchus or lung: Secondary | ICD-10-CM | POA: Diagnosis present

## 2015-07-17 DIAGNOSIS — G8929 Other chronic pain: Secondary | ICD-10-CM | POA: Diagnosis not present

## 2015-07-17 DIAGNOSIS — Z7984 Long term (current) use of oral hypoglycemic drugs: Secondary | ICD-10-CM | POA: Diagnosis not present

## 2015-07-17 DIAGNOSIS — C3491 Malignant neoplasm of unspecified part of right bronchus or lung: Secondary | ICD-10-CM | POA: Diagnosis not present

## 2015-07-17 DIAGNOSIS — J449 Chronic obstructive pulmonary disease, unspecified: Secondary | ICD-10-CM | POA: Insufficient documentation

## 2015-07-17 DIAGNOSIS — F17210 Nicotine dependence, cigarettes, uncomplicated: Secondary | ICD-10-CM | POA: Diagnosis not present

## 2015-07-17 DIAGNOSIS — Z794 Long term (current) use of insulin: Secondary | ICD-10-CM | POA: Insufficient documentation

## 2015-07-17 DIAGNOSIS — I272 Other secondary pulmonary hypertension: Secondary | ICD-10-CM | POA: Diagnosis not present

## 2015-07-17 DIAGNOSIS — I878 Other specified disorders of veins: Secondary | ICD-10-CM

## 2015-07-17 DIAGNOSIS — F1010 Alcohol abuse, uncomplicated: Secondary | ICD-10-CM | POA: Insufficient documentation

## 2015-07-17 DIAGNOSIS — E119 Type 2 diabetes mellitus without complications: Secondary | ICD-10-CM | POA: Insufficient documentation

## 2015-07-17 DIAGNOSIS — F1410 Cocaine abuse, uncomplicated: Secondary | ICD-10-CM | POA: Diagnosis not present

## 2015-07-17 DIAGNOSIS — Z7982 Long term (current) use of aspirin: Secondary | ICD-10-CM | POA: Insufficient documentation

## 2015-07-17 DIAGNOSIS — C3431 Malignant neoplasm of lower lobe, right bronchus or lung: Secondary | ICD-10-CM | POA: Insufficient documentation

## 2015-07-17 DIAGNOSIS — M549 Dorsalgia, unspecified: Secondary | ICD-10-CM | POA: Insufficient documentation

## 2015-07-17 DIAGNOSIS — D649 Anemia, unspecified: Secondary | ICD-10-CM | POA: Diagnosis not present

## 2015-07-17 LAB — CBC WITH DIFFERENTIAL/PLATELET
BASOS ABS: 0 10*3/uL (ref 0.0–0.1)
BASOS PCT: 0 %
EOS PCT: 1 %
Eosinophils Absolute: 0.1 10*3/uL (ref 0.0–0.7)
HCT: 33.4 % — ABNORMAL LOW (ref 36.0–46.0)
Hemoglobin: 11.9 g/dL — ABNORMAL LOW (ref 12.0–15.0)
LYMPHS ABS: 1.3 10*3/uL (ref 0.7–4.0)
Lymphocytes Relative: 18 %
MCH: 29.9 pg (ref 26.0–34.0)
MCHC: 35.6 g/dL (ref 30.0–36.0)
MCV: 83.9 fL (ref 78.0–100.0)
MONO ABS: 0.4 10*3/uL (ref 0.1–1.0)
Monocytes Relative: 6 %
NEUTROS PCT: 75 %
Neutro Abs: 5.3 10*3/uL (ref 1.7–7.7)
PLATELETS: 275 10*3/uL (ref 150–400)
RBC: 3.98 MIL/uL (ref 3.87–5.11)
RDW: 13.5 % (ref 11.5–15.5)
WBC: 7.1 10*3/uL (ref 4.0–10.5)

## 2015-07-17 LAB — PROTIME-INR
INR: 1.05 (ref 0.00–1.49)
PROTHROMBIN TIME: 13.9 s (ref 11.6–15.2)

## 2015-07-17 LAB — APTT: aPTT: 27 seconds (ref 24–37)

## 2015-07-17 LAB — BASIC METABOLIC PANEL
Anion gap: 13 (ref 5–15)
BUN: 6 mg/dL (ref 6–20)
CALCIUM: 9.1 mg/dL (ref 8.9–10.3)
CHLORIDE: 81 mmol/L — AB (ref 101–111)
CO2: 29 mmol/L (ref 22–32)
CREATININE: 0.42 mg/dL — AB (ref 0.44–1.00)
GFR calc non Af Amer: >60 mL/min (ref >60)
GLUCOSE: 182 mg/dL — AB (ref 65–99)
Potassium: 3.1 mmol/L — ABNORMAL LOW (ref 3.5–5.1)
Sodium: 123 mmol/L — ABNORMAL LOW (ref 135–145)

## 2015-07-17 LAB — GLUCOSE, CAPILLARY: GLUCOSE-CAPILLARY: 173 mg/dL — AB (ref 65–99)

## 2015-07-17 MED ORDER — LIDOCAINE HCL 1 % IJ SOLN
INTRAMUSCULAR | Status: AC
  Filled 2015-07-17: qty 20

## 2015-07-17 MED ORDER — MIDAZOLAM HCL 2 MG/2ML IJ SOLN
INTRAMUSCULAR | Status: AC | PRN
  Administered 2015-07-17 (×5): 1 mg via INTRAVENOUS

## 2015-07-17 MED ORDER — SODIUM CHLORIDE 0.9 % IV SOLN
INTRAVENOUS | Status: DC
  Administered 2015-07-17: 13:00:00 via INTRAVENOUS

## 2015-07-17 MED ORDER — CEFAZOLIN SODIUM-DEXTROSE 2-3 GM-% IV SOLR
2.0000 g | INTRAVENOUS | Status: AC
  Administered 2015-07-17: 2 g via INTRAVENOUS

## 2015-07-17 MED ORDER — HEPARIN SOD (PORK) LOCK FLUSH 100 UNIT/ML IV SOLN
INTRAVENOUS | Status: AC | PRN
  Administered 2015-07-17: 500 [IU]

## 2015-07-17 MED ORDER — MIDAZOLAM HCL 2 MG/2ML IJ SOLN
INTRAMUSCULAR | Status: AC
  Filled 2015-07-17: qty 6

## 2015-07-17 MED ORDER — LIDOCAINE-EPINEPHRINE 2 %-1:100000 IJ SOLN
INTRAMUSCULAR | Status: AC
  Filled 2015-07-17: qty 1

## 2015-07-17 MED ORDER — FENTANYL CITRATE (PF) 100 MCG/2ML IJ SOLN
INTRAMUSCULAR | Status: AC
  Filled 2015-07-17: qty 4

## 2015-07-17 MED ORDER — CEFAZOLIN SODIUM-DEXTROSE 2-3 GM-% IV SOLR
INTRAVENOUS | Status: AC
  Administered 2015-07-17: 2 g via INTRAVENOUS
  Filled 2015-07-17: qty 50

## 2015-07-17 MED ORDER — FENTANYL CITRATE (PF) 100 MCG/2ML IJ SOLN
INTRAMUSCULAR | Status: AC | PRN
  Administered 2015-07-17 (×2): 50 ug via INTRAVENOUS

## 2015-07-17 MED ORDER — HEPARIN SOD (PORK) LOCK FLUSH 100 UNIT/ML IV SOLN
INTRAVENOUS | Status: AC
  Filled 2015-07-17: qty 5

## 2015-07-17 NOTE — Discharge Instructions (Signed)
Implanted Port Insertion, Care After °Refer to this sheet in the next few weeks. These instructions provide you with information on caring for yourself after your procedure. Your health care provider may also give you more specific instructions. Your treatment has been planned according to current medical practices, but problems sometimes occur. Call your health care provider if you have any problems or questions after your procedure. °WHAT TO EXPECT AFTER THE PROCEDURE °After your procedure, it is typical to have the following:  °· Discomfort at the port insertion site. Ice packs to the area will help. °· Bruising on the skin over the port. This will subside in 3-4 days. °HOME CARE INSTRUCTIONS °· After your port is placed, you will get a manufacturer's information card. The card has information about your port. Keep this card with you at all times.   °· Know what kind of port you have. There are many types of ports available.   °· Wear a medical alert bracelet in case of an emergency. This can help alert health care workers that you have a port.   °· The port can stay in for as long as your health care provider believes it is necessary.   °· A home health care nurse may give medicines and take care of the port.   °· You or a family member can get special training and directions for giving medicine and taking care of the port at home.   °SEEK MEDICAL CARE IF:  °· Your port does not flush or you are unable to get a blood return.   °· You have a fever or chills. °SEEK IMMEDIATE MEDICAL CARE IF: °· You have new fluid or pus coming from your incision.   °· You notice a bad smell coming from your incision site.   °· You have swelling, pain, or more redness at the incision or port site.   °· You have chest pain or shortness of breath. °  °This information is not intended to replace advice given to you by your health care provider. Make sure you discuss any questions you have with your health care provider. °  °Document  Released: 05/18/2013 Document Revised: 08/02/2013 Document Reviewed: 05/18/2013 °[employer]vier Interactive Patient Education ©2016 [employer] °Implanted Port Home Guide °An implanted port is a type of central line that is placed under the skin. Central lines are used to provide IV access when treatment or nutrition needs to be given through a person's veins. Implanted ports are used for long-term IV access. An implanted port may be placed because:  °· You need IV medicine that would be irritating to the small veins in your hands or arms.   °· You need long-term IV medicines, such as antibiotics.   °· You need IV nutrition for a long period.   °· You need frequent blood draws for lab tests.   °· You need dialysis.   °Implanted ports are usually placed in the chest area, but they can also be placed in the upper arm, the abdomen, or the leg. An implanted port has two main parts:  °· Reservoir. The reservoir is round and will appear as a small, raised area under your skin. The reservoir is the part where a needle is inserted to give medicines or draw blood.   °· Catheter. The catheter is a thin, flexible tube that extends from the reservoir. The catheter is placed into a large vein. Medicine that is inserted into the reservoir goes into the catheter and then into the vein.   °HOW WILL I CARE FOR MY INCISION SITE? °Do not get the   incision site wet. Bathe or shower as directed by your health care provider.  °HOW IS MY PORT ACCESSED? °Special steps must be taken to access the port:  °· Before the port is accessed, a numbing cream can be placed on the skin. This helps numb the skin over the port site.   °· Your health care provider uses a sterile technique to access the port. °· Your health care provider must put on a mask and sterile gloves. °· The skin over your port is cleaned carefully with an antiseptic and allowed to dry. °· The port is gently pinched between sterile gloves, and a needle is inserted into the  port. °· Only "non-coring" port needles should be used to access the port. Once the port is accessed, a blood return should be checked. This helps ensure that the port is in the vein and is not clogged.   °· If your port needs to remain accessed for a constant infusion, a clear (transparent) bandage will be placed over the needle site. The bandage and needle will need to be changed every week, or as directed by your health care provider.   °· Keep the bandage covering the needle clean and dry. Do not get it wet. Follow your health care provider's instructions on how to take a shower or bath while the port is accessed.   °· If your port does not need to stay accessed, no bandage is needed over the port.   °WHAT IS FLUSHING? °Flushing helps keep the port from getting clogged. Follow your health care provider's instructions on how and when to flush the port. Ports are usually flushed with saline solution or a medicine called heparin. The need for flushing will depend on how the port is used.  °· If the port is used for intermittent medicines or blood draws, the port will need to be flushed:   °· After medicines have been given.   °· After blood has been drawn.   °· As part of routine maintenance.   °· If a constant infusion is running, the port may not need to be flushed.   °HOW LONG WILL MY PORT STAY IMPLANTED? °The port can stay in for as long as your health care provider thinks it is needed. When it is time for the port to come out, surgery will be done to remove it. The procedure is similar to the one performed when the port was put in.  °WHEN SHOULD I SEEK IMMEDIATE MEDICAL CARE? °When you have an implanted port, you should seek immediate medical care if:  °· You notice a bad smell coming from the incision site.   °· You have swelling, redness, or drainage at the incision site.   °· You have more swelling or pain at the port site or the surrounding area.   °· You have a fever that is not controlled with  medicine. °  °This information is not intended to replace advice given to you by your health care provider. Make sure you discuss any questions you have with your health care provider. °  °Document Released: 07/28/2005 Document Revised: 05/18/2013 Document Reviewed: 04/04/2013 °[employer] Interactive Patient Education ©2016 [employer] ° ° °Moderate Conscious Sedation, Adult, Care After °Refer to this sheet in the next few weeks. These instructions provide you with information on caring for yourself after your procedure. Your health care provider may also give you more specific instructions. Your treatment has been planned according to current medical practices, but problems sometimes occur. Call your health care provider if you have any problems or questions   after your procedure. °WHAT TO EXPECT AFTER THE PROCEDURE  °After your procedure: °· You may feel sleepy, clumsy, and have poor balance for several hours. °· Vomiting may occur if you eat too soon after the procedure. °HOME CARE INSTRUCTIONS °· Do not participate in any activities where you could become injured for at least 24 hours. Do not: °¨ Drive. °¨ Swim. °¨ Ride a bicycle. °¨ Operate heavy machinery. °¨ Cook. °¨ Use power tools. °¨ Climb ladders. °¨ Work from a high place. °· Do not make important decisions or sign legal documents until you are improved. °· If you vomit, drink water, juice, or soup when you can drink without vomiting. Make sure you have little or no nausea before eating solid foods. °· Only take over-the-counter or prescription medicines for pain, discomfort, or fever as directed by your health care provider. °· Make sure you and your family fully understand everything about the medicines given to you, including what side effects may occur. °· You should not drink alcohol, take sleeping pills, or take medicines that cause drowsiness for at least 24 hours. °· If you smoke, do not smoke without supervision. °· If you are feeling better, you  may resume normal activities 24 hours after you were sedated. °· Keep all appointments with your health care provider. °SEEK MEDICAL CARE IF: °· Your skin is pale or bluish in color. °· You continue to feel nauseous or vomit. °· Your pain is getting worse and is not helped by medicine. °· You have bleeding or swelling. °· You are still sleepy or feeling clumsy after 24 hours. °SEEK IMMEDIATE MEDICAL CARE IF: °· You develop a rash. °· You have difficulty breathing. °· You develop any type of allergic problem. °· You have a fever. °MAKE SURE YOU: °· Understand these instructions. °· Will watch your condition. °· Will get help right away if you are not doing well or get worse. °  °This information is not intended to replace advice given to you by your health care provider. Make sure you discuss any questions you have with your health care provider. °  °Document Released: 05/18/2013 Document Revised: 08/18/2014 Document Reviewed: 05/18/2013 °[employer] Interactive Patient Education ©2016 [employer] ° °

## 2015-07-17 NOTE — Telephone Encounter (Signed)
PET was performed 07-12-2015 other appointments rescheduled

## 2015-07-17 NOTE — Procedures (Signed)
R IJ Port cathter placement with US and fluoroscopy No complication No blood loss. See complete dictation in [facility] P[facility].  

## 2015-07-17 NOTE — H&P (Signed)
Chief Complaint: Patient was seen in consultation today for port a cath placement    Referring Physician(s): Mohamed,Mohamed  History of Present Illness: Sarah Leonard is a 54 y.o. 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 Additional past medical history as listed below.  Past Medical History  Diagnosis Date  . Diabetes mellitus   . Pancreatitis   . Anemia   . Bipolar 1 disorder ([place])   . Alcohol abuse   . 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 COPD (chronic obstructive pulmonary disease) ([place])   . Hypotension   . Major depressive disorder ([place])   . Cocaine abuse   . Chronic back pain   . Renal disorder   . Acidosis   . Hyperkalemia   . Abscess   . Acute encephalopathy   . Septic shock [facility])     Past Surgical History  Procedure Laterality Date  . Cholecystectomy    . Appendectomy    . Tonsillectomy    . Incision and drainage abscess Right 03/01/2013    Procedure: INCISION AND DRAINAGE ABSCESS;  Surgeon: Harl Bowie, MD;  Location: [place];  Service: General;  Laterality: Right;  . Cesarean section    . Video bronchoscopy with endobronchial ultrasound N/A 06/21/2015    Procedure: VIDEO BRONCHOSCOPY WITH ENDOBRONCHIAL ULTRASOUND;  Surgeon: Javier Glazier, MD;  Location: [place];  Service: Thoracic;  Laterality: N/A;  . Flexible bronchoscopy N/A 06/21/2015    Procedure: BRONCHOSCOPY;  Surgeon: Javier Glazier, MD;  Location: [place];  Service: Thoracic;  Laterality: N/A;    Allergies: Review of patient's allergies indicates no known allergies.  Medications: Prior to Admission medications   Medication Sig Start Date End Date Taking? Authorizing Provider  acetaminophen (TYLENOL) 325 MG tablet Take 325 mg by mouth 3 (three) times daily. Prior to fish oil   Yes Historical Provider, MD  albuterol (PROVENTIL  HFA;VENTOLIN HFA) 108 (90 BASE) MCG/ACT inhaler Inhale 2 puffs into the lungs every 4 (four) hours as needed for wheezing or shortness of breath. 80/9/98  Yes Delora Fuel, MD  aspirin EC 81 MG tablet Take 1 tablet (81 mg total) by mouth daily. 06/25/15  Yes Velvet Bathe, MD  budesonide-formoterol (SYMBICORT) 80-4.5 MCG/ACT inhaler Inhale 2 puffs into the lungs 2 (two) times daily.   Yes Historical Provider, MD  calcium-vitamin D (OSCAL WITH D) 500-200 MG-UNIT tablet Take 1 tablet by mouth 2 (two) times daily.   Yes Historical Provider, MD  docusate sodium (COLACE) 100 MG capsule Take 100 mg by mouth 2 (two) times daily.   Yes Historical Provider, MD  insulin aspart (NOVOLOG) 100 UNIT/ML injection Inject 5 Units into the skin 3 (three) times daily with meals. 06/11/15  Yes Orson Eva, MD  insulin glargine (LANTUS) 100 UNIT/ML injection Inject 0.1 mLs (10 Units total) into the skin at bedtime. 06/11/15  Yes Orson Eva, MD  lamoTRIgine (LAMICTAL) 100 MG tablet Take 100 mg by mouth 2 (two) times daily. Take with 25 mg tablet   Yes Historical Provider, MD  lamoTRIgine (LAMICTAL) 25 MG tablet Take 25 mg by mouth 2 (two) times daily. Takes with '100mg'$  tablet   Yes Historical Provider, MD  LORazepam (ATIVAN) 0.5 MG tablet Take 1 tablet (0.5 mg total) by mouth every 8 (eight) hours as needed for anxiety. 11/29/14  Yes Louellen Molder, MD  metFORMIN (GLUCOPHAGE) 1000 MG tablet Take 1 tablet (1,000 mg total) by mouth 2 (two) times daily with a meal. 11/29/14  Yes Nishant Dhungel, MD  niacin (NIASPAN) 750 MG CR tablet Take 750 mg by mouth at bedtime.   Yes Historical Provider, MD  omeprazole (PRILOSEC) 20 MG capsule Take 20 mg by mouth daily before supper.    Yes Historical Provider, MD  oxyCODONE-acetaminophen (PERCOCET/ROXICET) 5-325 MG tablet Take 1 tablet by mouth every 8 (eight) hours as needed for severe pain. 07/03/15  Yes Curt Bears, MD  prochlorperazine (COMPAZINE) 10 MG tablet Take 1 tablet (10 mg total)  by mouth every 6 (six) hours as needed for nausea or vomiting. 07/03/15  Yes Curt Bears, MD  QUEtiapine (SEROQUEL XR) 400 MG 24 hr tablet Take 800 mg by mouth at bedtime.   Yes Historical Provider, MD  simvastatin (ZOCOR) 40 MG tablet Take 40 mg by mouth daily at 6 PM.    Yes Historical Provider, MD  zolpidem (AMBIEN) 10 MG tablet Take 5 mg by mouth at bedtime.    Yes Historical Provider, MD  cholecalciferol (VITAMIN D) 1000 UNITS tablet Take 1,000 Units by mouth daily.    Historical Provider, MD  gabapentin (NEURONTIN) 300 MG capsule Take 600 mg by mouth 3 (three) times daily.    Historical Provider, MD  ipratropium-albuterol (DUONEB) 0.5-2.5 (3) MG/3ML SOLN Take 3 mLs by nebulization every 6 (six) hours as needed. 06/11/15   Orson Eva, MD  promethazine (PHENERGAN) 12.5 MG tablet Take 12.5 mg by mouth every 6 (six) hours as needed for nausea or vomiting.    Historical Provider, MD  tiotropium (SPIRIVA) 18 MCG inhalation capsule Place 18 mcg into inhaler and inhale daily.    Historical Provider, MD     Family History  Problem Relation Age of Onset  . Diabetes Mother   . Cancer Mother     breast    Social History   Social History  . Marital Status: Single    Spouse Name: N/A  . Number of Children: N/A  . Years of Education: N/A   Social History Main Topics  . Smoking status: Current Every Day Smoker -- 0.00 packs/day    Types: Cigarettes  . Smokeless tobacco: Never Used  . Alcohol Use: No     Comment: hx of etoh abuse  . Drug Use: Yes    Special: Cocaine  . Sexual Activity: Not Asked   Other Topics Concern  . None   Social History Narrative      Review of Systems  Constitutional: Negative for fever and chills.  Respiratory: Positive for cough and shortness of breath.   Cardiovascular:       Occasional mid chest pain noted.  Gastrointestinal: Negative for abdominal pain and blood in stool.       Intermittent nausea and vomiting  Genitourinary: Negative for  dysuria and hematuria.  Musculoskeletal:       Occasional back pain  Neurological: Positive for headaches.  Psychiatric/Behavioral: The patient is nervous/anxious.     Vital Signs: BP 175/98 mmHg  Pulse 102  Temp(Src) 97.5 F (36.4 C) (Oral)  Resp 18  SpO2 91%  Physical Exam  Constitutional: She is oriented to person, place, and time. She appears well-developed and well-nourished.  Cardiovascular:  Slightly tachycardia but regular rhythm  Pulmonary/Chest: Effort normal.  Scattered rhonchi noted bilaterally  Abdominal: Soft. Bowel sounds are normal. There is no tenderness.  Musculoskeletal: Normal range of motion.  Neurological: She is alert and  oriented to person, place, and time.    Mallampati Score:     Imaging: Ct Chest W Contrast  06/19/2015  CLINICAL DATA:  Evaluate mass seen on abdominal CT EXAM: CT CHEST WITH CONTRAST TECHNIQUE: Multidetector CT imaging of the chest was performed during intravenous contrast administration. CONTRAST:  4m OMNIPAQUE IOHEXOL 300 MG/ML  SOLN COMPARISON:  11/25/2014 FINDINGS: THORACIC INLET/BODY WALL: No axillary or thoracic inlet adenopathy. MEDIASTINUM: Mild cardiomegaly. No pericardial effusion. Atherosclerosis, including the coronary arteries. No acute vascular finding. Mediastinal and hilar lymphadenopathy with progressive enlargement since prior, especially of the subcarinal and right periesophageal nodes. For example, the subcarinal node now measures 3 cm short axis on axial imaging. LUNG WINDOWS: New 16 mm (measured craniocaudal) nodule in the right lower lobe with probable satellite 7 mm nodule. Much of the previously seen lung opacity has resolved, with residual patchy ground-glass density in the apical lungs favoring scar. Linear scarring present in the lingula. Moderate patchy air trapping. UPPER ABDOMEN: No acute findings. OSSEOUS: No acute fracture.  No suspicious lytic or blastic lesions. IMPRESSION: Progressive mediastinal and hilar  adenopathy with new 16 mm right lower lobe pulmonary nodule. Lymphoma or bronchogenic carcinoma is the primary diagnostic concern and histologic correlation is recommended, preferably guided by PET-CT. Electronically Signed   By: JMonte FantasiaM.D.   On: 06/19/2015 00:44   Mr BJeri CosWMVContrast  06/26/2015  CLINICAL DATA:  History of small cell lung cancer. History of diabetes, cocaine abuse, abscess, septic shock. EXAM: MRI HEAD WITHOUT AND WITH CONTRAST TECHNIQUE: Multiplanar, multiecho pulse sequences of the brain and surrounding structures were obtained without and with intravenous contrast. CONTRAST:  287mMULTIHANCE GADOBENATE DIMEGLUMINE 529 MG/ML IV SOLN COMPARISON:  CT head June 06, 2015 FINDINGS: Mild motion degraded examination. The ventricles and sulci are normal for patient's age. No abnormal parenchymal signal, mass lesions, mass effect. Patchy supratentorial white matter T2 hyperintensities. No abnormal parenchymal enhancement. No reduced diffusion to suggest acute ischemia. No susceptibility artifact to suggest hemorrhage. No abnormal extra-axial fluid collections. No extra-axial masses nor leptomeningeal enhancement. Normal major intracranial vascular flow voids seen at the skull base. Ocular globes and orbital contents are unremarkable though not tailored for evaluation. No suspicious calvarial bone marrow signal. No abnormal sellar expansion. Craniocervical junction maintained. LEFT maxillary sinus air-fluid level. Small LEFT mastoid effusion. IMPRESSION: Mild motion degraded examination without acute intracranial process nor MR findings of intracranial metastasis. Mild to moderate white matter changes most often seen with chronic small vessel ischemic disease. Electronically Signed   By: CoElon Alas.D.   On: 06/26/2015 22:09   Nm Pet Image Initial (pi) Skull Base To Thigh  07/12/2015  CLINICAL DATA:  Initial treatment strategy for newly diagnosed small-cell lung carcinoma  based on EBUS FNA of a subcarinal lymph node on 06/21/2015. EXAM: NUCLEAR MEDICINE PET SKULL BASE TO THIGH TECHNIQUE: 12.3 mCi F-18 FDG was injected intravenously. Full-ring PET imaging was performed from the skull base to thigh after the radiotracer. CT data was obtained and used for attenuation correction and anatomic localization. FASTING BLOOD GLUCOSE:  Value: 224 mg/dl COMPARISON:  06/19/2015 chest CT.  06/17/2015 CT abdomen/ pelvis. FINDINGS: NECK No hypermetabolic lymph nodes in the neck. Mucous retention cyst versus polyp in the inferior posterior left maxillary sinus. CHEST There is a hypermetabolic 2.2 x 2.1 cm posterior right lower lobe pulmonary nodule (series 3/image 118) with max SUV 4.0, increased in size from 1.5 x 1.3 cm on 06/19/2015. There is a suggestion of  a new 0.6 cm anterior right lower lobe pulmonary nodule (3/123), below PET resolution. No acute consolidative airspace disease. Mosaic attenuation throughout the lungs. There are multiple hypermetabolic ipsilateral right peribronchial nodes, largest 2.0 cm (series 3/image 90) with max SUV 10.2. There is bulky subcarinal hypermetabolic adenopathy measuring 3.6 cm (series 3/image 93) with max SUV 11.2. There is a mildly hypermetabolic and mildly enlarged right lower paratracheal 1.1 cm node (series 3/image 87) with max SUV 4.3. No contralateral mediastinal, contralateral hilar or supraclavicular hypermetabolic adenopathy. No hypermetabolic axillary nodes. Left anterior descending and right coronary atherosclerosis. Mitral annular calcifications. ABDOMEN/PELVIS No abnormal hypermetabolic activity within the liver, pancreas, adrenal glands, or spleen. No hypermetabolic lymph nodes in the abdomen or pelvis. Mild diffuse hepatic steatosis. Status post cholecystectomy. Two clustered 3 mm nonobstructing stones in the lower left kidney. Malrotated left kidney. Mild fullness of the left renal collecting system without overt hydronephrosis. SKELETON No  focal hypermetabolic activity to suggest skeletal metastasis. IMPRESSION: 1. Hypermetabolic 2.2 cm right lower lobe pulmonary nodule, in keeping with a primary bronchogenic carcinoma, which has grown since 06/19/2015. 2. Hypermetabolic ipsilateral peribronchial, subcarinal and ipsilateral paratracheal lymphadenopathy. 3. Suggestion of a new 6 mm right lower lobe pulmonary nodule, below PET resolution, suspect an ipsilateral primary tumor lobe pulmonary metastasis. 4. No extrathoracic hypermetabolic metastatic disease. 5. TNM Stage IIIa by PET-CT (T3 due to suspected ipsilateral primary tumor lobe metastasis N2 M0). 6. Additional findings including 2 vessel coronary atherosclerosis, mild diffuse hepatic steatosis and nonobstructing tiny left renal stones. Electronically Signed   By: Ilona Sorrel M.D.   On: 07/12/2015 10:56   Dg Chest Port 1 View  06/21/2015  CLINICAL DATA:  54 year old with progressive mediastinal and right hilar lymphadenopathy on recent imaging. Post bronchoscopic mediastinal lymph node biopsy. EXAM: PORTABLE CHEST 1 VIEW COMPARISON:  CT chest 06/19/2015 and earlier. Chest x-ray 06/05/2015 and earlier. FINDINGS: Markedly suboptimal inspiration accounts for atelectasis in the lung bases, left greater than right. No evidence of pneumothorax, pneumomediastinum or mediastinal hematoma. Oval-shaped opacity in the right upper lobe, new since the CT 2 days ago. Heart enlarged but stable. Pulmonary venous hypertension without overt edema. IMPRESSION: 1. No evidence of pneumothorax or pneumomediastinum post bronchoscopy. 2. Markedly suboptimal inspiration accounts for bibasilar atelectasis, left greater than right. 3. Right upper lobe opacity which may represent a small focus of hemorrhage related to the bronchoscopy or may indicate atelectasis. Electronically Signed   By: Evangeline Dakin M.D.   On: 06/21/2015 16:36    Labs:  CBC:  Recent Labs  06/17/15 0200 07/03/15 1334 07/10/15 1101  07/17/15 1225  WBC 9.6 5.9 5.7 7.1  HGB 11.8* 11.6 12.6 11.9*  HCT 32.7* 34.3* 36.8 33.4*  PLT 345 296 306 275    COAGS:  Recent Labs  11/25/14 0300 06/17/15 0650  INR 1.09 1.05  APTT 28 32    BMP:  Recent Labs  06/18/15 1325 06/18/15 2020 06/19/15 0557 06/20/15 0340 07/03/15 1334 07/10/15 1102  NA 125* 129* 131* 132* 121* 118*  K 5.2* 4.5 4.0 3.7 3.2* 3.2*  CL 88* 89* 92* 93*  --   --   CO2 21* '27 27 28 23 26  '$ GLUCOSE 335* 268* 213* 173* 217* 171*  BUN '7 12 10 12 '$ 5.2* 6.1*  CALCIUM 10.0 10.5* 10.3 10.1 9.4 9.5  CREATININE 0.73 0.70 0.59 0.66 0.7 0.6  GFRNONAA >60 >60 >60 >60  --   --   GFRAA >60 >60 >60 >60  --   --  LIVER FUNCTION TESTS:  Recent Labs  06/06/15 1105 06/17/15 0200 07/03/15 1334 07/10/15 1102  BILITOT 0.6 0.6 <0.30 0.39  AST '30 31 16 21  '$ ALT '15 19 16 15  '$ ALKPHOS 91 98 96 94  PROT 5.9* 7.2 6.3* 6.9  ALBUMIN 3.2* 3.7 3.6 3.7    TUMOR MARKERS: No results for input(s): AFPTM, CEA, CA199, CHROMGRNA in the last 8760 hours.  Assessment and Plan: Patient with history of newly diagnosed limited stage small cell cancer of the right lung; poor venous access. Plan is for Port-A-Cath placement today for chemotherapy .Risks and benefits discussed with the patient including, but not limited to bleeding, infection, pneumothorax, or fibrin sheath development and need for additional procedures. All of the patient's questions were answered, patient is agreeable to proceed. Consent signed and in chart.Labs pending.      Thank you for this interesting consult.  I greatly enjoyed meeting Sarah Leonard and look forward to participating in their care.  A copy of this report was sent to the requesting provider on this date.  Signed: D. Rowe Robert 07/17/2015, 12:48 PM   I spent a total of 15 minutes in face to face in clinical consultation, greater than 50% of which was counseling/coordinating care for port a cath placement

## 2015-07-18 ENCOUNTER — Telehealth: Payer: Self-pay | Admitting: *Deleted

## 2015-07-18 ENCOUNTER — Telehealth: Payer: Self-pay | Admitting: Internal Medicine

## 2015-07-18 ENCOUNTER — Telehealth: Payer: Self-pay | Admitting: Physician Assistant

## 2015-07-18 ENCOUNTER — Other Ambulatory Visit (HOSPITAL_BASED_OUTPATIENT_CLINIC_OR_DEPARTMENT_OTHER): Payer: Medicaid Other

## 2015-07-18 ENCOUNTER — Other Ambulatory Visit: Payer: Self-pay | Admitting: Physician Assistant

## 2015-07-18 ENCOUNTER — Ambulatory Visit (HOSPITAL_BASED_OUTPATIENT_CLINIC_OR_DEPARTMENT_OTHER): Payer: Medicaid Other | Admitting: Physician Assistant

## 2015-07-18 VITALS — BP 152/76 | HR 91 | Temp 98.7°F | Resp 18 | Ht 63.0 in | Wt 221.1 lb

## 2015-07-18 DIAGNOSIS — C3491 Malignant neoplasm of unspecified part of right bronchus or lung: Secondary | ICD-10-CM | POA: Diagnosis not present

## 2015-07-18 DIAGNOSIS — C3431 Malignant neoplasm of lower lobe, right bronchus or lung: Secondary | ICD-10-CM

## 2015-07-18 LAB — COMPREHENSIVE METABOLIC PANEL
ALBUMIN: 3.3 g/dL — AB (ref 3.5–5.0)
ALK PHOS: 97 U/L (ref 40–150)
ALT: 13 U/L (ref 0–55)
AST: 19 U/L (ref 5–34)
Anion Gap: 16 mEq/L — ABNORMAL HIGH (ref 3–11)
BUN: 4.1 mg/dL — ABNORMAL LOW (ref 7.0–26.0)
CALCIUM: 9 mg/dL (ref 8.4–10.4)
CO2: 24 mEq/L (ref 22–29)
Chloride: 81 mEq/L — ABNORMAL LOW (ref 98–109)
Creatinine: 0.7 mg/dL (ref 0.6–1.1)
Glucose: 215 mg/dl — ABNORMAL HIGH (ref 70–140)
POTASSIUM: 2.7 meq/L — AB (ref 3.5–5.1)
Sodium: 121 mEq/L — ABNORMAL LOW (ref 136–145)
Total Bilirubin: 0.34 mg/dL (ref 0.20–1.20)
Total Protein: 6.7 g/dL (ref 6.4–8.3)

## 2015-07-18 LAB — CBC WITH DIFFERENTIAL/PLATELET
BASO%: 0.3 % (ref 0.0–2.0)
BASOS ABS: 0 10*3/uL (ref 0.0–0.1)
EOS ABS: 0.1 10*3/uL (ref 0.0–0.5)
EOS%: 1.1 % (ref 0.0–7.0)
HEMATOCRIT: 34.7 % — AB (ref 34.8–46.6)
HEMOGLOBIN: 12 g/dL (ref 11.6–15.9)
LYMPH#: 1.5 10*3/uL (ref 0.9–3.3)
LYMPH%: 27.1 % (ref 14.0–49.7)
MCH: 29.8 pg (ref 25.1–34.0)
MCHC: 34.7 g/dL (ref 31.5–36.0)
MCV: 85.9 fL (ref 79.5–101.0)
MONO#: 0.4 10*3/uL (ref 0.1–0.9)
MONO%: 7.3 % (ref 0.0–14.0)
NEUT#: 3.6 10*3/uL (ref 1.5–6.5)
NEUT%: 64.2 % (ref 38.4–76.8)
Platelets: 277 10*3/uL (ref 145–400)
RBC: 4.04 10*6/uL (ref 3.70–5.45)
RDW: 14.3 % (ref 11.2–14.5)
WBC: 5.6 10*3/uL (ref 3.9–10.3)

## 2015-07-18 MED ORDER — POTASSIUM CHLORIDE ER 10 MEQ PO TBCR: 20.0000 meq | EXTENDED_RELEASE_TABLET | Freq: Every day | ORAL | Status: DC

## 2015-07-18 MED ORDER — LIDOCAINE-PRILOCAINE 2.5-2.5 % EX CREA: 1.0000 "application " | TOPICAL_CREAM | CUTANEOUS | Status: DC | PRN

## 2015-07-18 NOTE — Telephone Encounter (Signed)
per pof to sch pt appt-sch and sent MW email to sch trmt-pt to get updated copy on 12/20 per pot req

## 2015-07-18 NOTE — Telephone Encounter (Signed)
Per staff message and POF I have scheduled appts. Advised scheduler of appts. JMW  

## 2015-07-18 NOTE — Telephone Encounter (Signed)
cld pt and left next appt 12/14 time & date

## 2015-07-18 NOTE — Progress Notes (Signed)
Hematology and Oncology Follow Up Visit  Sarah Leonard [id number] [birth year] 54 y.o. 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  PET scan on 12/1 showed Hypermetabolic 2.2 cm right lower lobe pulmonary nodule, in keeping with a primary bronchogenic carcinoma, which has grown [id number],  Hypermetabolic ipsilateral peribronchial, subcarinal and ipsilateral paratracheal lymphadenopathy and a  suggestion of a new 6 mm right lower lobe pulmonary nodule  suspect an ipsilateral primary tumor lobe pulmonarymetastasis.  No extrathoracic hypermetabolic metastatic disease. She is nowLimited Stage III A   Prior therapy: None   Current therapy: Concurrent chemoradiation with systemic chemotherapy in the form of carboplatin for AUC of 5 on day 1 and etoposide 120 MG/M2 on days 1, 2 and 3 with Neulasta support on day 4. The patient will be treated for a total of 4-6 cycles of this regimen. D1C1 on 07/31/15  Interim History:  PAtient returns for follow up prior to her treatment planned for next week. She is to start radiation on 12/14. She feels tired, and has intermittent headaches. Of note, her MRI brain was negative for metastatic disease.  Denies fevers, chills, night sweats, vision changes, or mucositis. Denies any respiratory complaints. Denies any chest pain or palpitations. Denies lower extremity swelling. Denies nausea, heartburn or change in bowel habits. Appetite is normal. Denies any dysuria. Denies abnormal skin rashes, or neuropathy. Denies any bleeding issues such as epistaxis, hematemesis, hematuria or hematochezia. Ambulating with assistance   Medications:  Reviewed and updated.   Allergies: No Known Allergies  Past  Medical History, Surgical history, Social history, and Family History were reviewed and updated.  Review of Systems: ROS See HPI for significant positives Remaining ROS negative.  Physical Exam: Blood pressure 152/76, pulse 91, temperature 98.7 F (37.1 C), temperature source Oral, resp. rate 18, height '5\' 3"'$  (1.6 m), weight 221 lb 1.6 oz (100.29 kg), SpO2 96 %.  Physical Exam  Constitutional: She is oriented to person, place, and time and well-developed, well-nourished, and in no distress.  HENT:  Head: Normocephalic and atraumatic.  Mouth/Throat: Oropharynx is clear and moist. No oropharyngeal exudate.  Eyes: EOM are normal. Pupils are equal, round, and reactive to light. Right eye exhibits no discharge. No scleral icterus.  Neck: Normal range of motion. Neck supple.  Cardiovascular: Normal rate, regular rhythm and normal heart sounds.  Exam reveals no gallop and no friction rub.   No murmur heard. Pulmonary/Chest: Effort normal and breath sounds normal.  Abdominal: Soft. Bowel sounds are normal.  Musculoskeletal: Normal range of motion.  Lymphadenopathy:    She has no cervical adenopathy.  Neurological: She is alert and oriented to person, place, and time.  Skin: Skin is dry. No rash noted. No erythema. No pallor.  Psychiatric: Mood, memory, affect and judgment normal.    LABS: CBC Latest Ref Rng 07/18/2015 07/17/2015 07/10/2015  WBC 3.9 - 10.3 10e3/uL 5.6 7.1 5.7  Hemoglobin 11.6 - 15.9 g/dL 12.0 11.9(L) 12.6  Hematocrit 34.8 - 46.6 % 34.7(L) 33.4(L) 36.8  Platelets 145 - 400 10e3/uL 277 275 306   CMP Latest Ref Rng 07/17/2015 07/10/2015 07/03/2015  Glucose 65 - 99 mg/dL 182(H) 171(H) 217(H)  BUN 6 - 20 mg/dL 6 6.1(L) 5.2(L)  Creatinine 0.44 - 1.00 mg/dL 0.42(L) 0.6  0.7  Sodium 135 - 145 mmol/L 123(L) 118(L) 121(L)  Potassium 3.5 - 5.1 mmol/L 3.1(L) 3.2(L) 3.2(L)  Chloride 101 - 111 mmol/L 81(L) - -  CO2 22 - 32 mmol/L '29 26 23  '$ Calcium 8.9 - 10.3 mg/dL 9.1 9.5 9.4   Total Protein 6.4 - 8.3 g/dL - 6.9 6.3(L)  Total Bilirubin 0.20 - 1.20 mg/dL - 0.39 <0.30  Alkaline Phos 40 - 150 U/L - 94 96  AST 5 - 34 U/L - 21 16  ALT 0 - 55 U/L - 15 16    Radiological Studies: Ct Chest W Contrast  06/19/2015  CLINICAL DATA:  Evaluate mass seen on abdominal CT EXAM: CT CHEST WITH CONTRAST TECHNIQUE: Multidetector CT imaging of the chest was performed during intravenous contrast administration. CONTRAST:  50m OMNIPAQUE IOHEXOL 300 MG/ML  SOLN COMPARISON:  11/25/2014 FINDINGS: THORACIC INLET/BODY WALL: No axillary or thoracic inlet adenopathy. MEDIASTINUM: Mild cardiomegaly. No pericardial effusion. Atherosclerosis, including the coronary arteries. No acute vascular finding. Mediastinal and hilar lymphadenopathy with progressive enlargement since prior, especially of the subcarinal and right periesophageal nodes. For example, the subcarinal node now measures 3 cm short axis on axial imaging. LUNG WINDOWS: New 16 mm (measured craniocaudal) nodule in the right lower lobe with probable satellite 7 mm nodule. Much of the previously seen lung opacity has resolved, with residual patchy ground-glass density in the apical lungs favoring scar. Linear scarring present in the lingula. Moderate patchy air trapping. UPPER ABDOMEN: No acute findings. OSSEOUS: No acute fracture.  No suspicious lytic or blastic lesions. IMPRESSION: Progressive mediastinal and hilar adenopathy with new 16 mm right lower lobe pulmonary nodule. Lymphoma or bronchogenic carcinoma is the primary diagnostic concern and histologic correlation is recommended, preferably guided by PET-CT. Electronically Signed   By: JMonte FantasiaM.D.   On: 06/19/2015 00:44   Mr BJeri CosWHBContrast  06/26/2015  CLINICAL DATA:  History of small cell lung cancer. History of diabetes, cocaine abuse, abscess, septic shock. EXAM: MRI HEAD WITHOUT AND WITH CONTRAST TECHNIQUE: Multiplanar, multiecho pulse sequences of the brain and surrounding  structures were obtained without and with intravenous contrast. CONTRAST:  239mMULTIHANCE GADOBENATE DIMEGLUMINE 529 MG/ML IV SOLN COMPARISON:  CT head June 06, 2015 FINDINGS: Mild motion degraded examination. The ventricles and sulci are normal for patient's age. No abnormal parenchymal signal, mass lesions, mass effect. Patchy supratentorial white matter T2 hyperintensities. No abnormal parenchymal enhancement. No reduced diffusion to suggest acute ischemia. No susceptibility artifact to suggest hemorrhage. No abnormal extra-axial fluid collections. No extra-axial masses nor leptomeningeal enhancement. Normal major intracranial vascular flow voids seen at the skull base. Ocular globes and orbital contents are unremarkable though not tailored for evaluation. No suspicious calvarial bone marrow signal. No abnormal sellar expansion. Craniocervical junction maintained. LEFT maxillary sinus air-fluid level. Small LEFT mastoid effusion. IMPRESSION: Mild motion degraded examination without acute intracranial process nor MR findings of intracranial metastasis. Mild to moderate white matter changes most often seen with chronic small vessel ischemic disease. Electronically Signed   By: CoElon Alas.D.   On: 06/26/2015 22:09   Nm Pet Image Initial (pi) Skull Base To Thigh  07/12/2015  CLINICAL DATA:  Initial treatment strategy for newly diagnosed small-cell lung carcinoma based on EBUS FNA of a subcarinal lymph node on 06/21/2015. EXAM: NUCLEAR MEDICINE PET SKULL BASE TO THIGH TECHNIQUE: 12.3 mCi F-18 FDG was injected intravenously. Full-ring PET imaging was performed from the skull base to thigh after the radiotracer. CT data was obtained  and used for attenuation correction and anatomic localization. FASTING BLOOD GLUCOSE:  Value: 224 mg/dl COMPARISON:  06/19/2015 chest CT.  06/17/2015 CT abdomen/ pelvis. FINDINGS: NECK No hypermetabolic lymph nodes in the neck. Mucous retention cyst versus polyp in the  inferior posterior left maxillary sinus. CHEST There is a hypermetabolic 2.2 x 2.1 cm posterior right lower lobe pulmonary nodule (series 3/image 118) with max SUV 4.0, increased in size from 1.5 x 1.3 cm on 06/19/2015. There is a suggestion of a new 0.6 cm anterior right lower lobe pulmonary nodule (3/123), below PET resolution. No acute consolidative airspace disease. Mosaic attenuation throughout the lungs. There are multiple hypermetabolic ipsilateral right peribronchial nodes, largest 2.0 cm (series 3/image 90) with max SUV 10.2. There is bulky subcarinal hypermetabolic adenopathy measuring 3.6 cm (series 3/image 93) with max SUV 11.2. There is a mildly hypermetabolic and mildly enlarged right lower paratracheal 1.1 cm node (series 3/image 87) with max SUV 4.3. No contralateral mediastinal, contralateral hilar or supraclavicular hypermetabolic adenopathy. No hypermetabolic axillary nodes. Left anterior descending and right coronary atherosclerosis. Mitral annular calcifications. ABDOMEN/PELVIS No abnormal hypermetabolic activity within the liver, pancreas, adrenal glands, or spleen. No hypermetabolic lymph nodes in the abdomen or pelvis. Mild diffuse hepatic steatosis. Status post cholecystectomy. Two clustered 3 mm nonobstructing stones in the lower left kidney. Malrotated left kidney. Mild fullness of the left renal collecting system without overt hydronephrosis. SKELETON No focal hypermetabolic activity to suggest skeletal metastasis. IMPRESSION: 1. Hypermetabolic 2.2 cm right lower lobe pulmonary nodule, in keeping with a primary bronchogenic carcinoma, which has grown since 06/19/2015. 2. Hypermetabolic ipsilateral peribronchial, subcarinal and ipsilateral paratracheal lymphadenopathy. 3. Suggestion of a new 6 mm right lower lobe pulmonary nodule, below PET resolution, suspect an ipsilateral primary tumor lobe pulmonary metastasis. 4. No extrathoracic hypermetabolic metastatic disease. 5. TNM Stage IIIa by  PET-CT (T3 due to suspected ipsilateral primary tumor lobe metastasis N2 M0). 6. Additional findings including 2 vessel coronary atherosclerosis, mild diffuse hepatic steatosis and nonobstructing tiny left renal stones. Electronically Signed   By: Ilona Sorrel M.D.   On: 07/12/2015 10:56   Ir Fluoro Guide Cv Line Right  07/18/2015  CLINICAL DATA:  Lung carcinoma, needs venous access for chemotherapy EXAM: TUNNELED PORT CATHETER PLACEMENT WITH ULTRASOUND AND FLUOROSCOPIC GUIDANCE FLUOROSCOPY TIME:  0.4 minutes, 286  uGym2 DAP ANESTHESIA/SEDATION: Intravenous Fentanyl and Versed were administered as conscious sedation during continuous cardiorespiratory monitoring by the radiology RN, with a total moderate sedation time of 17 minutes. TECHNIQUE: The procedure, risks, benefits, and alternatives were explained to the patient. Questions regarding the procedure were encouraged and answered. The patient understands and consents to the procedure. As antibiotic prophylaxis, cefazolin 2 g was ordered pre-procedure and administered intravenously within one hour of incision. Patency of the right IJ vein was confirmed with ultrasound with image documentation. An appropriate skin site was determined. Skin site was marked. Region was prepped using maximum barrier technique including cap and mask, sterile gown, sterile gloves, large sterile sheet, and Chlorhexidine as cutaneous antisepsis. The region was infiltrated locally with 1% lidocaine.Under real-time ultrasound guidance, the right IJ vein was accessed with a 21 gauge micropuncture needle; the needle tip within the vein was confirmed with ultrasound image documentation. Needle was exchanged over a 018 guidewire for transitional dilator which allowed passage of the [facility] wire into the IVC. Over this, the transitional dilator was exchanged for a 5 [place] MPA catheter. A small incision was made on the right anterior chest wall and a subcutaneous pocket  fashioned. The  power-injectable port was positioned and its catheter tunneled to the right IJ dermatotomy site. The MPA catheter was exchanged over an Amplatz wire for a peel-away sheath, through which the port catheter, which had been trimmed to the appropriate length, was advanced and positioned under fluoroscopy with its tip at the cavoatrial junction. Spot chest radiograph confirms good catheter position and no pneumothorax. The pocket was closed with deep interrupted and subcuticular continuous 3-0 Monocryl sutures. The port was flushed per protocol. The incisions were covered with Dermabond then covered with a sterile dressing. COMPLICATIONS: COMPLICATIONS None immediate IMPRESSION: Technically successful right IJ power-injectable port catheter placement. Ready for routine use. Electronically Signed   By: Lucrezia Europe M.D.   On: 07/18/2015 09:29   Ir US Guide Vasc Access Right  07/18/2015  CLINICAL DATA:  Lung carcinoma, needs venous access for chemotherapy EXAM: TUNNELED PORT CATHETER PLACEMENT WITH ULTRASOUND AND FLUOROSCOPIC GUIDANCE FLUOROSCOPY TIME:  0.4 minutes, 286  uGym2 DAP ANESTHESIA/SEDATION: Intravenous Fentanyl and Versed were administered as conscious sedation during continuous cardiorespiratory monitoring by the radiology RN, with a total moderate sedation time of 17 minutes. TECHNIQUE: The procedure, risks, benefits, and alternatives were explained to the patient. Questions regarding the procedure were encouraged and answered. The patient understands and consents to the procedure. As antibiotic prophylaxis, cefazolin 2 g was ordered pre-procedure and administered intravenously within one hour of incision. Patency of the right IJ vein was confirmed with ultrasound with image documentation. An appropriate skin site was determined. Skin site was marked. Region was prepped using maximum barrier technique including cap and mask, sterile gown, sterile gloves, large sterile sheet, and Chlorhexidine as cutaneous  antisepsis. The region was infiltrated locally with 1% lidocaine.Under real-time ultrasound guidance, the right IJ vein was accessed with a 21 gauge micropuncture needle; the needle tip within the vein was confirmed with ultrasound image documentation. Needle was exchanged over a 018 guidewire for transitional dilator which allowed passage of the [facility] wire into the IVC. Over this, the transitional dilator was exchanged for a 5 [place] MPA catheter. A small incision was made on the right anterior chest wall and a subcutaneous pocket fashioned. The power-injectable port was positioned and its catheter tunneled to the right IJ dermatotomy site. The MPA catheter was exchanged over an Amplatz wire for a peel-away sheath, through which the port catheter, which had been trimmed to the appropriate length, was advanced and positioned under fluoroscopy with its tip at the cavoatrial junction. Spot chest radiograph confirms good catheter position and no pneumothorax. The pocket was closed with deep interrupted and subcuticular continuous 3-0 Monocryl sutures. The port was flushed per protocol. The incisions were covered with Dermabond then covered with a sterile dressing. COMPLICATIONS: COMPLICATIONS None immediate IMPRESSION: Technically successful right IJ power-injectable port catheter placement. Ready for routine use. Electronically Signed   By: Lucrezia Europe M.D.   On: 07/18/2015 09:29   Dg Chest Port 1 View  06/21/2015  CLINICAL DATA:  54 year old with progressive mediastinal and right hilar lymphadenopathy on recent imaging. Post bronchoscopic mediastinal lymph node biopsy. EXAM: PORTABLE CHEST 1 VIEW COMPARISON:  CT chest 06/19/2015 and earlier. Chest x-ray 06/05/2015 and earlier. FINDINGS: Markedly suboptimal inspiration accounts for atelectasis in the lung bases, left greater than right. No evidence of pneumothorax, pneumomediastinum or mediastinal hematoma. Oval-shaped opacity in the right upper lobe, new since  the CT 2 days ago. Heart enlarged but stable. Pulmonary venous hypertension without overt edema. IMPRESSION: 1. No evidence of pneumothorax or pneumomediastinum  post bronchoscopy. 2. Markedly suboptimal inspiration accounts for bibasilar atelectasis, left greater than right. 3. Right upper lobe opacity which may represent a small focus of hemorrhage related to the bronchoscopy or may indicate atelectasis. Electronically Signed   By: Evangeline Dakin M.D.   On: 06/21/2015 16:36    Impression and Plan: This is a very pleasant 54 years old white female recently diagnosed initially limited stage (T1a, N2, M0) small cell lung cancer presented with right lower lobe pulmonary nodule in addition to mediastinal lymphadenopathy, diagnosed in November 2016 pending further staging workup with a PET scan.  PET scan on 12/1 showed Hypermetabolic 2.2 cm right lower lobe pulmonary nodule, in keeping with a primary bronchogenic carcinoma, which has grown [id number],  Hypermetabolic ipsilateral peribronchial, subcarinal and ipsilateral paratracheal lymphadenopathy and a  suggestion of a new 6 mm right lower lobe pulmonary nodule  suspect an ipsilateral primary tumor lobe pulmonarymetastasis.  No extrathoracic hypermetabolic metastatic disease. She is now limited Stage III A Recommended for the patient a course of concurrent chemoradiation with systemic chemotherapy in the form of carboplatin for AUC of 5 on day 1 and etoposide 120 MG/M2 on days 1, 2 and 3 with Neulasta support on day 4. The patient will be treated for a total of 4-6 cycles of this regimen. She is expected to receive concurrent radiotherapy during the first 2 cycles of her treatment. Treatment will be started on 12/14 Discussed with the patient adverse effect of the chemotherapy including but not limited to alopecia, myelosuppression, nausea and vomiting, peripheral neuropathy, liver or renal dysfunction. I made the decision not to cisplatin in this  patient because of her history of diabetes mellitus in addition to concern about delayed nausea.  She received chemotherapy education class before the first cycle of her chemotherapy. The patient is expected to start the first cycle of her systemic chemotherapy on 12/14 For the headache she was given  Percocet 5/325 mg by mouth every 8 hours as needed during initial visit. For nausea, she is to take Compazine 10 mg by mouth every 6 hours as needed for nausea. 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 She was advised to increase her salt intake during this time.  For her hypokalemia, will prescribe K dur 20 meq x 7 days and will recheck prior to cycle 1 treatment The patient would come back for follow-up visit in 2 weeks for reevaluation and management of any adverse effect of her treatment. She was advised to call immediately if she has any concerning symptoms in the interval. The patient voices understanding of current disease status and treatment options and is in agreement with the current care plan.  All questions were answered. The patient knows to call the clinic with any problems, questions or concerns. We can certainly see the patient much sooner if necessary.   Rondel Jumbo, PA-C 12/7/201611:14 AM  ADDENDUM: Hematology/Oncology Attending: I had a face to face encounter with the patient. I recommended her care plan. This is a very pleasant 54 years old white female recently diagnosed with limited stage small cell lung cancer. The patient completed the staging workup including MRI of the brain that showed no evidence for brain metastasis. She also had a PET scan that showed no evidence for metastatic disease outside the known right lower lobe lung nodule and mediastinal lymphadenopathy. She had a Port-A-Cath placed before  chemotherapy infusion. I  recommended for the patient to proceed with her systemic chemotherapy with carboplatin and etoposide as a scheduled. This will be concurrent with radiotherapy. We try to move her chemotherapy to neck week but unfortunately because of transportation issues and unavailability of family members her treatment well continue on December 20 as previously scheduled. The patient would come back for follow-up visit one week after her first cycle of the chemotherapy for reevaluation and management of any adverse effect of her treatment. The patient was advised to call immediately if she has any concerning symptoms in the interval.  Disclaimer: This note was dictated with voice recognition software. Similar sounding words can inadvertently be transcribed and may be missed upon review. Eilleen Kempf., MD 07/21/2015

## 2015-07-22 ENCOUNTER — Encounter (HOSPITAL_COMMUNITY): Payer: Self-pay

## 2015-07-22 ENCOUNTER — Emergency Department (HOSPITAL_COMMUNITY): Payer: Medicaid Other

## 2015-07-22 ENCOUNTER — Inpatient Hospital Stay (HOSPITAL_COMMUNITY)
Admission: EM | Admit: 2015-07-22 | Discharge: 2015-07-25 | DRG: 392 | Disposition: A | Discharge status: Home / Self Care | Payer: Medicaid Other | Attending: Internal Medicine | Admitting: Internal Medicine

## 2015-07-22 DIAGNOSIS — E222 Syndrome of inappropriate secretion of antidiuretic hormone: Secondary | ICD-10-CM | POA: Diagnosis present

## 2015-07-22 DIAGNOSIS — G8929 Other chronic pain: Secondary | ICD-10-CM | POA: Diagnosis present

## 2015-07-22 DIAGNOSIS — R51 Headache: Secondary | ICD-10-CM | POA: Diagnosis not present

## 2015-07-22 DIAGNOSIS — Z85118 Personal history of other malignant neoplasm of bronchus and lung: Secondary | ICD-10-CM

## 2015-07-22 DIAGNOSIS — E869 Volume depletion, unspecified: Secondary | ICD-10-CM | POA: Diagnosis present

## 2015-07-22 DIAGNOSIS — E785 Hyperlipidemia, unspecified: Secondary | ICD-10-CM | POA: Diagnosis present

## 2015-07-22 DIAGNOSIS — M549 Dorsalgia, unspecified: Secondary | ICD-10-CM | POA: Diagnosis present

## 2015-07-22 DIAGNOSIS — F17200 Nicotine dependence, unspecified, uncomplicated: Secondary | ICD-10-CM | POA: Diagnosis present

## 2015-07-22 DIAGNOSIS — Z794 Long term (current) use of insulin: Secondary | ICD-10-CM

## 2015-07-22 DIAGNOSIS — R519 Headache, unspecified: Secondary | ICD-10-CM | POA: Diagnosis present

## 2015-07-22 DIAGNOSIS — Z9911 Dependence on respirator [ventilator] status: Secondary | ICD-10-CM

## 2015-07-22 DIAGNOSIS — J449 Chronic obstructive pulmonary disease, unspecified: Secondary | ICD-10-CM | POA: Diagnosis present

## 2015-07-22 DIAGNOSIS — W19XXXA Unspecified fall, initial encounter: Secondary | ICD-10-CM

## 2015-07-22 DIAGNOSIS — E876 Hypokalemia: Secondary | ICD-10-CM | POA: Diagnosis not present

## 2015-07-22 DIAGNOSIS — C3490 Malignant neoplasm of unspecified part of unspecified bronchus or lung: Secondary | ICD-10-CM

## 2015-07-22 DIAGNOSIS — R590 Localized enlarged lymph nodes: Secondary | ICD-10-CM | POA: Diagnosis present

## 2015-07-22 DIAGNOSIS — F319 Bipolar disorder, unspecified: Secondary | ICD-10-CM | POA: Diagnosis present

## 2015-07-22 DIAGNOSIS — Z809 Family history of malignant neoplasm, unspecified: Secondary | ICD-10-CM

## 2015-07-22 DIAGNOSIS — C3491 Malignant neoplasm of unspecified part of right bronchus or lung: Secondary | ICD-10-CM | POA: Diagnosis present

## 2015-07-22 DIAGNOSIS — Z7982 Long term (current) use of aspirin: Secondary | ICD-10-CM

## 2015-07-22 DIAGNOSIS — I10 Essential (primary) hypertension: Secondary | ICD-10-CM | POA: Diagnosis present

## 2015-07-22 DIAGNOSIS — Z9049 Acquired absence of other specified parts of digestive tract: Secondary | ICD-10-CM

## 2015-07-22 DIAGNOSIS — F1410 Cocaine abuse, uncomplicated: Secondary | ICD-10-CM | POA: Diagnosis present

## 2015-07-22 DIAGNOSIS — C3431 Malignant neoplasm of lower lobe, right bronchus or lung: Secondary | ICD-10-CM | POA: Diagnosis present

## 2015-07-22 DIAGNOSIS — R1110 Vomiting, unspecified: Secondary | ICD-10-CM

## 2015-07-22 DIAGNOSIS — E1165 Type 2 diabetes mellitus with hyperglycemia: Secondary | ICD-10-CM | POA: Diagnosis present

## 2015-07-22 DIAGNOSIS — Z9981 Dependence on supplemental oxygen: Secondary | ICD-10-CM

## 2015-07-22 DIAGNOSIS — E669 Obesity, unspecified: Secondary | ICD-10-CM | POA: Diagnosis present

## 2015-07-22 DIAGNOSIS — F1010 Alcohol abuse, uncomplicated: Secondary | ICD-10-CM | POA: Diagnosis present

## 2015-07-22 DIAGNOSIS — Z7984 Long term (current) use of oral hypoglycemic drugs: Secondary | ICD-10-CM

## 2015-07-22 DIAGNOSIS — E860 Dehydration: Secondary | ICD-10-CM | POA: Diagnosis present

## 2015-07-22 DIAGNOSIS — Z833 Family history of diabetes mellitus: Secondary | ICD-10-CM

## 2015-07-22 DIAGNOSIS — R112 Nausea with vomiting, unspecified: Principal | ICD-10-CM | POA: Diagnosis present

## 2015-07-22 DIAGNOSIS — E871 Hypo-osmolality and hyponatremia: Secondary | ICD-10-CM | POA: Diagnosis present

## 2015-07-22 DIAGNOSIS — J9611 Chronic respiratory failure with hypoxia: Secondary | ICD-10-CM | POA: Diagnosis present

## 2015-07-22 LAB — BASIC METABOLIC PANEL
ANION GAP: 12 (ref 5–15)
BUN: <5 mg/dL — ABNORMAL LOW (ref 6–20)
CALCIUM: 9 mg/dL (ref 8.9–10.3)
CO2: 26 mmol/L (ref 22–32)
Chloride: 80 mmol/L — ABNORMAL LOW (ref 101–111)
Creatinine, Ser: 0.42 mg/dL — ABNORMAL LOW (ref 0.44–1.00)
Glucose, Bld: 216 mg/dL — ABNORMAL HIGH (ref 65–99)
Potassium: 3.1 mmol/L — ABNORMAL LOW (ref 3.5–5.1)
SODIUM: 118 mmol/L — AB (ref 135–145)

## 2015-07-22 LAB — MAGNESIUM: Magnesium: 0.9 mg/dL — CL (ref 1.7–2.4)

## 2015-07-22 LAB — COMPREHENSIVE METABOLIC PANEL
ALBUMIN: 3.3 g/dL — AB (ref 3.5–5.0)
ALT: 22 U/L (ref 14–54)
AST: 34 U/L (ref 15–41)
Alkaline Phosphatase: 104 U/L (ref 38–126)
Anion gap: 14 (ref 5–15)
CHLORIDE: 78 mmol/L — AB (ref 101–111)
CO2: 28 mmol/L (ref 22–32)
CREATININE: 0.53 mg/dL (ref 0.44–1.00)
Calcium: 9.6 mg/dL (ref 8.9–10.3)
GFR calc non Af Amer: >60 mL/min (ref >60)
GLUCOSE: 208 mg/dL — AB (ref 65–99)
Potassium: 2.6 mmol/L — CL (ref 3.5–5.1)
SODIUM: 120 mmol/L — AB (ref 135–145)
Total Bilirubin: 0.6 mg/dL (ref 0.3–1.2)
Total Protein: 7 g/dL (ref 6.5–8.1)

## 2015-07-22 LAB — GLUCOSE, CAPILLARY
GLUCOSE-CAPILLARY: 216 mg/dL — AB (ref 65–99)
GLUCOSE-CAPILLARY: 183 mg/dL — AB (ref 65–99)
Glucose-Capillary: 199 mg/dL — ABNORMAL HIGH (ref 65–99)

## 2015-07-22 LAB — CBC
HCT: 35.8 % — ABNORMAL LOW (ref 36.0–46.0)
Hemoglobin: 12.7 g/dL (ref 12.0–15.0)
MCH: 29.5 pg (ref 26.0–34.0)
MCHC: 35.5 g/dL (ref 30.0–36.0)
MCV: 83.3 fL (ref 78.0–100.0)
PLATELETS: 313 10*3/uL (ref 150–400)
RBC: 4.3 MIL/uL (ref 3.87–5.11)
RDW: 13.4 % (ref 11.5–15.5)
WBC: 8.1 10*3/uL (ref 4.0–10.5)

## 2015-07-22 LAB — CBG MONITORING, ED: GLUCOSE-CAPILLARY: 187 mg/dL — AB (ref 65–99)

## 2015-07-22 MED ORDER — POTASSIUM CHLORIDE 10 MEQ/100ML IV SOLN
10.0000 meq | INTRAVENOUS | Status: AC
  Administered 2015-07-22: 10 meq via INTRAVENOUS
  Filled 2015-07-22: qty 100

## 2015-07-22 MED ORDER — POTASSIUM CHLORIDE IN NACL 40-0.9 MEQ/L-% IV SOLN: INTRAVENOUS | Status: DC

## 2015-07-22 MED ORDER — VITAMIN D 1000 UNITS PO TABS
1000.0000 [IU] | ORAL_TABLET | Freq: Every day | ORAL | Status: DC
  Administered 2015-07-22 – 2015-07-23 (×2): 1000 [IU] via ORAL
  Filled 2015-07-22 (×3): qty 1

## 2015-07-22 MED ORDER — POTASSIUM CHLORIDE IN NACL 40-0.9 MEQ/L-% IV SOLN
INTRAVENOUS | Status: DC
  Administered 2015-07-22: 50 mL/h via INTRAVENOUS
  Filled 2015-07-22: qty 1000

## 2015-07-22 MED ORDER — LAMOTRIGINE 25 MG PO TABS
125.0000 mg | ORAL_TABLET | Freq: Two times a day (BID) | ORAL | Status: DC
  Administered 2015-07-22 – 2015-07-25 (×7): 125 mg via ORAL
  Filled 2015-07-22 (×6): qty 5
  Filled 2015-07-22 (×2): qty 1

## 2015-07-22 MED ORDER — CALCIUM CARBONATE-VITAMIN D 500-200 MG-UNIT PO TABS
1.0000 | ORAL_TABLET | Freq: Two times a day (BID) | ORAL | Status: DC
  Administered 2015-07-22 – 2015-07-25 (×7): 1 via ORAL
  Filled 2015-07-22 (×6): qty 1

## 2015-07-22 MED ORDER — BUDESONIDE-FORMOTEROL FUMARATE 80-4.5 MCG/ACT IN AERO
2.0000 | INHALATION_SPRAY | Freq: Two times a day (BID) | RESPIRATORY_TRACT | Status: DC
  Administered 2015-07-22 – 2015-07-25 (×6): 2 via RESPIRATORY_TRACT
  Filled 2015-07-22: qty 6.9

## 2015-07-22 MED ORDER — POTASSIUM CHLORIDE CRYS ER 20 MEQ PO TBCR
40.0000 meq | EXTENDED_RELEASE_TABLET | Freq: Two times a day (BID) | ORAL | Status: AC
  Administered 2015-07-22 (×2): 40 meq via ORAL
  Filled 2015-07-22 (×2): qty 2

## 2015-07-22 MED ORDER — ALBUTEROL SULFATE HFA 108 (90 BASE) MCG/ACT IN AERS
2.0000 | INHALATION_SPRAY | RESPIRATORY_TRACT | Status: DC | PRN
  Administered 2015-07-22: 2 via RESPIRATORY_TRACT
  Filled 2015-07-22: qty 6.7

## 2015-07-22 MED ORDER — PANTOPRAZOLE SODIUM 40 MG PO TBEC
40.0000 mg | DELAYED_RELEASE_TABLET | Freq: Every day | ORAL | Status: DC
  Administered 2015-07-22 – 2015-07-25 (×4): 40 mg via ORAL
  Filled 2015-07-22 (×3): qty 1

## 2015-07-22 MED ORDER — PROMETHAZINE HCL 25 MG/ML IJ SOLN: 12.5000 mg | Freq: Four times a day (QID) | INTRAMUSCULAR | Status: DC | PRN

## 2015-07-22 MED ORDER — QUETIAPINE FUMARATE ER 400 MG PO TB24
800.0000 mg | ORAL_TABLET | Freq: Every day | ORAL | Status: DC
  Administered 2015-07-22 – 2015-07-24 (×3): 800 mg via ORAL
  Filled 2015-07-22 (×4): qty 2

## 2015-07-22 MED ORDER — ACETAMINOPHEN 325 MG PO TABS
650.0000 mg | ORAL_TABLET | Freq: Four times a day (QID) | ORAL | Status: DC | PRN
  Administered 2015-07-22 – 2015-07-23 (×2): 650 mg via ORAL
  Filled 2015-07-22 (×2): qty 2

## 2015-07-22 MED ORDER — METOCLOPRAMIDE HCL 5 MG/ML IJ SOLN
10.0000 mg | Freq: Once | INTRAMUSCULAR | Status: AC
  Administered 2015-07-22: 10 mg via INTRAVENOUS
  Filled 2015-07-22: qty 2

## 2015-07-22 MED ORDER — NIACIN ER (ANTIHYPERLIPIDEMIC) 500 MG PO TBCR
500.0000 mg | EXTENDED_RELEASE_TABLET | Freq: Every day | ORAL | Status: DC
Start: 2015-07-22 — End: 2015-07-25
  Administered 2015-07-22 – 2015-07-24 (×3): 500 mg via ORAL
  Filled 2015-07-22 (×4): qty 1

## 2015-07-22 MED ORDER — POTASSIUM CHLORIDE CRYS ER 20 MEQ PO TBCR: 40.0000 meq | EXTENDED_RELEASE_TABLET | Freq: Once | ORAL | Status: DC

## 2015-07-22 MED ORDER — IPRATROPIUM-ALBUTEROL 0.5-2.5 (3) MG/3ML IN SOLN
3.0000 mL | Freq: Four times a day (QID) | RESPIRATORY_TRACT | Status: DC | PRN
  Administered 2015-07-25: 3 mL via RESPIRATORY_TRACT
  Filled 2015-07-22 (×2): qty 3

## 2015-07-22 MED ORDER — ACETAMINOPHEN 650 MG RE SUPP
650.0000 mg | Freq: Four times a day (QID) | RECTAL | Status: DC | PRN
Start: 2015-07-22 — End: 2015-07-25

## 2015-07-22 MED ORDER — INSULIN ASPART 100 UNIT/ML ~~LOC~~ SOLN
0.0000 [IU] | SUBCUTANEOUS | Status: DC
  Administered 2015-07-22 (×3): 3 [IU] via SUBCUTANEOUS
  Administered 2015-07-23 (×2): 5 [IU] via SUBCUTANEOUS
  Administered 2015-07-23 (×2): 8 [IU] via SUBCUTANEOUS
  Administered 2015-07-23: 5 [IU] via SUBCUTANEOUS
  Administered 2015-07-23: 3 [IU] via SUBCUTANEOUS
  Administered 2015-07-24 (×3): 8 [IU] via SUBCUTANEOUS
  Administered 2015-07-24 (×2): 5 [IU] via SUBCUTANEOUS
  Administered 2015-07-25: 3 [IU] via SUBCUTANEOUS
  Administered 2015-07-25: 5 [IU] via SUBCUTANEOUS
  Administered 2015-07-25: 8 [IU] via SUBCUTANEOUS
  Filled 2015-07-22: qty 1

## 2015-07-22 MED ORDER — ONDANSETRON HCL 4 MG/2ML IJ SOLN
4.0000 mg | Freq: Once | INTRAMUSCULAR | Status: AC
  Administered 2015-07-22: 4 mg via INTRAVENOUS
  Filled 2015-07-22: qty 2

## 2015-07-22 MED ORDER — ONDANSETRON HCL 4 MG/2ML IJ SOLN
4.0000 mg | Freq: Four times a day (QID) | INTRAMUSCULAR | Status: DC
  Administered 2015-07-22 – 2015-07-25 (×10): 4 mg via INTRAVENOUS
  Filled 2015-07-22 (×9): qty 2

## 2015-07-22 MED ORDER — OXYCODONE-ACETAMINOPHEN 5-325 MG PO TABS
1.0000 | ORAL_TABLET | Freq: Three times a day (TID) | ORAL | Status: DC | PRN
  Administered 2015-07-22 – 2015-07-25 (×7): 1 via ORAL
  Filled 2015-07-22 (×7): qty 1

## 2015-07-22 MED ORDER — POTASSIUM CHLORIDE CRYS ER 20 MEQ PO TBCR: 40.0000 meq | EXTENDED_RELEASE_TABLET | Freq: Every day | ORAL | Status: DC

## 2015-07-22 MED ORDER — ZOLPIDEM TARTRATE 5 MG PO TABS
5.0000 mg | ORAL_TABLET | Freq: Every day | ORAL | Status: DC
  Administered 2015-07-22 – 2015-07-24 (×3): 5 mg via ORAL
  Filled 2015-07-22 (×3): qty 1

## 2015-07-22 MED ORDER — HYDROMORPHONE HCL 1 MG/ML IJ SOLN
1.0000 mg | Freq: Once | INTRAMUSCULAR | Status: AC
  Administered 2015-07-22: 1 mg via INTRAVENOUS
  Filled 2015-07-22: qty 1

## 2015-07-22 MED ORDER — SODIUM CHLORIDE 0.9 % IV BOLUS (SEPSIS)
1000.0000 mL | Freq: Once | INTRAVENOUS | Status: AC
  Administered 2015-07-22: 1000 mL via INTRAVENOUS

## 2015-07-22 MED ORDER — METFORMIN HCL 500 MG PO TABS: 1000.0000 mg | ORAL_TABLET | Freq: Two times a day (BID) | ORAL | Status: DC

## 2015-07-22 MED ORDER — ASPIRIN EC 81 MG PO TBEC
81.0000 mg | DELAYED_RELEASE_TABLET | Freq: Every day | ORAL | Status: DC
  Administered 2015-07-22 – 2015-07-25 (×4): 81 mg via ORAL
  Filled 2015-07-22 (×3): qty 1

## 2015-07-22 MED ORDER — PNEUMOCOCCAL VAC POLYVALENT 25 MCG/0.5ML IJ INJ
0.5000 mL | INJECTION | INTRAMUSCULAR | Status: AC
  Administered 2015-07-25: 0.5 mL via INTRAMUSCULAR
  Filled 2015-07-22: qty 0.5

## 2015-07-22 MED ORDER — DEXAMETHASONE SODIUM PHOSPHATE 4 MG/ML IJ SOLN
4.0000 mg | Freq: Four times a day (QID) | INTRAMUSCULAR | Status: DC
  Administered 2015-07-22 – 2015-07-23 (×5): 4 mg via INTRAVENOUS
  Filled 2015-07-22 (×5): qty 1

## 2015-07-22 MED ORDER — LORAZEPAM 0.5 MG PO TABS
0.5000 mg | ORAL_TABLET | Freq: Three times a day (TID) | ORAL | Status: DC | PRN
  Administered 2015-07-22 – 2015-07-25 (×4): 0.5 mg via ORAL
  Filled 2015-07-22 (×5): qty 1

## 2015-07-22 MED ORDER — GABAPENTIN 300 MG PO CAPS
600.0000 mg | ORAL_CAPSULE | Freq: Three times a day (TID) | ORAL | Status: DC
  Administered 2015-07-22 – 2015-07-25 (×9): 600 mg via ORAL
  Filled 2015-07-22 (×9): qty 2

## 2015-07-22 MED ORDER — TIOTROPIUM BROMIDE MONOHYDRATE 18 MCG IN CAPS
18.0000 ug | ORAL_CAPSULE | Freq: Every day | RESPIRATORY_TRACT | Status: DC
  Administered 2015-07-23 – 2015-07-25 (×3): 18 ug via RESPIRATORY_TRACT
  Filled 2015-07-22: qty 5

## 2015-07-22 MED ORDER — INSULIN GLARGINE 100 UNIT/ML ~~LOC~~ SOLN
10.0000 [IU] | Freq: Every day | SUBCUTANEOUS | Status: DC
  Administered 2015-07-22 – 2015-07-24 (×3): 10 [IU] via SUBCUTANEOUS
  Filled 2015-07-22 (×4): qty 0.1

## 2015-07-22 MED ORDER — SIMVASTATIN 40 MG PO TABS
40.0000 mg | ORAL_TABLET | Freq: Every day | ORAL | Status: DC
  Administered 2015-07-22 – 2015-07-24 (×3): 40 mg via ORAL
  Filled 2015-07-22 (×3): qty 1

## 2015-07-22 MED ORDER — ENOXAPARIN SODIUM 40 MG/0.4ML ~~LOC~~ SOLN
40.0000 mg | SUBCUTANEOUS | Status: DC
Start: 2015-07-22 — End: 2015-07-25
  Administered 2015-07-22 – 2015-07-25 (×4): 40 mg via SUBCUTANEOUS
  Filled 2015-07-22 (×3): qty 0.4

## 2015-07-22 MED ORDER — MAGNESIUM SULFATE 2 GM/50ML IV SOLN
2.0000 g | Freq: Once | INTRAVENOUS | Status: AC
  Administered 2015-07-22: 2 g via INTRAVENOUS
  Filled 2015-07-22: qty 50

## 2015-07-22 MED ORDER — POTASSIUM CHLORIDE 10 MEQ/100ML IV SOLN
10.0000 meq | Freq: Once | INTRAVENOUS | Status: AC
  Administered 2015-07-22: 10 meq via INTRAVENOUS
  Filled 2015-07-22: qty 100

## 2015-07-22 NOTE — ED Notes (Signed)
Per EMS, pt from [facility] x 3 days complains of head, neck and back pain that feel like muscle spasms. Pt states that her headache is sensitive to light, denies hx of migraines. Pt admits to some nausea but no vomiting. BP 150/70, HR 80, RR 16, CBG 140. Alert and oriented x 4. Pt has PICC line for chemo for stage 4 lung cancer.

## 2015-07-22 NOTE — Progress Notes (Signed)
Repeat Na 118 so IVFs dc'd- diet advanced to Carb modified w/ 1000 cc fluid restriction.  Erin Hearing, ANP

## 2015-07-22 NOTE — ED Provider Notes (Signed)
CSN: [id number]     Arrival date & time 07/22/15  0609 History   First MD Initiated Contact with Patient 07/22/15 0703     Chief Complaint  Patient presents with  . Headache     (Consider location/radiation/quality/duration/timing/severity/associated sxs/prior Treatment) Patient is a 54 y.o. female presenting with headaches. The history is provided by the patient.  Headache Associated symptoms: nausea and vomiting   Associated symptoms: no abdominal pain, no congestion, no diarrhea, no eye pain, no fever, no neck pain, no neck stiffness, no numbness, no sinus pressure, no sore throat and no weakness   Pt c/o frontal and superior located headache for the past 3 days. Pain was milder at onset, gradual in onset. Slowly worse. Hx headaches, but states this headache is now worse. No neck stiffness/rigidity. No recent fall or head injury. No syncope. No eye pain or change in vision. No numbness/weakness, or loss of normal functional ability. No position change, or change w time of day. No sinus drainage or pain. No fever or chills.  Patient also notes intermittent, recurrent vomiting in the past week, is persistently nauseated. Hx same. Denies diarrhea. No abd distension or pain.  Emesis not bloody or bilious. No diarrhea. Recently dx lung ca, is due to start tx next week.     Past Medical History  Diagnosis Date  . Diabetes mellitus   . Pancreatitis   . Anemia   . Bipolar 1 disorder ([place])   . Alcohol abuse   . 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 COPD (chronic obstructive pulmonary disease) ([place])   . Hypotension   . Major depressive disorder ([place])   . Cocaine abuse   . Chronic back pain   . Renal disorder   . Acidosis   . Hyperkalemia   . Abscess   . Acute encephalopathy   . Septic shock [facility])    Past Surgical History  Procedure Laterality Date  . Cholecystectomy    . Appendectomy    . Tonsillectomy    . Incision and drainage abscess Right 03/01/2013     Procedure: INCISION AND DRAINAGE ABSCESS;  Surgeon: Harl Bowie, MD;  Location: [facility];  Service: General;  Laterality: Right;  . Cesarean section    . Video bronchoscopy with endobronchial ultrasound N/A 06/21/2015    Procedure: VIDEO BRONCHOSCOPY WITH ENDOBRONCHIAL ULTRASOUND;  Surgeon: Javier Glazier, MD;  Location: [facility];  Service: Thoracic;  Laterality: N/A;  . Flexible bronchoscopy N/A 06/21/2015    Procedure: BRONCHOSCOPY;  Surgeon: Javier Glazier, MD;  Location: [facility];  Service: Thoracic;  Laterality: N/A;   Family History  Problem Relation Age of Onset  . Diabetes Mother   . Cancer Mother     breast   Social History  Substance Use Topics  . Smoking status: Current Every Day Smoker -- 0.00 packs/day    Types: Cigarettes  . Smokeless tobacco: Never Used  . Alcohol Use: No     Comment: hx of etoh abuse   OB History    No data available     Review of Systems  Constitutional: Negative for fever and chills.  HENT: Negative for congestion, sinus pressure and sore throat.   Eyes: Negative for pain and visual disturbance.  Respiratory: Negative for shortness of breath.   Cardiovascular: Negative for chest pain.  Gastrointestinal: Positive for nausea and vomiting. Negative for abdominal pain and diarrhea.  Genitourinary: Negative for dysuria and flank pain.  Musculoskeletal: Negative  for neck pain and neck stiffness.  Skin: Negative for rash.  Neurological: Positive for headaches. Negative for syncope, speech difficulty, weakness and numbness.  Hematological: Does not bruise/bleed easily.  Psychiatric/Behavioral: Negative for confusion.      Allergies  Review of patient's allergies indicates no known allergies.  Home Medications   Prior to Admission medications   Medication Sig Start Date End Date Taking? Authorizing Provider  albuterol (PROVENTIL HFA;VENTOLIN HFA) 108 (90 BASE) MCG/ACT inhaler Inhale 2 puffs into the lungs every 4 (four) hours as needed  for wheezing or shortness of breath. 93/8/10  Yes Delora Fuel, MD  acetaminophen (TYLENOL) 325 MG tablet Take 325 mg by mouth 3 (three) times daily. Prior to fish oil    Historical Provider, MD  aspirin EC 81 MG tablet Take 1 tablet (81 mg total) by mouth daily. 06/25/15   Velvet Bathe, MD  budesonide-formoterol (SYMBICORT) 80-4.5 MCG/ACT inhaler Inhale 2 puffs into the lungs 2 (two) times daily.    Historical Provider, MD  calcium-vitamin D (OSCAL WITH D) 500-200 MG-UNIT tablet Take 1 tablet by mouth 2 (two) times daily.    Historical Provider, MD  cholecalciferol (VITAMIN D) 1000 UNITS tablet Take 1,000 Units by mouth daily.    Historical Provider, MD  docusate sodium (COLACE) 100 MG capsule Take 100 mg by mouth 2 (two) times daily.    Historical Provider, MD  gabapentin (NEURONTIN) 300 MG capsule Take 600 mg by mouth 3 (three) times daily.    Historical Provider, MD  insulin aspart (NOVOLOG) 100 UNIT/ML injection Inject 5 Units into the skin 3 (three) times daily with meals. 06/11/15   Orson Eva, MD  insulin glargine (LANTUS) 100 UNIT/ML injection Inject 0.1 mLs (10 Units total) into the skin at bedtime. 06/11/15   Orson Eva, MD  ipratropium-albuterol (DUONEB) 0.5-2.5 (3) MG/3ML SOLN Take 3 mLs by nebulization every 6 (six) hours as needed. 06/11/15   Orson Eva, MD  lamoTRIgine (LAMICTAL) 100 MG tablet Take 100 mg by mouth 2 (two) times daily. Take with 25 mg tablet    Historical Provider, MD  lamoTRIgine (LAMICTAL) 25 MG tablet Take 25 mg by mouth 2 (two) times daily. Takes with '100mg'$  tablet    Historical Provider, MD  lidocaine-prilocaine (EMLA) cream Apply 1 application topically as needed. Apply 1 hr prior to chemotherapy 07/18/15 07/17/16  Rondel Jumbo, PA-C  LORazepam (ATIVAN) 0.5 MG tablet Take 1 tablet (0.5 mg total) by mouth every 8 (eight) hours as needed for anxiety. 11/29/14   Nishant Dhungel, MD  metFORMIN (GLUCOPHAGE) 1000 MG tablet Take 1 tablet (1,000 mg total) by mouth 2 (two) times  daily with a meal. 11/29/14   Nishant Dhungel, MD  niacin (NIASPAN) 750 MG CR tablet Take 750 mg by mouth at bedtime.    Historical Provider, MD  omeprazole (PRILOSEC) 20 MG capsule Take 20 mg by mouth daily before supper.     Historical Provider, MD  oxyCODONE-acetaminophen (PERCOCET/ROXICET) 5-325 MG tablet Take 1 tablet by mouth every 8 (eight) hours as needed for severe pain. 07/03/15   Curt Bears, MD  potassium chloride (K-DUR) 10 MEQ tablet Take 2 tablets (20 mEq total) by mouth daily. 07/18/15 10/16/15  Rondel Jumbo, PA-C  prochlorperazine (COMPAZINE) 10 MG tablet Take 1 tablet (10 mg total) by mouth every 6 (six) hours as needed for nausea or vomiting. 07/03/15   Curt Bears, MD  promethazine (PHENERGAN) 12.5 MG tablet Take 12.5 mg by mouth every 6 (six) hours as needed for  nausea or vomiting.    Historical Provider, MD  QUEtiapine (SEROQUEL XR) 400 MG 24 hr tablet Take 800 mg by mouth at bedtime.    Historical Provider, MD  simvastatin (ZOCOR) 40 MG tablet Take 40 mg by mouth daily at 6 PM.     Historical Provider, MD  tiotropium (SPIRIVA) 18 MCG inhalation capsule Place 18 mcg into inhaler and inhale daily.    Historical Provider, MD  zolpidem (AMBIEN) 10 MG tablet Take 5 mg by mouth at bedtime.     Historical Provider, MD   BP 171/93 mmHg  Pulse 92  Resp 24  SpO2 96% Physical Exam  Constitutional: She is oriented to person, place, and time. She appears well-developed and well-nourished. No distress.  HENT:  Head: Atraumatic.  Nose: Nose normal.  Mouth/Throat: Oropharynx is clear and moist.  No sinus or temporal tenderness.  Eyes: Conjunctivae and EOM are normal. Pupils are equal, round, and reactive to light. No scleral icterus.  Neck: Neck supple. No tracheal deviation present. No thyromegaly present.  No stiffness or rigidity.   Cardiovascular: Normal rate, regular rhythm, normal heart sounds and intact distal pulses.  Exam reveals no gallop and no friction rub.   No  murmur heard. Pulmonary/Chest: Effort normal and breath sounds normal. No respiratory distress.  Abdominal: Soft. Normal appearance and bowel sounds are normal. She exhibits no distension. There is no tenderness.  Genitourinary:  No cva tenderness.  Musculoskeletal: Normal range of motion. She exhibits no edema or tenderness.  Neurological: She is alert and oriented to person, place, and time. No cranial nerve deficit.  Motor intact bilaterally.  stre 5/5. sens grossly intact bil. Steady gait.   Skin: Skin is warm and dry. No rash noted. She is not diaphoretic.  Psychiatric: She has a normal mood and affect.  Nursing note and vitals reviewed.   ED Course  Procedures (including critical care time) Labs Review  Results for orders placed or performed during the hospital encounter of 07/22/15  CBC  Result Value Ref Range   WBC 8.1 4.0 - 10.5 K/uL   RBC 4.30 3.87 - 5.11 MIL/uL   Hemoglobin 12.7 12.0 - 15.0 g/dL   HCT 35.8 (L) 36.0 - 46.0 %   MCV 83.3 78.0 - 100.0 fL   MCH 29.5 26.0 - 34.0 pg   MCHC 35.5 30.0 - 36.0 g/dL   RDW 13.4 11.5 - 15.5 %   Platelets 313 150 - 400 K/uL  Comprehensive metabolic panel  Result Value Ref Range   Sodium 120 (L) 135 - 145 mmol/L   Potassium 2.6 (LL) 3.5 - 5.1 mmol/L   Chloride 78 (L) 101 - 111 mmol/L   CO2 28 22 - 32 mmol/L   Glucose, Bld 208 (H) 65 - 99 mg/dL   BUN <5 (L) 6 - 20 mg/dL   Creatinine, Ser 0.53 0.44 - 1.00 mg/dL   Calcium 9.6 8.9 - 10.3 mg/dL   Total Protein 7.0 6.5 - 8.1 g/dL   Albumin 3.3 (L) 3.5 - 5.0 g/dL   AST 34 15 - 41 U/L   ALT 22 14 - 54 U/L   Alkaline Phosphatase 104 38 - 126 U/L   Total Bilirubin 0.6 0.3 - 1.2 mg/dL   GFR calc non Af Amer >60 >60 mL/min   GFR calc Af Amer >60 >60 mL/min   Anion gap 14 5 - 15   Ct Head Wo Contrast  07/22/2015  CLINICAL DATA:  Small cell lung carcinoma.  Headache. EXAM:  CT HEAD WITHOUT CONTRAST TECHNIQUE: Contiguous axial images were obtained from the base of the skull through the  vertex without intravenous contrast. COMPARISON:  Head CT June 06, 2015; brain MRI June 26, 2015 FINDINGS: The ventricles are normal in size and configuration. There is no intracranial mass, hemorrhage, extra-axial fluid collection, or midline shift. There is mild small vessel disease in the centra semiovale bilaterally. There is no new gray-white compartment lesion. No acute infarct evident. The bony calvarium appears intact. There is patchy mastoid air cell disease on the left, stable. Mastoids on the right are clear. There is apparent cerumen in the right external auditory canal, a finding also present previously. There is a retention cyst in the posterior left maxillary antrum. There is ethmoid sinus mucosal thickening bilaterally. No intraorbital lesions are identified. IMPRESSION: Mild periventricular small vessel disease. No intracranial mass, hemorrhage, or extra-axial fluid collection. 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 Cerumen right external auditory canal. Areas of paranasal sinus disease as noted above. Electronically Signed   By: Lowella Grip III M.D.   On: 07/22/2015 08:05      I have personally reviewed and evaluated these images and lab results as part of my medical decision-making.    MDM   Iv ns.  Reviewed nursing notes and prior charts for additional history.   Dilaudid 1 mg iv. reglan iv.  Pt with persistent nausea/vomiting.  zofran iv.  Iv ns bolus.   k low, mg added to labs. kcl iv and po.   On review labs recently admission last month for low NA, improved upon discharge to 131, but now recurrently and persistently low since.  Prior notes reference suspected siadh/small cell lung ca.    Given persistent/intractable nv, hyponatremia, hypokalemia, will admit.   Recheck abd soft nt.        Lajean Saver, MD 07/22/15 [phone]

## 2015-07-22 NOTE — ED Notes (Signed)
cbg 187

## 2015-07-22 NOTE — ED Notes (Signed)
Lunch tray ordered 

## 2015-07-22 NOTE — ED Notes (Signed)
Wet cloth placed over pt's forehead. Pt states "That feels much better"

## 2015-07-22 NOTE — Progress Notes (Signed)
07/22/15 Report received on patient from Emergency Room  From [place], Dx of Intractable Nausea/ Vomiting, From [facility], Diet - full Liquid, IV site  RT A/C with NS with 40 K+ at 50 ml/ hr, needs 2 runs of K+, CBG's every 4 hours,Routine vital signs,Fall risk high, Full code, NKA for allergies, Hx of N/V. HTN, COPD,Headaches, Chronic Resp failure,Bipolar, Diabetes 2,HLD,SIADH, Hypomagnesemia,and Malignant Neoplasm of Left Lobe.

## 2015-07-22 NOTE — H&P (Signed)
[facility] History and Physical                                                                                    Sarah Leonard, is a 54 y.o. female  MRN: [id number]   DOB - September 18, 1960  Admit Date - 07/22/2015  Outpatient Primary MD for the patient is Moreen Fowler  Referring MD: Ashok Cordia / ER  With History of -  Past Medical History  Diagnosis Date  . Diabetes mellitus   . Pancreatitis   . Anemia   . Bipolar 1 disorder ([place])   . Alcohol abuse   . 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 COPD (chronic obstructive pulmonary disease) ([place])   . Hypotension   . Major depressive disorder ([place])   . Cocaine abuse   . Chronic back pain   . Renal disorder   . Acidosis   . Hyperkalemia   . Abscess   . Acute encephalopathy   . Septic shock [facility])       Past Surgical History  Procedure Laterality Date  . Cholecystectomy    . Appendectomy    . Tonsillectomy    . Incision and drainage abscess Right 03/01/2013    Procedure: INCISION AND DRAINAGE ABSCESS;  Surgeon: Harl Bowie, MD;  Location: [place];  Service: General;  Laterality: Right;  . Cesarean section    . Video bronchoscopy with endobronchial ultrasound N/A 06/21/2015    Procedure: VIDEO BRONCHOSCOPY WITH ENDOBRONCHIAL ULTRASOUND;  Surgeon: Javier Glazier, MD;  Location: [place];  Service: Thoracic;  Laterality: N/A;  . Flexible bronchoscopy N/A 06/21/2015    Procedure: BRONCHOSCOPY;  Surgeon: Javier Glazier, MD;  Location: [place];  Service: Thoracic;  Laterality: N/A;    in for   Chief Complaint  Patient presents with  . 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 She has been evaluated as an outpatient by radiation oncology as well as Dr. Julien Nordmann with medical oncology and plans are to start chemotherapy next week. She has multiple chronic electrolyte abnormalities including chronic  hyponatremia and hypochloremia in the setting of diagnosed SIADH, chronic hypokalemia and chronic hypomagnesemia. She was last discharged on 11/11 to assisted living facility after an admission for nausea vomiting and diarrhea with worsening of her underlying hyponatremia. 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 At that time she did not carry a diagnosis of pulmonary carcinoma. She was given a one-time dose of Samsca with improvement in her sodium to 131 and at time of discharge her baseline sodium was documented as being in the 120s. Since discharge her sodium has averaged between 118 and 123. She returns to the ER today in the setting of headache with associated intractable nausea and vomiting. Patient reports a history of migraines in the past. She primarily describes this headache as  being on the "top" of her head although by my evaluation this discomfort had migrated to the left periorbital agent. She's not had any fevers, no neck stiffness or rigidity, no recent falls or traumas, no visual changes or focal neurological deficits, no change in functional ability, sinus infection type symptoms including drainage or pain. She reports she has been having excessive episodes of vomiting for at least 4-5 days. She's not had any GI symptoms or abdominal pain and no diarrhea. She has not started chemotherapy.  In the ER the patient was afebrile, somewhat mildly hypertensive with a blood pressure of 175/92, pulse was 94, respirations 24 room air saturations were 95%. Patient reports she wears nasal cannula oxygen at home and subsequently 3 L oxygen was applied. Noncontrasted CT the head was completed and was unremarkable. Patient did undergo MRI of the brain with and without contrast on 11/15 during  previous admission and this did not demonstrate any areas of metastatic disease. Sodium was 120, potassium lower than usual at 2.6 with typical ranges since discharge between 3.1 and 3.2 on low-dose potassium supplementation. BUN was less than 5 chloride low at 78, CBC normal, magnesium very low at 0.9 with previous readings ranging between 1.0 when last checked November 2016. Glucose somewhat elevated at 208.  Upon my evaluation of the patient she reports her nausea has resolved and she is requesting ice cream to eat.    Review of Systems   In addition to the HPI above,  No Fever-chills, myalgias or other constitutional symptoms No changes with Vision or hearing, new weakness, tingling, numbness in any extremity, No problems swallowing food or Liquids, indigestion/reflux No Chest pain, Cough or Shortness of Breath, palpitations, orthopnea or DOE No Abdominal pain, melena or hematochezia, no dark tarry stools, Bowel movements are regular, No dysuria, hematuria or flank pain No new skin rashes, lesions, masses or bruises, No new joints pains-aches No recent weight gain or loss No polyuria, polydypsia or polyphagia,  *A full 10 point Review of Systems was done, except as stated above, all other Review of Systems were negative.  Social History Social History  Substance Use Topics  . Smoking status: Current Every Day Smoker -- 0.00 packs/day    Types: Cigarettes  . Smokeless tobacco: Never Used  . Alcohol Use: No     Comment: hx of etoh abuse    Resides at: [facility]  Lives with: N/A  Ambulatory status: Without assistive devices although with previous physical therapy evaluation in October did better by holding onto handrail in hallway for support   Family History Family History  Problem Relation Age of Onset  . Diabetes Mother   . Cancer Mother     breast     Prior to Admission medications   Medication Sig Start Date End Date Taking? Authorizing Provider    albuterol (PROVENTIL HFA;VENTOLIN HFA) 108 (90 BASE) MCG/ACT inhaler Inhale 2 puffs into the lungs every 4 (four) hours as needed for wheezing or shortness of breath. 71/6/96  Yes Delora Fuel, MD  acetaminophen (TYLENOL) 325 MG tablet Take 325 mg by mouth 3 (three) times daily. Prior to fish oil    Historical Provider, MD  aspirin EC 81 MG tablet Take 1 tablet (81 mg total) by mouth daily. 06/25/15   Velvet Bathe, MD  budesonide-formoterol (SYMBICORT) 80-4.5 MCG/ACT inhaler Inhale 2 puffs into the lungs 2 (two) times daily.    Historical Provider, MD  calcium-vitamin D (OSCAL WITH D) 500-200 MG-UNIT tablet Take  1 tablet by mouth 2 (two) times daily.    Historical Provider, MD  cholecalciferol (VITAMIN D) 1000 UNITS tablet Take 1,000 Units by mouth daily.    Historical Provider, MD  docusate sodium (COLACE) 100 MG capsule Take 100 mg by mouth 2 (two) times daily.    Historical Provider, MD  gabapentin (NEURONTIN) 300 MG capsule Take 600 mg by mouth 3 (three) times daily.    Historical Provider, MD  insulin aspart (NOVOLOG) 100 UNIT/ML injection Inject 5 Units into the skin 3 (three) times daily with meals. 06/11/15   Orson Eva, MD  insulin glargine (LANTUS) 100 UNIT/ML injection Inject 0.1 mLs (10 Units total) into the skin at bedtime. 06/11/15   Orson Eva, MD  ipratropium-albuterol (DUONEB) 0.5-2.5 (3) MG/3ML SOLN Take 3 mLs by nebulization every 6 (six) hours as needed. 06/11/15   Orson Eva, MD  lamoTRIgine (LAMICTAL) 100 MG tablet Take 100 mg by mouth 2 (two) times daily. Take with 25 mg tablet    Historical Provider, MD  lamoTRIgine (LAMICTAL) 25 MG tablet Take 25 mg by mouth 2 (two) times daily. Takes with '100mg'$  tablet    Historical Provider, MD  lidocaine-prilocaine (EMLA) cream Apply 1 application topically as needed. Apply 1 hr prior to chemotherapy 07/18/15 07/17/16  Rondel Jumbo, PA-C  LORazepam (ATIVAN) 0.5 MG tablet Take 1 tablet (0.5 mg total) by mouth every 8 (eight) hours as needed for  anxiety. 11/29/14   Nishant Dhungel, MD  metFORMIN (GLUCOPHAGE) 1000 MG tablet Take 1 tablet (1,000 mg total) by mouth 2 (two) times daily with a meal. 11/29/14   Nishant Dhungel, MD  niacin (NIASPAN) 750 MG CR tablet Take 750 mg by mouth at bedtime.    Historical Provider, MD  omeprazole (PRILOSEC) 20 MG capsule Take 20 mg by mouth daily before supper.     Historical Provider, MD  oxyCODONE-acetaminophen (PERCOCET/ROXICET) 5-325 MG tablet Take 1 tablet by mouth every 8 (eight) hours as needed for severe pain. 07/03/15   Curt Bears, MD  potassium chloride (K-DUR) 10 MEQ tablet Take 2 tablets (20 mEq total) by mouth daily. 07/18/15 10/16/15  Rondel Jumbo, PA-C  prochlorperazine (COMPAZINE) 10 MG tablet Take 1 tablet (10 mg total) by mouth every 6 (six) hours as needed for nausea or vomiting. 07/03/15   Curt Bears, MD  promethazine (PHENERGAN) 12.5 MG tablet Take 12.5 mg by mouth every 6 (six) hours as needed for nausea or vomiting.    Historical Provider, MD  QUEtiapine (SEROQUEL XR) 400 MG 24 hr tablet Take 800 mg by mouth at bedtime.    Historical Provider, MD  simvastatin (ZOCOR) 40 MG tablet Take 40 mg by mouth daily at 6 PM.     Historical Provider, MD  tiotropium (SPIRIVA) 18 MCG inhalation capsule Place 18 mcg into inhaler and inhale daily.    Historical Provider, MD  zolpidem (AMBIEN) 10 MG tablet Take 5 mg by mouth at bedtime.     Historical Provider, MD    No Known Allergies  Physical Exam  Vitals  Blood pressure 150/103, pulse 87, temperature 97.8 F (36.6 C), temperature source Oral, resp. rate 18, SpO2 96 %.   General:  In no acute distress, appears healthy and well nourished  Psych:  Normal affect, Denies Suicidal or Homicidal ideations, Awake Alert, Oriented X name and place but otherwise pleasantly confused. Speech and thought patterns are clear and appropriate  Neuro:   No focal neurological deficits, CN II through XII intact, Strength 5/5 all  4 extremities,  Sensation intact all 4 extremities.  ENT:  Ears and Eyes appear Normal, Conjunctivae clear, PER. Moist oral mucosa without erythema or exudates.  Neck:  Supple, No lymphadenopathy appreciated  Respiratory:  Symmetrical chest wall movement, Good air movement bilaterally, they diffuse expiratory wheezing. 3 L oxygen  Cardiac:  RRR, No Murmurs, trace bilateral LE edema noted, no JVD, No carotid bruits, peripheral pulses palpable at 2+  Abdomen:  Positive bowel sounds, Soft, Non tender, Non distended,  No masses appreciated, no obvious hepatosplenomegaly  Skin:  No Cyanosis, Normal Skin Turgor, No Skin Rash or Bruise.  Extremities: Symmetrical without obvious trauma or injury,  no effusions.  Data Review  CBC  Recent Labs Lab 07/17/15 1225 07/18/15 1017 07/22/15 0647  WBC 7.1 5.6 8.1  HGB 11.9* 12.0 12.7  HCT 33.4* 34.7* 35.8*  PLT 275 277 313  MCV 83.9 85.9 83.3  MCH 29.9 29.8 29.5  MCHC 35.6 34.7 35.5  RDW 13.5 14.3 13.4  LYMPHSABS 1.3 1.5  --   MONOABS 0.4 0.4  --   EOSABS 0.1 0.1  --   BASOSABS 0.0 0.0  --     Chemistries   Recent Labs Lab 07/17/15 1225 07/18/15 1017 07/22/15 0647  NA 123* 121* 120*  K 3.1* 2.7* 2.6*  CL 81*  --  78*  CO2 '29 24 28  '$ GLUCOSE 182* 215* 208*  BUN 6 4.1* <5*  CREATININE 0.42* 0.7 0.53  CALCIUM 9.1 9.0 9.6  MG  --   --  0.9*  AST  --  19 34  ALT  --  13 22  ALKPHOS  --  97 104  BILITOT  --  0.34 0.6    estimated creatinine clearance is 89.8 mL/min (by C-G formula based on Cr of 0.53).  No results for input(s): TSH, T4TOTAL, T3FREE, THYROIDAB in the last 72 hours.  Invalid input(s): FREET3  Coagulation profile  Recent Labs Lab 07/17/15 1225  INR 1.05    No results for input(s): DDIMER in the last 72 hours.  Cardiac Enzymes No results for input(s): CKMB, TROPONINI, MYOGLOBIN in the last 168 hours.  Invalid input(s): CK  Invalid input(s): POCBNP  Urinalysis    Component Value Date/Time   COLORURINE YELLOW  06/06/2015 [place] 06/06/2015 1643   LABSPEC 1.015 06/06/2015 1643   PHURINE 7.0 06/06/2015 1643   GLUCOSEU NEGATIVE 06/06/2015 1643   HGBUR NEGATIVE 06/06/2015 [place] 06/06/2015 [place] 06/06/2015 1643   PROTEINUR 30* 06/06/2015 1643   UROBILINOGEN 0.2 06/06/2015 1643   NITRITE NEGATIVE 06/06/2015 1643   LEUKOCYTESUR NEGATIVE 06/06/2015 1643    Imaging results:   Ct Head Wo Contrast  07/22/2015  CLINICAL DATA:  Small cell lung carcinoma.  Headache. EXAM: CT HEAD WITHOUT CONTRAST TECHNIQUE: Contiguous axial images were obtained from the base of the skull through the vertex without intravenous contrast. COMPARISON:  Head CT June 06, 2015; brain MRI June 26, 2015 FINDINGS: The ventricles are normal in size and configuration. There is no intracranial mass, hemorrhage, extra-axial fluid collection, or midline shift. There is mild small vessel disease in the centra semiovale bilaterally. There is no new gray-white compartment lesion. No acute infarct evident. The bony calvarium appears intact. There is patchy mastoid air cell disease on the left, stable. Mastoids on the right are clear. There is apparent cerumen in the right external auditory canal, a finding also present previously. There is a retention cyst in the  posterior left maxillary antrum. There is ethmoid sinus mucosal thickening bilaterally. No intraorbital lesions are identified. IMPRESSION: Mild periventricular small vessel disease. No intracranial mass, hemorrhage, or extra-axial fluid collection. 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 Cerumen right external auditory canal. Areas of paranasal sinus disease as noted above. Electronically Signed   By: Lowella Grip III M.D.   On: 07/22/2015 08:05   Mr Jeri Cos SW Contrast  06/26/2015  CLINICAL DATA:  History of small cell lung cancer. History of diabetes, cocaine abuse, abscess, septic shock. EXAM: MRI HEAD WITHOUT AND WITH  CONTRAST TECHNIQUE: Multiplanar, multiecho pulse sequences of the brain and surrounding structures were obtained without and with intravenous contrast. CONTRAST:  59m MULTIHANCE GADOBENATE DIMEGLUMINE 529 MG/ML IV SOLN COMPARISON:  CT head June 06, 2015 FINDINGS: Mild motion degraded examination. The ventricles and sulci are normal for patient's age. No abnormal parenchymal signal, mass lesions, mass effect. Patchy supratentorial white matter T2 hyperintensities. No abnormal parenchymal enhancement. No reduced diffusion to suggest acute ischemia. No susceptibility artifact to suggest hemorrhage. No abnormal extra-axial fluid collections. No extra-axial masses nor leptomeningeal enhancement. Normal major intracranial vascular flow voids seen at the skull base. Ocular globes and orbital contents are unremarkable though not tailored for evaluation. No suspicious calvarial bone marrow signal. No abnormal sellar expansion. Craniocervical junction maintained. LEFT maxillary sinus air-fluid level. Small LEFT mastoid effusion. IMPRESSION: Mild motion degraded examination without acute intracranial process nor MR findings of intracranial metastasis. Mild to moderate white matter changes most often seen with chronic small vessel ischemic disease. Electronically Signed   By: CElon AlasM.D.   On: 06/26/2015 22:09   Nm Pet Image Initial (pi) Skull Base To Thigh  07/12/2015  CLINICAL DATA:  Initial treatment strategy for newly diagnosed small-cell lung carcinoma based on EBUS FNA of a subcarinal lymph node on 06/21/2015. EXAM: NUCLEAR MEDICINE PET SKULL BASE TO THIGH TECHNIQUE: 12.3 mCi F-18 FDG was injected intravenously. Full-ring PET imaging was performed from the skull base to thigh after the radiotracer. CT data was obtained and used for attenuation correction and anatomic localization. FASTING BLOOD GLUCOSE:  Value: 224 mg/dl COMPARISON:  06/19/2015 chest CT.  06/17/2015 CT abdomen/ pelvis. FINDINGS: NECK No  hypermetabolic lymph nodes in the neck. Mucous retention cyst versus polyp in the inferior posterior left maxillary sinus. CHEST There is a hypermetabolic 2.2 x 2.1 cm posterior right lower lobe pulmonary nodule (series 3/image 118) with max SUV 4.0, increased in size from 1.5 x 1.3 cm on 06/19/2015. There is a suggestion of a new 0.6 cm anterior right lower lobe pulmonary nodule (3/123), below PET resolution. No acute consolidative airspace disease. Mosaic attenuation throughout the lungs. There are multiple hypermetabolic ipsilateral right peribronchial nodes, largest 2.0 cm (series 3/image 90) with max SUV 10.2. There is bulky subcarinal hypermetabolic adenopathy measuring 3.6 cm (series 3/image 93) with max SUV 11.2. There is a mildly hypermetabolic and mildly enlarged right lower paratracheal 1.1 cm node (series 3/image 87) with max SUV 4.3. No contralateral mediastinal, contralateral hilar or supraclavicular hypermetabolic adenopathy. No hypermetabolic axillary nodes. Left anterior descending and right coronary atherosclerosis. Mitral annular calcifications. ABDOMEN/PELVIS No abnormal hypermetabolic activity within the liver, pancreas, adrenal glands, or spleen. No hypermetabolic lymph nodes in the abdomen or pelvis. Mild diffuse hepatic steatosis. Status post cholecystectomy. Two clustered 3 mm nonobstructing stones in the lower left kidney. Malrotated left kidney. Mild fullness of the left renal collecting system without overt hydronephrosis. SKELETON No focal hypermetabolic activity to suggest skeletal metastasis. IMPRESSION:  1. Hypermetabolic 2.2 cm right lower lobe pulmonary nodule, in keeping with a primary bronchogenic carcinoma, which has grown since 06/19/2015. 2. Hypermetabolic ipsilateral peribronchial, subcarinal and ipsilateral paratracheal lymphadenopathy. 3. Suggestion of a new 6 mm right lower lobe pulmonary nodule, below PET resolution, suspect an ipsilateral primary tumor lobe pulmonary  metastasis. 4. No extrathoracic hypermetabolic metastatic disease. 5. TNM Stage IIIa by PET-CT (T3 due to suspected ipsilateral primary tumor lobe metastasis N2 M0). 6. Additional findings including 2 vessel coronary atherosclerosis, mild diffuse hepatic steatosis and nonobstructing tiny left renal stones. Electronically Signed   By: Ilona Sorrel M.D.   On: 07/12/2015 10:56   Ir Fluoro Guide Cv Line Right  07/18/2015  CLINICAL DATA:  Lung carcinoma, needs venous access for chemotherapy EXAM: TUNNELED PORT CATHETER PLACEMENT WITH ULTRASOUND AND FLUOROSCOPIC GUIDANCE FLUOROSCOPY TIME:  0.4 minutes, 286  uGym2 DAP ANESTHESIA/SEDATION: Intravenous Fentanyl and Versed were administered as conscious sedation during continuous cardiorespiratory monitoring by the radiology RN, with a total moderate sedation time of 17 minutes. TECHNIQUE: The procedure, risks, benefits, and alternatives were explained to the patient. Questions regarding the procedure were encouraged and answered. The patient understands and consents to the procedure. As antibiotic prophylaxis, cefazolin 2 g was ordered pre-procedure and administered intravenously within one hour of incision. Patency of the right IJ vein was confirmed with ultrasound with image documentation. An appropriate skin site was determined. Skin site was marked. Region was prepped using maximum barrier technique including cap and mask, sterile gown, sterile gloves, large sterile sheet, and Chlorhexidine as cutaneous antisepsis. The region was infiltrated locally with 1% lidocaine.Under real-time ultrasound guidance, the right IJ vein was accessed with a 21 gauge micropuncture needle; the needle tip within the vein was confirmed with ultrasound image documentation. Needle was exchanged over a 018 guidewire for transitional dilator which allowed passage of the [facility] wire into the IVC. Over this, the transitional dilator was exchanged for a 5 [place] MPA catheter. A small incision  was made on the right anterior chest wall and a subcutaneous pocket fashioned. The power-injectable port was positioned and its catheter tunneled to the right IJ dermatotomy site. The MPA catheter was exchanged over an Amplatz wire for a peel-away sheath, through which the port catheter, which had been trimmed to the appropriate length, was advanced and positioned under fluoroscopy with its tip at the cavoatrial junction. Spot chest radiograph confirms good catheter position and no pneumothorax. The pocket was closed with deep interrupted and subcuticular continuous 3-0 Monocryl sutures. The port was flushed per protocol. The incisions were covered with Dermabond then covered with a sterile dressing. COMPLICATIONS: COMPLICATIONS None immediate IMPRESSION: Technically successful right IJ power-injectable port catheter placement. Ready for routine use. Electronically Signed   By: Lucrezia Europe M.D.   On: 07/18/2015 09:29   Ir US Guide Vasc Access Right  07/18/2015  CLINICAL DATA:  Lung carcinoma, needs venous access for chemotherapy EXAM: TUNNELED PORT CATHETER PLACEMENT WITH ULTRASOUND AND FLUOROSCOPIC GUIDANCE FLUOROSCOPY TIME:  0.4 minutes, 286  uGym2 DAP ANESTHESIA/SEDATION: Intravenous Fentanyl and Versed were administered as conscious sedation during continuous cardiorespiratory monitoring by the radiology RN, with a total moderate sedation time of 17 minutes. TECHNIQUE: The procedure, risks, benefits, and alternatives were explained to the patient. Questions regarding the procedure were encouraged and answered. The patient understands and consents to the procedure. As antibiotic prophylaxis, cefazolin 2 g was ordered pre-procedure and administered intravenously within one hour of incision. Patency of the right IJ vein was confirmed with ultrasound  with image documentation. An appropriate skin site was determined. Skin site was marked. Region was prepped using maximum barrier technique including cap and mask,  sterile gown, sterile gloves, large sterile sheet, and Chlorhexidine as cutaneous antisepsis. The region was infiltrated locally with 1% lidocaine.Under real-time ultrasound guidance, the right IJ vein was accessed with a 21 gauge micropuncture needle; the needle tip within the vein was confirmed with ultrasound image documentation. Needle was exchanged over a 018 guidewire for transitional dilator which allowed passage of the [facility] wire into the IVC. Over this, the transitional dilator was exchanged for a 5 [place] MPA catheter. A small incision was made on the right anterior chest wall and a subcutaneous pocket fashioned. The power-injectable port was positioned and its catheter tunneled to the right IJ dermatotomy site. The MPA catheter was exchanged over an Amplatz wire for a peel-away sheath, through which the port catheter, which had been trimmed to the appropriate length, was advanced and positioned under fluoroscopy with its tip at the cavoatrial junction. Spot chest radiograph confirms good catheter position and no pneumothorax. The pocket was closed with deep interrupted and subcuticular continuous 3-0 Monocryl sutures. The port was flushed per protocol. The incisions were covered with Dermabond then covered with a sterile dressing. COMPLICATIONS: COMPLICATIONS None immediate IMPRESSION: Technically successful right IJ power-injectable port catheter placement. Ready for routine use. Electronically Signed   By: Lucrezia Europe M.D.   On: 07/18/2015 09:29      Assessment & Plan  Principal Problem:   Intractable nausea and vomiting 2/2 Headache -Medical floor observation -Patient now reporting she is hungry and is requesting ice cream so will attempt a full liquid diet -Clinically appears volume depleted so will give IV fluids at low rate for 12 hours only given history of SIADH -Scheduled IV Zofran with as needed IV Phenergan for intractable nausea and vomiting -Decadron 4 mg IV every 6 hours for  headache-likely can rapidly taper down tomorrow  Active Problems:   SIADH with chronic hyponatremia -Baseline appears to be between 120 and 123 -Likely will need to resume fluid restriction in a.m. -Labs in a.m. -Previously etiology suspected related to psych meds but suspect underlying malignancy primary causative factor     Hypomagnesemia/Chronic hypokalemia -IV replete with 2 g magnesium-ordered by EDP -If able to tolerate diet tonight suggest begin magnesium 4 mg by mouth 3 times a day on 12/12 -Have utilized potassium in IV fluids -We'll attempt to give Kdur 40 mEq 1 now and repeat in 12 hours -Have increased preadmission potassium from 20 mEq daily to 40 mEq based on chronicity of hypokalemia/review of EMR    Chronic respiratory failure with hypoxia /COPD -Continue home oxygen -Continue preadmission nebs and MDIs -Compensated at this juncture although with some mild wheezing       Type 2 diabetes mellitus with hyperglycemia  -Resume preadmission Lantus and metformin -Provide SSI since expect CBGs may climb with use of Decadron    Mediastinal lymphadenopathy/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 Earlie Server of medical oncology and Dr. Lisbeth Renshaw of radiation oncology -Plan is to begin chemotherapy on December 20 as an outpatient    Hypertension -Currently moderately controlled -Was not on antihypertensive medications prior to admission    Bipolar I disorder ([place]) -Continue preadmission Ativan, Lamictal, Seroquel -Is somewhat confused but not agitated or manic    Hyperlipidemia -Continue statin    DVT Prophylaxis: Lovenox  Family Communication:  No family at bedside  Code Status:  Full code  Condition: Stable   Discharge disposition: Anticipate discharge in next 24-48 hours pending patient ability to tolerate solid diet and resolution of headache and intractable nausea and vomiting  Time spent in  minutes : 60      ELLIS,ALLISON L. ANP on 07/22/2015 at 9:59 AM  You may contact me by going to [email or web address] - password [email or web address]  I am available from 7a-7p but please confirm I am on the schedule by going to Amion as above.   After 7p please contact night coverage person covering me after hours  [facility]

## 2015-07-23 ENCOUNTER — Ambulatory Visit: Payer: Medicaid Other | Admitting: Radiation Oncology

## 2015-07-23 DIAGNOSIS — Z7984 Long term (current) use of oral hypoglycemic drugs: Secondary | ICD-10-CM | POA: Diagnosis not present

## 2015-07-23 DIAGNOSIS — E871 Hypo-osmolality and hyponatremia: Secondary | ICD-10-CM | POA: Diagnosis present

## 2015-07-23 DIAGNOSIS — Z7982 Long term (current) use of aspirin: Secondary | ICD-10-CM | POA: Diagnosis not present

## 2015-07-23 DIAGNOSIS — C3491 Malignant neoplasm of unspecified part of right bronchus or lung: Secondary | ICD-10-CM | POA: Diagnosis present

## 2015-07-23 DIAGNOSIS — R112 Nausea with vomiting, unspecified: Secondary | ICD-10-CM | POA: Diagnosis not present

## 2015-07-23 DIAGNOSIS — E876 Hypokalemia: Secondary | ICD-10-CM | POA: Diagnosis not present

## 2015-07-23 DIAGNOSIS — M549 Dorsalgia, unspecified: Secondary | ICD-10-CM | POA: Diagnosis present

## 2015-07-23 DIAGNOSIS — E860 Dehydration: Secondary | ICD-10-CM | POA: Diagnosis present

## 2015-07-23 DIAGNOSIS — F319 Bipolar disorder, unspecified: Secondary | ICD-10-CM

## 2015-07-23 DIAGNOSIS — Z9911 Dependence on respirator [ventilator] status: Secondary | ICD-10-CM | POA: Diagnosis not present

## 2015-07-23 DIAGNOSIS — F1410 Cocaine abuse, uncomplicated: Secondary | ICD-10-CM | POA: Diagnosis present

## 2015-07-23 DIAGNOSIS — I10 Essential (primary) hypertension: Secondary | ICD-10-CM | POA: Diagnosis present

## 2015-07-23 DIAGNOSIS — Z833 Family history of diabetes mellitus: Secondary | ICD-10-CM | POA: Diagnosis not present

## 2015-07-23 DIAGNOSIS — J449 Chronic obstructive pulmonary disease, unspecified: Secondary | ICD-10-CM | POA: Diagnosis present

## 2015-07-23 DIAGNOSIS — Z9049 Acquired absence of other specified parts of digestive tract: Secondary | ICD-10-CM | POA: Diagnosis not present

## 2015-07-23 DIAGNOSIS — R51 Headache: Secondary | ICD-10-CM | POA: Diagnosis present

## 2015-07-23 DIAGNOSIS — G43A1 Cyclical vomiting, intractable: Secondary | ICD-10-CM

## 2015-07-23 DIAGNOSIS — E785 Hyperlipidemia, unspecified: Secondary | ICD-10-CM | POA: Diagnosis present

## 2015-07-23 DIAGNOSIS — E1165 Type 2 diabetes mellitus with hyperglycemia: Secondary | ICD-10-CM | POA: Diagnosis present

## 2015-07-23 DIAGNOSIS — R590 Localized enlarged lymph nodes: Secondary | ICD-10-CM | POA: Diagnosis present

## 2015-07-23 DIAGNOSIS — J9611 Chronic respiratory failure with hypoxia: Secondary | ICD-10-CM | POA: Diagnosis present

## 2015-07-23 DIAGNOSIS — E669 Obesity, unspecified: Secondary | ICD-10-CM | POA: Diagnosis present

## 2015-07-23 DIAGNOSIS — Z9981 Dependence on supplemental oxygen: Secondary | ICD-10-CM | POA: Diagnosis not present

## 2015-07-23 DIAGNOSIS — Z809 Family history of malignant neoplasm, unspecified: Secondary | ICD-10-CM | POA: Diagnosis not present

## 2015-07-23 DIAGNOSIS — G8929 Other chronic pain: Secondary | ICD-10-CM | POA: Diagnosis present

## 2015-07-23 DIAGNOSIS — E869 Volume depletion, unspecified: Secondary | ICD-10-CM | POA: Diagnosis present

## 2015-07-23 DIAGNOSIS — F1010 Alcohol abuse, uncomplicated: Secondary | ICD-10-CM | POA: Diagnosis present

## 2015-07-23 DIAGNOSIS — F17200 Nicotine dependence, unspecified, uncomplicated: Secondary | ICD-10-CM | POA: Diagnosis present

## 2015-07-23 DIAGNOSIS — Z85118 Personal history of other malignant neoplasm of bronchus and lung: Secondary | ICD-10-CM | POA: Diagnosis not present

## 2015-07-23 DIAGNOSIS — Z794 Long term (current) use of insulin: Secondary | ICD-10-CM | POA: Diagnosis not present

## 2015-07-23 LAB — COMPREHENSIVE METABOLIC PANEL
ALK PHOS: 104 U/L (ref 38–126)
ALT: 20 U/L (ref 14–54)
AST: 37 U/L (ref 15–41)
Albumin: 3 g/dL — ABNORMAL LOW (ref 3.5–5.0)
Anion gap: 13 (ref 5–15)
BILIRUBIN TOTAL: 0.7 mg/dL (ref 0.3–1.2)
BUN: 6 mg/dL (ref 6–20)
CALCIUM: 9.5 mg/dL (ref 8.9–10.3)
CO2: 24 mmol/L (ref 22–32)
CREATININE: 0.4 mg/dL — AB (ref 0.44–1.00)
Chloride: 83 mmol/L — ABNORMAL LOW (ref 101–111)
Glucose, Bld: 200 mg/dL — ABNORMAL HIGH (ref 65–99)
Potassium: 3.8 mmol/L (ref 3.5–5.1)
Sodium: 120 mmol/L — ABNORMAL LOW (ref 135–145)
TOTAL PROTEIN: 6.3 g/dL — AB (ref 6.5–8.1)

## 2015-07-23 LAB — BASIC METABOLIC PANEL
ANION GAP: 11 (ref 5–15)
Anion gap: 12 (ref 5–15)
BUN: 8 mg/dL (ref 6–20)
BUN: 8 mg/dL (ref 6–20)
CALCIUM: 9.7 mg/dL (ref 8.9–10.3)
CALCIUM: 9.2 mg/dL (ref 8.9–10.3)
CO2: 26 mmol/L (ref 22–32)
CO2: 27 mmol/L (ref 22–32)
Chloride: 82 mmol/L — ABNORMAL LOW (ref 101–111)
Chloride: 84 mmol/L — ABNORMAL LOW (ref 101–111)
Creatinine, Ser: 0.59 mg/dL (ref 0.44–1.00)
Creatinine, Ser: 0.68 mg/dL (ref 0.44–1.00)
GFR calc Af Amer: >60 mL/min (ref >60)
GFR calc non Af Amer: >60 mL/min (ref >60)
GLUCOSE: 256 mg/dL — AB (ref 65–99)
Glucose, Bld: 258 mg/dL — ABNORMAL HIGH (ref 65–99)
POTASSIUM: 3.6 mmol/L (ref 3.5–5.1)
Potassium: 3.5 mmol/L (ref 3.5–5.1)
Sodium: 120 mmol/L — ABNORMAL LOW (ref 135–145)
Sodium: 122 mmol/L — ABNORMAL LOW (ref 135–145)

## 2015-07-23 LAB — GLUCOSE, CAPILLARY
GLUCOSE-CAPILLARY: 218 mg/dL — AB (ref 65–99)
GLUCOSE-CAPILLARY: 222 mg/dL — AB (ref 65–99)
GLUCOSE-CAPILLARY: 266 mg/dL — AB (ref 65–99)
Glucose-Capillary: 167 mg/dL — ABNORMAL HIGH (ref 65–99)
Glucose-Capillary: 226 mg/dL — ABNORMAL HIGH (ref 65–99)
Glucose-Capillary: 261 mg/dL — ABNORMAL HIGH (ref 65–99)

## 2015-07-23 LAB — RENAL FUNCTION PANEL
Albumin: 3.3 g/dL — ABNORMAL LOW (ref 3.5–5.0)
Anion gap: 11 (ref 5–15)
BUN: 8 mg/dL (ref 6–20)
CHLORIDE: 82 mmol/L — AB (ref 101–111)
CO2: 27 mmol/L (ref 22–32)
CREATININE: 0.56 mg/dL (ref 0.44–1.00)
Calcium: 9.6 mg/dL (ref 8.9–10.3)
GFR calc non Af Amer: >60 mL/min (ref >60)
GLUCOSE: 255 mg/dL — AB (ref 65–99)
Phosphorus: 3.1 mg/dL (ref 2.5–4.6)
Potassium: 3.6 mmol/L (ref 3.5–5.1)
SODIUM: 120 mmol/L — AB (ref 135–145)

## 2015-07-23 LAB — CBC
HEMATOCRIT: 33.8 % — AB (ref 36.0–46.0)
Hemoglobin: 12.5 g/dL (ref 12.0–15.0)
MCH: 30 pg (ref 26.0–34.0)
MCHC: 37 g/dL — ABNORMAL HIGH (ref 30.0–36.0)
MCV: 81.3 fL (ref 78.0–100.0)
PLATELETS: 305 10*3/uL (ref 150–400)
RBC: 4.16 MIL/uL (ref 3.87–5.11)
RDW: 13.3 % (ref 11.5–15.5)
WBC: 7.2 10*3/uL (ref 4.0–10.5)

## 2015-07-23 LAB — CORTISOL: CORTISOL PLASMA: 27.2 ug/dL

## 2015-07-23 LAB — MAGNESIUM: MAGNESIUM: 1.1 mg/dL — AB (ref 1.7–2.4)

## 2015-07-23 LAB — MRSA PCR SCREENING: MRSA by PCR: NEGATIVE

## 2015-07-23 LAB — PHOSPHORUS: Phosphorus: 4.3 mg/dL (ref 2.5–4.6)

## 2015-07-23 LAB — TSH: TSH: 0.398 u[IU]/mL (ref 0.350–4.500)

## 2015-07-23 LAB — HEMOGLOBIN A1C
HEMOGLOBIN A1C: 9.3 % — AB (ref 4.8–5.6)
MEAN PLASMA GLUCOSE: 220 mg/dL

## 2015-07-23 LAB — OSMOLALITY: Osmolality: 255 mOsm/kg — ABNORMAL LOW (ref 275–295)

## 2015-07-23 LAB — SODIUM, URINE, RANDOM: SODIUM UR: 41 mmol/L

## 2015-07-23 LAB — OSMOLALITY, URINE: OSMOLALITY UR: 341 mosm/kg (ref 300–900)

## 2015-07-23 MED ORDER — HYDRALAZINE HCL 20 MG/ML IJ SOLN
10.0000 mg | INTRAMUSCULAR | Status: DC | PRN
  Administered 2015-07-24: 10 mg via INTRAVENOUS
  Filled 2015-07-23: qty 1

## 2015-07-23 MED ORDER — POTASSIUM CHLORIDE CRYS ER 20 MEQ PO TBCR
40.0000 meq | EXTENDED_RELEASE_TABLET | Freq: Three times a day (TID) | ORAL | Status: DC
  Administered 2015-07-23 – 2015-07-24 (×4): 40 meq via ORAL
  Filled 2015-07-23 (×5): qty 2

## 2015-07-23 MED ORDER — TOLVAPTAN 15 MG PO TABS
15.0000 mg | ORAL_TABLET | ORAL | Status: DC
  Filled 2015-07-23 (×2): qty 1

## 2015-07-23 MED ORDER — SODIUM CHLORIDE 0.9 % IV SOLN
INTRAVENOUS | Status: DC
  Administered 2015-07-23 – 2015-07-25 (×3): via INTRAVENOUS
  Filled 2015-07-23 (×4): qty 1000

## 2015-07-23 MED ORDER — MAGNESIUM SULFATE 4 GM/100ML IV SOLN
4.0000 g | Freq: Once | INTRAVENOUS | Status: AC
  Administered 2015-07-23: 4 g via INTRAVENOUS
  Filled 2015-07-23: qty 100

## 2015-07-23 MED ORDER — ALBUTEROL SULFATE (2.5 MG/3ML) 0.083% IN NEBU: 2.5000 mg | INHALATION_SOLUTION | RESPIRATORY_TRACT | Status: DC | PRN

## 2015-07-23 NOTE — Progress Notes (Signed)
Inpatient Diabetes Program Recommendations  AACE/ADA: New Consensus Statement on Inpatient Glycemic Control (2015)  Target Ranges:  Prepandial:   less than 140 mg/dL      Peak postprandial:   less than 180 mg/dL (1-2 hours)      Critically ill patients:  140 - 180 mg/dL   Review of Glycemic Control  Diabetes history: DM 2 Outpatient Diabetes medications: Lantus 10, Novolog 5 units TID, Metformin 1,000 mg BID Current orders for Inpatient glycemic control: Lantus 10, Novolog Moderate Q4hrs, Decadron 4 mg 4x/day  Inpatient Diabetes Program Recommendations: Insulin - Basal: While on steroids, please consider increasing basal insulin to 14 units QHS. Insulin - Meal Coverage: Patient takes Novolog 5 units TID meal coverage at home. Please consider starting Novolog 3 units TID meal coverage in addition to correction scale.  Thanks,  Tama Headings RN, MSN, [facility] Inpatient Diabetes Coordinator Team Pager [phone] (8a-5p)

## 2015-07-23 NOTE — Consult Note (Signed)
Reason for Consult:  Hyponatremia Referring Physician: Allyson Sabal, MD  Sarah Leonard is an 54 y.o. female.  HPI: Pt is a 54yo WF with PMH sig for small cell lung cancer, initially limited stage but now found to be stage 3 with mets to RML and peribronchial/subcarinal, paratracheal lymphadenopathy (ipsilateral primary tumor lobe pulmonary metastasis) who is scheduled to start chemoradiation starting 07/25/15, however she presented to [facility] with N/V/D and Headaches.   She was noted to have acute exacerbation of her chronic hyponatremia and we were asked to evaluate her again.  She was seen in consultation in November for similar presentation and at that time SIADH was suspected, however her small cell lung cancer metastasis had not been diagnosed at that time.  She was treated with Samsca for a period of time and then fluid restriction with stabilization of serum sodium levels until this hospitalization.  Her sodium levels have been ranging 121-123 over the last month but has dropped to 118 once since her discharge but have remained above the 114 at the presentation of her last admission.  To further complicate the picture, she suffers from Bipolar disorder and is on some psychotropic meds that can exacerbate hyponatremia.  This presentation, however she has clinical history supportive of hypovolemic hyponatremia with one day of N/V and decreased po intake.  She was started on NS IV and labs appropriately ordered, however she has not had her labs drawn since starting the IVF's this morning.  She feels better and denies any recent N/V/D.   Trend in Sodium: SODIUM  Date/Time Value Ref Range Status  07/23/2015 07:45 AM 120* 135 - 145 mmol/L Final  07/22/2015 02:15 PM 118* 135 - 145 mmol/L Final  07/22/2015 06:47 AM 120* 135 - 145 mmol/L Final  07/18/2015 10:17 AM 121* 136 - 145 mEq/L Final  07/17/2015 12:25 PM 123* 135 - 145 mmol/L Final  07/10/2015 11:02 AM 118* 136 - 145 mEq/L Final  07/03/2015 01:34 PM  121* 136 - 145 mEq/L Final  06/20/2015 03:40 AM 132* 135 - 145 mmol/L Final  06/19/2015 05:57 AM 131* 135 - 145 mmol/L Final  06/18/2015 08:20 PM 129* 135 - 145 mmol/L Final  06/18/2015 01:25 PM 125* 135 - 145 mmol/L Final  06/18/2015 11:10 AM 124* 135 - 145 mmol/L Final  06/17/2015 06:56 PM 114* 135 - 145 mmol/L Final  06/17/2015 06:50 AM 116* 135 - 145 mmol/L Final  06/17/2015 02:00 AM 115* 135 - 145 mmol/L Final  06/11/2015 07:15 AM 126* 135 - 145 mmol/L Final  06/10/2015 11:33 AM 127* 135 - 145 mmol/L Final  06/10/2015 06:05 AM 126* 135 - 145 mmol/L Final  06/10/2015 01:52 AM 127* 135 - 145 mmol/L Final  06/09/2015 09:58 PM 124* 135 - 145 mmol/L Final  06/09/2015 06:49 PM 124* 135 - 145 mmol/L Final  06/09/2015 02:30 PM 121* 135 - 145 mmol/L Final  06/09/2015 09:52 AM 122* 135 - 145 mmol/L Final  06/09/2015 05:59 AM 118* 135 - 145 mmol/L Final  06/09/2015 02:57 AM 120* 135 - 145 mmol/L Final  06/08/2015 10:32 PM 121* 135 - 145 mmol/L Final  06/08/2015 07:55 PM 122* 135 - 145 mmol/L Final  06/08/2015 02:11 PM 121* 135 - 145 mmol/L Final  06/08/2015 07:44 AM 125* 135 - 145 mmol/L Final  06/08/2015 05:23 AM 120* 135 - 145 mmol/L Final  06/07/2015 10:37 PM 120* 135 - 145 mmol/L Final  06/07/2015 06:13 PM 118* 135 - 145 mmol/L Final  06/07/2015 12:29 PM 116* 135 -  145 mmol/L Final  06/07/2015 03:43 AM 115* 135 - 145 mmol/L Final  06/06/2015 11:50 PM 114* 135 - 145 mmol/L Final  06/06/2015 06:30 PM 114* 135 - 145 mmol/L Final  06/06/2015 11:05 AM 117* 135 - 145 mmol/L Final  06/06/2015 07:38 AM 116* 135 - 145 mmol/L Final  06/06/2015 04:24 AM 116* 135 - 145 mmol/L Final  06/05/2015 08:50 PM 117* 135 - 145 mmol/L Final  05/20/2015 02:49 AM 123* 135 - 145 mmol/L Final  11/26/2014 03:23 AM 132* 135 - 145 mmol/L Final  11/24/2014 11:45 PM 133* 135 - 145 mmol/L Final  11/20/2014 12:25 PM 133* 135 - 145 mmol/L Final  03/10/2013 06:26 PM 138 135 - 145 mEq/L Final  03/05/2013 04:25 AM  134* 135 - 145 mEq/L Final  03/04/2013 06:30 AM 133* 135 - 145 mEq/L Final  03/02/2013 05:20 AM 133* 135 - 145 mEq/L Final  03/01/2013 04:50 AM 133* 135 - 145 mEq/L Final  02/28/2013 08:00 PM 128* 135 - 145 mEq/L Final  02/28/2013 09:28 AM 127* 135 - 145 mEq/L Final  12/13/2012 01:48 PM 139 135 - 145 mEq/L Final  08/21/2012 08:19 PM 138 135 - 145 mEq/L Final  07/26/2012 03:05 PM 137 135 - 145 mEq/L Final  03/25/2012 05:55 PM 136 135 - 145 mEq/L Final   PMH:   Past Medical History  Diagnosis Date  . Diabetes mellitus   . Pancreatitis   . Anemia   . Bipolar 1 disorder ([place])   . Alcohol abuse   . 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 COPD (chronic obstructive pulmonary disease) ([place])   . Hypotension   . Major depressive disorder ([place])   . Cocaine abuse   . Chronic back pain   . Renal disorder   . Acidosis   . Hyperkalemia   . Abscess   . Acute encephalopathy   . Septic shock ([facility])     PSH:   Past Surgical History  Procedure Laterality Date  . Cholecystectomy    . Appendectomy    . Tonsillectomy    . Incision and drainage abscess Right 03/01/2013    Procedure: INCISION AND DRAINAGE ABSCESS;  Surgeon: Harl Bowie, MD;  Location: [facility];  Service: General;  Laterality: Right;  . Cesarean section    . Video bronchoscopy with endobronchial ultrasound N/A 06/21/2015    Procedure: VIDEO BRONCHOSCOPY WITH ENDOBRONCHIAL ULTRASOUND;  Surgeon: Javier Glazier, MD;  Location: [facility];  Service: Thoracic;  Laterality: N/A;  . Flexible bronchoscopy N/A 06/21/2015    Procedure: BRONCHOSCOPY;  Surgeon: Javier Glazier, MD;  Location: [facility] OR;  Service: Thoracic;  Laterality: N/A;    Allergies: No Known Allergies  Medications:   Prior to Admission medications   Medication Sig Start Date End Date Taking? Authorizing Provider  acetaminophen (TYLENOL) 325 MG tablet Take 325 mg by mouth 3 (three) times daily. Prior to fish oil   Yes Historical Provider, MD   albuterol (PROVENTIL HFA;VENTOLIN HFA) 108 (90 BASE) MCG/ACT inhaler Inhale 2 puffs into the lungs every 4 (four) hours as needed for wheezing or shortness of breath. 26/8/34  Yes Delora Fuel, MD  aspirin EC 81 MG tablet Take 1 tablet (81 mg total) by mouth daily. 06/25/15  Yes Velvet Bathe, MD  budesonide-formoterol (SYMBICORT) 80-4.5 MCG/ACT inhaler Inhale 2 puffs into the lungs 2 (two) times daily.   Yes Historical Provider, MD  calcium-vitamin D (OSCAL WITH D) 500-200 MG-UNIT tablet Take 1 tablet  by mouth 2 (two) times daily.   Yes Historical Provider, MD  cholecalciferol (VITAMIN D) 1000 UNITS tablet Take 1,000 Units by mouth daily.   Yes Historical Provider, MD  docusate sodium (COLACE) 100 MG capsule Take 100 mg by mouth 2 (two) times daily.   Yes Historical Provider, MD  gabapentin (NEURONTIN) 300 MG capsule Take 600 mg by mouth 3 (three) times daily.   Yes Historical Provider, MD  insulin aspart (NOVOLOG) 100 UNIT/ML injection Inject 5 Units into the skin 3 (three) times daily with meals. 06/11/15  Yes Orson Eva, MD  insulin glargine (LANTUS) 100 UNIT/ML injection Inject 0.1 mLs (10 Units total) into the skin at bedtime. 06/11/15  Yes David Tat, MD  ipratropium-albuterol (DUONEB) 0.5-2.5 (3) MG/3ML SOLN Take 3 mLs by nebulization every 6 (six) hours as needed. 06/11/15  Yes Orson Eva, MD  lamoTRIgine (LAMICTAL) 100 MG tablet Take 100 mg by mouth 2 (two) times daily. Take with 25 mg tablet   Yes Historical Provider, MD  lamoTRIgine (LAMICTAL) 25 MG tablet Take 25 mg by mouth 2 (two) times daily. Takes with '100mg'$  tablet   Yes Historical Provider, MD  lidocaine-prilocaine (EMLA) cream Apply 1 application topically as needed. Apply 1 hr prior to chemotherapy 07/18/15 07/17/16 Yes Coralee Pesa Wertman, PA-C  LORazepam (ATIVAN) 0.5 MG tablet Take 1 tablet (0.5 mg total) by mouth every 8 (eight) hours as needed for anxiety. 11/29/14  Yes Nishant Dhungel, MD  metFORMIN (GLUCOPHAGE) 1000 MG tablet Take 1  tablet (1,000 mg total) by mouth 2 (two) times daily with a meal. 11/29/14  Yes Nishant Dhungel, MD  niacin (NIASPAN) 750 MG CR tablet Take 750 mg by mouth at bedtime.   Yes Historical Provider, MD  omeprazole (PRILOSEC) 20 MG capsule Take 20 mg by mouth daily before supper.    Yes Historical Provider, MD  oxyCODONE-acetaminophen (PERCOCET/ROXICET) 5-325 MG tablet Take 1 tablet by mouth every 8 (eight) hours as needed for severe pain. 07/03/15  Yes Curt Bears, MD  promethazine (PHENERGAN) 12.5 MG tablet Take 12.5 mg by mouth every 6 (six) hours as needed for nausea or vomiting.   Yes Historical Provider, MD  QUEtiapine (SEROQUEL XR) 400 MG 24 hr tablet Take 800 mg by mouth at bedtime.   Yes Historical Provider, MD  simvastatin (ZOCOR) 40 MG tablet Take 40 mg by mouth daily at 6 PM.    Yes Historical Provider, MD  tiotropium (SPIRIVA) 18 MCG inhalation capsule Place 18 mcg into inhaler and inhale daily.   Yes Historical Provider, MD  zolpidem (AMBIEN) 10 MG tablet Take 5 mg by mouth at bedtime.    Yes Historical Provider, MD    Discontinued Meds:   Medications Discontinued During This Encounter  Medication Reason  . 0.9 % NaCl with KCl 40 mEq / L  infusion   . potassium chloride SA (K-DUR,KLOR-CON) CR tablet 40 mEq   . metFORMIN (GLUCOPHAGE) tablet 1,000 mg   . potassium chloride (K-DUR) 10 MEQ tablet Patient has not taken in last 30 days  . prochlorperazine (COMPAZINE) 10 MG tablet Patient has not taken in last 30 days  . 0.9 % NaCl with KCl 40 mEq / L  infusion   . albuterol (PROVENTIL HFA;VENTOLIN HFA) 108 (90 BASE) MCG/ACT inhaler 2 puff   . potassium chloride SA (K-DUR,KLOR-CON) CR tablet 40 mEq     Social History:  reports that she quit smoking 8 days ago. She has never used smokeless tobacco. She reports that she uses illicit  drugs (Cocaine). She reports that she does not drink alcohol.  Family History:   Family History  Problem Relation Age of Onset  . Diabetes Mother   .  Cancer Mother     breast    Pertinent items are noted in HPI.  Blood pressure 161/99, pulse 89, temperature 97.4 F (36.3 C), temperature source Oral, resp. rate 16, height '5\' 3"'$  (1.6 m), weight 93.849 kg (206 lb 14.4 oz), SpO2 96 %. General appearance: alert, cooperative and mildly obese Head: Normocephalic, without obvious abnormality, atraumatic Eyes: negative findings: lids and lashes normal, conjunctivae and sclerae normal and corneas clear Neck: no adenopathy, no carotid bruit, supple, symmetrical, trachea midline and thyroid not enlarged, symmetric, no tenderness/mass/nodules Resp: scattered rhonchi and occassional expiratory wheezes bilaterally Cardio: regular rate and rhythm and no rub GI: soft, non-tender; bowel sounds normal; no masses,  no organomegaly Extremities: edema trace pedal edema  Labs: Basic Metabolic Panel:  Recent Labs Lab 07/17/15 1225 07/18/15 1017 07/22/15 0647 07/22/15 1415 07/23/15 0745  NA 123* 121* 120* 118* 120*  K 3.1* 2.7* 2.6* 3.1* 3.8  CL 81*  --  78* 80* 83*  CO2 '29 24 28 26 24  '$ GLUCOSE 182* 215* 208* 216* 200*  BUN 6 4.1* <5* <5* 6  CREATININE 0.42* 0.7 0.53 0.42* 0.40*  ALBUMIN  --  3.3* 3.3*  --  3.0*  CALCIUM 9.1 9.0 9.6 9.0 9.5  PHOS  --   --   --   --  4.3   Liver Function Tests:  Recent Labs Lab 07/18/15 1017 07/22/15 0647 07/23/15 0745  AST 19 34 37  ALT '13 22 20  '$ ALKPHOS 97 104 104  BILITOT 0.34 0.6 0.7  PROT 6.7 7.0 6.3*  ALBUMIN 3.3* 3.3* 3.0*   No results for input(s): LIPASE, AMYLASE in the last 168 hours. No results for input(s): AMMONIA in the last 168 hours. CBC:  Recent Labs Lab 07/17/15 1225 07/18/15 1017 07/22/15 0647 07/23/15 0745  WBC 7.1 5.6 8.1 7.2  NEUTROABS 5.3 3.6  --   --   HGB 11.9* 12.0 12.7 12.5  HCT 33.4* 34.7* 35.8* 33.8*  MCV 83.9 85.9 83.3 81.3  PLT 275 277 313 305   PT/INR: '@labrcntip'$ (inr:5) Cardiac Enzymes: No results for input(s): CKTOTAL, CKMB, CKMBINDEX, TROPONINI in the  last 168 hours. CBG:  Recent Labs Lab 07/22/15 2023 07/23/15 0002 07/23/15 0438 07/23/15 0804 07/23/15 1219  GLUCAP 183* 226* 167* 218* 222*    Iron Studies: No results for input(s): IRON, TIBC, TRANSFERRIN, FERRITIN in the last 168 hours.  Xrays/Other Studies: Ct Head Wo Contrast  07/22/2015  CLINICAL DATA:  Small cell lung carcinoma.  Headache. EXAM: CT HEAD WITHOUT CONTRAST TECHNIQUE: Contiguous axial images were obtained from the base of the skull through the vertex without intravenous contrast. COMPARISON:  Head CT June 06, 2015; brain MRI June 26, 2015 FINDINGS: The ventricles are normal in size and configuration. There is no intracranial mass, hemorrhage, extra-axial fluid collection, or midline shift. There is mild small vessel disease in the centra semiovale bilaterally. There is no new gray-white compartment lesion. No acute infarct evident. The bony calvarium appears intact. There is patchy mastoid air cell disease on the left, stable. Mastoids on the right are clear. There is apparent cerumen in the right external auditory canal, a finding also present previously. There is a retention cyst in the posterior left maxillary antrum. There is ethmoid sinus mucosal thickening bilaterally. No intraorbital lesions are identified. IMPRESSION: Mild periventricular small  vessel disease. No intracranial mass, hemorrhage, or extra-axial fluid collection. 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 Cerumen right external auditory canal. Areas of paranasal sinus disease as noted above. Electronically Signed   By: Lowella Grip III M.D.   On: 07/22/2015 08:05     Assessment/Plan: 1.  Hyponatremia- has history consistent with hypovolemia however also has small cell lung cancer and h/o SIADH.  To further complicate picture, she has trace pedal edema.  Agree with IVF's but need to repeat sodium levels STAT as they have been running for several hours and labs were ordered but not drawn.  Hold off  on tolvaptan until repeat labs performed as she may not need it.  Will also repeat Uosm/Sosm studies and follow closely. 1. 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 2. Continue to follow sodium levels 3. Currently asymptomatic 2. Small cell lung cancer- stage 3 for chemoradiation later this week per Dr. Julien Nordmann.   3. DM- per primary svc 4. COPD- stable 5. Bipolar disorder- continue with meds 6. Obesity 7. HTN- stable   Marrio Scribner A 07/23/2015, 1:50 PM

## 2015-07-23 NOTE — Care Management Note (Signed)
Case Management Note  Patient Details  Name: Sarah Leonard MRN: [id number] Date of Birth: [birth year]  Subjective/Objective:                  Date-07-23-15 Initial Assessment Spoke with patient at the bedside.  Introduced self as Tourist information centre manager and explained role in discharge planning and how to be reached.  Verified patient lives at [place] Verified patient anticipates to go home alone to ALF at time of discharge and will have part-time supervision.  Patient has DME walker that is borrowed oxygen through [facility]. Expressed potential need for rolator and tub bench.  Patient denied  needing help with their medication.  Patient is driven by ALF to MD appointments.  Verified patient has PCP. Patient states they currently receive [place] services through no one.  Patient was provided choice and selected [facility] for home health needs if they arise.  Plan: CM will continue to follow for discharge planning and [facility] resources.   Carles Collet RN BSN CM [phone]   Action/Plan:  CM placed order for rolator and tub bench, referral made to [facility]. It will be ordered and mailed to [facility].  Expected Discharge Date:  07/25/15               Expected Discharge Plan:  Assisted Living / Rest Home ([facility])  In-House Referral:  Clinical Social Work  Discharge planning Services  CM Consult  Post Acute Care Choice:  Durable Medical Equipment Choice offered to:  Patient  DME Arranged:  Walker rolling with seat, Tub bench DME Agency:  [place]:    [place] Agency:     Status of Service:  In process, will continue to follow  Medicare Important Message Given:    Date Medicare IM Given:    Medicare IM give by:    Date Additional Medicare IM Given:    Additional Medicare Important Message give by:     If discussed at [place] of Stay Meetings, dates discussed:    Additional Comments:  Carles Collet, RN 07/23/2015, 4:08 PM

## 2015-07-23 NOTE — Progress Notes (Signed)
[facility] PROGRESS NOTE  Sarah Leonard DJM:[id number] DOB: October 30, 1960 DOA: 07/22/2015 PCP: Moreen Fowler  Length of stay:    Assessment/Plan: Principal Problem:   Intractable nausea and vomiting Active Problems:   Hypertension   COPD (chronic obstructive pulmonary disease) ([place])   Headache   Chronic respiratory failure with hypoxia (HCC)   Bipolar I disorder (HCC)   Type 2 diabetes mellitus with hyperglycemia (HCC)   Hyperlipidemia   SIADH (syndrome of inappropriate ADH production) (HCC)   Hypomagnesemia   Mediastinal lymphadenopathy   Malignant neoplasm of lower lobe of right lung /Stage IIIA (T1a, N2, M0)   Chronic hypokalemia    HPI This is a 54 year old female patient recently diagnosed with Stage IIIA (T1a, N2, 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 She has been evaluated as an outpatient by radiation oncology as well as Dr. Julien Nordmann with medical oncology and plans are to start chemotherapy next week. She has multiple chronic electrolyte abnormalities including chronic hyponatremia and hypochloremia in the setting of diagnosed SIADH, chronic hypokalemia and chronic hypomagnesemia. She was last discharged on 11/11 to assisted living facility after an admission for nausea vomiting and diarrhea with worsening of her underlying hyponatremia. 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 At that time she did not carry a diagnosis of pulmonary carcinoma. She was given a one-time dose of Samsca with improvement in her sodium to 131 and at time of discharge her baseline sodium was documented as being in the 120s. Since discharge her sodium has averaged between 118 and 123. She returns to the ER today in the setting of headache with associated intractable nausea  and vomiting. Patient reports a history of migraines in the past. She primarily describes this headache as being on the "top" of her head although by my evaluation this discomfort had migrated to the left periorbital agent. She's not had any fevers, no neck stiffness or rigidity, no recent falls or traumas, no visual changes or focal neurological deficits, no change in functional ability, sinus infection type symptoms including drainage or pain. She reports she has been having excessive episodes of vomiting for at least 4-5 days. She's not had any GI symptoms or abdominal pain and no diarrhea. She has not started chemotherapy.  In the ER the patient was afebrile, somewhat mildly hypertensive with a blood pressure of 175/92, pulse was 94, respirations 24 room air saturations were 95%. Patient reports she wears nasal cannula oxygen at home and subsequently 3 L oxygen was applied. Noncontrasted CT the head was completed and was unremarkable. Patient did undergo MRI of the brain with and without contrast on 11/15 during previous admission and this did not demonstrate any areas of metastatic disease. Sodium was 120, potassium lower than usual at 2.6 with typical ranges since discharge between 3.1 and 3.2 on low-dose potassium supplementation. BUN was less than 5 chloride low at 78, CBC normal, magnesium very low at 0.9 with previous readings ranging between 1.0 when last checked November 2016. Glucose somewhat elevated at 208.  Upon my evaluation of the patient she reports her nausea has resolved and she is requesting ice cream to eat.  Assessment and plan Intractable nausea and vomiting 2/2 Headache Could be related to hyponatremia, improved Start patient on normal saline -Clinically appears  volume depleted so will continue IV fluids, history of SIADH -Scheduled IV Zofran with as needed IV Phenergan for intractable nausea and vomiting -Decadron 4 mg IV every 6 hours for will discontinue now   :  SIADH  with chronic hyponatremia -Baseline appears to be between 120 and 123, intermittently low since 2014-Likely will need to resume continue fluid restriction BMP every 8 hours -Previously etiology suspected related to psych meds but suspect underlying malignancy primary causative factor  Pharmacy review psych medications, not contributing to hyponatremia at this point   Hypomagnesemia/Chronic hypokalemia -IV replete with 2 g magnesium 1.1, replete Monitor closely -Replete potassium   Chronic respiratory failure with hypoxia /COPD -Continue home oxygen -Continue preadmission nebs and MDIs -Compensated at this juncture although with some mild wheezing     Type 2 diabetes mellitus with hyperglycemia  Continue Lantus and hold metformin -Provide SSI since expect CBGs may climb with use of Decadron   Mediastinal lymphadenopathy/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 Earlie Server of medical oncology and Dr. Lisbeth Renshaw of radiation oncology -Plan is to begin chemotherapy on December 20 as an outpatient   Hypertension -Currently moderately controlled -Was not on antihypertensive medications prior to admission   Bipolar I disorder ([place]) -Continue preadmission Ativan, Lamictal, Seroquel -Is somewhat confused but not agitated or manic   Hyperlipidemia -Continue statin     DVT prophylaxsis   Code Status:      Code Status Orders        Start     Ordered   07/22/15 1126  Full code   Continuous     07/22/15 1125      Family Communication: Discussed in detail with the patient, all imaging results, lab results explained to the patient   Disposition Plan:  As above       Consultants:  Nephrology  Procedures:  None  Antibiotics: Anti-infectives    None         HPI/Subjective: Patient awake alert oriented, sitting comfortably in the chair, feels better today, headache and nausea have  resolved  Objective: Filed Vitals:   07/23/15 0235 07/23/15 0441 07/23/15 0802 07/23/15 1314  BP:  153/74  161/99  Pulse:  88  89  Temp:  98.1 F (36.7 C)  97.4 F (36.3 C)  TempSrc:  Oral  Oral  Resp:  18  16  Height:      Weight:      SpO2: 94% 98% 95% 96%    Intake/Output Summary (Last 24 hours) at 07/23/15 1430 Last data filed at 07/23/15 4235  Gross per 24 hour  Intake    240 ml  Output   1450 ml  Net  -1210 ml    Exam:  General: No acute respiratory distress Lungs: Clear to auscultation bilaterally without wheezes or crackles Cardiovascular: Regular rate and rhythm without murmur gallop or rub normal S1 and S2 Abdomen: Nontender, nondistended, soft, bowel sounds positive, no rebound, no ascites, no appreciable mass Extremities: No significant cyanosis, clubbing, or edema bilateral lower extremities     Data Review   Micro Results Recent Results (from the past 240 hour(s))  MRSA PCR Screening     Status: None   Collection Time: 07/23/15  1:02 AM  Result Value Ref Range Status   MRSA by PCR NEGATIVE NEGATIVE Final    Comment:        The GeneXpert MRSA Assay (FDA approved for NASAL specimens only), is  one component of a comprehensive MRSA colonization surveillance program. It is not intended to diagnose MRSA infection nor to guide or monitor treatment for MRSA infections.     Radiology Reports Ct Head Wo Contrast  07/22/2015  CLINICAL DATA:  Small cell lung carcinoma.  Headache. EXAM: CT HEAD WITHOUT CONTRAST TECHNIQUE: Contiguous axial images were obtained from the base of the skull through the vertex without intravenous contrast. COMPARISON:  Head CT June 06, 2015; brain MRI June 26, 2015 FINDINGS: The ventricles are normal in size and configuration. There is no intracranial mass, hemorrhage, extra-axial fluid collection, or midline shift. There is mild small vessel disease in the centra semiovale bilaterally. There is no new gray-white  compartment lesion. No acute infarct evident. The bony calvarium appears intact. There is patchy mastoid air cell disease on the left, stable. Mastoids on the right are clear. There is apparent cerumen in the right external auditory canal, a finding also present previously. There is a retention cyst in the posterior left maxillary antrum. There is ethmoid sinus mucosal thickening bilaterally. No intraorbital lesions are identified. IMPRESSION: Mild periventricular small vessel disease. No intracranial mass, hemorrhage, or extra-axial fluid collection. 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 Cerumen right external auditory canal. Areas of paranasal sinus disease as noted above. Electronically Signed   By: Lowella Grip III M.D.   On: 07/22/2015 08:05   Mr Jeri Cos VH Contrast  06/26/2015  CLINICAL DATA:  History of small cell lung cancer. History of diabetes, cocaine abuse, abscess, septic shock. EXAM: MRI HEAD WITHOUT AND WITH CONTRAST TECHNIQUE: Multiplanar, multiecho pulse sequences of the brain and surrounding structures were obtained without and with intravenous contrast. CONTRAST:  12m MULTIHANCE GADOBENATE DIMEGLUMINE 529 MG/ML IV SOLN COMPARISON:  CT head June 06, 2015 FINDINGS: Mild motion degraded examination. The ventricles and sulci are normal for patient's age. No abnormal parenchymal signal, mass lesions, mass effect. Patchy supratentorial white matter T2 hyperintensities. No abnormal parenchymal enhancement. No reduced diffusion to suggest acute ischemia. No susceptibility artifact to suggest hemorrhage. No abnormal extra-axial fluid collections. No extra-axial masses nor leptomeningeal enhancement. Normal major intracranial vascular flow voids seen at the skull base. Ocular globes and orbital contents are unremarkable though not tailored for evaluation. No suspicious calvarial bone marrow signal. No abnormal sellar expansion. Craniocervical junction maintained. LEFT maxillary sinus air-fluid  level. Small LEFT mastoid effusion. IMPRESSION: Mild motion degraded examination without acute intracranial process nor MR findings of intracranial metastasis. Mild to moderate white matter changes most often seen with chronic small vessel ischemic disease. Electronically Signed   By: CElon AlasM.D.   On: 06/26/2015 22:09   Nm Pet Image Initial (pi) Skull Base To Thigh  07/12/2015  CLINICAL DATA:  Initial treatment strategy for newly diagnosed small-cell lung carcinoma based on EBUS FNA of a subcarinal lymph node on 06/21/2015. EXAM: NUCLEAR MEDICINE PET SKULL BASE TO THIGH TECHNIQUE: 12.3 mCi F-18 FDG was injected intravenously. Full-ring PET imaging was performed from the skull base to thigh after the radiotracer. CT data was obtained and used for attenuation correction and anatomic localization. FASTING BLOOD GLUCOSE:  Value: 224 mg/dl COMPARISON:  06/19/2015 chest CT.  06/17/2015 CT abdomen/ pelvis. FINDINGS: NECK No hypermetabolic lymph nodes in the neck. Mucous retention cyst versus polyp in the inferior posterior left maxillary sinus. CHEST There is a hypermetabolic 2.2 x 2.1 cm posterior right lower lobe pulmonary nodule (series 3/image 118) with max SUV 4.0, increased in size from 1.5 x 1.3 cm on 06/19/2015. There  is a suggestion of a new 0.6 cm anterior right lower lobe pulmonary nodule (3/123), below PET resolution. No acute consolidative airspace disease. Mosaic attenuation throughout the lungs. There are multiple hypermetabolic ipsilateral right peribronchial nodes, largest 2.0 cm (series 3/image 90) with max SUV 10.2. There is bulky subcarinal hypermetabolic adenopathy measuring 3.6 cm (series 3/image 93) with max SUV 11.2. There is a mildly hypermetabolic and mildly enlarged right lower paratracheal 1.1 cm node (series 3/image 87) with max SUV 4.3. No contralateral mediastinal, contralateral hilar or supraclavicular hypermetabolic adenopathy. No hypermetabolic axillary nodes. Left anterior  descending and right coronary atherosclerosis. Mitral annular calcifications. ABDOMEN/PELVIS No abnormal hypermetabolic activity within the liver, pancreas, adrenal glands, or spleen. No hypermetabolic lymph nodes in the abdomen or pelvis. Mild diffuse hepatic steatosis. Status post cholecystectomy. Two clustered 3 mm nonobstructing stones in the lower left kidney. Malrotated left kidney. Mild fullness of the left renal collecting system without overt hydronephrosis. SKELETON No focal hypermetabolic activity to suggest skeletal metastasis. IMPRESSION: 1. Hypermetabolic 2.2 cm right lower lobe pulmonary nodule, in keeping with a primary bronchogenic carcinoma, which has grown since 06/19/2015. 2. Hypermetabolic ipsilateral peribronchial, subcarinal and ipsilateral paratracheal lymphadenopathy. 3. Suggestion of a new 6 mm right lower lobe pulmonary nodule, below PET resolution, suspect an ipsilateral primary tumor lobe pulmonary metastasis. 4. No extrathoracic hypermetabolic metastatic disease. 5. TNM Stage IIIa by PET-CT (T3 due to suspected ipsilateral primary tumor lobe metastasis N2 M0). 6. Additional findings including 2 vessel coronary atherosclerosis, mild diffuse hepatic steatosis and nonobstructing tiny left renal stones. Electronically Signed   By: Ilona Sorrel M.D.   On: 07/12/2015 10:56   Ir Fluoro Guide Cv Line Right  07/18/2015  CLINICAL DATA:  Lung carcinoma, needs venous access for chemotherapy EXAM: TUNNELED PORT CATHETER PLACEMENT WITH ULTRASOUND AND FLUOROSCOPIC GUIDANCE FLUOROSCOPY TIME:  0.4 minutes, 286  uGym2 DAP ANESTHESIA/SEDATION: Intravenous Fentanyl and Versed were administered as conscious sedation during continuous cardiorespiratory monitoring by the radiology RN, with a total moderate sedation time of 17 minutes. TECHNIQUE: The procedure, risks, benefits, and alternatives were explained to the patient. Questions regarding the procedure were encouraged and answered. The patient  understands and consents to the procedure. As antibiotic prophylaxis, cefazolin 2 g was ordered pre-procedure and administered intravenously within one hour of incision. Patency of the right IJ vein was confirmed with ultrasound with image documentation. An appropriate skin site was determined. Skin site was marked. Region was prepped using maximum barrier technique including cap and mask, sterile gown, sterile gloves, large sterile sheet, and Chlorhexidine as cutaneous antisepsis. The region was infiltrated locally with 1% lidocaine.Under real-time ultrasound guidance, the right IJ vein was accessed with a 21 gauge micropuncture needle; the needle tip within the vein was confirmed with ultrasound image documentation. Needle was exchanged over a 018 guidewire for transitional dilator which allowed passage of the [facility] wire into the IVC. Over this, the transitional dilator was exchanged for a 5 [place] MPA catheter. A small incision was made on the right anterior chest wall and a subcutaneous pocket fashioned. The power-injectable port was positioned and its catheter tunneled to the right IJ dermatotomy site. The MPA catheter was exchanged over an Amplatz wire for a peel-away sheath, through which the port catheter, which had been trimmed to the appropriate length, was advanced and positioned under fluoroscopy with its tip at the cavoatrial junction. Spot chest radiograph confirms good catheter position and no pneumothorax. The pocket was closed with deep interrupted and subcuticular continuous 3-0 Monocryl sutures. The  port was flushed per protocol. The incisions were covered with Dermabond then covered with a sterile dressing. COMPLICATIONS: COMPLICATIONS None immediate IMPRESSION: Technically successful right IJ power-injectable port catheter placement. Ready for routine use. Electronically Signed   By: Lucrezia Europe M.D.   On: 07/18/2015 09:29   Ir US Guide Vasc Access Right  07/18/2015  CLINICAL DATA:  Lung  carcinoma, needs venous access for chemotherapy EXAM: TUNNELED PORT CATHETER PLACEMENT WITH ULTRASOUND AND FLUOROSCOPIC GUIDANCE FLUOROSCOPY TIME:  0.4 minutes, 286  uGym2 DAP ANESTHESIA/SEDATION: Intravenous Fentanyl and Versed were administered as conscious sedation during continuous cardiorespiratory monitoring by the radiology RN, with a total moderate sedation time of 17 minutes. TECHNIQUE: The procedure, risks, benefits, and alternatives were explained to the patient. Questions regarding the procedure were encouraged and answered. The patient understands and consents to the procedure. As antibiotic prophylaxis, cefazolin 2 g was ordered pre-procedure and administered intravenously within one hour of incision. Patency of the right IJ vein was confirmed with ultrasound with image documentation. An appropriate skin site was determined. Skin site was marked. Region was prepped using maximum barrier technique including cap and mask, sterile gown, sterile gloves, large sterile sheet, and Chlorhexidine as cutaneous antisepsis. The region was infiltrated locally with 1% lidocaine.Under real-time ultrasound guidance, the right IJ vein was accessed with a 21 gauge micropuncture needle; the needle tip within the vein was confirmed with ultrasound image documentation. Needle was exchanged over a 018 guidewire for transitional dilator which allowed passage of the [facility] wire into the IVC. Over this, the transitional dilator was exchanged for a 5 [place] MPA catheter. A small incision was made on the right anterior chest wall and a subcutaneous pocket fashioned. The power-injectable port was positioned and its catheter tunneled to the right IJ dermatotomy site. The MPA catheter was exchanged over an Amplatz wire for a peel-away sheath, through which the port catheter, which had been trimmed to the appropriate length, was advanced and positioned under fluoroscopy with its tip at the cavoatrial junction. Spot chest radiograph  confirms good catheter position and no pneumothorax. The pocket was closed with deep interrupted and subcuticular continuous 3-0 Monocryl sutures. The port was flushed per protocol. The incisions were covered with Dermabond then covered with a sterile dressing. COMPLICATIONS: COMPLICATIONS None immediate IMPRESSION: Technically successful right IJ power-injectable port catheter placement. Ready for routine use. Electronically Signed   By: Lucrezia Europe M.D.   On: 07/18/2015 09:29     CBC  Recent Labs Lab 07/17/15 1225 07/18/15 1017 07/22/15 0647 07/23/15 0745  WBC 7.1 5.6 8.1 7.2  HGB 11.9* 12.0 12.7 12.5  HCT 33.4* 34.7* 35.8* 33.8*  PLT 275 277 313 305  MCV 83.9 85.9 83.3 81.3  MCH 29.9 29.8 29.5 30.0  MCHC 35.6 34.7 35.5 37.0*  RDW 13.5 14.3 13.4 13.3  LYMPHSABS 1.3 1.5  --   --   MONOABS 0.4 0.4  --   --   EOSABS 0.1 0.1  --   --   BASOSABS 0.0 0.0  --   --     Chemistries   Recent Labs Lab 07/17/15 1225 07/18/15 1017 07/22/15 0647 07/22/15 1415 07/23/15 0745  NA 123* 121* 120* 118* 120*  K 3.1* 2.7* 2.6* 3.1* 3.8  CL 81*  --  78* 80* 83*  CO2 '29 24 28 26 24  '$ GLUCOSE 182* 215* 208* 216* 200*  BUN 6 4.1* <5* <5* 6  CREATININE 0.42* 0.7 0.53 0.42* 0.40*  CALCIUM 9.1 9.0 9.6 9.0 9.5  MG  --   --  0.9*  --  1.1*  AST  --  19 34  --  37  ALT  --  13 22  --  20  ALKPHOS  --  97 104  --  104  BILITOT  --  0.34 0.6  --  0.7   ------------------------------------------------------------------------------------------------------------------ estimated creatinine clearance is 86.5 mL/min (by C-G formula based on Cr of 0.4). ------------------------------------------------------------------------------------------------------------------  Recent Labs  07/22/15 0953  HGBA1C 9.3*   ------------------------------------------------------------------------------------------------------------------ No results for input(s): CHOL, HDL, LDLCALC, TRIG, CHOLHDL, LDLDIRECT in the  last 72 hours. ------------------------------------------------------------------------------------------------------------------ No results for input(s): TSH, T4TOTAL, T3FREE, THYROIDAB in the last 72 hours.  Invalid input(s): FREET3 ------------------------------------------------------------------------------------------------------------------ No results for input(s): VITAMINB12, FOLATE, FERRITIN, TIBC, IRON, RETICCTPCT in the last 72 hours.  Coagulation profile  Recent Labs Lab 07/17/15 1225  INR 1.05    No results for input(s): DDIMER in the last 72 hours.  Cardiac Enzymes No results for input(s): CKMB, TROPONINI, MYOGLOBIN in the last 168 hours.  Invalid input(s): CK ------------------------------------------------------------------------------------------------------------------ Invalid input(s): POCBNP   CBG:  Recent Labs Lab 07/22/15 2023 07/23/15 0002 07/23/15 0438 07/23/15 0804 07/23/15 1219  GLUCAP 183* 226* 167* 218* 222*       Studies: Ct Head Wo Contrast  07/22/2015  CLINICAL DATA:  Small cell lung carcinoma.  Headache. EXAM: CT HEAD WITHOUT CONTRAST TECHNIQUE: Contiguous axial images were obtained from the base of the skull through the vertex without intravenous contrast. COMPARISON:  Head CT June 06, 2015; brain MRI June 26, 2015 FINDINGS: The ventricles are normal in size and configuration. There is no intracranial mass, hemorrhage, extra-axial fluid collection, or midline shift. There is mild small vessel disease in the centra semiovale bilaterally. There is no new gray-white compartment lesion. No acute infarct evident. The bony calvarium appears intact. There is patchy mastoid air cell disease on the left, stable. Mastoids on the right are clear. There is apparent cerumen in the right external auditory canal, a finding also present previously. There is a retention cyst in the posterior left maxillary antrum. There is ethmoid sinus mucosal  thickening bilaterally. No intraorbital lesions are identified. IMPRESSION: Mild periventricular small vessel disease. No intracranial mass, hemorrhage, or extra-axial fluid collection. 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 Cerumen right external auditory canal. Areas of paranasal sinus disease as noted above. Electronically Signed   By: Lowella Grip III M.D.   On: 07/22/2015 08:05      Lab Results  Component Value Date   HGBA1C 9.3* 07/22/2015   HGBA1C 10.4* 06/06/2015   HGBA1C 12.2* 11/25/2014   Lab Results  Component Value Date   LDLCALC 35 06/07/2015   CREATININE 0.40* 07/23/2015       Scheduled Meds: . aspirin EC  81 mg Oral Daily  . budesonide-formoterol  2 puff Inhalation BID  . calcium-vitamin D  1 tablet Oral BID  . cholecalciferol  1,000 Units Oral Daily  . dexamethasone  4 mg Intravenous 4 times per day  . enoxaparin (LOVENOX) injection  40 mg Subcutaneous Q24H  . gabapentin  600 mg Oral TID  . insulin aspart  0-15 Units Subcutaneous 6 times per day  . insulin glargine  10 Units Subcutaneous QHS  . lamoTRIgine  125 mg Oral BID  . magnesium sulfate 1 - 4 g bolus IVPB  4 g Intravenous Once  . niacin  500 mg Oral QHS  . ondansetron (ZOFRAN) IV  4 mg Intravenous 4 times per day  . pantoprazole  40 mg Oral Daily  . pneumococcal 23 valent vaccine  0.5 mL Intramuscular Tomorrow-1000  . potassium chloride  40 mEq Oral TID  . QUEtiapine  800 mg Oral QHS  . simvastatin  40 mg Oral q1800  . tiotropium  18 mcg Inhalation Daily  . tolvaptan  15 mg Oral Q24H  . zolpidem  5 mg Oral QHS   Continuous Infusions: . 0.9 % sodium chloride with kcl 75 mL/hr at 07/23/15 2179    Principal Problem:   Intractable nausea and vomiting Active Problems:   Hypertension   COPD (chronic obstructive pulmonary disease) (HCC)   Headache   Chronic respiratory failure with hypoxia (HCC)   Bipolar I disorder (HCC)   Type 2 diabetes mellitus with hyperglycemia (HCC)   Hyperlipidemia    SIADH (syndrome of inappropriate ADH production) (HCC)   Hypomagnesemia   Mediastinal lymphadenopathy   Malignant neoplasm of lower lobe of right lung /Stage IIIA (T1a, N2, M0)   Chronic hypokalemia    Time spent: 45 minutes   [facility] Pager [phone]. If 7PM-7AM, please contact night-coverage at [email or web address], password [facility] 07/23/2015, 2:30 PM

## 2015-07-24 ENCOUNTER — Inpatient Hospital Stay (HOSPITAL_COMMUNITY): Payer: Medicaid Other

## 2015-07-24 ENCOUNTER — Telehealth: Payer: Self-pay | Admitting: Internal Medicine

## 2015-07-24 ENCOUNTER — Other Ambulatory Visit: Payer: Self-pay

## 2015-07-24 LAB — BASIC METABOLIC PANEL
ANION GAP: 11 (ref 5–15)
ANION GAP: 10 (ref 5–15)
ANION GAP: 7 (ref 5–15)
BUN: 7 mg/dL (ref 6–20)
BUN: 7 mg/dL (ref 6–20)
BUN: 8 mg/dL (ref 6–20)
CALCIUM: 8.9 mg/dL (ref 8.9–10.3)
CALCIUM: 9 mg/dL (ref 8.9–10.3)
CALCIUM: 8.5 mg/dL — AB (ref 8.9–10.3)
CO2: 25 mmol/L (ref 22–32)
CO2: 25 mmol/L (ref 22–32)
CO2: 26 mmol/L (ref 22–32)
CREATININE: 0.6 mg/dL (ref 0.44–1.00)
Chloride: 87 mmol/L — ABNORMAL LOW (ref 101–111)
Chloride: 91 mmol/L — ABNORMAL LOW (ref 101–111)
Chloride: 92 mmol/L — ABNORMAL LOW (ref 101–111)
Creatinine, Ser: 0.6 mg/dL (ref 0.44–1.00)
Creatinine, Ser: 0.69 mg/dL (ref 0.44–1.00)
GFR calc Af Amer: >60 mL/min (ref >60)
GFR calc Af Amer: >60 mL/min (ref >60)
GFR calc Af Amer: >60 mL/min (ref >60)
GFR calc non Af Amer: >60 mL/min (ref >60)
GFR calc non Af Amer: >60 mL/min (ref >60)
GLUCOSE: 242 mg/dL — AB (ref 65–99)
GLUCOSE: 259 mg/dL — AB (ref 65–99)
GLUCOSE: 297 mg/dL — AB (ref 65–99)
Potassium: 3.3 mmol/L — ABNORMAL LOW (ref 3.5–5.1)
Potassium: 4 mmol/L (ref 3.5–5.1)
Potassium: 4.5 mmol/L (ref 3.5–5.1)
Sodium: 123 mmol/L — ABNORMAL LOW (ref 135–145)
Sodium: 126 mmol/L — ABNORMAL LOW (ref 135–145)
Sodium: 125 mmol/L — ABNORMAL LOW (ref 135–145)

## 2015-07-24 LAB — GLUCOSE, CAPILLARY
GLUCOSE-CAPILLARY: 232 mg/dL — AB (ref 65–99)
GLUCOSE-CAPILLARY: 243 mg/dL — AB (ref 65–99)
Glucose-Capillary: 250 mg/dL — ABNORMAL HIGH (ref 65–99)
Glucose-Capillary: 254 mg/dL — ABNORMAL HIGH (ref 65–99)
Glucose-Capillary: 274 mg/dL — ABNORMAL HIGH (ref 65–99)
Glucose-Capillary: 298 mg/dL — ABNORMAL HIGH (ref 65–99)

## 2015-07-24 LAB — MAGNESIUM: MAGNESIUM: 1.5 mg/dL — AB (ref 1.7–2.4)

## 2015-07-24 MED ORDER — MAGNESIUM OXIDE 400 (241.3 MG) MG PO TABS
800.0000 mg | ORAL_TABLET | Freq: Two times a day (BID) | ORAL | Status: DC
  Administered 2015-07-24 – 2015-07-25 (×3): 800 mg via ORAL
  Filled 2015-07-24 (×3): qty 2

## 2015-07-24 MED ORDER — POTASSIUM CHLORIDE CRYS ER 20 MEQ PO TBCR
60.0000 meq | EXTENDED_RELEASE_TABLET | Freq: Three times a day (TID) | ORAL | Status: DC
  Administered 2015-07-24 – 2015-07-25 (×3): 60 meq via ORAL
  Filled 2015-07-24 (×3): qty 3

## 2015-07-24 MED ORDER — MAGNESIUM SULFATE 50 % IJ SOLN
3.0000 g | Freq: Once | INTRAVENOUS | Status: AC
  Administered 2015-07-24: 3 g via INTRAVENOUS
  Filled 2015-07-24: qty 6

## 2015-07-24 MED ORDER — MAGNESIUM SULFATE 2 GM/50ML IV SOLN
2.0000 g | Freq: Once | INTRAVENOUS | Status: AC
  Administered 2015-07-24: 2 g via INTRAVENOUS
  Filled 2015-07-24: qty 50

## 2015-07-24 MED ORDER — POTASSIUM CHLORIDE CRYS ER 20 MEQ PO TBCR: 30.0000 meq | EXTENDED_RELEASE_TABLET | Freq: Once | ORAL | Status: DC

## 2015-07-24 MED ORDER — POTASSIUM CHLORIDE CRYS ER 20 MEQ PO TBCR: 40.0000 meq | EXTENDED_RELEASE_TABLET | Freq: Three times a day (TID) | ORAL | Status: DC

## 2015-07-24 MED ORDER — POTASSIUM CHLORIDE CRYS ER 20 MEQ PO TBCR
20.0000 meq | EXTENDED_RELEASE_TABLET | Freq: Once | ORAL | Status: AC
  Administered 2015-07-24: 20 meq via ORAL
  Filled 2015-07-24: qty 1

## 2015-07-24 MED ORDER — SODIUM CHLORIDE 0.9 % IJ SOLN
10.0000 mL | INTRAMUSCULAR | Status: DC | PRN
  Administered 2015-07-25: 10 mL
  Filled 2015-07-24: qty 40

## 2015-07-24 MED ORDER — MAGNESIUM SULFATE 4 GM/100ML IV SOLN: 4.0000 g | Freq: Once | INTRAVENOUS | Status: DC

## 2015-07-24 NOTE — Progress Notes (Signed)
Inpatient Diabetes Program Recommendations  AACE/ADA: New Consensus Statement on Inpatient Glycemic Control (2015)  Target Ranges:  Prepandial:   less than 140 mg/dL      Peak postprandial:   less than 180 mg/dL (1-2 hours)      Critically ill patients:  140 - 180 mg/dL    Results for NARCISA, GANESH (MRN [id number]) as of 07/24/2015 07:13  Ref. Range 07/23/2015 08:04 07/23/2015 12:19 07/23/2015 16:48 07/23/2015 21:05 07/24/2015 00:43 07/24/2015 03:56  Glucose-Capillary Latest Ref Range: 65-99 mg/dL 218 (H) 222 (H) 261 (H) 266 (H) 298 (H) 250 (H)   Review of Glycemic Control  Diabetes history: DM 2 Outpatient Diabetes medications: Lantus 10, Novolog 5 units TID, Metformin 1,000 mg BID Current orders for Inpatient glycemic control: Lantus 10, Novolog Moderate Q4hrs, Decadron 4 mg 4x/day  Inpatient Diabetes Program Recommendations: Insulin - Basal: While on steroids, please consider increasing basal insulin to 14 units QHS. Insulin - Meal Coverage: Patient takes Novolog 5 units TID meal coverage at home. Please consider starting Novolog 3 units TID meal coverage in addition to correction scale.  Thanks,  Tama Headings RN, MSN, [facility] Inpatient Diabetes Coordinator Team Pager [phone] (8a-5p)

## 2015-07-24 NOTE — Telephone Encounter (Signed)
pt sister Chong Sicilian cld stated pt in hospital may be released later today or in am and pt has trmt-adv to call in am to adv and to spk with Dr Ellan Lambert nurse to make aware and they may move trmt to later date. Patty understood

## 2015-07-24 NOTE — Progress Notes (Signed)
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 She has been evaluated as an outpatient by radiation oncology as well as Dr. Julien Nordmann with medical oncology and plans are to start chemotherapy next week. She has multiple chronic electrolyte abnormalities including chronic hyponatremia and hypochloremia in the setting of diagnosed SIADH, chronic hypokalemia and chronic hypomagnesemia. She was last discharged on 11/11 to assisted living facility after an admission for nausea vomiting and diarrhea with worsening of her underlying hyponatremia. 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 At that time she did not carry a diagnosis of pulmonary carcinoma. She was given a one-time dose of Samsca with improvement in her sodium to 131 and at time of discharge her baseline sodium was documented as being in the 120s. Since discharge her sodium has averaged between 118 and 123. She returns to the ER today in the setting of headache with associated intractable nausea  and vomiting. Patient reports a history of migraines in the past. She primarily describes this headache as being on the "top" of her head although by my evaluation this discomfort had migrated to the left periorbital agent. She's not had any fevers, no neck stiffness or rigidity, no recent falls or traumas, no visual changes or focal neurological deficits, no change in functional ability, sinus infection type symptoms including drainage or pain. She reports she has been having excessive episodes of vomiting for at least 4-5 days. She's not had any GI symptoms or abdominal pain and no diarrhea. She has not started chemotherapy.  In the ER the patient was afebrile, somewhat mildly hypertensive with a blood pressure of 175/92, pulse was 94, respirations 24 room air saturations were 95%. Patient reports she wears nasal cannula oxygen at home and subsequently 3 L oxygen was applied. Noncontrasted CT the head was completed and was unremarkable. Patient did undergo MRI of the brain with and without contrast on 11/15 during previous admission and this did not demonstrate any areas of metastatic disease. Sodium was 120, potassium lower than usual at 2.6 with typical ranges since discharge between 3.1 and 3.2 on low-dose potassium supplementation. BUN was less than 5 chloride low at 78, CBC normal, magnesium very low at 0.9 with previous readings ranging between 1.0 when last checked November 2016. Glucose somewhat elevated at 208.  Upon my evaluation of the patient she reports her nausea has resolved and she is requesting ice cream to eat.  Assessment and plan Intractable nausea and vomiting 2/2 Headache due to hyponatremia  Could be related to hyponatremia, improved. Improved with IV  normal saline and continues to improve without tolvaptan (although Uosm is inappropriately high as well as UNa but was on IV saline at the time and lower than her previous urine sodium levels). Likely component of volume depletion at  this presentation. -Clinically appears volume depleted so will continue IV fluids, reduce rate , history of SIADH -Scheduled IV Zofran with as needed IV Phenergan for intractable nausea and vomiting -Decadron 4 mg IV every 6 hours  For 2 doses given ,now discontinued   :   SIADH with chronic hyponatremia -Baseline appears to be between 120 and 123, intermittently low since 2014-Likely will need to resume continue fluid restriction. Improved with IV normal saline and continues to improve without tolvaptan.   Likely component of volume depletion at this presentation. Will decrease rate of IVF's and follow Sodium levels. Hopefully her chronic SIADH will improve with chemoradiation therapy.Anticipated to have chemo on 12/20 -Previously etiology suspected related to psych meds but suspect underlying malignancy primary causative factor  Pharmacy review psych medications, not contributing to hyponatremia at this point   Hypomagnesemia/Chronic hypokalemia -IV replete with 6 g magnesium  On 12/12, now 1.5 , continue aggressive repletion Replete K    Chronic respiratory failure with hypoxia /COPD -Continue home oxygen -Continue preadmission nebs and MDIs -Compensated at this juncture although with some mild wheezing     Type 2 diabetes mellitus with hyperglycemia  Continue Lantus and hold metformin -Provide SSI since expect CBGs may climb with use of Decadron   Mediastinal lymphadenopathy/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 Earlie Server of medical oncology and Dr. Lisbeth Renshaw of radiation oncology -Plan is to begin chemotherapy on December 20 as an outpatient Called oncology and they will make recommendations about when to start chemo    Hypertension -Currently moderately controlled -Was not on antihypertensive medications prior to admission   Bipolar I disorder ([employer]) -Continue preadmission Ativan, Lamictal,  Seroquel -Is somewhat confused but not agitated or manic   Hyperlipidemia -Continue statin     DVT prophylaxsis   Code Status:      Code Status Orders        Start     Ordered   07/22/15 1126  Full code   Continuous     07/22/15 1125      Family Communication: Discussed in detail with the patient, all imaging results, lab results explained to the patient   Disposition Plan:  As above       Consultants:  Nephrology  Procedures:  None  Antibiotics: Anti-infectives    None         HPI/Subjective: Patient awake alert oriented, sitting comfortably in the chair, feels better today, headache and nausea have resolved  Objective: Filed Vitals:   07/24/15 0625 07/24/15 0739 07/24/15 0745 07/24/15 1249  BP: 158/90   154/76  Pulse: 98   100  Temp: 97.4 F (36.3 C)   98 F (36.7 C)  TempSrc: Oral   Oral  Resp: 20   20  Height:      Weight:      SpO2: 94% 96% 95% 96%    Intake/Output Summary (Last 24 hours) at 07/24/15 1334 Last data filed at 07/24/15 1140  Gross per 24 hour  Intake      0 ml  Output   2550 ml  Net  -2550 ml    Exam:  General: No acute respiratory distress Lungs: Clear to auscultation bilaterally without wheezes  or crackles Cardiovascular: Regular rate and rhythm without murmur gallop or rub normal S1 and S2 Abdomen: Nontender, nondistended, soft, bowel sounds positive, no rebound, no ascites, no appreciable mass Extremities: No significant cyanosis, clubbing, or edema bilateral lower extremities     Data Review   Micro Results Recent Results (from the past 240 hour(s))  MRSA PCR Screening     Status: None   Collection Time: 07/23/15  1:02 AM  Result Value Ref Range Status   MRSA by PCR NEGATIVE NEGATIVE Final    Comment:        The GeneXpert MRSA Assay (FDA approved for NASAL specimens only), is one component of a comprehensive MRSA colonization surveillance program. It is not intended to diagnose MRSA infection  nor to guide or monitor treatment for MRSA infections.     Radiology Reports Ct Head Wo Contrast  07/24/2015  CLINICAL DATA:  Slipped and fell face forward, hitting left side of head. Headache. Initial encounter. EXAM: CT HEAD WITHOUT CONTRAST TECHNIQUE: Contiguous axial images were obtained from the base of the skull through the vertex without intravenous contrast. COMPARISON:  CT of the head performed 07/22/2015 FINDINGS: There is no evidence of acute infarction, mass lesion, or intra- or extra-axial hemorrhage on CT. The posterior fossa, including the cerebellum, brainstem and fourth ventricle, is within normal limits. The third and lateral ventricles, and basal ganglia are unremarkable in appearance. The cerebral hemispheres are symmetric in appearance, with normal gray-white differentiation. No mass effect or midline shift is seen. There is no evidence of fracture; visualized osseous structures are unremarkable in appearance. The orbits are within normal limits. There is partial opacification of the left maxillary sinus. There is mild partial opacification of the left mastoid air cells. The remaining paranasal sinuses and right mastoid air cells are well-aerated. No significant soft tissue abnormalities are seen. IMPRESSION: 1. No evidence of traumatic intracranial injury or fracture. 2. Partial opacification of the left maxillary sinus and mild partial opacification of the left mastoid air cells. Electronically Signed   By: Garald Balding M.D.   On: 07/24/2015 04:58   Ct Head Wo Contrast  07/22/2015  CLINICAL DATA:  Small cell lung carcinoma.  Headache. EXAM: CT HEAD WITHOUT CONTRAST TECHNIQUE: Contiguous axial images were obtained from the base of the skull through the vertex without intravenous contrast. COMPARISON:  Head CT June 06, 2015; brain MRI June 26, 2015 FINDINGS: The ventricles are normal in size and configuration. There is no intracranial mass, hemorrhage, extra-axial fluid  collection, or midline shift. There is mild small vessel disease in the centra semiovale bilaterally. There is no new gray-white compartment lesion. No acute infarct evident. The bony calvarium appears intact. There is patchy mastoid air cell disease on the left, stable. Mastoids on the right are clear. There is apparent cerumen in the right external auditory canal, a finding also present previously. There is a retention cyst in the posterior left maxillary antrum. There is ethmoid sinus mucosal thickening bilaterally. No intraorbital lesions are identified. IMPRESSION: Mild periventricular small vessel disease. No intracranial mass, hemorrhage, or extra-axial fluid collection. 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 Cerumen right external auditory canal. Areas of paranasal sinus disease as noted above. Electronically Signed   By: Lowella Grip III M.D.   On: 07/22/2015 08:05   Mr Jeri Cos DX Contrast  06/26/2015  CLINICAL DATA:  History of small cell lung cancer. History of diabetes, cocaine abuse, abscess, septic shock. EXAM: MRI HEAD WITHOUT AND WITH CONTRAST TECHNIQUE: Multiplanar,  multiecho pulse sequences of the brain and surrounding structures were obtained without and with intravenous contrast. CONTRAST:  43m MULTIHANCE GADOBENATE DIMEGLUMINE 529 MG/ML IV SOLN COMPARISON:  CT head June 06, 2015 FINDINGS: Mild motion degraded examination. The ventricles and sulci are normal for patient's age. No abnormal parenchymal signal, mass lesions, mass effect. Patchy supratentorial white matter T2 hyperintensities. No abnormal parenchymal enhancement. No reduced diffusion to suggest acute ischemia. No susceptibility artifact to suggest hemorrhage. No abnormal extra-axial fluid collections. No extra-axial masses nor leptomeningeal enhancement. Normal major intracranial vascular flow voids seen at the skull base. Ocular globes and orbital contents are unremarkable though not tailored for evaluation. No suspicious  calvarial bone marrow signal. No abnormal sellar expansion. Craniocervical junction maintained. LEFT maxillary sinus air-fluid level. Small LEFT mastoid effusion. IMPRESSION: Mild motion degraded examination without acute intracranial process nor MR findings of intracranial metastasis. Mild to moderate white matter changes most often seen with chronic small vessel ischemic disease. Electronically Signed   By: CElon AlasM.D.   On: 06/26/2015 22:09   Nm Pet Image Initial (pi) Skull Base To Thigh  07/12/2015  CLINICAL DATA:  Initial treatment strategy for newly diagnosed small-cell lung carcinoma based on EBUS FNA of a subcarinal lymph node on 06/21/2015. EXAM: NUCLEAR MEDICINE PET SKULL BASE TO THIGH TECHNIQUE: 12.3 mCi F-18 FDG was injected intravenously. Full-ring PET imaging was performed from the skull base to thigh after the radiotracer. CT data was obtained and used for attenuation correction and anatomic localization. FASTING BLOOD GLUCOSE:  Value: 224 mg/dl COMPARISON:  06/19/2015 chest CT.  06/17/2015 CT abdomen/ pelvis. FINDINGS: NECK No hypermetabolic lymph nodes in the neck. Mucous retention cyst versus polyp in the inferior posterior left maxillary sinus. CHEST There is a hypermetabolic 2.2 x 2.1 cm posterior right lower lobe pulmonary nodule (series 3/image 118) with max SUV 4.0, increased in size from 1.5 x 1.3 cm on 06/19/2015. There is a suggestion of a new 0.6 cm anterior right lower lobe pulmonary nodule (3/123), below PET resolution. No acute consolidative airspace disease. Mosaic attenuation throughout the lungs. There are multiple hypermetabolic ipsilateral right peribronchial nodes, largest 2.0 cm (series 3/image 90) with max SUV 10.2. There is bulky subcarinal hypermetabolic adenopathy measuring 3.6 cm (series 3/image 93) with max SUV 11.2. There is a mildly hypermetabolic and mildly enlarged right lower paratracheal 1.1 cm node (series 3/image 87) with max SUV 4.3. No contralateral  mediastinal, contralateral hilar or supraclavicular hypermetabolic adenopathy. No hypermetabolic axillary nodes. Left anterior descending and right coronary atherosclerosis. Mitral annular calcifications. ABDOMEN/PELVIS No abnormal hypermetabolic activity within the liver, pancreas, adrenal glands, or spleen. No hypermetabolic lymph nodes in the abdomen or pelvis. Mild diffuse hepatic steatosis. Status post cholecystectomy. Two clustered 3 mm nonobstructing stones in the lower left kidney. Malrotated left kidney. Mild fullness of the left renal collecting system without overt hydronephrosis. SKELETON No focal hypermetabolic activity to suggest skeletal metastasis. IMPRESSION: 1. Hypermetabolic 2.2 cm right lower lobe pulmonary nodule, in keeping with a primary bronchogenic carcinoma, which has grown since 06/19/2015. 2. Hypermetabolic ipsilateral peribronchial, subcarinal and ipsilateral paratracheal lymphadenopathy. 3. Suggestion of a new 6 mm right lower lobe pulmonary nodule, below PET resolution, suspect an ipsilateral primary tumor lobe pulmonary metastasis. 4. No extrathoracic hypermetabolic metastatic disease. 5. TNM Stage IIIa by PET-CT (T3 due to suspected ipsilateral primary tumor lobe metastasis N2 M0). 6. Additional findings including 2 vessel coronary atherosclerosis, mild diffuse hepatic steatosis and nonobstructing tiny left renal stones. Electronically Signed   By: JCorene Cornea  A Poff M.D.   On: 07/12/2015 10:56   Ir Fluoro Guide Cv Line Right  07/18/2015  CLINICAL DATA:  Lung carcinoma, needs venous access for chemotherapy EXAM: TUNNELED PORT CATHETER PLACEMENT WITH ULTRASOUND AND FLUOROSCOPIC GUIDANCE FLUOROSCOPY TIME:  0.4 minutes, 286  uGym2 DAP ANESTHESIA/SEDATION: Intravenous Fentanyl and Versed were administered as conscious sedation during continuous cardiorespiratory monitoring by the radiology RN, with a total moderate sedation time of 17 minutes. TECHNIQUE: The procedure, risks, benefits, and  alternatives were explained to the patient. Questions regarding the procedure were encouraged and answered. The patient understands and consents to the procedure. As antibiotic prophylaxis, cefazolin 2 g was ordered pre-procedure and administered intravenously within one hour of incision. Patency of the right IJ vein was confirmed with ultrasound with image documentation. An appropriate skin site was determined. Skin site was marked. Region was prepped using maximum barrier technique including cap and mask, sterile gown, sterile gloves, large sterile sheet, and Chlorhexidine as cutaneous antisepsis. The region was infiltrated locally with 1% lidocaine.Under real-time ultrasound guidance, the right IJ vein was accessed with a 21 gauge micropuncture needle; the needle tip within the vein was confirmed with ultrasound image documentation. Needle was exchanged over a 018 guidewire for transitional dilator which allowed passage of the [facility] wire into the IVC. Over this, the transitional dilator was exchanged for a 5 [place] MPA catheter. A small incision was made on the right anterior chest wall and a subcutaneous pocket fashioned. The power-injectable port was positioned and its catheter tunneled to the right IJ dermatotomy site. The MPA catheter was exchanged over an Amplatz wire for a peel-away sheath, through which the port catheter, which had been trimmed to the appropriate length, was advanced and positioned under fluoroscopy with its tip at the cavoatrial junction. Spot chest radiograph confirms good catheter position and no pneumothorax. The pocket was closed with deep interrupted and subcuticular continuous 3-0 Monocryl sutures. The port was flushed per protocol. The incisions were covered with Dermabond then covered with a sterile dressing. COMPLICATIONS: COMPLICATIONS None immediate IMPRESSION: Technically successful right IJ power-injectable port catheter placement. Ready for routine use. Electronically  Signed   By: Lucrezia Europe M.D.   On: 07/18/2015 09:29   Ir US Guide Vasc Access Right  07/18/2015  CLINICAL DATA:  Lung carcinoma, needs venous access for chemotherapy EXAM: TUNNELED PORT CATHETER PLACEMENT WITH ULTRASOUND AND FLUOROSCOPIC GUIDANCE FLUOROSCOPY TIME:  0.4 minutes, 286  uGym2 DAP ANESTHESIA/SEDATION: Intravenous Fentanyl and Versed were administered as conscious sedation during continuous cardiorespiratory monitoring by the radiology RN, with a total moderate sedation time of 17 minutes. TECHNIQUE: The procedure, risks, benefits, and alternatives were explained to the patient. Questions regarding the procedure were encouraged and answered. The patient understands and consents to the procedure. As antibiotic prophylaxis, cefazolin 2 g was ordered pre-procedure and administered intravenously within one hour of incision. Patency of the right IJ vein was confirmed with ultrasound with image documentation. An appropriate skin site was determined. Skin site was marked. Region was prepped using maximum barrier technique including cap and mask, sterile gown, sterile gloves, large sterile sheet, and Chlorhexidine as cutaneous antisepsis. The region was infiltrated locally with 1% lidocaine.Under real-time ultrasound guidance, the right IJ vein was accessed with a 21 gauge micropuncture needle; the needle tip within the vein was confirmed with ultrasound image documentation. Needle was exchanged over a 018 guidewire for transitional dilator which allowed passage of the [facility] wire into the IVC. Over this, the transitional dilator was exchanged for a  5 [place] MPA catheter. A small incision was made on the right anterior chest wall and a subcutaneous pocket fashioned. The power-injectable port was positioned and its catheter tunneled to the right IJ dermatotomy site. The MPA catheter was exchanged over an Amplatz wire for a peel-away sheath, through which the port catheter, which had been trimmed to the  appropriate length, was advanced and positioned under fluoroscopy with its tip at the cavoatrial junction. Spot chest radiograph confirms good catheter position and no pneumothorax. The pocket was closed with deep interrupted and subcuticular continuous 3-0 Monocryl sutures. The port was flushed per protocol. The incisions were covered with Dermabond then covered with a sterile dressing. COMPLICATIONS: COMPLICATIONS None immediate IMPRESSION: Technically successful right IJ power-injectable port catheter placement. Ready for routine use. Electronically Signed   By: Lucrezia Europe M.D.   On: 07/18/2015 09:29     CBC  Recent Labs Lab 07/18/15 1017 07/22/15 0647 07/23/15 0745  WBC 5.6 8.1 7.2  HGB 12.0 12.7 12.5  HCT 34.7* 35.8* 33.8*  PLT 277 313 305  MCV 85.9 83.3 81.3  MCH 29.8 29.5 30.0  MCHC 34.7 35.5 37.0*  RDW 14.3 13.4 13.3  LYMPHSABS 1.5  --   --   MONOABS 0.4  --   --   EOSABS 0.1  --   --   BASOSABS 0.0  --   --     Chemistries   Recent Labs Lab 07/18/15 1017  07/22/15 0647 07/22/15 1415 07/23/15 0745 07/23/15 1740 07/23/15 2152 07/24/15 0445  NA 121*  < > 120* 118* 120* 120*  120* 122* 123*  K 2.7*  < > 2.6* 3.1* 3.8 3.6  3.5 3.6 3.3*  CL  --   < > 78* 80* 83* 82*  82* 84* 87*  CO2 24  < > '28 26 24 27  26 27 25  '$ GLUCOSE 215*  < > 208* 216* 200* 255*  256* 258* 242*  BUN 4.1*  < > <5* <5* '6 8  8 8 7  '$ CREATININE 0.7  < > 0.53 0.42* 0.40* 0.56  0.59 0.68 0.60  CALCIUM 9.0  < > 9.6 9.0 9.5 9.6  9.7 9.2 8.9  MG  --   --  0.9*  --  1.1*  --   --  1.5*  AST 19  --  34  --  37  --   --   --   ALT 13  --  22  --  20  --   --   --   ALKPHOS 97  --  104  --  104  --   --   --   BILITOT 0.34  --  0.6  --  0.7  --   --   --   < > = values in this interval not displayed. ------------------------------------------------------------------------------------------------------------------ estimated creatinine clearance is 86.5 mL/min (by C-G formula based on Cr of  0.6). ------------------------------------------------------------------------------------------------------------------  Recent Labs  07/22/15 0953  HGBA1C 9.3*   ------------------------------------------------------------------------------------------------------------------ No results for input(s): CHOL, HDL, LDLCALC, TRIG, CHOLHDL, LDLDIRECT in the last 72 hours. ------------------------------------------------------------------------------------------------------------------  Recent Labs  07/23/15 1740  TSH 0.398   ------------------------------------------------------------------------------------------------------------------ No results for input(s): VITAMINB12, FOLATE, FERRITIN, TIBC, IRON, RETICCTPCT in the last 72 hours.  Coagulation profile No results for input(s): INR, PROTIME in the last 168 hours.  No results for input(s): DDIMER in the last 72 hours.  Cardiac Enzymes No results for input(s): CKMB, TROPONINI, MYOGLOBIN in the last 168 hours.  Invalid  input(s): CK ------------------------------------------------------------------------------------------------------------------ Invalid input(s): POCBNP   CBG:  Recent Labs Lab 07/23/15 2105 07/24/15 0043 07/24/15 0356 07/24/15 0819 07/24/15 1137  GLUCAP 266* 298* 250* 232* 243*       Studies: Ct Head Wo Contrast  07/24/2015  CLINICAL DATA:  Slipped and fell face forward, hitting left side of head. Headache. Initial encounter. EXAM: CT HEAD WITHOUT CONTRAST TECHNIQUE: Contiguous axial images were obtained from the base of the skull through the vertex without intravenous contrast. COMPARISON:  CT of the head performed 07/22/2015 FINDINGS: There is no evidence of acute infarction, mass lesion, or intra- or extra-axial hemorrhage on CT. The posterior fossa, including the cerebellum, brainstem and fourth ventricle, is within normal limits. The third and lateral ventricles, and basal ganglia are unremarkable  in appearance. The cerebral hemispheres are symmetric in appearance, with normal gray-white differentiation. No mass effect or midline shift is seen. There is no evidence of fracture; visualized osseous structures are unremarkable in appearance. The orbits are within normal limits. There is partial opacification of the left maxillary sinus. There is mild partial opacification of the left mastoid air cells. The remaining paranasal sinuses and right mastoid air cells are well-aerated. No significant soft tissue abnormalities are seen. IMPRESSION: 1. No evidence of traumatic intracranial injury or fracture. 2. Partial opacification of the left maxillary sinus and mild partial opacification of the left mastoid air cells. Electronically Signed   By: Garald Balding M.D.   On: 07/24/2015 04:58      Lab Results  Component Value Date   HGBA1C 9.3* 07/22/2015   HGBA1C 10.4* 06/06/2015   HGBA1C 12.2* 11/25/2014   Lab Results  Component Value Date   LDLCALC 35 06/07/2015   CREATININE 0.60 07/24/2015       Scheduled Meds: . aspirin EC  81 mg Oral Daily  . budesonide-formoterol  2 puff Inhalation BID  . calcium-vitamin D  1 tablet Oral BID  . enoxaparin (LOVENOX) injection  40 mg Subcutaneous Q24H  . gabapentin  600 mg Oral TID  . insulin aspart  0-15 Units Subcutaneous 6 times per day  . insulin glargine  10 Units Subcutaneous QHS  . lamoTRIgine  125 mg Oral BID  . niacin  500 mg Oral QHS  . ondansetron (ZOFRAN) IV  4 mg Intravenous 4 times per day  . pantoprazole  40 mg Oral Daily  . pneumococcal 23 valent vaccine  0.5 mL Intramuscular Tomorrow-1000  . potassium chloride  40 mEq Oral TID  . QUEtiapine  800 mg Oral QHS  . simvastatin  40 mg Oral q1800  . tiotropium  18 mcg Inhalation Daily  . tolvaptan  15 mg Oral Q24H  . zolpidem  5 mg Oral QHS   Continuous Infusions: . 0.9 % sodium chloride with kcl 50 mL/hr at 07/24/15 1319    Principal Problem:   Intractable nausea and  vomiting Active Problems:   Hypertension   COPD (chronic obstructive pulmonary disease) (HCC)   Headache   Chronic respiratory failure with hypoxia (HCC)   Bipolar I disorder (HCC)   Type 2 diabetes mellitus with hyperglycemia (HCC)   Hyperlipidemia   SIADH (syndrome of inappropriate ADH production) (HCC)   Hypomagnesemia   Mediastinal lymphadenopathy   Malignant neoplasm of lower lobe of right lung /Stage IIIA (T1a, N2, M0)   Chronic hypokalemia    Time spent: 45 minutes   [facility] Pager [phone]. If 7PM-7AM, please contact night-coverage at [email or web address], password [facility] 07/24/2015, 1:34 PM  LOS:  1 day

## 2015-07-24 NOTE — Progress Notes (Signed)
Patient upset because bed exit alarm placed.  Explain the importance. Patient also stated that she wanted the door closed explain that we need to keep the door open states she needs to have the door closed.

## 2015-07-24 NOTE — Progress Notes (Signed)
   07/24/15 0320  What Happened  Was fall witnessed? No  Was patient injured? Yes  Patient found on floor  Found by Staff-comment  Stated prior activity to/from bed, chair, or stretcher  Follow Up  MD notified Guerry Minors NP  Time MD notified [phone]  Family notified Yes-comment (spouse at bedside)  Time family notified 0320  Additional tests Yes-comment (CT of head)  Progress note created (see row info) Yes  Adult Fall Risk Assessment  Risk Factor Category (scoring not indicated) High fall risk per protocol (document High fall risk)

## 2015-07-24 NOTE — Progress Notes (Signed)
Patient laying at the end of the bed explain that she need to sleep to the head states that she feels closed in

## 2015-07-24 NOTE — Progress Notes (Signed)
Patient ID: Sarah Leonard, female   DOB: 1960/12/24, 54 y.o.   MRN: [id number] S:Pt reports fall last night, otherwise doing better O:BP 158/90 mmHg  Pulse 98  Temp(Src) 97.4 F (36.3 C) (Oral)  Resp 20  Ht '5\' 3"'$  (1.6 m)  Wt 93.849 kg (206 lb 14.4 oz)  BMI 36.66 kg/m2  SpO2 95%  Intake/Output Summary (Last 24 hours) at 07/24/15 9924 Last data filed at 07/24/15 0330  Gross per 24 hour  Intake      0 ml  Output   1500 ml  Net  -1500 ml   Intake/Output: I/O last 3 completed shifts: In: -  Out: 2600 [Urine:2600]  Intake/Output this shift:    Weight change:  Gen:WD obese WF in NAd CVS:no rub Resp:cta with occ rhonchi QAS:TMHDQQ Ext:no edema   Recent Labs Lab 07/17/15 1225 07/18/15 1017 07/22/15 0647 07/22/15 1415 07/23/15 0745 07/23/15 1740 07/23/15 2152 07/24/15 0445  NA 123* 121* 120* 118* 120* 120*  120* 122* 123*  K 3.1* 2.7* 2.6* 3.1* 3.8 3.6  3.5 3.6 3.3*  CL 81*  --  78* 80* 83* 82*  82* 84* 87*  CO2 '29 24 28 26 24 27  26 27 25  '$ GLUCOSE 182* 215* 208* 216* 200* 255*  256* 258* 242*  BUN 6 4.1* <5* <5* '6 8  8 8 7  '$ CREATININE 0.42* 0.7 0.53 0.42* 0.40* 0.56  0.59 0.68 0.60  ALBUMIN  --  3.3* 3.3*  --  3.0* 3.3*  --   --   CALCIUM 9.1 9.0 9.6 9.0 9.5 9.6  9.7 9.2 8.9  PHOS  --   --   --   --  4.3 3.1  --   --   AST  --  19 34  --  37  --   --   --   ALT  --  13 22  --  20  --   --   --    Liver Function Tests:  Recent Labs Lab 07/18/15 1017 07/22/15 0647 07/23/15 0745 07/23/15 1740  AST 19 34 37  --   ALT '13 22 20  '$ --   ALKPHOS 97 104 104  --   BILITOT 0.34 0.6 0.7  --   PROT 6.7 7.0 6.3*  --   ALBUMIN 3.3* 3.3* 3.0* 3.3*   No results for input(s): LIPASE, AMYLASE in the last 168 hours. No results for input(s): AMMONIA in the last 168 hours. CBC:  Recent Labs Lab 07/17/15 1225 07/18/15 1017 07/22/15 0647 07/23/15 0745  WBC 7.1 5.6 8.1 7.2  NEUTROABS 5.3 3.6  --   --   HGB 11.9* 12.0 12.7 12.5  HCT 33.4* 34.7* 35.8* 33.8*   MCV 83.9 85.9 83.3 81.3  PLT 275 277 313 305   Cardiac Enzymes: No results for input(s): CKTOTAL, CKMB, CKMBINDEX, TROPONINI in the last 168 hours. CBG:  Recent Labs Lab 07/23/15 1219 07/23/15 1648 07/23/15 2105 07/24/15 0043 07/24/15 0356  GLUCAP 222* 261* 266* 298* 250*    Iron Studies: No results for input(s): IRON, TIBC, TRANSFERRIN, FERRITIN in the last 72 hours. Studies/Results: Ct Head Wo Contrast  07/24/2015  CLINICAL DATA:  Slipped and fell face forward, hitting left side of head. Headache. Initial encounter. EXAM: CT HEAD WITHOUT CONTRAST TECHNIQUE: Contiguous axial images were obtained from the base of the skull through the vertex without intravenous contrast. COMPARISON:  CT of the head performed 07/22/2015 FINDINGS: There is no evidence of acute infarction, mass lesion,  or intra- or extra-axial hemorrhage on CT. The posterior fossa, including the cerebellum, brainstem and fourth ventricle, is within normal limits. The third and lateral ventricles, and basal ganglia are unremarkable in appearance. The cerebral hemispheres are symmetric in appearance, with normal gray-white differentiation. No mass effect or midline shift is seen. There is no evidence of fracture; visualized osseous structures are unremarkable in appearance. The orbits are within normal limits. There is partial opacification of the left maxillary sinus. There is mild partial opacification of the left mastoid air cells. The remaining paranasal sinuses and right mastoid air cells are well-aerated. No significant soft tissue abnormalities are seen. IMPRESSION: 1. No evidence of traumatic intracranial injury or fracture. 2. Partial opacification of the left maxillary sinus and mild partial opacification of the left mastoid air cells. Electronically Signed   By: Garald Balding M.D.   On: 07/24/2015 04:58   . aspirin EC  81 mg Oral Daily  . budesonide-formoterol  2 puff Inhalation BID  . calcium-vitamin D  1 tablet  Oral BID  . enoxaparin (LOVENOX) injection  40 mg Subcutaneous Q24H  . gabapentin  600 mg Oral TID  . insulin aspart  0-15 Units Subcutaneous 6 times per day  . insulin glargine  10 Units Subcutaneous QHS  . lamoTRIgine  125 mg Oral BID  . magnesium sulfate 1 - 4 g bolus IVPB  2 g Intravenous Once  . niacin  500 mg Oral QHS  . ondansetron (ZOFRAN) IV  4 mg Intravenous 4 times per day  . pantoprazole  40 mg Oral Daily  . pneumococcal 23 valent vaccine  0.5 mL Intramuscular Tomorrow-1000  . potassium chloride  20 mEq Oral Once  . potassium chloride  40 mEq Oral TID  . QUEtiapine  800 mg Oral QHS  . simvastatin  40 mg Oral q1800  . tiotropium  18 mcg Inhalation Daily  . tolvaptan  15 mg Oral Q24H  . zolpidem  5 mg Oral QHS    BMET    Component Value Date/Time   NA 123* 07/24/2015 0445   NA 121* 07/18/2015 1017   K 3.3* 07/24/2015 0445   K 2.7* 07/18/2015 1017   CL 87* 07/24/2015 0445   CO2 25 07/24/2015 0445   CO2 24 07/18/2015 1017   GLUCOSE 242* 07/24/2015 0445   GLUCOSE 215* 07/18/2015 1017   BUN 7 07/24/2015 0445   BUN 4.1* 07/18/2015 1017   CREATININE 0.60 07/24/2015 0445   CREATININE 0.7 07/18/2015 1017   CALCIUM 8.9 07/24/2015 0445   CALCIUM 9.0 07/18/2015 1017   GFRNONAA >60 07/24/2015 0445   GFRAA >60 07/24/2015 0445   CBC    Component Value Date/Time   WBC 7.2 07/23/2015 0745   WBC 5.6 07/18/2015 1017   RBC 4.16 07/23/2015 0745   RBC 4.04 07/18/2015 1017   HGB 12.5 07/23/2015 0745   HGB 12.0 07/18/2015 1017   HCT 33.8* 07/23/2015 0745   HCT 34.7* 07/18/2015 1017   PLT 305 07/23/2015 0745   PLT 277 07/18/2015 1017   MCV 81.3 07/23/2015 0745   MCV 85.9 07/18/2015 1017   MCH 30.0 07/23/2015 0745   MCH 29.8 07/18/2015 1017   MCHC 37.0* 07/23/2015 0745   MCHC 34.7 07/18/2015 1017   RDW 13.3 07/23/2015 0745   RDW 14.3 07/18/2015 1017   LYMPHSABS 1.5 07/18/2015 1017   LYMPHSABS 1.3 07/17/2015 1225   MONOABS 0.4 07/18/2015 1017   MONOABS 0.4 07/17/2015  1225   EOSABS 0.1 07/18/2015 1017  EOSABS 0.1 07/17/2015 1225   BASOSABS 0.0 07/18/2015 1017   BASOSABS 0.0 07/17/2015 1225     Assessment/Plan: 1. Hyponatremia- has history consistent with hypovolemia however also has small cell lung cancer and h/o SIADH. To further complicate picture, she has trace pedal edema. Agree with IVF's but need to repeat sodium levels STAT as they have been running for several hours and labs were ordered but not drawn. Tolvaptan was held until repeat labs performed and she did not receive a dose with improvement of serum sodium after IVF's alone.   1. Improved with IV normal saline and continues to improve without tolvaptan (although Uosm is inappropriately high as well as UNa but was on IV saline at the time and lower than her previous urine sodium levels).  Likely component of volume depletion at this presentation. 2. Will decrease rate of IVF's and follow Sodium levels 3. 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 4. Currently asymptomatic 2. Small cell lung cancer- stage 3 for chemoradiation later this week per Dr. Julien Nordmann.  3. Hypokalemia- replete and follow. 4. DM- per primary svc 5. COPD- stable 6. Bipolar disorder- continue with meds 7. Obesity 8. HTN- stable  Cheray Pardi A

## 2015-07-25 ENCOUNTER — Other Ambulatory Visit: Payer: Self-pay

## 2015-07-25 ENCOUNTER — Ambulatory Visit
Admission: RE | Admit: 2015-07-25 | Discharge: 2015-07-25 | Disposition: A | Discharge status: Home / Self Care | Payer: Medicaid Other | Source: Ambulatory Visit | Attending: Radiation Oncology | Admitting: Radiation Oncology

## 2015-07-25 ENCOUNTER — Telehealth: Payer: Self-pay | Admitting: *Deleted

## 2015-07-25 ENCOUNTER — Ambulatory Visit: Payer: Self-pay

## 2015-07-25 LAB — BASIC METABOLIC PANEL
ANION GAP: 8 (ref 5–15)
Anion gap: 8 (ref 5–15)
BUN: 7 mg/dL (ref 6–20)
BUN: 8 mg/dL (ref 6–20)
CALCIUM: 8.3 mg/dL — AB (ref 8.9–10.3)
CHLORIDE: 94 mmol/L — AB (ref 101–111)
CO2: 24 mmol/L (ref 22–32)
CO2: 25 mmol/L (ref 22–32)
CREATININE: 0.52 mg/dL (ref 0.44–1.00)
Calcium: 8.2 mg/dL — ABNORMAL LOW (ref 8.9–10.3)
Chloride: 94 mmol/L — ABNORMAL LOW (ref 101–111)
Creatinine, Ser: 0.58 mg/dL (ref 0.44–1.00)
GFR calc Af Amer: >60 mL/min (ref >60)
GFR calc non Af Amer: >60 mL/min (ref >60)
GFR calc non Af Amer: >60 mL/min (ref >60)
GLUCOSE: 196 mg/dL — AB (ref 65–99)
Glucose, Bld: 216 mg/dL — ABNORMAL HIGH (ref 65–99)
POTASSIUM: 4.7 mmol/L (ref 3.5–5.1)
POTASSIUM: 5.1 mmol/L (ref 3.5–5.1)
Sodium: 126 mmol/L — ABNORMAL LOW (ref 135–145)
Sodium: 127 mmol/L — ABNORMAL LOW (ref 135–145)

## 2015-07-25 LAB — CBC
HEMATOCRIT: 35.1 % — AB (ref 36.0–46.0)
HEMOGLOBIN: 12.3 g/dL (ref 12.0–15.0)
MCH: 29.8 pg (ref 26.0–34.0)
MCHC: 35 g/dL (ref 30.0–36.0)
MCV: 85 fL (ref 78.0–100.0)
Platelets: 333 10*3/uL (ref 150–400)
RBC: 4.13 MIL/uL (ref 3.87–5.11)
RDW: 13.7 % (ref 11.5–15.5)
WBC: 13.9 10*3/uL — ABNORMAL HIGH (ref 4.0–10.5)

## 2015-07-25 LAB — GLUCOSE, CAPILLARY
GLUCOSE-CAPILLARY: 195 mg/dL — AB (ref 65–99)
GLUCOSE-CAPILLARY: 202 mg/dL — AB (ref 65–99)
Glucose-Capillary: 230 mg/dL — ABNORMAL HIGH (ref 65–99)
Glucose-Capillary: 270 mg/dL — ABNORMAL HIGH (ref 65–99)

## 2015-07-25 LAB — MAGNESIUM: MAGNESIUM: 1.8 mg/dL (ref 1.7–2.4)

## 2015-07-25 MED ORDER — OXYCODONE-ACETAMINOPHEN 5-325 MG PO TABS: 1.0000 | ORAL_TABLET | Freq: Three times a day (TID) | ORAL | Status: DC | PRN

## 2015-07-25 MED ORDER — HEPARIN SOD (PORK) LOCK FLUSH 100 UNIT/ML IV SOLN
500.0000 [IU] | INTRAVENOUS | Status: AC | PRN
  Administered 2015-07-25: 500 [IU]

## 2015-07-25 MED ORDER — ZOLPIDEM TARTRATE 10 MG PO TABS: 5.0000 mg | ORAL_TABLET | Freq: Every day | ORAL | Status: DC

## 2015-07-25 MED ORDER — LORAZEPAM 0.5 MG PO TABS: 0.5000 mg | ORAL_TABLET | Freq: Three times a day (TID) | ORAL | Status: DC | PRN

## 2015-07-25 MED ORDER — MAGNESIUM OXIDE 400 (241.3 MG) MG PO TABS: 800.0000 mg | ORAL_TABLET | Freq: Three times a day (TID) | ORAL | Status: AC

## 2015-07-25 MED ORDER — POTASSIUM CHLORIDE ER 20 MEQ PO TBCR: 40.0000 meq | EXTENDED_RELEASE_TABLET | Freq: Two times a day (BID) | ORAL | Status: AC

## 2015-07-25 NOTE — Progress Notes (Signed)
Report attempted to [facility] for patient who is discharged

## 2015-07-25 NOTE — NC FL2 (Signed)
[place] MEDICAID FL2 LEVEL OF CARE SCREENING TOOL     IDENTIFICATION  Patient Name: Sarah Leonard Birthdate: Apr 25, 1961 Sex: female Admission Date (Current Location): 07/22/2015  [facility] and [place] Number: Herbalist and Address:  [facility] [place], [place] [address] [place], [place] [address]      Provider Number: [id number]  Attending Physician Name and Address:  Reyne Dumas, MD  Relative Name and Phone Number:  Shaune Pascal.  [phone]    Current Level of Care: Hospital Recommended Level of Care: [place] Prior Approval Number:    Date Approved/Denied:   PASRR Number:    Discharge Plan: Other (Comment) ([place])    Current Diagnoses: Patient Active Problem List   Diagnosis Date Noted  . Chronic hypokalemia 07/22/2015  . Malignant neoplasm of lower lobe of right lung /Stage IIIA (T1a, N2, M0)   . Poor venous access   . Cancer of lower lobe of right lung ([place]) 07/02/2015  . Mediastinal lymphadenopathy   . Intractable nausea and vomiting 06/17/2015  . Hypomagnesemia 06/17/2015  . GERD (gastroesophageal reflux disease) 06/17/2015  . SIADH (syndrome of inappropriate ADH production) ([place]) 06/08/2015  . Type 2 diabetes mellitus with hyperglycemia ([place]) 06/07/2015  . Hyperlipidemia 06/07/2015  . Headache 06/06/2015  . Chronic respiratory failure with hypoxia ([place]) 06/06/2015  . Bipolar I disorder ([place]) 06/06/2015  . Obesity 11/29/2014  . Tobacco abuse 11/29/2014  . Noncompliance with medications 11/29/2014  . Hypertension 11/25/2014  . COPD (chronic obstructive pulmonary disease) ([place])   . Axillary abscess 03/03/2013    Orientation RESPIRATION BLADDER Height & Weight    Self, Time, Situation, Place  Normal Continent '5\' 3"'$  (160 cm) 206 lbs.  BEHAVIORAL SYMPTOMS/MOOD NEUROLOGICAL BOWEL NUTRITION STATUS      Continent  (Regular)  AMBULATORY STATUS COMMUNICATION OF NEEDS Skin   Limited  Assist Verbally Normal                       Personal Care Assistance Level of Assistance  Bathing, Feeding Bathing Assistance: Limited assistance Feeding assistance: Independent       Functional Limitations Info             SPECIAL CARE FACTORS FREQUENCY                       Contractures Contractures Info: Not present    Additional Factors Info  Code Status, Allergies, Insulin Sliding Scale Code Status Info: Full Allergies Info: NKA   Insulin Sliding Scale Info: insulin aspart; insulin glargine       Current Medications (07/25/2015):    Discharge Medications: Medication List    STOP taking these medications       metFORMIN 1000 MG tablet  Commonly known as: GLUCOPHAGE      TAKE these medications       acetaminophen 325 MG tablet  Commonly known as: TYLENOL  Take 325 mg by mouth 3 (three) times daily. Prior to fish oil     albuterol 108 (90 BASE) MCG/ACT inhaler  Commonly known as: PROVENTIL HFA;VENTOLIN HFA  Inhale 2 puffs into the lungs every 4 (four) hours as needed for wheezing or shortness of breath.     aspirin EC 81 MG tablet  Take 1 tablet (81 mg total) by mouth daily.     budesonide-formoterol 80-4.5 MCG/ACT inhaler  Commonly known as: SYMBICORT  Inhale 2 puffs into the lungs 2 (two) times daily.  calcium-vitamin D 500-200 MG-UNIT tablet  Commonly known as: OSCAL WITH D  Take 1 tablet by mouth 2 (two) times daily.     cholecalciferol 1000 UNITS tablet  Commonly known as: VITAMIN D  Take 1,000 Units by mouth daily.     docusate sodium 100 MG capsule  Commonly known as: COLACE  Take 100 mg by mouth 2 (two) times daily.     gabapentin 300 MG capsule  Commonly known as: NEURONTIN  Take 600 mg by mouth 3 (three) times daily.     insulin aspart 100 UNIT/ML injection  Commonly known as: novoLOG  Inject 5 Units into the skin 3 (three) times daily with meals.      insulin glargine 100 UNIT/ML injection  Commonly known as: LANTUS  Inject 0.1 mLs (10 Units total) into the skin at bedtime.     ipratropium-albuterol 0.5-2.5 (3) MG/3ML Soln  Commonly known as: DUONEB  Take 3 mLs by nebulization every 6 (six) hours as needed.     lamoTRIgine 100 MG tablet  Commonly known as: LAMICTAL  Take 100 mg by mouth 2 (two) times daily. Take with 25 mg tablet     lamoTRIgine 25 MG tablet  Commonly known as: LAMICTAL  Take 25 mg by mouth 2 (two) times daily. Takes with '100mg'$  tablet     lidocaine-prilocaine cream  Commonly known as: EMLA  Apply 1 application topically as needed. Apply 1 hr prior to chemotherapy     LORazepam 0.5 MG tablet  Commonly known as: ATIVAN  Take 1 tablet (0.5 mg total) by mouth every 8 (eight) hours as needed for anxiety.     magnesium oxide 400 (241.3 MG) MG tablet  Commonly known as: MAG-OX  Take 2 tablets (800 mg total) by mouth 3 (three) times daily.     niacin 750 MG CR tablet  Commonly known as: NIASPAN  Take 750 mg by mouth at bedtime.     omeprazole 20 MG capsule  Commonly known as: PRILOSEC  Take 20 mg by mouth daily before supper.     oxyCODONE-acetaminophen 5-325 MG tablet  Commonly known as: PERCOCET/ROXICET  Take 1 tablet by mouth every 8 (eight) hours as needed for severe pain.     Potassium Chloride ER 20 MEQ Tbcr  Take 40 mEq by mouth 2 (two) times daily.  Start taking on: 07/26/2015     promethazine 12.5 MG tablet  Commonly known as: PHENERGAN  Take 12.5 mg by mouth every 6 (six) hours as needed for nausea or vomiting.     QUEtiapine 400 MG 24 hr tablet  Commonly known as: SEROQUEL XR  Take 800 mg by mouth at bedtime.     simvastatin 40 MG tablet  Commonly known as: ZOCOR  Take 40 mg by mouth daily at 6 PM.     tiotropium 18 MCG inhalation capsule  Commonly known as: SPIRIVA  Place 18 mcg into inhaler  and inhale daily.     zolpidem 10 MG tablet  Commonly known as: AMBIEN  Take 5 mg by mouth at bedtime.           Relevant Imaging Results:  Relevant Lab Results:   Additional [place] Maylon Sailors, LCSWA

## 2015-07-25 NOTE — Telephone Encounter (Signed)
Attempt to reach pt at [phone]- female answered and transferred call to pt. Line kept ringing without being answered. Unable to leave message. Called pt mobile# [email or web address] stating per MD she does not need labs on Friday, expect a call from scheduling regarding treatment date/times next week.

## 2015-07-25 NOTE — Progress Notes (Signed)
Sarah Leonard notified me that the patient is requesting the bed alarm to be removed.  Patient fell on 12/13 on night shift.  Ct was negative.  Patient has been educated and is still upset about having the bed alarm on.

## 2015-07-25 NOTE — Telephone Encounter (Signed)
I have called Margie the sister to discuss patient appts. Per the sister "my sister [name] stated that Devaeh was toi be moved from [facility] to [facility] via [facility] if her potassium level was good." I told Magie that I would leave a message the desk RN and either the doctor or nurse will call her back. Margie requested that we call [name] [phone] work or [phone] cell. I have left a voicemail with the desk RN.

## 2015-07-25 NOTE — Discharge Summary (Signed)
Physician Discharge Summary  Sarah Leonard MRN: [id number] DOB/AGE: 1960-09-05 54 y.o.  PCP: Moreen Fowler   Admit date: 07/22/2015 Discharge date: 07/25/2015  Discharge Diagnoses:     Principal Problem:   Intractable nausea and vomiting Active Problems:   Hypertension   COPD (chronic obstructive pulmonary disease) (HCC)   Headache   Chronic respiratory failure with hypoxia (HCC)   Bipolar I disorder (HCC)   Type 2 diabetes mellitus with hyperglycemia (HCC)   Hyperlipidemia   SIADH (syndrome of inappropriate ADH production) (HCC)   Hypomagnesemia   Mediastinal lymphadenopathy   Malignant neoplasm of lower lobe of right lung /Stage IIIA (T1a, N2, M0)   Chronic hypokalemia    Follow-up recommendations Follow-up with PCP in 3-5 days , including all  additional recommended appointments as below Follow-up CBC, CMP , magnesium, on 12/16 Patient to call oncology and confirm her chemo regimen       Medication List    STOP taking these medications        metFORMIN 1000 MG tablet  Commonly known as:  GLUCOPHAGE      TAKE these medications        acetaminophen 325 MG tablet  Commonly known as:  TYLENOL  Take 325 mg by mouth 3 (three) times daily. Prior to fish oil     albuterol 108 (90 BASE) MCG/ACT inhaler  Commonly known as:  PROVENTIL HFA;VENTOLIN HFA  Inhale 2 puffs into the lungs every 4 (four) hours as needed for wheezing or shortness of breath.     aspirin EC 81 MG tablet  Take 1 tablet (81 mg total) by mouth daily.     budesonide-formoterol 80-4.5 MCG/ACT inhaler  Commonly known as:  SYMBICORT  Inhale 2 puffs into the lungs 2 (two) times daily.     calcium-vitamin D 500-200 MG-UNIT tablet  Commonly known as:  OSCAL WITH D  Take 1 tablet by mouth 2 (two) times daily.     cholecalciferol 1000 UNITS tablet  Commonly known as:  VITAMIN D  Take 1,000 Units by mouth daily.     docusate sodium 100 MG capsule  Commonly known as:  COLACE  Take 100 mg  by mouth 2 (two) times daily.     gabapentin 300 MG capsule  Commonly known as:  NEURONTIN  Take 600 mg by mouth 3 (three) times daily.     insulin aspart 100 UNIT/ML injection  Commonly known as:  novoLOG  Inject 5 Units into the skin 3 (three) times daily with meals.     insulin glargine 100 UNIT/ML injection  Commonly known as:  LANTUS  Inject 0.1 mLs (10 Units total) into the skin at bedtime.     ipratropium-albuterol 0.5-2.5 (3) MG/3ML Soln  Commonly known as:  DUONEB  Take 3 mLs by nebulization every 6 (six) hours as needed.     lamoTRIgine 100 MG tablet  Commonly known as:  LAMICTAL  Take 100 mg by mouth 2 (two) times daily. Take with 25 mg tablet     lamoTRIgine 25 MG tablet  Commonly known as:  LAMICTAL  Take 25 mg by mouth 2 (two) times daily. Takes with '100mg'$  tablet     lidocaine-prilocaine cream  Commonly known as:  EMLA  Apply 1 application topically as needed. Apply 1 hr prior to chemotherapy     LORazepam 0.5 MG tablet  Commonly known as:  ATIVAN  Take 1 tablet (0.5 mg total) by mouth every 8 (eight) hours as needed for anxiety.  magnesium oxide 400 (241.3 MG) MG tablet  Commonly known as:  MAG-OX  Take 2 tablets (800 mg total) by mouth 3 (three) times daily.     niacin 750 MG CR tablet  Commonly known as:  NIASPAN  Take 750 mg by mouth at bedtime.     omeprazole 20 MG capsule  Commonly known as:  PRILOSEC  Take 20 mg by mouth daily before supper.     oxyCODONE-acetaminophen 5-325 MG tablet  Commonly known as:  PERCOCET/ROXICET  Take 1 tablet by mouth every 8 (eight) hours as needed for severe pain.     Potassium Chloride ER 20 MEQ Tbcr  Take 40 mEq by mouth 2 (two) times daily.  Start taking on:  07/26/2015     promethazine 12.5 MG tablet  Commonly known as:  PHENERGAN  Take 12.5 mg by mouth every 6 (six) hours as needed for nausea or vomiting.     QUEtiapine 400 MG 24 hr tablet  Commonly known as:  SEROQUEL XR  Take 800 mg by mouth at  bedtime.     simvastatin 40 MG tablet  Commonly known as:  ZOCOR  Take 40 mg by mouth daily at 6 PM.     tiotropium 18 MCG inhalation capsule  Commonly known as:  SPIRIVA  Place 18 mcg into inhaler and inhale daily.     zolpidem 10 MG tablet  Commonly known as:  AMBIEN  Take 5 mg by mouth at bedtime.         Discharge Condition: prognosis poor over all due to psyche issues and compliance with recommendations     Discharge Instructions       Discharge Instructions    Diet - low sodium heart healthy    Complete by:  As directed      Diet - low sodium heart healthy    Complete by:  As directed      Diet - low sodium heart healthy    Complete by:  As directed      Increase activity slowly    Complete by:  As directed      Increase activity slowly    Complete by:  As directed      Increase activity slowly    Complete by:  As directed            No Known Allergies    Disposition: 04-Intermediate Care Facility   Consults:  Nephrology    Significant Diagnostic Studies:  Ct Head Wo Contrast  07/24/2015  CLINICAL DATA:  Slipped and fell face forward, hitting left side of head. Headache. Initial encounter. EXAM: CT HEAD WITHOUT CONTRAST TECHNIQUE: Contiguous axial images were obtained from the base of the skull through the vertex without intravenous contrast. COMPARISON:  CT of the head performed 07/22/2015 FINDINGS: There is no evidence of acute infarction, mass lesion, or intra- or extra-axial hemorrhage on CT. The posterior fossa, including the cerebellum, brainstem and fourth ventricle, is within normal limits. The third and lateral ventricles, and basal ganglia are unremarkable in appearance. The cerebral hemispheres are symmetric in appearance, with normal gray-white differentiation. No mass effect or midline shift is seen. There is no evidence of fracture; visualized osseous structures are unremarkable in appearance. The orbits are within normal limits. There  is partial opacification of the left maxillary sinus. There is mild partial opacification of the left mastoid air cells. The remaining paranasal sinuses and right mastoid air cells are well-aerated. No significant soft tissue abnormalities are seen. IMPRESSION:  1. No evidence of traumatic intracranial injury or fracture. 2. Partial opacification of the left maxillary sinus and mild partial opacification of the left mastoid air cells. Electronically Signed   By: Garald Balding M.D.   On: 07/24/2015 04:58   Ct Head Wo Contrast  07/22/2015  CLINICAL DATA:  Small cell lung carcinoma.  Headache. EXAM: CT HEAD WITHOUT CONTRAST TECHNIQUE: Contiguous axial images were obtained from the base of the skull through the vertex without intravenous contrast. COMPARISON:  Head CT June 06, 2015; brain MRI June 26, 2015 FINDINGS: The ventricles are normal in size and configuration. There is no intracranial mass, hemorrhage, extra-axial fluid collection, or midline shift. There is mild small vessel disease in the centra semiovale bilaterally. There is no new gray-white compartment lesion. No acute infarct evident. The bony calvarium appears intact. There is patchy mastoid air cell disease on the left, stable. Mastoids on the right are clear. There is apparent cerumen in the right external auditory canal, a finding also present previously. There is a retention cyst in the posterior left maxillary antrum. There is ethmoid sinus mucosal thickening bilaterally. No intraorbital lesions are identified. IMPRESSION: Mild periventricular small vessel disease. No intracranial mass, hemorrhage, or extra-axial fluid collection. 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 Cerumen right external auditory canal. Areas of paranasal sinus disease as noted above. Electronically Signed   By: Lowella Grip III M.D.   On: 07/22/2015 08:05   Mr Jeri Cos YT Contrast  06/26/2015  CLINICAL DATA:  History of small cell lung cancer. History of  diabetes, cocaine abuse, abscess, septic shock. EXAM: MRI HEAD WITHOUT AND WITH CONTRAST TECHNIQUE: Multiplanar, multiecho pulse sequences of the brain and surrounding structures were obtained without and with intravenous contrast. CONTRAST:  78m MULTIHANCE GADOBENATE DIMEGLUMINE 529 MG/ML IV SOLN COMPARISON:  CT head June 06, 2015 FINDINGS: Mild motion degraded examination. The ventricles and sulci are normal for patient's age. No abnormal parenchymal signal, mass lesions, mass effect. Patchy supratentorial white matter T2 hyperintensities. No abnormal parenchymal enhancement. No reduced diffusion to suggest acute ischemia. No susceptibility artifact to suggest hemorrhage. No abnormal extra-axial fluid collections. No extra-axial masses nor leptomeningeal enhancement. Normal major intracranial vascular flow voids seen at the skull base. Ocular globes and orbital contents are unremarkable though not tailored for evaluation. No suspicious calvarial bone marrow signal. No abnormal sellar expansion. Craniocervical junction maintained. LEFT maxillary sinus air-fluid level. Small LEFT mastoid effusion. IMPRESSION: Mild motion degraded examination without acute intracranial process nor MR findings of intracranial metastasis. Mild to moderate white matter changes most often seen with chronic small vessel ischemic disease. Electronically Signed   By: CElon AlasM.D.   On: 06/26/2015 22:09   Nm Pet Image Initial (pi) Skull Base To Thigh  07/12/2015  CLINICAL DATA:  Initial treatment strategy for newly diagnosed small-cell lung carcinoma based on EBUS FNA of a subcarinal lymph node on 06/21/2015. EXAM: NUCLEAR MEDICINE PET SKULL BASE TO THIGH TECHNIQUE: 12.3 mCi F-18 FDG was injected intravenously. Full-ring PET imaging was performed from the skull base to thigh after the radiotracer. CT data was obtained and used for attenuation correction and anatomic localization. FASTING BLOOD GLUCOSE:  Value: 224 mg/dl  COMPARISON:  06/19/2015 chest CT.  06/17/2015 CT abdomen/ pelvis. FINDINGS: NECK No hypermetabolic lymph nodes in the neck. Mucous retention cyst versus polyp in the inferior posterior left maxillary sinus. CHEST There is a hypermetabolic 2.2 x 2.1 cm posterior right lower lobe pulmonary nodule (series 3/image 118) with max SUV 4.0,  increased in size from 1.5 x 1.3 cm on 06/19/2015. There is a suggestion of a new 0.6 cm anterior right lower lobe pulmonary nodule (3/123), below PET resolution. No acute consolidative airspace disease. Mosaic attenuation throughout the lungs. There are multiple hypermetabolic ipsilateral right peribronchial nodes, largest 2.0 cm (series 3/image 90) with max SUV 10.2. There is bulky subcarinal hypermetabolic adenopathy measuring 3.6 cm (series 3/image 93) with max SUV 11.2. There is a mildly hypermetabolic and mildly enlarged right lower paratracheal 1.1 cm node (series 3/image 87) with max SUV 4.3. No contralateral mediastinal, contralateral hilar or supraclavicular hypermetabolic adenopathy. No hypermetabolic axillary nodes. Left anterior descending and right coronary atherosclerosis. Mitral annular calcifications. ABDOMEN/PELVIS No abnormal hypermetabolic activity within the liver, pancreas, adrenal glands, or spleen. No hypermetabolic lymph nodes in the abdomen or pelvis. Mild diffuse hepatic steatosis. Status post cholecystectomy. Two clustered 3 mm nonobstructing stones in the lower left kidney. Malrotated left kidney. Mild fullness of the left renal collecting system without overt hydronephrosis. SKELETON No focal hypermetabolic activity to suggest skeletal metastasis. IMPRESSION: 1. Hypermetabolic 2.2 cm right lower lobe pulmonary nodule, in keeping with a primary bronchogenic carcinoma, which has grown since 06/19/2015. 2. Hypermetabolic ipsilateral peribronchial, subcarinal and ipsilateral paratracheal lymphadenopathy. 3. Suggestion of a new 6 mm right lower lobe pulmonary  nodule, below PET resolution, suspect an ipsilateral primary tumor lobe pulmonary metastasis. 4. No extrathoracic hypermetabolic metastatic disease. 5. TNM Stage IIIa by PET-CT (T3 due to suspected ipsilateral primary tumor lobe metastasis N2 M0). 6. Additional findings including 2 vessel coronary atherosclerosis, mild diffuse hepatic steatosis and nonobstructing tiny left renal stones. Electronically Signed   By: Ilona Sorrel M.D.   On: 07/12/2015 10:56   Ir Fluoro Guide Cv Line Right  07/18/2015  CLINICAL DATA:  Lung carcinoma, needs venous access for chemotherapy EXAM: TUNNELED PORT CATHETER PLACEMENT WITH ULTRASOUND AND FLUOROSCOPIC GUIDANCE FLUOROSCOPY TIME:  0.4 minutes, 286  uGym2 DAP ANESTHESIA/SEDATION: Intravenous Fentanyl and Versed were administered as conscious sedation during continuous cardiorespiratory monitoring by the radiology RN, with a total moderate sedation time of 17 minutes. TECHNIQUE: The procedure, risks, benefits, and alternatives were explained to the patient. Questions regarding the procedure were encouraged and answered. The patient understands and consents to the procedure. As antibiotic prophylaxis, cefazolin 2 g was ordered pre-procedure and administered intravenously within one hour of incision. Patency of the right IJ vein was confirmed with ultrasound with image documentation. An appropriate skin site was determined. Skin site was marked. Region was prepped using maximum barrier technique including cap and mask, sterile gown, sterile gloves, large sterile sheet, and Chlorhexidine as cutaneous antisepsis. The region was infiltrated locally with 1% lidocaine.Under real-time ultrasound guidance, the right IJ vein was accessed with a 21 gauge micropuncture needle; the needle tip within the vein was confirmed with ultrasound image documentation. Needle was exchanged over a 018 guidewire for transitional dilator which allowed passage of the [facility] wire into the IVC. Over this, the  transitional dilator was exchanged for a 5 [place] MPA catheter. A small incision was made on the right anterior chest wall and a subcutaneous pocket fashioned. The power-injectable port was positioned and its catheter tunneled to the right IJ dermatotomy site. The MPA catheter was exchanged over an Amplatz wire for a peel-away sheath, through which the port catheter, which had been trimmed to the appropriate length, was advanced and positioned under fluoroscopy with its tip at the cavoatrial junction. Spot chest radiograph confirms good catheter position and no pneumothorax. The pocket was  closed with deep interrupted and subcuticular continuous 3-0 Monocryl sutures. The port was flushed per protocol. The incisions were covered with Dermabond then covered with a sterile dressing. COMPLICATIONS: COMPLICATIONS None immediate IMPRESSION: Technically successful right IJ power-injectable port catheter placement. Ready for routine use. Electronically Signed   By: Lucrezia Europe M.D.   On: 07/18/2015 09:29   Ir US Guide Vasc Access Right  07/18/2015  CLINICAL DATA:  Lung carcinoma, needs venous access for chemotherapy EXAM: TUNNELED PORT CATHETER PLACEMENT WITH ULTRASOUND AND FLUOROSCOPIC GUIDANCE FLUOROSCOPY TIME:  0.4 minutes, 286  uGym2 DAP ANESTHESIA/SEDATION: Intravenous Fentanyl and Versed were administered as conscious sedation during continuous cardiorespiratory monitoring by the radiology RN, with a total moderate sedation time of 17 minutes. TECHNIQUE: The procedure, risks, benefits, and alternatives were explained to the patient. Questions regarding the procedure were encouraged and answered. The patient understands and consents to the procedure. As antibiotic prophylaxis, cefazolin 2 g was ordered pre-procedure and administered intravenously within one hour of incision. Patency of the right IJ vein was confirmed with ultrasound with image documentation. An appropriate skin site was determined. Skin site was  marked. Region was prepped using maximum barrier technique including cap and mask, sterile gown, sterile gloves, large sterile sheet, and Chlorhexidine as cutaneous antisepsis. The region was infiltrated locally with 1% lidocaine.Under real-time ultrasound guidance, the right IJ vein was accessed with a 21 gauge micropuncture needle; the needle tip within the vein was confirmed with ultrasound image documentation. Needle was exchanged over a 018 guidewire for transitional dilator which allowed passage of the [facility] wire into the IVC. Over this, the transitional dilator was exchanged for a 5 [place] MPA catheter. A small incision was made on the right anterior chest wall and a subcutaneous pocket fashioned. The power-injectable port was positioned and its catheter tunneled to the right IJ dermatotomy site. The MPA catheter was exchanged over an Amplatz wire for a peel-away sheath, through which the port catheter, which had been trimmed to the appropriate length, was advanced and positioned under fluoroscopy with its tip at the cavoatrial junction. Spot chest radiograph confirms good catheter position and no pneumothorax. The pocket was closed with deep interrupted and subcuticular continuous 3-0 Monocryl sutures. The port was flushed per protocol. The incisions were covered with Dermabond then covered with a sterile dressing. COMPLICATIONS: COMPLICATIONS None immediate IMPRESSION: Technically successful right IJ power-injectable port catheter placement. Ready for routine use. Electronically Signed   By: Lucrezia Europe M.D.   On: 07/18/2015 09:29          Filed Weights   07/22/15 1238  Weight: 93.849 kg (206 lb 14.4 oz)     Microbiology: Recent Results (from the past 240 hour(s))  MRSA PCR Screening     Status: None   Collection Time: 07/23/15  1:02 AM  Result Value Ref Range Status   MRSA by PCR NEGATIVE NEGATIVE Final    Comment:        The GeneXpert MRSA Assay (FDA approved for NASAL  specimens only), is one component of a comprehensive MRSA colonization surveillance program. It is not intended to diagnose MRSA infection nor to guide or monitor treatment for MRSA infections.        Blood Culture    Component Value Date/Time   SDES STOOL 06/17/2015 0915   SPECREQUEST NONE 06/17/2015 0915   CULT  06/17/2015 0915    NO SALMONELLA, SHIGELLA, CAMPYLOBACTER, YERSINIA, OR E.COLI 0157:H7 ISOLATED Performed at [place] 06/22/2015 FINAL  06/17/2015 0915      Labs: Results for orders placed or performed during the hospital encounter of 07/22/15 (from the past 48 hour(s))  Glucose, capillary     Status: Abnormal   Collection Time: 07/23/15  4:48 PM  Result Value Ref Range   Glucose-Capillary 261 (H) 65 - 99 mg/dL  Basic metabolic panel     Status: Abnormal   Collection Time: 07/23/15  5:40 PM  Result Value Ref Range   Sodium 120 (L) 135 - 145 mmol/L   Potassium 3.5 3.5 - 5.1 mmol/L   Chloride 82 (L) 101 - 111 mmol/L   CO2 26 22 - 32 mmol/L   Glucose, Bld 256 (H) 65 - 99 mg/dL   BUN 8 6 - 20 mg/dL   Creatinine, Ser 0.59 0.44 - 1.00 mg/dL   Calcium 9.7 8.9 - 10.3 mg/dL   GFR calc non Af Amer >60 >60 mL/min   GFR calc Af Amer >60 >60 mL/min    Comment: (NOTE) The eGFR has been calculated using the CKD EPI equation. This calculation has not been validated in all clinical situations. eGFR's persistently <60 mL/min signify possible Chronic Kidney Disease.    Anion gap 12 5 - 15  Osmolality     Status: Abnormal   Collection Time: 07/23/15  5:40 PM  Result Value Ref Range   Osmolality 255 (L) 275 - 295 mOsm/kg    Comment: Please note change in reference range.  Cortisol     Status: None   Collection Time: 07/23/15  5:40 PM  Result Value Ref Range   Cortisol, Plasma 27.2 ug/dL    Comment: (NOTE) AM    6.7 - 22.6 ug/dL PM   <10.0       ug/dL   TSH     Status: None   Collection Time: 07/23/15  5:40 PM  Result Value Ref Range    TSH 0.398 0.350 - 4.500 uIU/mL  Renal function panel     Status: Abnormal   Collection Time: 07/23/15  5:40 PM  Result Value Ref Range   Sodium 120 (L) 135 - 145 mmol/L   Potassium 3.6 3.5 - 5.1 mmol/L   Chloride 82 (L) 101 - 111 mmol/L   CO2 27 22 - 32 mmol/L   Glucose, Bld 255 (H) 65 - 99 mg/dL   BUN 8 6 - 20 mg/dL   Creatinine, Ser 0.56 0.44 - 1.00 mg/dL   Calcium 9.6 8.9 - 10.3 mg/dL   Phosphorus 3.1 2.5 - 4.6 mg/dL   Albumin 3.3 (L) 3.5 - 5.0 g/dL   GFR calc non Af Amer >60 >60 mL/min   GFR calc Af Amer >60 >60 mL/min    Comment: (NOTE) The eGFR has been calculated using the CKD EPI equation. This calculation has not been validated in all clinical situations. eGFR's persistently <60 mL/min signify possible Chronic Kidney Disease.    Anion gap 11 5 - 15  Osmolality, urine     Status: None   Collection Time: 07/23/15  8:41 PM  Result Value Ref Range   Osmolality, Ur 341 300 - 900 mOsm/kg    Comment: Please note change in reference range.  Sodium, urine, random     Status: None   Collection Time: 07/23/15  8:42 PM  Result Value Ref Range   Sodium, Ur 41 mmol/L  Glucose, capillary     Status: Abnormal   Collection Time: 07/23/15  9:05 PM  Result Value Ref Range   Glucose-Capillary 266 (H)  65 - 99 mg/dL  Basic metabolic panel     Status: Abnormal   Collection Time: 07/23/15  9:52 PM  Result Value Ref Range   Sodium 122 (L) 135 - 145 mmol/L   Potassium 3.6 3.5 - 5.1 mmol/L   Chloride 84 (L) 101 - 111 mmol/L   CO2 27 22 - 32 mmol/L   Glucose, Bld 258 (H) 65 - 99 mg/dL   BUN 8 6 - 20 mg/dL   Creatinine, Ser 0.68 0.44 - 1.00 mg/dL   Calcium 9.2 8.9 - 10.3 mg/dL   GFR calc non Af Amer >60 >60 mL/min   GFR calc Af Amer >60 >60 mL/min    Comment: (NOTE) The eGFR has been calculated using the CKD EPI equation. This calculation has not been validated in all clinical situations. eGFR's persistently <60 mL/min signify possible Chronic Kidney Disease.    Anion gap 11 5 - 15   Glucose, capillary     Status: Abnormal   Collection Time: 07/24/15 12:43 AM  Result Value Ref Range   Glucose-Capillary 298 (H) 65 - 99 mg/dL  Glucose, capillary     Status: Abnormal   Collection Time: 07/24/15  3:56 AM  Result Value Ref Range   Glucose-Capillary 250 (H) 65 - 99 mg/dL  Magnesium     Status: Abnormal   Collection Time: 07/24/15  4:45 AM  Result Value Ref Range   Magnesium 1.5 (L) 1.7 - 2.4 mg/dL  Basic metabolic panel     Status: Abnormal   Collection Time: 07/24/15  4:45 AM  Result Value Ref Range   Sodium 123 (L) 135 - 145 mmol/L   Potassium 3.3 (L) 3.5 - 5.1 mmol/L   Chloride 87 (L) 101 - 111 mmol/L   CO2 25 22 - 32 mmol/L   Glucose, Bld 242 (H) 65 - 99 mg/dL   BUN 7 6 - 20 mg/dL   Creatinine, Ser 0.60 0.44 - 1.00 mg/dL   Calcium 8.9 8.9 - 10.3 mg/dL   GFR calc non Af Amer >60 >60 mL/min   GFR calc Af Amer >60 >60 mL/min    Comment: (NOTE) The eGFR has been calculated using the CKD EPI equation. This calculation has not been validated in all clinical situations. eGFR's persistently <60 mL/min signify possible Chronic Kidney Disease.    Anion gap 11 5 - 15  Glucose, capillary     Status: Abnormal   Collection Time: 07/24/15  8:19 AM  Result Value Ref Range   Glucose-Capillary 232 (H) 65 - 99 mg/dL  Glucose, capillary     Status: Abnormal   Collection Time: 07/24/15 11:37 AM  Result Value Ref Range   Glucose-Capillary 243 (H) 65 - 99 mg/dL  Basic metabolic panel     Status: Abnormal   Collection Time: 07/24/15  1:50 PM  Result Value Ref Range   Sodium 126 (L) 135 - 145 mmol/L   Potassium 4.0 3.5 - 5.1 mmol/L    Comment: DELTA CHECK NOTED   Chloride 91 (L) 101 - 111 mmol/L   CO2 25 22 - 32 mmol/L   Glucose, Bld 259 (H) 65 - 99 mg/dL   BUN 7 6 - 20 mg/dL   Creatinine, Ser 0.60 0.44 - 1.00 mg/dL   Calcium 9.0 8.9 - 10.3 mg/dL   GFR calc non Af Amer >60 >60 mL/min   GFR calc Af Amer >60 >60 mL/min    Comment: (NOTE) The eGFR has been calculated  using the CKD EPI equation.  This calculation has not been validated in all clinical situations. eGFR's persistently <60 mL/min signify possible Chronic Kidney Disease.    Anion gap 10 5 - 15  Glucose, capillary     Status: Abnormal   Collection Time: 07/24/15  5:28 PM  Result Value Ref Range   Glucose-Capillary 274 (H) 65 - 99 mg/dL  Basic metabolic panel     Status: Abnormal   Collection Time: 07/24/15  7:35 PM  Result Value Ref Range   Sodium 125 (L) 135 - 145 mmol/L   Potassium 4.5 3.5 - 5.1 mmol/L   Chloride 92 (L) 101 - 111 mmol/L   CO2 26 22 - 32 mmol/L   Glucose, Bld 297 (H) 65 - 99 mg/dL   BUN 8 6 - 20 mg/dL   Creatinine, Ser 7.50 0.44 - 1.00 mg/dL   Calcium 8.5 (L) 8.9 - 10.3 mg/dL   GFR calc non Af Amer >60 >60 mL/min   GFR calc Af Amer >60 >60 mL/min    Comment: (NOTE) The eGFR has been calculated using the CKD EPI equation. This calculation has not been validated in all clinical situations. eGFR's persistently <60 mL/min signify possible Chronic Kidney Disease.    Anion gap 7 5 - 15  Glucose, capillary     Status: Abnormal   Collection Time: 07/24/15  8:08 PM  Result Value Ref Range   Glucose-Capillary 254 (H) 65 - 99 mg/dL   Comment 1 Notify RN   Glucose, capillary     Status: Abnormal   Collection Time: 07/24/15 11:56 PM  Result Value Ref Range   Glucose-Capillary 202 (H) 65 - 99 mg/dL   Comment 1 Notify RN   Basic metabolic panel     Status: Abnormal   Collection Time: 07/25/15  1:25 AM  Result Value Ref Range   Sodium 126 (L) 135 - 145 mmol/L   Potassium 4.7 3.5 - 5.1 mmol/L   Chloride 94 (L) 101 - 111 mmol/L   CO2 24 22 - 32 mmol/L   Glucose, Bld 196 (H) 65 - 99 mg/dL   BUN 7 6 - 20 mg/dL   Creatinine, Ser 4.23 0.44 - 1.00 mg/dL   Calcium 8.3 (L) 8.9 - 10.3 mg/dL   GFR calc non Af Amer >60 >60 mL/min   GFR calc Af Amer >60 >60 mL/min    Comment: (NOTE) The eGFR has been calculated using the CKD EPI equation. This calculation has not been  validated in all clinical situations. eGFR's persistently <60 mL/min signify possible Chronic Kidney Disease.    Anion gap 8 5 - 15  CBC     Status: Abnormal   Collection Time: 07/25/15  1:25 AM  Result Value Ref Range   WBC 13.9 (H) 4.0 - 10.5 K/uL   RBC 4.13 3.87 - 5.11 MIL/uL   Hemoglobin 12.3 12.0 - 15.0 g/dL   HCT 78.2 (L) 76.9 - 70.4 %   MCV 85.0 78.0 - 100.0 fL   MCH 29.8 26.0 - 34.0 pg   MCHC 35.0 30.0 - 36.0 g/dL   RDW 44.4 83.1 - 27.9 %   Platelets 333 150 - 400 K/uL  Magnesium     Status: None   Collection Time: 07/25/15  1:25 AM  Result Value Ref Range   Magnesium 1.8 1.7 - 2.4 mg/dL  Glucose, capillary     Status: Abnormal   Collection Time: 07/25/15  3:35 AM  Result Value Ref Range   Glucose-Capillary 195 (H) 65 - 99 mg/dL  Comment 1 Notify RN   Basic metabolic panel     Status: Abnormal   Collection Time: 07/25/15  6:39 AM  Result Value Ref Range   Sodium 127 (L) 135 - 145 mmol/L   Potassium 5.1 3.5 - 5.1 mmol/L   Chloride 94 (L) 101 - 111 mmol/L   CO2 25 22 - 32 mmol/L   Glucose, Bld 216 (H) 65 - 99 mg/dL   BUN 8 6 - 20 mg/dL   Creatinine, Ser 0.52 0.44 - 1.00 mg/dL   Calcium 8.2 (L) 8.9 - 10.3 mg/dL   GFR calc non Af Amer >60 >60 mL/min   GFR calc Af Amer >60 >60 mL/min    Comment: (NOTE) The eGFR has been calculated using the CKD EPI equation. This calculation has not been validated in all clinical situations. eGFR's persistently <60 mL/min signify possible Chronic Kidney Disease.    Anion gap 8 5 - 15  Glucose, capillary     Status: Abnormal   Collection Time: 07/25/15  8:08 AM  Result Value Ref Range   Glucose-Capillary 230 (H) 65 - 99 mg/dL  Glucose, capillary     Status: Abnormal   Collection Time: 07/25/15 12:24 PM  Result Value Ref Range   Glucose-Capillary 270 (H) 65 - 99 mg/dL     Lipid Panel     Component Value Date/Time   CHOL 116 06/07/2015 0343   TRIG 155* 06/07/2015 0343   HDL 50 06/07/2015 0343   CHOLHDL 2.3 06/07/2015  0343   VLDL 31 06/07/2015 0343   LDLCALC 35 06/07/2015 0343     Lab Results  Component Value Date   HGBA1C 9.3* 07/22/2015   HGBA1C 10.4* 06/06/2015   HGBA1C 12.2* 11/25/2014     Lab Results  Component Value Date   LDLCALC 35 06/07/2015   CREATININE 0.52 07/25/2015     HPI This is a 54 year old female patient recently diagnosed with Stage IIIA (T1a, N2, 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 She has been evaluated as an outpatient by radiation oncology as well as Dr. Julien Nordmann with medical oncology and plans are to start chemotherapy next week. She has multiple chronic electrolyte abnormalities including chronic hyponatremia and hypochloremia in the setting of diagnosed SIADH, chronic hypokalemia and chronic hypomagnesemia. She was last discharged on 11/11 to assisted living facility after an admission for nausea vomiting and diarrhea with worsening of her underlying hyponatremia. 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 At that time she did not carry a diagnosis of pulmonary carcinoma. She was given a one-time dose of Samsca with improvement in her sodium to 131 and at time of discharge her baseline sodium was documented as being in the 120s. Since discharge her sodium has averaged between 118 and 123. She returns to the ER today in the setting of headache with associated intractable nausea and vomiting. Patient reports a history of migraines in the past. She primarily describes this headache as being on the "top" of her head although by my evaluation this discomfort had migrated to the left periorbital agent. She's not had any fevers, no neck stiffness or rigidity, no recent falls or traumas, no visual changes or focal neurological deficits, no change in functional ability, sinus  infection type symptoms including drainage or pain. She reports she has been having  excessive episodes of vomiting for at least 4-5 days. She's not had any GI symptoms or abdominal pain and no diarrhea. She has not started chemotherapy.  In the ER the patient was afebrile, somewhat mildly hypertensive with a blood pressure of 175/92, pulse was 94, respirations 24 room air saturations were 95%. Patient reports she wears nasal cannula oxygen at home and subsequently 3 L oxygen was applied. Noncontrasted CT the head was completed and was unremarkable. Patient did undergo MRI of the brain with and without contrast on 11/15 during previous admission and this did not demonstrate any areas of metastatic disease. Sodium was 120, potassium lower than usual at 2.6 with typical ranges since discharge between 3.1 and 3.2 on low-dose potassium supplementation. BUN was less than 5 chloride low at 78, CBC normal, magnesium very low at 0.9 with previous readings ranging between 1.0 when last checked November 2016. Glucose somewhat elevated at 208.  Upon my evaluation of the patient she reports her nausea has resolved and she is requesting ice cream to eat.  Assessment and plan Intractable nausea and vomiting 2/2 Headache due to hyponatremia  Could be related to hyponatremia, improved. Improved with IV normal saline and continues to improve without tolvaptan , she did not receive any samsca during this admission Likely had a  component of volume depletion  In the setting of nausea / vomitting  -Clinically appeared  volume depleted so rx with  IV fluids,  Also has a history of SIADH -Scheduled IV Zofran with as needed IV Phenergan for intractable nausea and vomiting   :   SIADH with chronic hyponatremia -Baseline appears to be between 120 and 123, intermittently low since 2014-  continue fluid restriction. Sodium Improved with IV normal saline   without the need for tolvaptan. Likely component of volume depletion  this admission  Will decrease rate of IVF's and follow Sodium levels. Hopefully her chronic SIADH will improve with chemoradiation therapy.Anticipated to have chemo on 12/20 -Previously etiology suspected related to psych meds but suspect underlying malignancy primary causative factor  Pharmacy review psych medications, not contributing to hyponatremia at this point Sodium stable around 127 at the time of DC    Hypomagnesemia/Chronic hypokalemia -IV   Magnesium given 3 times this admission, mg was 0.9, now 1.8  Continue mg oxide TID and recheck  k 2.6 this admission , 5.1 on the day of DC    Chronic respiratory failure with hypoxia /COPD -Continue home oxygen -Continue preadmission nebs and MDIs -Compensated at this juncture although with some mild wheezing     Type 2 diabetes mellitus with hyperglycemia  Continue Lantus and held  metformin Titrate lantus and dc metformin as CBG in the 200's ,A1C>9.0   Mediastinal lymphadenopathy/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 Earlie Server of medical oncology and Dr. Lisbeth Renshaw of radiation oncology -Plan is to begin chemotherapy on December 20th  as an outpatient Called oncology and they will make recommendations about when to start chemo    Hypertension -Currently moderately controlled -Was not on antihypertensive medications prior to admission   Bipolar I disorder ([place]) -Continue preadmission Ativan, Lamictal, Seroquel -Is somewhat confused but not agitated or manic   Hyperlipidemia -Continue statin     Discharge Exam:    Blood pressure 167/96, pulse 104, temperature 98 F (36.7 C), temperature source Oral, resp. rate 20, height '5\' 3"'$  (1.6 m), weight 93.849 kg (206 lb 14.4 oz), SpO2 99 %.  General: No acute respiratory distress Lungs: Clear to auscultation bilaterally without wheezes or crackles Cardiovascular: Regular rate and rhythm without murmur gallop or  rub normal S1 and S2 Abdomen: Nontender, nondistended, soft, bowel sounds positive, no rebound, no ascites, no appreciable mass Extremities: No significant cyanosis, clubbing, or edema bilateral lower extremities     Follow-up Information    Follow up with [facility].   Why:  Your rolator (rolling walker with seat) and tub bench will be mailed to your residence at [facility]. It should arrive 12/14 to 12/16.    Contact information:   [address] [place] [address] [phone]       Follow up with Moreen Fowler Schedule an appointment as soon as possible for a visit in 3 days.   Specialty:  Family Medicine   Contact information:   [place] [address] [phone]       Signed: Reyne Dumas 07/25/2015, 12:55 PM        Time spent >45 mins

## 2015-07-25 NOTE — Clinical Social Work Note (Signed)
Clinical Social Work Assessment  Patient Details  Name: Sarah Leonard MRN: [id number] Date of Birth: 08/13/1960  Date of referral:  07/25/15               Reason for consult:  Facility Placement                Permission sought to share information with:  Facility Contact Representative Permission granted to share information::  Yes, Verbal Permission Granted  Name::     N/A  Agency::  [facility]  Relationship::     Contact Information:     Housing/Transportation Living arrangements for the past 2 months:  Assisted Living Facility Source of Information:  Patient Patient Interpreter Needed:  None Criminal Activity/Legal Involvement Pertinent to Current Situation/Hospitalization:  No - Comment as needed Significant Relationships:  Siblings Lives with:  Self Do you feel safe going back to the place where you live?  Yes Need for family participation in patient care:  No (Coment)  Care giving concerns:  CSW received referral for discharge placement. CSW met with patient regarding placement at time of discharge. Patient is agreeable to return to [facility] at time of discharge. CSW to continue to follow and assist with discharge planning needs.   Social Worker assessment / plan:  CSW spoke with patient regarding discharge planning back to [facility].  Employment status:  Disabled (Comment on whether or not currently receiving Disability) Insurance information:  Medicaid In State PT Recommendations:  Not assessed at this time Information / Referral to community resources:     Patient/Family's Response to care:  Patient agreeable to discharging to [facility]. Patient is anxious to be discharged today.  Patient/Family's Understanding of and Emotional Response to Diagnosis, Current Treatment, and Prognosis:  Patient is realistic regarding discharge. No questions/concerns about plan or treatment.    Emotional Assessment Appearance:  Appears stated  age Attitude/Demeanor/Rapport:  Complaining Affect (typically observed):  Appropriate, Irritable Orientation:  Oriented to Self, Oriented to Place, Oriented to  Time, Oriented to Situation Alcohol / Substance use:  Not Applicable Psych involvement (Current and /or in the community):  No (Comment)  Discharge Needs  Concerns to be addressed:  No discharge needs identified Readmission within the last 30 days:  No Current discharge risk:  None Barriers to Discharge:  No Barriers Identified    S , LCSWA 07/25/2015, 12:48 PM  

## 2015-07-25 NOTE — Progress Notes (Signed)
Patient has been very difficult to deal with this morning.  She is very agitated and wants to be discharged.  Patient threatened to leave AMA.  Talked with patient about the risks of leaving AMA.  Patient verbalized understand and became more corporative. Will continue to monitor and explain things to the patient to keep her informed

## 2015-07-25 NOTE — Progress Notes (Addendum)
Patient's Potassium is 5.1.  Paged Dr. Allyson Sabal to notify about K and to receive further orders     12:17 PM orders received to discontinue K orders

## 2015-07-25 NOTE — Progress Notes (Signed)
Patient's PCP listed as having office in [place]. Patient stated she did not know where her doctor's office is. Patient is seen at [facility]. CM left message with scheduler at [facility]. To call patient to schedule BMP for Friday.

## 2015-07-25 NOTE — Progress Notes (Signed)
Patient ID: SERRA YOUNAN, female   DOB: 12-Feb-1961, 54 y.o.   MRN: [id number] S:feels better and anxious to go home O:BP 167/96 mmHg  Pulse 104  Temp(Src) 98 F (36.7 C) (Oral)  Resp 20  Ht '5\' 3"'$  (1.6 m)  Wt 93.849 kg (206 lb 14.4 oz)  BMI 36.66 kg/m2  SpO2 96%  Intake/Output Summary (Last 24 hours) at 07/25/15 0838 Last data filed at 07/25/15 0810  Gross per 24 hour  Intake      0 ml  Output   2150 ml  Net  -2150 ml   Intake/Output: I/O last 3 completed shifts: In: -  Out: 3150 [Urine:3150]  Intake/Output this shift:  Total I/O In: -  Out: 500 [Urine:500] Weight change:  Gen:WD obese WF in NAd RFF:MBWGY, no rub Resp:occ rhonchi and exp wheezes bilaterally KZL:DJTTS +bs, soft, NT Ext:no edema   Recent Labs Lab 07/18/15 1017 07/22/15 0647  07/23/15 0745 07/23/15 1740 07/23/15 2152 07/24/15 0445 07/24/15 1350 07/24/15 1935 07/25/15 0125  NA 121* 120*  < > 120* 120*  120* 122* 123* 126* 125* 126*  K 2.7* 2.6*  < > 3.8 3.6  3.5 3.6 3.3* 4.0 4.5 4.7  CL  --  78*  < > 83* 82*  82* 84* 87* 91* 92* 94*  CO2 24 28  < > '24 27  26 27 25 25 26 24  '$ GLUCOSE 215* 208*  < > 200* 255*  256* 258* 242* 259* 297* 196*  BUN 4.1* <5*  < > '6 8  8 8 7 7 8 7  '$ CREATININE 0.7 0.53  < > 0.40* 0.56  0.59 0.68 0.60 0.60 0.69 0.58  ALBUMIN 3.3* 3.3*  --  3.0* 3.3*  --   --   --   --   --   CALCIUM 9.0 9.6  < > 9.5 9.6  9.7 9.2 8.9 9.0 8.5* 8.3*  PHOS  --   --   --  4.3 3.1  --   --   --   --   --   AST 19 34  --  37  --   --   --   --   --   --   ALT 13 22  --  20  --   --   --   --   --   --   < > = values in this interval not displayed. Liver Function Tests:  Recent Labs Lab 07/18/15 1017 07/22/15 0647 07/23/15 0745 07/23/15 1740  AST 19 34 37  --   ALT '13 22 20  '$ --   ALKPHOS 97 104 104  --   BILITOT 0.34 0.6 0.7  --   PROT 6.7 7.0 6.3*  --   ALBUMIN 3.3* 3.3* 3.0* 3.3*   No results for input(s): LIPASE, AMYLASE in the last 168 hours. No results for input(s):  AMMONIA in the last 168 hours. CBC:  Recent Labs Lab 07/18/15 1017 07/22/15 0647 07/23/15 0745 07/25/15 0125  WBC 5.6 8.1 7.2 13.9*  NEUTROABS 3.6  --   --   --   HGB 12.0 12.7 12.5 12.3  HCT 34.7* 35.8* 33.8* 35.1*  MCV 85.9 83.3 81.3 85.0  PLT 277 313 305 333   Cardiac Enzymes: No results for input(s): CKTOTAL, CKMB, CKMBINDEX, TROPONINI in the last 168 hours. CBG:  Recent Labs Lab 07/24/15 1728 07/24/15 2008 07/24/15 2356 07/25/15 0335 07/25/15 0808  GLUCAP 274* 254* 202* 195* 230*  Iron Studies: No results for input(s): IRON, TIBC, TRANSFERRIN, FERRITIN in the last 72 hours. Studies/Results: Ct Head Wo Contrast  07/24/2015  CLINICAL DATA:  Slipped and fell face forward, hitting left side of head. Headache. Initial encounter. EXAM: CT HEAD WITHOUT CONTRAST TECHNIQUE: Contiguous axial images were obtained from the base of the skull through the vertex without intravenous contrast. COMPARISON:  CT of the head performed 07/22/2015 FINDINGS: There is no evidence of acute infarction, mass lesion, or intra- or extra-axial hemorrhage on CT. The posterior fossa, including the cerebellum, brainstem and fourth ventricle, is within normal limits. The third and lateral ventricles, and basal ganglia are unremarkable in appearance. The cerebral hemispheres are symmetric in appearance, with normal gray-white differentiation. No mass effect or midline shift is seen. There is no evidence of fracture; visualized osseous structures are unremarkable in appearance. The orbits are within normal limits. There is partial opacification of the left maxillary sinus. There is mild partial opacification of the left mastoid air cells. The remaining paranasal sinuses and right mastoid air cells are well-aerated. No significant soft tissue abnormalities are seen. IMPRESSION: 1. No evidence of traumatic intracranial injury or fracture. 2. Partial opacification of the left maxillary sinus and mild partial  opacification of the left mastoid air cells. Electronically Signed   By: Garald Balding M.D.   On: 07/24/2015 04:58   . aspirin EC  81 mg Oral Daily  . budesonide-formoterol  2 puff Inhalation BID  . calcium-vitamin D  1 tablet Oral BID  . enoxaparin (LOVENOX) injection  40 mg Subcutaneous Q24H  . gabapentin  600 mg Oral TID  . insulin aspart  0-15 Units Subcutaneous 6 times per day  . insulin glargine  10 Units Subcutaneous QHS  . lamoTRIgine  125 mg Oral BID  . magnesium oxide  800 mg Oral BID  . niacin  500 mg Oral QHS  . ondansetron (ZOFRAN) IV  4 mg Intravenous 4 times per day  . pantoprazole  40 mg Oral Daily  . pneumococcal 23 valent vaccine  0.5 mL Intramuscular Tomorrow-1000  . potassium chloride  60 mEq Oral TID  . QUEtiapine  800 mg Oral QHS  . simvastatin  40 mg Oral q1800  . tiotropium  18 mcg Inhalation Daily  . zolpidem  5 mg Oral QHS    BMET    Component Value Date/Time   NA 126* 07/25/2015 0125   NA 121* 07/18/2015 1017   K 4.7 07/25/2015 0125   K 2.7* 07/18/2015 1017   CL 94* 07/25/2015 0125   CO2 24 07/25/2015 0125   CO2 24 07/18/2015 1017   GLUCOSE 196* 07/25/2015 0125   GLUCOSE 215* 07/18/2015 1017   BUN 7 07/25/2015 0125   BUN 4.1* 07/18/2015 1017   CREATININE 0.58 07/25/2015 0125   CREATININE 0.7 07/18/2015 1017   CALCIUM 8.3* 07/25/2015 0125   CALCIUM 9.0 07/18/2015 1017   GFRNONAA >60 07/25/2015 0125   GFRAA >60 07/25/2015 0125   CBC    Component Value Date/Time   WBC 13.9* 07/25/2015 0125   WBC 5.6 07/18/2015 1017   RBC 4.13 07/25/2015 0125   RBC 4.04 07/18/2015 1017   HGB 12.3 07/25/2015 0125   HGB 12.0 07/18/2015 1017   HCT 35.1* 07/25/2015 0125   HCT 34.7* 07/18/2015 1017   PLT 333 07/25/2015 0125   PLT 277 07/18/2015 1017   MCV 85.0 07/25/2015 0125   MCV 85.9 07/18/2015 1017   MCH 29.8 07/25/2015 0125   MCH 29.8 07/18/2015  1017   MCHC 35.0 07/25/2015 0125   MCHC 34.7 07/18/2015 1017   RDW 13.7 07/25/2015 0125   RDW 14.3  07/18/2015 1017   LYMPHSABS 1.5 07/18/2015 1017   LYMPHSABS 1.3 07/17/2015 1225   MONOABS 0.4 07/18/2015 1017   MONOABS 0.4 07/17/2015 1225   EOSABS 0.1 07/18/2015 1017   EOSABS 0.1 07/17/2015 1225   BASOSABS 0.0 07/18/2015 1017   BASOSABS 0.0 07/17/2015 1225     Assessment/Plan: 1. Hyponatremia- has history consistent with hypovolemia however also has small cell lung cancer and h/o SIADH. To further complicate picture, she has trace pedal edema. Agree with IVF's but need to repeat sodium levels STAT as they have been running for several hours and labs were ordered but not drawn. Tolvaptan was held until repeat labs performed and she did not receive a dose with improvement of serum sodium after IVF's alone.  1. Improved with IV normal saline and continues to improve without tolvaptan (although Uosm is inappropriately high as well as UNa but was on IV saline at the time and lower than her previous urine sodium levels). Likely component of volume depletion at this presentation. 2. Will stop IVF's and followup Sodium levels at cancer center Friday per primary svc. 3. 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 4. Currently asymptomatic 2. Small cell lung cancer- stage 3 for chemoradiation later this week per Dr. Julien Nordmann.  3. Hypokalemia- replete and follow. 4. hypomagnesemia- required large doses of replacement magnesium.  Unclear etiology but most likely related to protonix.  Agree with replacement therapy and continue to follow levels as an outpt at the [facility] 5. DM- per primary svc 6. COPD- stable 7. Bipolar disorder- continue with meds 8. Obesity 9. HTN- stable 10. No need for followup after discharge unless hyponatremia persists/worsens after discharge (hopefully will improve once chemoradiation therapy has begun)  Taesean Reth A

## 2015-07-25 NOTE — Progress Notes (Signed)
Patient will DC to: [facility] Anticipated DC date: 07/25/15 Family notified: Sister Transport by: Sister, by car  CSW signing off.  Cedric Fishman, [place] Social Worker [phone]

## 2015-07-25 NOTE — Progress Notes (Signed)
Sarah Leonard to be D/C'd Skilled nursing facility per MD order.  Discussed with the patient and all questions fully answered.  VSS, Skin clean, dry and intact without evidence of skin break down, no evidence of skin tears noted. IV catheter discontinued intact. Site without signs and symptoms of complications. Dressing and pressure applied.  An After Visit Summary was printed and given to the patient. Patient received prescription.  D/c education completed with patient/family including follow up instructions, medication list, d/c activities limitations if indicated, with other d/c instructions as indicated by MD - patient able to verbalize understanding, all questions fully answered.   Patient instructed to return to ED, call 911, or call MD for any changes in condition.   Patient escorted via [place], and D/C home via private auto.  Malcolm Metro 07/25/2015 1:50 PM

## 2015-07-25 NOTE — Progress Notes (Signed)
Dr Allyson Sabal said it was ok to d/c bed and chair alarm.  Patient instructed to still call for help because of her fall on  The 12/13

## 2015-07-26 ENCOUNTER — Other Ambulatory Visit: Payer: Self-pay | Admitting: Internal Medicine

## 2015-07-26 ENCOUNTER — Telehealth: Payer: Self-pay | Admitting: Internal Medicine

## 2015-07-26 ENCOUNTER — Ambulatory Visit: Payer: Self-pay

## 2015-07-26 NOTE — Telephone Encounter (Signed)
returned call and s.w. pt and confirmed appt....Marland Kitchenok and aware

## 2015-07-27 ENCOUNTER — Telehealth: Payer: Self-pay | Admitting: *Deleted

## 2015-07-27 ENCOUNTER — Ambulatory Visit: Payer: Self-pay

## 2015-07-27 ENCOUNTER — Ambulatory Visit
Admit: 2015-07-27 | Discharge: 2015-07-27 | Disposition: A | Discharge status: Home / Self Care | Payer: Medicaid Other | Attending: Radiation Oncology | Admitting: Radiation Oncology

## 2015-07-27 DIAGNOSIS — F329 Major depressive disorder, single episode, unspecified: Secondary | ICD-10-CM | POA: Diagnosis not present

## 2015-07-27 DIAGNOSIS — C3431 Malignant neoplasm of lower lobe, right bronchus or lung: Secondary | ICD-10-CM | POA: Diagnosis present

## 2015-07-27 DIAGNOSIS — E119 Type 2 diabetes mellitus without complications: Secondary | ICD-10-CM | POA: Diagnosis not present

## 2015-07-27 DIAGNOSIS — I959 Hypotension, unspecified: Secondary | ICD-10-CM | POA: Diagnosis not present

## 2015-07-27 DIAGNOSIS — F1420 Cocaine dependence, uncomplicated: Secondary | ICD-10-CM | POA: Diagnosis not present

## 2015-07-27 DIAGNOSIS — J449 Chronic obstructive pulmonary disease, unspecified: Secondary | ICD-10-CM | POA: Diagnosis not present

## 2015-07-27 DIAGNOSIS — E875 Hyperkalemia: Secondary | ICD-10-CM | POA: Diagnosis not present

## 2015-07-27 DIAGNOSIS — Z510 Encounter for antineoplastic radiation therapy: Secondary | ICD-10-CM | POA: Diagnosis present

## 2015-07-27 DIAGNOSIS — F17210 Nicotine dependence, cigarettes, uncomplicated: Secondary | ICD-10-CM | POA: Diagnosis not present

## 2015-07-27 DIAGNOSIS — Z833 Family history of diabetes mellitus: Secondary | ICD-10-CM | POA: Diagnosis not present

## 2015-07-27 DIAGNOSIS — G8929 Other chronic pain: Secondary | ICD-10-CM | POA: Diagnosis not present

## 2015-07-27 DIAGNOSIS — M545 Low back pain: Secondary | ICD-10-CM | POA: Diagnosis not present

## 2015-07-27 DIAGNOSIS — N289 Disorder of kidney and ureter, unspecified: Secondary | ICD-10-CM | POA: Diagnosis not present

## 2015-07-27 DIAGNOSIS — Z809 Family history of malignant neoplasm, unspecified: Secondary | ICD-10-CM | POA: Diagnosis not present

## 2015-07-27 NOTE — Telephone Encounter (Signed)
Per staff message and POF I have scheduled appts. Advised scheduler of appts. JMW  

## 2015-07-31 ENCOUNTER — Ambulatory Visit (HOSPITAL_BASED_OUTPATIENT_CLINIC_OR_DEPARTMENT_OTHER): Payer: Medicaid Other

## 2015-07-31 ENCOUNTER — Encounter: Payer: Self-pay | Admitting: *Deleted

## 2015-07-31 ENCOUNTER — Other Ambulatory Visit (HOSPITAL_BASED_OUTPATIENT_CLINIC_OR_DEPARTMENT_OTHER): Payer: Medicaid Other

## 2015-07-31 ENCOUNTER — Ambulatory Visit (HOSPITAL_BASED_OUTPATIENT_CLINIC_OR_DEPARTMENT_OTHER): Payer: Medicaid Other | Admitting: Physician Assistant

## 2015-07-31 ENCOUNTER — Ambulatory Visit: Payer: Self-pay

## 2015-07-31 VITALS — BP 161/99 | HR 101 | Temp 97.8°F | Resp 18 | Ht 63.0 in | Wt 214.0 lb

## 2015-07-31 VITALS — BP 167/89 | HR 95

## 2015-07-31 DIAGNOSIS — K5900 Constipation, unspecified: Secondary | ICD-10-CM | POA: Diagnosis not present

## 2015-07-31 DIAGNOSIS — C3431 Malignant neoplasm of lower lobe, right bronchus or lung: Secondary | ICD-10-CM

## 2015-07-31 DIAGNOSIS — E222 Syndrome of inappropriate secretion of antidiuretic hormone: Secondary | ICD-10-CM

## 2015-07-31 DIAGNOSIS — C3491 Malignant neoplasm of unspecified part of right bronchus or lung: Secondary | ICD-10-CM

## 2015-07-31 DIAGNOSIS — Z5111 Encounter for antineoplastic chemotherapy: Secondary | ICD-10-CM | POA: Diagnosis not present

## 2015-07-31 LAB — CBC WITH DIFFERENTIAL/PLATELET
BASO%: 0.3 % (ref 0.0–2.0)
Basophils Absolute: 0 10*3/uL (ref 0.0–0.1)
EOS%: 0 % (ref 0.0–7.0)
Eosinophils Absolute: 0 10*3/uL (ref 0.0–0.5)
HEMATOCRIT: 41.6 % (ref 34.8–46.6)
HGB: 13.8 g/dL (ref 11.6–15.9)
LYMPH#: 1.6 10*3/uL (ref 0.9–3.3)
LYMPH%: 11.8 % — ABNORMAL LOW (ref 14.0–49.7)
MCH: 29.1 pg (ref 25.1–34.0)
MCHC: 33 g/dL (ref 31.5–36.0)
MCV: 88 fL (ref 79.5–101.0)
MONO#: 0.9 10*3/uL (ref 0.1–0.9)
MONO%: 6.3 % (ref 0.0–14.0)
NEUT%: 81.6 % — ABNORMAL HIGH (ref 38.4–76.8)
NEUTROS ABS: 11.1 10*3/uL — AB (ref 1.5–6.5)
PLATELETS: 300 10*3/uL (ref 145–400)
RBC: 4.73 10*6/uL (ref 3.70–5.45)
RDW: 15.2 % — ABNORMAL HIGH (ref 11.2–14.5)
WBC: 13.6 10*3/uL — AB (ref 3.9–10.3)

## 2015-07-31 LAB — COMPREHENSIVE METABOLIC PANEL
ALT: 38 U/L (ref 0–55)
ANION GAP: 12 meq/L — AB (ref 3–11)
AST: 43 U/L — AB (ref 5–34)
Albumin: 2.7 g/dL — ABNORMAL LOW (ref 3.5–5.0)
Alkaline Phosphatase: 281 U/L — ABNORMAL HIGH (ref 40–150)
BILIRUBIN TOTAL: 0.44 mg/dL (ref 0.20–1.20)
BUN: 14.1 mg/dL (ref 7.0–26.0)
CALCIUM: 9.5 mg/dL (ref 8.4–10.4)
CHLORIDE: 85 meq/L — AB (ref 98–109)
CO2: 28 meq/L (ref 22–29)
CREATININE: 0.8 mg/dL (ref 0.6–1.1)
EGFR: 87 mL/min/{1.73_m2} — ABNORMAL LOW (ref >90)
Glucose: 364 mg/dl — ABNORMAL HIGH (ref 70–140)
Potassium: 3.3 mEq/L — ABNORMAL LOW (ref 3.5–5.1)
Sodium: 125 mEq/L — ABNORMAL LOW (ref 136–145)
TOTAL PROTEIN: 6.3 g/dL — AB (ref 6.4–8.3)

## 2015-07-31 MED ORDER — CLONIDINE HCL 0.1 MG PO TABS
ORAL_TABLET | ORAL | Status: AC
  Filled 2015-07-31: qty 2

## 2015-07-31 MED ORDER — HEPARIN SOD (PORK) LOCK FLUSH 100 UNIT/ML IV SOLN
500.0000 [IU] | Freq: Once | INTRAVENOUS | Status: AC | PRN
  Administered 2015-07-31: 500 [IU]
  Filled 2015-07-31: qty 5

## 2015-07-31 MED ORDER — SODIUM CHLORIDE 0.9 % IV SOLN
750.0000 mg | Freq: Once | INTRAVENOUS | Status: AC
  Administered 2015-07-31: 750 mg via INTRAVENOUS
  Filled 2015-07-31: qty 75

## 2015-07-31 MED ORDER — SODIUM CHLORIDE 0.9 % IV SOLN
Freq: Once | INTRAVENOUS | Status: AC
  Administered 2015-07-31: 11:00:00 via INTRAVENOUS

## 2015-07-31 MED ORDER — SODIUM CHLORIDE 0.9 % IV SOLN
Freq: Once | INTRAVENOUS | Status: AC
  Administered 2015-07-31: 12:00:00 via INTRAVENOUS
  Filled 2015-07-31: qty 8

## 2015-07-31 MED ORDER — CLONIDINE HCL 0.1 MG PO TABS
0.2000 mg | ORAL_TABLET | Freq: Once | ORAL | Status: AC
  Administered 2015-07-31: 0.2 mg via ORAL

## 2015-07-31 MED ORDER — SODIUM CHLORIDE 0.9 % IV SOLN
120.0000 mg/m2 | Freq: Once | INTRAVENOUS | Status: AC
  Administered 2015-07-31: 260 mg via INTRAVENOUS
  Filled 2015-07-31: qty 13

## 2015-07-31 MED ORDER — SODIUM CHLORIDE 0.9 % IJ SOLN
10.0000 mL | INTRAMUSCULAR | Status: DC | PRN
  Administered 2015-07-31: 10 mL
  Filled 2015-07-31: qty 10

## 2015-07-31 NOTE — Progress Notes (Signed)
Patient requested to be left accessed for chemotherapy tomorrow.  Reviewed with patient and sister to make sure area stays dry - do not take a shower with PAC needle accessed.

## 2015-07-31 NOTE — Progress Notes (Signed)
Reviewed labs and VS with Dr. Julien Nordmann. Okay to proceed with treatment, pt to receive 0.'2mg'$  of clonidine PO once per Dr. Julien Nordmann.

## 2015-07-31 NOTE — Patient Instructions (Signed)
[facility] Discharge Instructions for Patients Receiving Chemotherapy  Today you received the following chemotherapy agents: Carboplatin and Etoposide  To help prevent nausea and vomiting after your treatment, we encourage you to take your nausea medication as directed.    If you develop nausea and vomiting that is not controlled by your nausea medication, call the clinic.   BELOW ARE SYMPTOMS THAT SHOULD BE REPORTED IMMEDIATELY:  *FEVER GREATER THAN 100.5 F  *CHILLS WITH OR WITHOUT FEVER  NAUSEA AND VOMITING THAT IS NOT CONTROLLED WITH YOUR NAUSEA MEDICATION  *UNUSUAL SHORTNESS OF BREATH  *UNUSUAL BRUISING OR BLEEDING  TENDERNESS IN MOUTH AND THROAT WITH OR WITHOUT PRESENCE OF ULCERS  *URINARY PROBLEMS  *BOWEL PROBLEMS  UNUSUAL RASH Items with * indicate a potential emergency and should be followed up as soon as possible.  Feel free to call the clinic you have any questions or concerns. The clinic phone number is [phone].  Please show the [facility] at check-in to the Emergency Department and triage nurse.    Carboplatin injection What is this medicine? CARBOPLATIN (KAR boe pla tin) is a chemotherapy drug. It targets fast dividing cells, like cancer cells, and causes these cells to die. This medicine is used to treat ovarian cancer and many other cancers. This medicine may be used for other purposes; ask your health care provider or pharmacist if you have questions. What should I tell my health care provider before I take this medicine? They need to know if you have any of these conditions: -blood disorders -hearing problems -kidney disease -recent or ongoing radiation therapy -an unusual or allergic reaction to carboplatin, cisplatin, other chemotherapy, other medicines, foods, dyes, or preservatives -pregnant or trying to get pregnant -breast-feeding How should I use this medicine? This drug is usually given as an infusion into a vein.  It is administered in a hospital or clinic by a specially trained health care professional. Talk to your pediatrician regarding the use of this medicine in children. Special care may be needed. Overdosage: If you think you have taken too much of this medicine contact a poison control center or emergency room at once. NOTE: This medicine is only for you. Do not share this medicine with others. What if I miss a dose? It is important not to miss a dose. Call your doctor or health care professional if you are unable to keep an appointment. What may interact with this medicine? -medicines for seizures -medicines to increase blood counts like filgrastim, pegfilgrastim, sargramostim -some antibiotics like amikacin, gentamicin, neomycin, streptomycin, tobramycin -vaccines Talk to your doctor or health care professional before taking any of these medicines: -acetaminophen -aspirin -ibuprofen -ketoprofen -naproxen This list may not describe all possible interactions. Give your health care provider a list of all the medicines, herbs, non-prescription drugs, or dietary supplements you use. Also tell them if you smoke, drink alcohol, or use illegal drugs. Some items may interact with your medicine. What should I watch for while using this medicine? Your condition will be monitored carefully while you are receiving this medicine. You will need important blood work done while you are taking this medicine. This drug may make you feel generally unwell. This is not uncommon, as chemotherapy can affect healthy cells as well as cancer cells. Report any side effects. Continue your course of treatment even though you feel ill unless your doctor tells you to stop. In some cases, you may be given additional medicines to help with side effects. Follow all  directions for their use. Call your doctor or health care professional for advice if you get a fever, chills or sore throat, or other symptoms of a cold or flu. Do  not treat yourself. This drug decreases your body's ability to fight infections. Try to avoid being around people who are sick. This medicine may increase your risk to bruise or bleed. Call your doctor or health care professional if you notice any unusual bleeding. Be careful brushing and flossing your teeth or using a toothpick because you may get an infection or bleed more easily. If you have any dental work done, tell your dentist you are receiving this medicine. Avoid taking products that contain aspirin, acetaminophen, ibuprofen, naproxen, or ketoprofen unless instructed by your doctor. These medicines may hide a fever. Do not become pregnant while taking this medicine. Women should inform their doctor if they wish to become pregnant or think they might be pregnant. There is a potential for serious side effects to an unborn child. Talk to your health care professional or pharmacist for more information. Do not breast-feed an infant while taking this medicine. What side effects may I notice from receiving this medicine? Side effects that you should report to your doctor or health care professional as soon as possible: -allergic reactions like skin rash, itching or hives, swelling of the face, lips, or tongue -signs of infection - fever or chills, cough, sore throat, pain or difficulty passing urine -signs of decreased platelets or bleeding - bruising, pinpoint red spots on the skin, black, tarry stools, nosebleeds -signs of decreased red blood cells - unusually weak or tired, fainting spells, lightheadedness -breathing problems -changes in hearing -changes in vision -chest pain -high blood pressure -low blood counts - This drug may decrease the number of white blood cells, red blood cells and platelets. You may be at increased risk for infections and bleeding. -nausea and vomiting -pain, swelling, redness or irritation at the injection site -pain, tingling, numbness in the hands or  feet -problems with balance, talking, walking -trouble passing urine or change in the amount of urine Side effects that usually do not require medical attention (report to your doctor or health care professional if they continue or are bothersome): -hair loss -loss of appetite -metallic taste in the mouth or changes in taste This list may not describe all possible side effects. Call your doctor for medical advice about side effects. You may report side effects to FDA at 1-800-FDA-1088. Where should I keep my medicine? This drug is given in a hospital or clinic and will not be stored at home. NOTE: This sheet is a summary. It may not cover all possible information. If you have questions about this medicine, talk to your doctor, pharmacist, or health care provider.    2016, [employer]. (2007-11-02 14:38:05)    Etoposide, VP-16 injection What is this medicine? ETOPOSIDE, VP-16 (e toe POE side) is a chemotherapy drug. It is used to treat testicular cancer, lung cancer, and other cancers. This medicine may be used for other purposes; ask your health care provider or pharmacist if you have questions. What should I tell my health care provider before I take this medicine? They need to know if you have any of these conditions: -infection -kidney disease -low blood counts, like low white cell, platelet, or red cell counts -an unusual or allergic reaction to etoposide, other chemotherapeutic agents, other medicines, foods, dyes, or preservatives -pregnant or trying to get pregnant -breast-feeding How should I use this medicine?  This medicine is for infusion into a vein. It is administered in a hospital or clinic by a specially trained health care professional. Talk to your pediatrician regarding the use of this medicine in children. Special care may be needed. Overdosage: If you think you have taken too much of this medicine contact a poison control center or emergency room at  once. NOTE: This medicine is only for you. Do not share this medicine with others. What if I miss a dose? It is important not to miss your dose. Call your doctor or health care professional if you are unable to keep an appointment. What may interact with this medicine? -aspirin -certain medications for seizures like carbamazepine, phenobarbital, phenytoin, valproic acid -cyclosporine -levamisole -warfarin This list may not describe all possible interactions. Give your health care provider a list of all the medicines, herbs, non-prescription drugs, or dietary supplements you use. Also tell them if you smoke, drink alcohol, or use illegal drugs. Some items may interact with your medicine. What should I watch for while using this medicine? Visit your doctor for checks on your progress. This drug may make you feel generally unwell. This is not uncommon, as chemotherapy can affect healthy cells as well as cancer cells. Report any side effects. Continue your course of treatment even though you feel ill unless your doctor tells you to stop. In some cases, you may be given additional medicines to help with side effects. Follow all directions for their use. Call your doctor or health care professional for advice if you get a fever, chills or sore throat, or other symptoms of a cold or flu. Do not treat yourself. This drug decreases your body's ability to fight infections. Try to avoid being around people who are sick. This medicine may increase your risk to bruise or bleed. Call your doctor or health care professional if you notice any unusual bleeding. Be careful brushing and flossing your teeth or using a toothpick because you may get an infection or bleed more easily. If you have any dental work done, tell your dentist you are receiving this medicine. Avoid taking products that contain aspirin, acetaminophen, ibuprofen, naproxen, or ketoprofen unless instructed by your doctor. These medicines may hide a  fever. Do not become pregnant while taking this medicine or for at least 6 months after stopping it. Women should inform their doctor if they wish to become pregnant or think they might be pregnant. Women of child-bearing potential will need to have a negative pregnancy test before starting this medicine. There is a potential for serious side effects to an unborn child. Talk to your health care professional or pharmacist for more information. Do not breast-feed an infant while taking this medicine. Men must use a latex condom during sexual contact with a woman while taking this medicine and for at least 4 months after stopping it. A latex condom is needed even if you have had a vasectomy. Contact your doctor right away if your partner becomes pregnant. Do not donate sperm while taking this medicine and for at least 4 months after you stop taking this medicine. Men should inform their doctors if they wish to father a child. This medicine may lower sperm counts. What side effects may I notice from receiving this medicine? Side effects that you should report to your doctor or health care professional as soon as possible: -allergic reactions like skin rash, itching or hives, swelling of the face, lips, or tongue -low blood counts - this medicine may decrease  the number of white blood cells, red blood cells and platelets. You may be at increased risk for infections and bleeding. -signs of infection - fever or chills, cough, sore throat, pain or difficulty passing urine -signs of decreased platelets or bleeding - bruising, pinpoint red spots on the skin, black, tarry stools, blood in the urine -signs of decreased red blood cells - unusually weak or tired, fainting spells, lightheadedness -breathing problems -changes in vision -mouth or throat sores or ulcers -pain, redness, swelling or irritation at the injection site -pain, tingling, numbness in the hands or feet -redness, blistering, peeling or loosening  of the skin, including inside the mouth -seizures -vomiting Side effects that usually do not require medical attention (report to your doctor or health care professional if they continue or are bothersome): -diarrhea -hair loss -loss of appetite -nausea -stomach pain This list may not describe all possible side effects. Call your doctor for medical advice about side effects. You may report side effects to FDA at 1-800-FDA-1088. Where should I keep my medicine? This drug is given in a hospital or clinic and will not be stored at home. NOTE: This sheet is a summary. It may not cover all possible information. If you have questions about this medicine, talk to your doctor, pharmacist, or health care provider.    2016, [employer]. (2014-03-23 12:32:50)

## 2015-07-31 NOTE — Progress Notes (Signed)
Hematology and Oncology Follow Up Visit  Sarah Leonard [id number] May 02, 1961 54 y.o. 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  PET scan on 12/1 showed Hypermetabolic 2.2 cm right lower lobe pulmonary nodule, in keeping with a primary bronchogenic carcinoma, which has grown [id number],  Hypermetabolic ipsilateral peribronchial, subcarinal and ipsilateral paratracheal lymphadenopathy and a  suggestion of a new 6 mm right lower lobe pulmonary nodule  suspect an ipsilateral primary tumor lobe pulmonarymetastasis.  No extrathoracic hypermetabolic metastatic disease. She is nowLimited Stage III A   Prior therapy: None   Current therapy: Concurrent chemoradiation with systemic chemotherapy in the form of carboplatin for AUC of 5 on day 1 and etoposide 120 MG/M2 on days 1, 2 and 3 with Neulasta support on day 4. The patient will be treated for a total of 4-6 cycles of this regimen. D1C1 on 07/31/15  Interim History:  Patient returns for follow up prior to her treatment planned for next week. She was to start radiation and chemo on 12/14, but had to be evaluated at the ED due to nausea and vomiting felt to be due to hyponatremia with a sodium of 120, controlled with IV normal saline and antiemetics, and discharged on the same day. She feels tired, and has intermittent headaches. Of note, her MRI brain was negative for metastatic disease.  Denies fevers, chills, night sweats, vision changes, or mucositis. Denies any respiratory complaints. Denies any chest pain or palpitations. Denies lower extremity swelling. She reports intermittent nausea, denies heartburn. She is constipated, last bowel movement 4 days ago. Appetite is normal.  Denies any dysuria. Denies abnormal skin rashes, or neuropathy. Denies any bleeding issues such as epistaxis, hematemesis, hematuria or hematochezia. She continues to smoke.   Medications:  Reviewed and updated.   Allergies: No Known Allergies  Past Medical History, Surgical history, Social history, and Family History were reviewed and updated.  Review of Systems: ROS See HPI for significant positives Remaining ROS negative.  Physical Exam: Blood pressure 161/99, pulse 101, temperature 97.8 F (36.6 C), temperature source Oral, resp. rate 18, height '5\' 3"'$  (1.6 m), weight 214 lb (97.07 kg), SpO2 96 %.  Physical Exam  Constitutional: She is oriented to person, place, and time and well-developed, well-nourished, and in no distress.  HENT:  Head: Normocephalic and atraumatic.  Mouth/Throat: Oropharynx is clear and moist. No oropharyngeal exudate.  Eyes: EOM are normal. Pupils are equal, round, and reactive to light. Right eye exhibits no discharge. No scleral icterus.  Neck: Normal range of motion. Neck supple.  Cardiovascular: Normal rate, regular rhythm and normal heart sounds.  Exam reveals no gallop and no friction rub.   No murmur heard. Pulmonary/Chest: Effort normal and breath sounds normal.  Abdominal: Soft. Bowel sounds are normal.  Musculoskeletal: Normal range of motion.  Lymphadenopathy:    She has no cervical adenopathy.  Neurological: She is alert and oriented to person, place, and time.  Skin: Skin is dry. No rash noted. No erythema. No pallor.  Psychiatric: Memory, affect and judgment normal.  Mood depressed    LABS: CBC Latest Ref Rng 07/31/2015 07/25/2015 07/23/2015  WBC 3.9 - 10.3 10e3/uL 13.6(H) 13.9(H) 7.2  Hemoglobin 11.6 - 15.9 g/dL 13.8 12.3 12.5  Hematocrit 34.8 - 46.6 %  41.6 35.1(L) 33.8(L)  Platelets 145 - 400 10e3/uL 300 333 305   CMP Latest Ref Rng 07/31/2015 07/25/2015 07/25/2015  Glucose 70 - 140 mg/dl 364(H) 216(H) 196(H)  BUN 7.0 - 26.0  mg/dL 14.'1 8 7  '$ Creatinine 0.6 - 1.1 mg/dL 0.8 0.52 0.58  Sodium 136 - 145 mEq/L 125(L) 127(L) 126(L)  Potassium 3.5 - 5.1 mEq/L 3.3(L) 5.1 4.7  Chloride 101 - 111 mmol/L - 94(L) 94(L)  CO2 22 - 29 mEq/L '28 25 24  '$ Calcium 8.4 - 10.4 mg/dL 9.5 8.2(L) 8.3(L)  Total Protein 6.4 - 8.3 g/dL 6.3(L) - -  Total Bilirubin 0.20 - 1.20 mg/dL 0.44 - -  Alkaline Phos 40 - 150 U/L 281(H) - -  AST 5 - 34 U/L 43(H) - -  ALT 0 - 55 U/L 38 - -    Radiological Studies: Ct Head Wo Contrast  07/24/2015  CLINICAL DATA:  Slipped and fell face forward, hitting left side of head. Headache. Initial encounter. EXAM: CT HEAD WITHOUT CONTRAST TECHNIQUE: Contiguous axial images were obtained from the base of the skull through the vertex without intravenous contrast. COMPARISON:  CT of the head performed 07/22/2015 FINDINGS: There is no evidence of acute infarction, mass lesion, or intra- or extra-axial hemorrhage on CT. The posterior fossa, including the cerebellum, brainstem and fourth ventricle, is within normal limits. The third and lateral ventricles, and basal ganglia are unremarkable in appearance. The cerebral hemispheres are symmetric in appearance, with normal gray-white differentiation. No mass effect or midline shift is seen. There is no evidence of fracture; visualized osseous structures are unremarkable in appearance. The orbits are within normal limits. There is partial opacification of the left maxillary sinus. There is mild partial opacification of the left mastoid air cells. The remaining paranasal sinuses and right mastoid air cells are well-aerated. No significant soft tissue abnormalities are seen. IMPRESSION: 1. No evidence of traumatic intracranial injury or fracture. 2. Partial opacification of the left maxillary sinus and mild partial opacification of the left mastoid air cells. Electronically Signed   By: Garald Balding M.D.   On: 07/24/2015 04:58   Ct Head Wo Contrast  07/22/2015  CLINICAL DATA:   Small cell lung carcinoma.  Headache. EXAM: CT HEAD WITHOUT CONTRAST TECHNIQUE: Contiguous axial images were obtained from the base of the skull through the vertex without intravenous contrast. COMPARISON:  Head CT June 06, 2015; brain MRI June 26, 2015 FINDINGS: The ventricles are normal in size and configuration. There is no intracranial mass, hemorrhage, extra-axial fluid collection, or midline shift. There is mild small vessel disease in the centra semiovale bilaterally. There is no new gray-white compartment lesion. No acute infarct evident. The bony calvarium appears intact. There is patchy mastoid air cell disease on the left, stable. Mastoids on the right are clear. There is apparent cerumen in the right external auditory canal, a finding also present previously. There is a retention cyst in the posterior left maxillary antrum. There is ethmoid sinus mucosal thickening bilaterally. No intraorbital lesions are identified. IMPRESSION: Mild periventricular small vessel disease. No intracranial mass, hemorrhage, or extra-axial fluid collection. 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 Cerumen right external auditory canal. Areas of paranasal sinus disease as noted above. Electronically Signed   By: Lowella Grip III M.D.   On: 07/22/2015 08:05   Nm Pet Image Initial (pi) Skull Base To Thigh  07/12/2015  CLINICAL DATA:  Initial treatment strategy for newly diagnosed small-cell lung carcinoma based on EBUS FNA of a subcarinal  lymph node on 06/21/2015. EXAM: NUCLEAR MEDICINE PET SKULL BASE TO THIGH TECHNIQUE: 12.3 mCi F-18 FDG was injected intravenously. Full-ring PET imaging was performed from the skull base to thigh after the radiotracer. CT data was obtained and used for attenuation correction and anatomic localization. FASTING BLOOD GLUCOSE:  Value: 224 mg/dl COMPARISON:  06/19/2015 chest CT.  06/17/2015 CT abdomen/ pelvis. FINDINGS: NECK No hypermetabolic lymph nodes in the neck. Mucous retention  cyst versus polyp in the inferior posterior left maxillary sinus. CHEST There is a hypermetabolic 2.2 x 2.1 cm posterior right lower lobe pulmonary nodule (series 3/image 118) with max SUV 4.0, increased in size from 1.5 x 1.3 cm on 06/19/2015. There is a suggestion of a new 0.6 cm anterior right lower lobe pulmonary nodule (3/123), below PET resolution. No acute consolidative airspace disease. Mosaic attenuation throughout the lungs. There are multiple hypermetabolic ipsilateral right peribronchial nodes, largest 2.0 cm (series 3/image 90) with max SUV 10.2. There is bulky subcarinal hypermetabolic adenopathy measuring 3.6 cm (series 3/image 93) with max SUV 11.2. There is a mildly hypermetabolic and mildly enlarged right lower paratracheal 1.1 cm node (series 3/image 87) with max SUV 4.3. No contralateral mediastinal, contralateral hilar or supraclavicular hypermetabolic adenopathy. No hypermetabolic axillary nodes. Left anterior descending and right coronary atherosclerosis. Mitral annular calcifications. ABDOMEN/PELVIS No abnormal hypermetabolic activity within the liver, pancreas, adrenal glands, or spleen. No hypermetabolic lymph nodes in the abdomen or pelvis. Mild diffuse hepatic steatosis. Status post cholecystectomy. Two clustered 3 mm nonobstructing stones in the lower left kidney. Malrotated left kidney. Mild fullness of the left renal collecting system without overt hydronephrosis. SKELETON No focal hypermetabolic activity to suggest skeletal metastasis. IMPRESSION: 1. Hypermetabolic 2.2 cm right lower lobe pulmonary nodule, in keeping with a primary bronchogenic carcinoma, which has grown since 06/19/2015. 2. Hypermetabolic ipsilateral peribronchial, subcarinal and ipsilateral paratracheal lymphadenopathy. 3. Suggestion of a new 6 mm right lower lobe pulmonary nodule, below PET resolution, suspect an ipsilateral primary tumor lobe pulmonary metastasis. 4. No extrathoracic hypermetabolic metastatic  disease. 5. TNM Stage IIIa by PET-CT (T3 due to suspected ipsilateral primary tumor lobe metastasis N2 M0). 6. Additional findings including 2 vessel coronary atherosclerosis, mild diffuse hepatic steatosis and nonobstructing tiny left renal stones. Electronically Signed   By: Ilona Sorrel M.D.   On: 07/12/2015 10:56   Ir Fluoro Guide Cv Line Right  07/18/2015  CLINICAL DATA:  Lung carcinoma, needs venous access for chemotherapy EXAM: TUNNELED PORT CATHETER PLACEMENT WITH ULTRASOUND AND FLUOROSCOPIC GUIDANCE FLUOROSCOPY TIME:  0.4 minutes, 286  uGym2 DAP ANESTHESIA/SEDATION: Intravenous Fentanyl and Versed were administered as conscious sedation during continuous cardiorespiratory monitoring by the radiology RN, with a total moderate sedation time of 17 minutes. TECHNIQUE: The procedure, risks, benefits, and alternatives were explained to the patient. Questions regarding the procedure were encouraged and answered. The patient understands and consents to the procedure. As antibiotic prophylaxis, cefazolin 2 g was ordered pre-procedure and administered intravenously within one hour of incision. Patency of the right IJ vein was confirmed with ultrasound with image documentation. An appropriate skin site was determined. Skin site was marked. Region was prepped using maximum barrier technique including cap and mask, sterile gown, sterile gloves, large sterile sheet, and Chlorhexidine as cutaneous antisepsis. The region was infiltrated locally with 1% lidocaine.Under real-time ultrasound guidance, the right IJ vein was accessed with a 21 gauge micropuncture needle; the needle tip within the vein was confirmed with ultrasound image documentation. Needle was exchanged over a 018 guidewire for transitional dilator  which allowed passage of the [facility] wire into the IVC. Over this, the transitional dilator was exchanged for a 5 [place] MPA catheter. A small incision was made on the right anterior chest wall and a subcutaneous  pocket fashioned. The power-injectable port was positioned and its catheter tunneled to the right IJ dermatotomy site. The MPA catheter was exchanged over an Amplatz wire for a peel-away sheath, through which the port catheter, which had been trimmed to the appropriate length, was advanced and positioned under fluoroscopy with its tip at the cavoatrial junction. Spot chest radiograph confirms good catheter position and no pneumothorax. The pocket was closed with deep interrupted and subcuticular continuous 3-0 Monocryl sutures. The port was flushed per protocol. The incisions were covered with Dermabond then covered with a sterile dressing. COMPLICATIONS: COMPLICATIONS None immediate IMPRESSION: Technically successful right IJ power-injectable port catheter placement. Ready for routine use. Electronically Signed   By: Lucrezia Europe M.D.   On: 07/18/2015 09:29   Ir US Guide Vasc Access Right  07/18/2015  CLINICAL DATA:  Lung carcinoma, needs venous access for chemotherapy EXAM: TUNNELED PORT CATHETER PLACEMENT WITH ULTRASOUND AND FLUOROSCOPIC GUIDANCE FLUOROSCOPY TIME:  0.4 minutes, 286  uGym2 DAP ANESTHESIA/SEDATION: Intravenous Fentanyl and Versed were administered as conscious sedation during continuous cardiorespiratory monitoring by the radiology RN, with a total moderate sedation time of 17 minutes. TECHNIQUE: The procedure, risks, benefits, and alternatives were explained to the patient. Questions regarding the procedure were encouraged and answered. The patient understands and consents to the procedure. As antibiotic prophylaxis, cefazolin 2 g was ordered pre-procedure and administered intravenously within one hour of incision. Patency of the right IJ vein was confirmed with ultrasound with image documentation. An appropriate skin site was determined. Skin site was marked. Region was prepped using maximum barrier technique including cap and mask, sterile gown, sterile gloves, large sterile sheet, and  Chlorhexidine as cutaneous antisepsis. The region was infiltrated locally with 1% lidocaine.Under real-time ultrasound guidance, the right IJ vein was accessed with a 21 gauge micropuncture needle; the needle tip within the vein was confirmed with ultrasound image documentation. Needle was exchanged over a 018 guidewire for transitional dilator which allowed passage of the [facility] wire into the IVC. Over this, the transitional dilator was exchanged for a 5 [place] MPA catheter. A small incision was made on the right anterior chest wall and a subcutaneous pocket fashioned. The power-injectable port was positioned and its catheter tunneled to the right IJ dermatotomy site. The MPA catheter was exchanged over an Amplatz wire for a peel-away sheath, through which the port catheter, which had been trimmed to the appropriate length, was advanced and positioned under fluoroscopy with its tip at the cavoatrial junction. Spot chest radiograph confirms good catheter position and no pneumothorax. The pocket was closed with deep interrupted and subcuticular continuous 3-0 Monocryl sutures. The port was flushed per protocol. The incisions were covered with Dermabond then covered with a sterile dressing. COMPLICATIONS: COMPLICATIONS None immediate IMPRESSION: Technically successful right IJ power-injectable port catheter placement. Ready for routine use. Electronically Signed   By: Lucrezia Europe M.D.   On: 07/18/2015 09:29    Impression and Plan: This is a very pleasant 54 years old white female recently diagnosed initially limited stage (T1a, N2, M0) small cell lung cancer presented with right lower lobe pulmonary nodule in addition to mediastinal lymphadenopathy, diagnosed in November 2016 pending further staging workup with a PET scan.  PET scan on 12/1 showed Hypermetabolic 2.2 cm right lower lobe pulmonary  nodule, in keeping with a primary bronchogenic carcinoma, which has grown [id number],  Hypermetabolic ipsilateral  peribronchial, subcarinal and ipsilateral paratracheal lymphadenopathy and a  suggestion of a new 6 mm right lower lobe pulmonary nodule  suspect an ipsilateral primary tumor lobe pulmonarymetastasis.  No extrathoracic hypermetabolic metastatic disease. She is now limited Stage III A Recommended for the patient a course of concurrent chemoradiation with systemic chemotherapy in the form of carboplatin for AUC of 5 on day 1 and etoposide 120 MG/M2 on days 1, 2 and 3 with Neulasta support on day 4. The patient will be treated for a total of 4-6 cycles of this regimen. She is expected to receive concurrent radiotherapy during the first 2 cycles of her treatment. She was to start on 12/14 but due to hospitalization for nausea due to hyponatremia, this was postponed to today, 12/20. Discussed with the patient adverse effect of the chemotherapy including but not limited to alopecia, myelosuppression, nausea and vomiting, peripheral neuropathy, liver or renal dysfunction. Made the decision not to cisplatin in this patient because of her history of diabetes mellitus in addition to concern about delayed nausea.  She received chemotherapy education class before the first cycle of her chemotherapy.  For the headache she was given  Percocet 5/325 mg by mouth every 8 hours as needed during initial visit. For nausea, she is to take Compazine 10 mg by mouth every 6 hours as needed for nausea. 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 She was advised to increase her salt intake during this time.  For her hypokalemia, she was prescribed K dur 20 meq x 7 days with some response  For her malnutrition, a referral to a Nutritionist was done today to help with her allimentary status For her constipation she was instructed to take over the counter milk of magnesium.  She was instructed to  quit smoking altogether and she agreed to do so starting today. The patient would come back for follow-up visit in 1 weeks for reevaluation and management of any adverse effect of her treatment. She was advised to call immediately if she has any concerning symptoms in the interval. The patient voices understanding of current disease status and treatment options and is in agreement with the current care plan.  All questions were answered. The patient knows to call the clinic with any problems, questions or concerns. We can certainly see the patient much sooner if necessary.   Rondel Jumbo, PA-C 12/20/201611:05 AM  ADDENDUM: Hematology/Oncology Attending: I had a face to face encounter with the patient. I recommended her care plan. This is a very pleasant 54 years old white female recently diagnosed with limited stage small cell lung cancer. The patient is here today to start the first cycle of systemic chemotherapy with carboplatin and etoposide. She is also expected to start a course of concurrent radiotherapy soon. She was recently admitted to [facility] with weakness and fatigue as well as hyponatremia. She is feeling much better today.   I recommended for the patient to proceed with the first cycle of her treatment today as scheduled. For pain management she will given prescription for Percocet 5/325 mg by mouth every 8 hours as needed. For constipation, the patient was advised to use milk of magnesia until she has a bowel movement then she continue with MiraLAX as needed. The patient would come back for  follow-up visit in one week for reevaluation and management of any adverse effect of her treatment. She was advised to call immediately if she has any concerning symptoms in the interval.  Disclaimer: This note was dictated with voice recognition software. Similar sounding words can inadvertently be transcribed and may be missed upon review. Eilleen Kempf., MD 07/31/2015

## 2015-07-31 NOTE — Progress Notes (Signed)
Oncology Nurse Navigator Documentation  Oncology Nurse Navigator Flowsheets 07/03/2015  Navigator Encounter Type Initial MedOnc/spoke with patient and family member today at [facility].  Patient is starting her first chemo today.  She is nervous but ready to start.  She stated she has been smoking.  I gave and explained information on smoking cessation.    Patient Visit Type Initial  Treatment Phase Abnormal Scans  Barriers/Navigation Needs Education;Transportation  Education Newly Diagnosed Cancer Education;Transport During Treatment;Understanding Cancer/ Treatment Options  Interventions Education Method  Education Method Verbal;Written  Time Spent with Patient 30

## 2015-08-01 ENCOUNTER — Ambulatory Visit (HOSPITAL_BASED_OUTPATIENT_CLINIC_OR_DEPARTMENT_OTHER): Payer: Medicaid Other

## 2015-08-01 ENCOUNTER — Other Ambulatory Visit: Payer: Self-pay | Admitting: Physician Assistant

## 2015-08-01 ENCOUNTER — Ambulatory Visit: Payer: Medicaid Other | Admitting: Radiation Oncology

## 2015-08-01 ENCOUNTER — Ambulatory Visit
Admission: RE | Admit: 2015-08-01 | Discharge: 2015-08-01 | Disposition: A | Discharge status: Home / Self Care | Payer: Medicaid Other | Source: Ambulatory Visit | Attending: Radiation Oncology | Admitting: Radiation Oncology

## 2015-08-01 ENCOUNTER — Ambulatory Visit: Payer: Self-pay

## 2015-08-01 ENCOUNTER — Telehealth: Payer: Self-pay | Admitting: *Deleted

## 2015-08-01 ENCOUNTER — Ambulatory Visit: Payer: Medicaid Other | Admitting: Nutrition

## 2015-08-01 ENCOUNTER — Other Ambulatory Visit: Payer: Self-pay | Admitting: Medical Oncology

## 2015-08-01 ENCOUNTER — Telehealth: Payer: Self-pay | Admitting: Internal Medicine

## 2015-08-01 VITALS — BP 153/93 | HR 96 | Temp 97.9°F | Resp 18

## 2015-08-01 DIAGNOSIS — C3431 Malignant neoplasm of lower lobe, right bronchus or lung: Secondary | ICD-10-CM | POA: Diagnosis not present

## 2015-08-01 DIAGNOSIS — Z5111 Encounter for antineoplastic chemotherapy: Secondary | ICD-10-CM

## 2015-08-01 MED ORDER — OXYCODONE-ACETAMINOPHEN 5-325 MG PO TABS: 1.0000 | ORAL_TABLET | Freq: Three times a day (TID) | ORAL | Status: DC | PRN

## 2015-08-01 MED ORDER — HEPARIN SOD (PORK) LOCK FLUSH 100 UNIT/ML IV SOLN
500.0000 [IU] | Freq: Once | INTRAVENOUS | Status: AC | PRN
  Administered 2015-08-01: 500 [IU]
  Filled 2015-08-01: qty 5

## 2015-08-01 MED ORDER — SODIUM CHLORIDE 0.9 % IV SOLN
Freq: Once | INTRAVENOUS | Status: AC
  Administered 2015-08-01: 13:00:00 via INTRAVENOUS

## 2015-08-01 MED ORDER — SODIUM CHLORIDE 0.9 % IV SOLN
120.0000 mg/m2 | Freq: Once | INTRAVENOUS | Status: AC
  Administered 2015-08-01: 260 mg via INTRAVENOUS
  Filled 2015-08-01: qty 13

## 2015-08-01 MED ORDER — SODIUM CHLORIDE 0.9 % IJ SOLN
10.0000 mL | INTRAMUSCULAR | Status: DC | PRN
  Administered 2015-08-01: 10 mL
  Filled 2015-08-01: qty 10

## 2015-08-01 MED ORDER — SODIUM CHLORIDE 0.9 % IV SOLN
Freq: Once | INTRAVENOUS | Status: AC
  Administered 2015-08-01: 13:00:00 via INTRAVENOUS
  Filled 2015-08-01: qty 4

## 2015-08-01 MED ORDER — PROCHLORPERAZINE MALEATE 10 MG PO TABS: 10.0000 mg | ORAL_TABLET | Freq: Four times a day (QID) | ORAL | Status: DC | PRN

## 2015-08-01 NOTE — Progress Notes (Signed)
54 year old female diagnosed with limited stage small cell lung cancer receiving concurrent chemoradiation therapy.  Past medical history includes diabetes, pancreatitis, bipolar disease, alcohol and cocaine use, COPD, renal disorder, and septic shock.  Medications include calcium, vitamin D, Colace, NovoLog, Lantus, Ativan.  Labs include glucose 364, Sodium 125, potassium 3.3 on December 20.  Height: 63 inches. Weight: 214 pounds December 20. Usual body weight: 242 pounds in June 26, 2015. BMI: 37.92.  Patient reports she has had nausea and constipation. She has started to drink some ensure and enjoys this. Patient has supportive family. Patient lives at an assisted living[facility] and does not care for the food there. Patient is positive for approximately 30 pound weight loss in the past 6 weeks. Did not complete nutrition physical focused exam.  Nutrition diagnosis:  Unintended weight loss related to new diagnosis of small cell lung cancer as evidenced by 12% weight loss over 6 weeks.  Intervention:  Patient was educated to consume small frequent meals and snacks consuming high-calorie high-protein foods. Educated patient on the importance of avoiding overly sweet and overly spicy or greasy foods during times of nausea. Educated patient on strategies for improving constipation encouraged increased hydration. Multiple fact sheets were provided. Recommended patient continue Ensure Plus or boost plus for additional calories and protein, especially when she is not eating well at meals. Provided coupons. Questions were answered.  Teach back method used.  Contact information was provided.  Monitoring evaluation,Goals: Patient will tolerate adequate calories and protein to promote weight maintenance  Next visit: Will try to follow-up on future chemotherapy visit.  **Disclaimer: This note was dictated with voice recognition software. Similar sounding words can inadvertently be  transcribed and this note may contain transcription errors which may not have been corrected upon publication of note.**

## 2015-08-01 NOTE — Patient Instructions (Signed)
[facility] Discharge Instructions for Patients Receiving Chemotherapy  Today you received the following chemotherapy agents etoposide   To help prevent nausea and vomiting after your treatment, we encourage you to take your nausea medication as directed  If you develop nausea and vomiting that is not controlled by your nausea medication, call the clinic.   BELOW ARE SYMPTOMS THAT SHOULD BE REPORTED IMMEDIATELY:  *FEVER GREATER THAN 100.5 F  *CHILLS WITH OR WITHOUT FEVER  NAUSEA AND VOMITING THAT IS NOT CONTROLLED WITH YOUR NAUSEA MEDICATION  *UNUSUAL SHORTNESS OF BREATH  *UNUSUAL BRUISING OR BLEEDING  TENDERNESS IN MOUTH AND THROAT WITH OR WITHOUT PRESENCE OF ULCERS  *URINARY PROBLEMS  *BOWEL PROBLEMS  UNUSUAL RASH Items with * indicate a potential emergency and should be followed up as soon as possible.  Feel free to call the clinic you have any questions or concerns. The clinic phone number is [phone].  

## 2015-08-01 NOTE — Telephone Encounter (Signed)
Patient's sister  Ocie Cornfield stating that some one from Dr. Gardiner Coins office called and cancelled the patient's treatment today was this true?, I took her number and went to see Miranda RT Therapist on [address], was to have a port and treat today at 52, Jeannetta Nap called the sister at [phone] and yes, patient is being transferred to  Another linac machine starting at a later date, the new machine will better for the patient treatment field 10:54 AM

## 2015-08-01 NOTE — Telephone Encounter (Signed)
Pt came in today wanting to know why her chemo isn't on her calendar. Scheduled appts for 1/10 - 1/14 and 1/31 - 2/4. Gave new AVS.

## 2015-08-02 ENCOUNTER — Ambulatory Visit (HOSPITAL_BASED_OUTPATIENT_CLINIC_OR_DEPARTMENT_OTHER): Payer: Medicaid Other

## 2015-08-02 ENCOUNTER — Ambulatory Visit: Payer: Self-pay

## 2015-08-02 ENCOUNTER — Ambulatory Visit: Payer: Medicaid Other

## 2015-08-02 DIAGNOSIS — C3431 Malignant neoplasm of lower lobe, right bronchus or lung: Secondary | ICD-10-CM

## 2015-08-02 DIAGNOSIS — Z5111 Encounter for antineoplastic chemotherapy: Secondary | ICD-10-CM | POA: Diagnosis not present

## 2015-08-02 MED ORDER — HEPARIN SOD (PORK) LOCK FLUSH 100 UNIT/ML IV SOLN
500.0000 [IU] | Freq: Once | INTRAVENOUS | Status: AC | PRN
  Administered 2015-08-02: 500 [IU]
  Filled 2015-08-02: qty 5

## 2015-08-02 MED ORDER — SODIUM CHLORIDE 0.9 % IV SOLN
Freq: Once | INTRAVENOUS | Status: AC
  Administered 2015-08-02: 09:00:00 via INTRAVENOUS

## 2015-08-02 MED ORDER — SODIUM CHLORIDE 0.9 % IV SOLN
120.0000 mg/m2 | Freq: Once | INTRAVENOUS | Status: AC
  Administered 2015-08-02: 260 mg via INTRAVENOUS
  Filled 2015-08-02: qty 13

## 2015-08-02 MED ORDER — SODIUM CHLORIDE 0.9 % IJ SOLN
10.0000 mL | INTRAMUSCULAR | Status: DC | PRN
  Administered 2015-08-02: 10 mL
  Filled 2015-08-02: qty 10

## 2015-08-02 MED ORDER — DEXAMETHASONE SODIUM PHOSPHATE 100 MG/10ML IJ SOLN
Freq: Once | INTRAMUSCULAR | Status: AC
  Administered 2015-08-02: 09:00:00 via INTRAVENOUS
  Filled 2015-08-02: qty 4

## 2015-08-02 NOTE — Patient Instructions (Signed)
[facility] Discharge Instructions for Patients Receiving Chemotherapy  Today you received the following chemotherapy agents: Etoposide   To help prevent nausea and vomiting after your treatment, we encourage you to take your nausea medication as directed.    If you develop nausea and vomiting that is not controlled by your nausea medication, call the clinic.   BELOW ARE SYMPTOMS THAT SHOULD BE REPORTED IMMEDIATELY:  *FEVER GREATER THAN 100.5 F  *CHILLS WITH OR WITHOUT FEVER  NAUSEA AND VOMITING THAT IS NOT CONTROLLED WITH YOUR NAUSEA MEDICATION  *UNUSUAL SHORTNESS OF BREATH  *UNUSUAL BRUISING OR BLEEDING  TENDERNESS IN MOUTH AND THROAT WITH OR WITHOUT PRESENCE OF ULCERS  *URINARY PROBLEMS  *BOWEL PROBLEMS  UNUSUAL RASH Items with * indicate a potential emergency and should be followed up as soon as possible.  Feel free to call the clinic you have any questions or concerns. The clinic phone number is [phone].  Please show the CHEMO ALERT CARD at check-in to the Emergency Department and triage nurse.   

## 2015-08-03 ENCOUNTER — Ambulatory Visit: Payer: Medicaid Other

## 2015-08-03 DIAGNOSIS — Z510 Encounter for antineoplastic radiation therapy: Secondary | ICD-10-CM | POA: Diagnosis not present

## 2015-08-04 ENCOUNTER — Ambulatory Visit: Payer: Self-pay

## 2015-08-04 ENCOUNTER — Ambulatory Visit (HOSPITAL_BASED_OUTPATIENT_CLINIC_OR_DEPARTMENT_OTHER): Payer: Medicaid Other

## 2015-08-04 DIAGNOSIS — C3431 Malignant neoplasm of lower lobe, right bronchus or lung: Secondary | ICD-10-CM | POA: Diagnosis not present

## 2015-08-04 DIAGNOSIS — Z5189 Encounter for other specified aftercare: Secondary | ICD-10-CM

## 2015-08-04 MED ORDER — PEGFILGRASTIM INJECTION 6 MG/0.6ML ~~LOC~~
6.0000 mg | PREFILLED_SYRINGE | Freq: Once | SUBCUTANEOUS | Status: AC
  Administered 2015-08-04: 6 mg via SUBCUTANEOUS

## 2015-08-04 NOTE — Patient Instructions (Signed)
Pegfilgrastim injection What is this medicine? PEGFILGRASTIM (PEG fil gra stim) is a long-acting granulocyte colony-stimulating factor that stimulates the growth of neutrophils, a type of white blood cell important in the body's fight against infection. It is used to reduce the incidence of fever and infection in patients with certain types of cancer who are receiving chemotherapy that affects the bone marrow, and to increase survival after being exposed to high doses of radiation. This medicine may be used for other purposes; ask your health care provider or pharmacist if you have questions. What should I tell my health care provider before I take this medicine? They need to know if you have any of these conditions: -kidney disease -latex allergy -ongoing radiation therapy -sickle cell disease -skin reactions to acrylic adhesives (On-Body Injector only) -an unusual or allergic reaction to pegfilgrastim, filgrastim, other medicines, foods, dyes, or preservatives -pregnant or trying to get pregnant -breast-feeding How should I use this medicine? This medicine is for injection under the skin. If you get this medicine at home, you will be taught how to prepare and give the pre-filled syringe or how to use the On-body Injector. Refer to the patient Instructions for Use for detailed instructions. Use exactly as directed. Take your medicine at regular intervals. Do not take your medicine more often than directed. It is important that you put your used needles and syringes in a special sharps container. Do not put them in a trash can. If you do not have a sharps container, call your pharmacist or healthcare provider to get one. Talk to your pediatrician regarding the use of this medicine in children. While this drug may be prescribed for selected conditions, precautions do apply. Overdosage: If you think you have taken too much of this medicine contact a poison control center or emergency room at  once. NOTE: This medicine is only for you. Do not share this medicine with others. What if I miss a dose? It is important not to miss your dose. Call your doctor or health care professional if you miss your dose. If you miss a dose due to an On-body Injector failure or leakage, a new dose should be administered as soon as possible using a single prefilled syringe for manual use. What may interact with this medicine? Interactions have not been studied. Give your health care provider a list of all the medicines, herbs, non-prescription drugs, or dietary supplements you use. Also tell them if you smoke, drink alcohol, or use illegal drugs. Some items may interact with your medicine. This list may not describe all possible interactions. Give your health care provider a list of all the medicines, herbs, non-prescription drugs, or dietary supplements you use. Also tell them if you smoke, drink alcohol, or use illegal drugs. Some items may interact with your medicine. What should I watch for while using this medicine? You may need blood work done while you are taking this medicine. If you are going to need a MRI, CT scan, or other procedure, tell your doctor that you are using this medicine (On-Body Injector only). What side effects may I notice from receiving this medicine? Side effects that you should report to your doctor or health care professional as soon as possible: -allergic reactions like skin rash, itching or hives, swelling of the face, lips, or tongue -dizziness -fever -pain, redness, or irritation at site where injected -pinpoint red spots on the skin -red or dark-brown urine -shortness of breath or breathing problems -stomach or side pain, or pain   at the shoulder -swelling -tiredness -trouble passing urine or change in the amount of urine Side effects that usually do not require medical attention (report to your doctor or health care professional if they continue or are  bothersome): -bone pain -muscle pain This list may not describe all possible side effects. Call your doctor for medical advice about side effects. You may report side effects to FDA at 1-800-FDA-1088. Where should I keep my medicine? Keep out of the reach of children. Store pre-filled syringes in a refrigerator between 2 and 8 degrees C (36 and 46 degrees F). Do not freeze. Keep in carton to protect from light. Throw away this medicine if it is left out of the refrigerator for more than 48 hours. Throw away any unused medicine after the expiration date. NOTE: This sheet is a summary. It may not cover all possible information. If you have questions about this medicine, talk to your doctor, pharmacist, or health care provider.    2016, [employer]. (2014-08-17 14:30:14)  

## 2015-08-07 ENCOUNTER — Encounter: Payer: Self-pay | Admitting: *Deleted

## 2015-08-07 ENCOUNTER — Ambulatory Visit: Payer: Medicaid Other

## 2015-08-07 NOTE — Progress Notes (Signed)
[facility] Psychosocial Distress Screening Clinical Social Work  Clinical Social Work was referred by distress screening protocol.  The patient scored a 10 on the Psychosocial Distress Thermometer which indicates severe distress. Clinical Social Worker reviewed chart and attempted to phone pt to assess for distress and other psychosocial needs. Pt appears to live in ALF, [facility]. CSW was not able to leave a message and will attempt to follow up at future appointments.   [facility] DISTRESS SCREENING 07/02/2015  Screening Type   Distress experienced in past week (1-10) 10  Emotional problem type Nervousness/Anxiety;Adjusting to illness  Information Concerns Type Lack of info about diagnosis;Lack of info about treatment;Lack of info about complementary therapy choices;Lack of info about maintaining fitness  Physical Problem type Pain;Nausea/vomiting;Sleep/insomnia;Getting around;Bathing/dressing;Loss of appetitie  Physician notified of physical symptoms Yes  Referral to clinical social work Yes    Clinical Social Worker follow up needed: Yes.    If yes, follow up plan: See above Loren Racer, [place] Worker [place]  [facility] Phone: [phone] Fax: [phone]

## 2015-08-08 ENCOUNTER — Other Ambulatory Visit (HOSPITAL_BASED_OUTPATIENT_CLINIC_OR_DEPARTMENT_OTHER): Payer: Medicaid Other

## 2015-08-08 ENCOUNTER — Encounter: Payer: Self-pay | Admitting: Physician Assistant

## 2015-08-08 ENCOUNTER — Other Ambulatory Visit: Payer: Self-pay | Admitting: *Deleted

## 2015-08-08 ENCOUNTER — Ambulatory Visit (HOSPITAL_BASED_OUTPATIENT_CLINIC_OR_DEPARTMENT_OTHER): Payer: Medicaid Other | Admitting: Physician Assistant

## 2015-08-08 ENCOUNTER — Ambulatory Visit: Payer: Medicaid Other

## 2015-08-08 ENCOUNTER — Ambulatory Visit
Admission: RE | Admit: 2015-08-08 | Discharge: 2015-08-08 | Disposition: A | Discharge status: Home / Self Care | Payer: Medicaid Other | Source: Ambulatory Visit | Attending: Radiation Oncology | Admitting: Radiation Oncology

## 2015-08-08 VITALS — BP 125/73 | HR 104 | Temp 98.4°F | Resp 18 | Ht 63.0 in | Wt 217.5 lb

## 2015-08-08 DIAGNOSIS — D61810 Antineoplastic chemotherapy induced pancytopenia: Secondary | ICD-10-CM | POA: Diagnosis not present

## 2015-08-08 DIAGNOSIS — C3431 Malignant neoplasm of lower lobe, right bronchus or lung: Secondary | ICD-10-CM

## 2015-08-08 DIAGNOSIS — E222 Syndrome of inappropriate secretion of antidiuretic hormone: Secondary | ICD-10-CM | POA: Diagnosis not present

## 2015-08-08 DIAGNOSIS — D701 Agranulocytosis secondary to cancer chemotherapy: Secondary | ICD-10-CM

## 2015-08-08 DIAGNOSIS — C3491 Malignant neoplasm of unspecified part of right bronchus or lung: Secondary | ICD-10-CM

## 2015-08-08 DIAGNOSIS — E871 Hypo-osmolality and hyponatremia: Secondary | ICD-10-CM | POA: Diagnosis not present

## 2015-08-08 DIAGNOSIS — Z510 Encounter for antineoplastic radiation therapy: Secondary | ICD-10-CM | POA: Diagnosis not present

## 2015-08-08 DIAGNOSIS — T451X5A Adverse effect of antineoplastic and immunosuppressive drugs, initial encounter: Secondary | ICD-10-CM

## 2015-08-08 DIAGNOSIS — D709 Neutropenia, unspecified: Secondary | ICD-10-CM | POA: Diagnosis not present

## 2015-08-08 DIAGNOSIS — C3411 Malignant neoplasm of upper lobe, right bronchus or lung: Secondary | ICD-10-CM

## 2015-08-08 HISTORY — DX: Adverse effect of antineoplastic and immunosuppressive drugs, initial encounter: D70.1

## 2015-08-08 HISTORY — DX: Adverse effect of antineoplastic and immunosuppressive drugs, initial encounter: T45.1X5A

## 2015-08-08 LAB — CBC WITH DIFFERENTIAL/PLATELET
BASO%: 0.5 % (ref 0.0–2.0)
Basophils Absolute: 0 10*3/uL (ref 0.0–0.1)
EOS ABS: 0 10*3/uL (ref 0.0–0.5)
EOS%: 2.2 % (ref 0.0–7.0)
HEMATOCRIT: 31.8 % — AB (ref 34.8–46.6)
HGB: 10.6 g/dL — ABNORMAL LOW (ref 11.6–15.9)
LYMPH#: 0.7 10*3/uL — AB (ref 0.9–3.3)
LYMPH%: 85.4 % — AB (ref 14.0–49.7)
MCH: 29.6 pg (ref 25.1–34.0)
MCHC: 33.4 g/dL (ref 31.5–36.0)
MCV: 88.6 fL (ref 79.5–101.0)
MONO#: 0 10*3/uL — AB (ref 0.1–0.9)
MONO%: 1.3 % (ref 0.0–14.0)
NEUT%: 10.6 % — AB (ref 38.4–76.8)
NEUTROS ABS: 0.1 10*3/uL — AB (ref 1.5–6.5)
PLATELETS: 74 10*3/uL — AB (ref 145–400)
RBC: 3.58 10*6/uL — ABNORMAL LOW (ref 3.70–5.45)
RDW: 15.2 % — ABNORMAL HIGH (ref 11.2–14.5)
WBC: 0.8 10*3/uL — AB (ref 3.9–10.3)

## 2015-08-08 LAB — COMPREHENSIVE METABOLIC PANEL
ALK PHOS: 196 U/L — AB (ref 40–150)
ALT: 22 U/L (ref 0–55)
ANION GAP: 9 meq/L (ref 3–11)
AST: 26 U/L (ref 5–34)
Albumin: 2.5 g/dL — ABNORMAL LOW (ref 3.5–5.0)
BILIRUBIN TOTAL: 0.52 mg/dL (ref 0.20–1.20)
BUN: 7.9 mg/dL (ref 7.0–26.0)
CALCIUM: 8.9 mg/dL (ref 8.4–10.4)
CO2: 28 mEq/L (ref 22–29)
CREATININE: 0.7 mg/dL (ref 0.6–1.1)
Chloride: 97 mEq/L — ABNORMAL LOW (ref 98–109)
Glucose: 325 mg/dl — ABNORMAL HIGH (ref 70–140)
Potassium: 3.6 mEq/L (ref 3.5–5.1)
Sodium: 133 mEq/L — ABNORMAL LOW (ref 136–145)
TOTAL PROTEIN: 5.7 g/dL — AB (ref 6.4–8.3)

## 2015-08-08 MED ORDER — CIPROFLOXACIN HCL 500 MG PO TABS: 500.0000 mg | ORAL_TABLET | Freq: Two times a day (BID) | ORAL | Status: DC

## 2015-08-08 NOTE — Progress Notes (Addendum)
Pt education, sonafine cream, my business card, radiation thrapy and you book  Given, discussed side efects, skin iritation, pain, fatigue, throat changes, nausea, increase protein in diet, drink plenty fluids, stay hydrated, verbal understanding, teach back given 2:02 PM

## 2015-08-08 NOTE — Progress Notes (Signed)
Hematology and Oncology Follow Up Visit  Sarah Leonard [id number] 1961/07/21 54 y.o. 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  PET scan on 12/1 showed Hypermetabolic 2.2 cm right lower lobe pulmonary nodule, in keeping with a primary bronchogenic carcinoma, which has grown [id number],  Hypermetabolic ipsilateral peribronchial, subcarinal and ipsilateral paratracheal lymphadenopathy and a  suggestion of a new 6 mm right lower lobe pulmonary nodule  suspect an ipsilateral primary tumor lobe pulmonarymetastasis.  No extrathoracic hypermetabolic metastatic disease. She is nowLimited Stage III A   Prior therapy: None   Current therapy: Concurrent chemoradiation with systemic chemotherapy in the form of carboplatin for AUC of 5 on day 1 and etoposide 120 MG/M2 on days 1, 2 and 3 with Neulasta support on day 4. The patient will be treated for a total of 4-6 cycles of this regimen. D1C1 on 07/31/15  Interim History:  Patient returns for follow up prior to her treatment planned for next week. She feels better than prior visit. Denies fevers, chills, night sweats, vision changes, or mucositis. Denies any respiratory complaints. Denies any chest pain or palpitations. Denies lower extremity swelling. She reports intermittent nausea, denies heartburn. She denies constipation. Appetite is improved, she is pushing Ensure and has gained 3 lbs since last visit. Denies any dysuria. Denies abnormal skin rashes, or neuropathy. Denies any bleeding issues such as epistaxis, hematemesis, hematuria or hematochezia. She discontinued her tobacco use.   Medications:  Reviewed and updated.   Allergies: No Known Allergies  Past Medical History,  Surgical history, Social history, and Family History were reviewed and updated.  Review of Systems: ROS See HPI for significant positives Remaining ROS negative.  Physical Exam: Filed Vitals:   08/08/15 0831  BP: 125/73  Pulse: 104  Temp: 98.4 F (36.9 C)  Resp: 18     Physical Exam  Constitutional: She is oriented to person, place, and time and well-developed, well-nourished, and in no distress.  HENT:  Head: Normocephalic and atraumatic.  Mouth/Throat: Oropharynx is clear and moist. No oropharyngeal exudate.  Eyes: EOM are normal. Pupils are equal, round, and reactive to light. Right eye exhibits no discharge. No scleral icterus.  Neck: Normal range of motion. Neck supple.  Cardiovascular: Normal rate, regular rhythm and normal heart sounds.  Exam reveals no gallop and no friction rub.   No murmur heard. Pulmonary/Chest: Effort normal and breath sounds normal.  Abdominal: Soft. Bowel sounds are normal.  Musculoskeletal: Normal range of motion.  Lymphadenopathy:    She has no cervical adenopathy.  Neurological: She is alert and oriented to person, place, and time.  Skin: Skin is dry. No rash noted. No erythema. No pallor.  Psychiatric: Memory, affect and judgment normal.    LABS: CBC Latest Ref Rng 08/08/2015 07/31/2015 07/25/2015  WBC 3.9 - 10.3 10e3/uL 0.8(LL) 13.6(H) 13.9(H)  Hemoglobin 11.6 - 15.9 g/dL 10.6(L) 13.8 12.3  Hematocrit 34.8 - 46.6 % 31.8(L) 41.6 35.1(L)  Platelets 145 - 400 10e3/uL 74(L) 300 333     CMP Latest Ref Rng 07/31/2015 07/25/2015 07/25/2015  Glucose 70 - 140 mg/dl 364(H) 216(H) 196(H)  BUN 7.0 - 26.0 mg/dL 14.'1 8 7  '$ Creatinine 0.6 - 1.1 mg/dL 0.8 0.52 0.58  Sodium 136 - 145 mEq/L 125(L) 127(L) 126(L)  Potassium 3.5 - 5.1 mEq/L 3.3(L) 5.1 4.7  Chloride 101 - 111 mmol/L - 94(L) 94(L)  CO2 22 - 29 mEq/L '28 25 24  '$ Calcium 8.4 - 10.4 mg/dL 9.5 8.2(L) 8.3(L)  Total Protein 6.4 - 8.3 g/dL 6.3(L) - -  Total Bilirubin 0.20 - 1.20 mg/dL 0.44 - -   Alkaline Phos 40 - 150 U/L 281(H) - -  AST 5 - 34 U/L 43(H) - -  ALT 0 - 55 U/L 38 - -    Radiological Studies: Ct Head Wo Contrast  07/24/2015  CLINICAL DATA:  Slipped and fell face forward, hitting left side of head. Headache. Initial encounter. EXAM: CT HEAD WITHOUT CONTRAST TECHNIQUE: Contiguous axial images were obtained from the base of the skull through the vertex without intravenous contrast. COMPARISON:  CT of the head performed 07/22/2015 FINDINGS: There is no evidence of acute infarction, mass lesion, or intra- or extra-axial hemorrhage on CT. The posterior fossa, including the cerebellum, brainstem and fourth ventricle, is within normal limits. The third and lateral ventricles, and basal ganglia are unremarkable in appearance. The cerebral hemispheres are symmetric in appearance, with normal gray-white differentiation. No mass effect or midline shift is seen. There is no evidence of fracture; visualized osseous structures are unremarkable in appearance. The orbits are within normal limits. There is partial opacification of the left maxillary sinus. There is mild partial opacification of the left mastoid air cells. The remaining paranasal sinuses and right mastoid air cells are well-aerated. No significant soft tissue abnormalities are seen. IMPRESSION: 1. No evidence of traumatic intracranial injury or fracture. 2. Partial opacification of the left maxillary sinus and mild partial opacification of the left mastoid air cells. Electronically Signed   By: Garald Balding M.D.   On: 07/24/2015 04:58   Ct Head Wo Contrast  07/22/2015  CLINICAL DATA:  Small cell lung carcinoma.  Headache. EXAM: CT HEAD WITHOUT CONTRAST TECHNIQUE: Contiguous axial images were obtained from the base of the skull through the vertex without intravenous contrast. COMPARISON:  Head CT June 06, 2015; brain MRI June 26, 2015 FINDINGS: The ventricles are normal in size and configuration. There is no intracranial  mass, hemorrhage, extra-axial fluid collection, or midline shift. There is mild small vessel disease in the centra semiovale bilaterally. There is no new gray-white compartment lesion. No acute infarct evident. The bony calvarium appears intact. There is patchy mastoid air cell disease on the left, stable. Mastoids on the right are clear. There is apparent cerumen in the right external auditory canal, a finding also present previously. There is a retention cyst in the posterior left maxillary antrum. There is ethmoid sinus mucosal thickening bilaterally. No intraorbital lesions are identified. IMPRESSION: Mild periventricular small vessel disease. No intracranial mass, hemorrhage, or extra-axial fluid collection. 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 Cerumen right external auditory canal. Areas of paranasal sinus disease as noted above. Electronically Signed   By: Lowella Grip III M.D.   On: 07/22/2015 08:05   Nm Pet Image Initial (pi) Skull Base To Thigh  07/12/2015  CLINICAL DATA:  Initial treatment strategy for newly diagnosed small-cell lung carcinoma based on EBUS FNA of a subcarinal lymph node on 06/21/2015. EXAM: NUCLEAR MEDICINE PET SKULL BASE TO THIGH TECHNIQUE: 12.3 mCi F-18 FDG was injected intravenously. Full-ring PET imaging was performed from the skull base to thigh after the radiotracer. CT data was obtained and used for attenuation correction and anatomic localization. FASTING BLOOD GLUCOSE:  Value: 224 mg/dl COMPARISON:  06/19/2015 chest CT.  06/17/2015 CT abdomen/ pelvis. FINDINGS: NECK No hypermetabolic lymph nodes in the neck. Mucous retention cyst versus polyp in the inferior posterior left maxillary sinus. CHEST There is a hypermetabolic 2.2 x 2.1 cm posterior right lower lobe pulmonary nodule (series 3/image 118) with max SUV 4.0, increased in size from 1.5 x 1.3 cm on 06/19/2015. There is a suggestion of a new 0.6 cm anterior right lower lobe pulmonary nodule (3/123), below PET  resolution. No acute consolidative airspace disease. Mosaic attenuation throughout the lungs. There are multiple hypermetabolic ipsilateral right peribronchial nodes, largest 2.0 cm (series 3/image 90) with max SUV 10.2. There is bulky subcarinal hypermetabolic adenopathy measuring 3.6 cm (series 3/image 93) with max SUV 11.2. There is a mildly hypermetabolic and mildly enlarged right lower paratracheal 1.1 cm node (series 3/image 87) with max SUV 4.3. No contralateral mediastinal, contralateral hilar or supraclavicular hypermetabolic adenopathy. No hypermetabolic axillary nodes. Left anterior descending and right coronary atherosclerosis. Mitral annular calcifications. ABDOMEN/PELVIS No abnormal hypermetabolic activity within the liver, pancreas, adrenal glands, or spleen. No hypermetabolic lymph nodes in the abdomen or pelvis. Mild diffuse hepatic steatosis. Status post cholecystectomy. Two clustered 3 mm nonobstructing stones in the lower left kidney. Malrotated left kidney. Mild fullness of the left renal collecting system without overt hydronephrosis. SKELETON No focal hypermetabolic activity to suggest skeletal metastasis. IMPRESSION: 1. Hypermetabolic 2.2 cm right lower lobe pulmonary nodule, in keeping with a primary bronchogenic carcinoma, which has grown since 06/19/2015. 2. Hypermetabolic ipsilateral peribronchial, subcarinal and ipsilateral paratracheal lymphadenopathy. 3. Suggestion of a new 6 mm right lower lobe pulmonary nodule, below PET resolution, suspect an ipsilateral primary tumor lobe pulmonary metastasis. 4. No extrathoracic hypermetabolic metastatic disease. 5. TNM Stage IIIa by PET-CT (T3 due to suspected ipsilateral primary tumor lobe metastasis N2 M0). 6. Additional findings including 2 vessel coronary atherosclerosis, mild diffuse hepatic steatosis and nonobstructing tiny left renal stones. Electronically Signed   By: Ilona Sorrel M.D.   On: 07/12/2015 10:56   Ir Fluoro Guide Cv Line  Right  07/18/2015  CLINICAL DATA:  Lung carcinoma, needs venous access for chemotherapy EXAM: TUNNELED PORT CATHETER PLACEMENT WITH ULTRASOUND AND FLUOROSCOPIC GUIDANCE FLUOROSCOPY TIME:  0.4 minutes, 286  uGym2 DAP ANESTHESIA/SEDATION: Intravenous Fentanyl and Versed were administered as conscious sedation during continuous cardiorespiratory monitoring by the radiology RN, with a total moderate sedation time of 17 minutes. TECHNIQUE: The procedure, risks, benefits, and alternatives were explained to the patient. Questions regarding the procedure were encouraged and answered. The patient understands and consents to the procedure. As antibiotic prophylaxis, cefazolin 2 g was ordered pre-procedure and administered intravenously within one hour of incision. Patency of the right IJ vein was confirmed with ultrasound with image documentation. An appropriate skin site was determined. Skin site was marked. Region was prepped using maximum barrier technique including cap and mask, sterile gown, sterile gloves, large sterile sheet, and Chlorhexidine as cutaneous antisepsis. The region was infiltrated locally with 1% lidocaine.Under real-time ultrasound guidance, the right IJ vein was accessed with a 21 gauge micropuncture needle; the needle tip within the vein was confirmed with ultrasound image documentation. Needle was exchanged over a 018 guidewire for transitional dilator which allowed passage of the [facility] wire into the IVC. Over this, the transitional dilator was exchanged for a 5 [place] MPA catheter. A small incision was made on the right anterior chest wall and a subcutaneous pocket fashioned. The power-injectable port was positioned and its catheter tunneled to the right IJ dermatotomy site. The MPA catheter was exchanged  over an Amplatz wire for a peel-away sheath, through which the port catheter, which had been trimmed to the appropriate length, was advanced and positioned under fluoroscopy with its tip at the  cavoatrial junction. Spot chest radiograph confirms good catheter position and no pneumothorax. The pocket was closed with deep interrupted and subcuticular continuous 3-0 Monocryl sutures. The port was flushed per protocol. The incisions were covered with Dermabond then covered with a sterile dressing. COMPLICATIONS: COMPLICATIONS None immediate IMPRESSION: Technically successful right IJ power-injectable port catheter placement. Ready for routine use. Electronically Signed   By: Lucrezia Europe M.D.   On: 07/18/2015 09:29   Ir US Guide Vasc Access Right  07/18/2015  CLINICAL DATA:  Lung carcinoma, needs venous access for chemotherapy EXAM: TUNNELED PORT CATHETER PLACEMENT WITH ULTRASOUND AND FLUOROSCOPIC GUIDANCE FLUOROSCOPY TIME:  0.4 minutes, 286  uGym2 DAP ANESTHESIA/SEDATION: Intravenous Fentanyl and Versed were administered as conscious sedation during continuous cardiorespiratory monitoring by the radiology RN, with a total moderate sedation time of 17 minutes. TECHNIQUE: The procedure, risks, benefits, and alternatives were explained to the patient. Questions regarding the procedure were encouraged and answered. The patient understands and consents to the procedure. As antibiotic prophylaxis, cefazolin 2 g was ordered pre-procedure and administered intravenously within one hour of incision. Patency of the right IJ vein was confirmed with ultrasound with image documentation. An appropriate skin site was determined. Skin site was marked. Region was prepped using maximum barrier technique including cap and mask, sterile gown, sterile gloves, large sterile sheet, and Chlorhexidine as cutaneous antisepsis. The region was infiltrated locally with 1% lidocaine.Under real-time ultrasound guidance, the right IJ vein was accessed with a 21 gauge micropuncture needle; the needle tip within the vein was confirmed with ultrasound image documentation. Needle was exchanged over a 018 guidewire for transitional dilator which  allowed passage of the [facility] wire into the IVC. Over this, the transitional dilator was exchanged for a 5 [place] MPA catheter. A small incision was made on the right anterior chest wall and a subcutaneous pocket fashioned. The power-injectable port was positioned and its catheter tunneled to the right IJ dermatotomy site. The MPA catheter was exchanged over an Amplatz wire for a peel-away sheath, through which the port catheter, which had been trimmed to the appropriate length, was advanced and positioned under fluoroscopy with its tip at the cavoatrial junction. Spot chest radiograph confirms good catheter position and no pneumothorax. The pocket was closed with deep interrupted and subcuticular continuous 3-0 Monocryl sutures. The port was flushed per protocol. The incisions were covered with Dermabond then covered with a sterile dressing. COMPLICATIONS: COMPLICATIONS None immediate IMPRESSION: Technically successful right IJ power-injectable port catheter placement. Ready for routine use. Electronically Signed   By: Lucrezia Europe M.D.   On: 07/18/2015 09:29    Impression and Plan: This is a very pleasant 54 years old white female recently diagnosed initially limited stage (T1a, N2, M0) small cell lung cancer presented with right lower lobe pulmonary nodule in addition to mediastinal lymphadenopathy, diagnosed in November 2016 pending further staging workup with a PET scan.  PET scan on 12/1 showed Hypermetabolic 2.2 cm right lower lobe pulmonary nodule, in keeping with a primary bronchogenic carcinoma, which has grown [id number],  Hypermetabolic ipsilateral peribronchial, subcarinal and ipsilateral paratracheal lymphadenopathy and a  suggestion of a new 6 mm right lower lobe pulmonary nodule  suspect an ipsilateral primary tumor lobe pulmonarymetastasis.  No extrathoracic hypermetabolic metastatic disease. She is now limited Stage III A Recommended for the  patient a course of concurrent chemoradiation  with systemic chemotherapy in the form of carboplatin for AUC of 5 on day 1 and etoposide 120 MG/M2 on days 1, 2 and 3 with Neulasta support on day 4. The patient will be treated for a total of 4-6 cycles of this regimen. She is expected to receive concurrent radiotherapy during the first 2 cycles of her treatment. She was to start on 12/14 but due to hospitalization for nausea due to hyponatremia, this was postponed to 12/20-13/23 with Neulasta on 12/24. Discussed with the patient adverse effect of the chemotherapy including but not limited to alopecia, myelosuppression, nausea and vomiting, peripheral neuropathy, liver or renal dysfunction. Made the decision not to use cisplatin in this patient because of her history of diabetes mellitus in addition to concern about delayed nausea.  She received chemotherapy education class before the first cycle of her chemotherapy. She is to receive cycle 2 on 08/31/15 along with radiation Due to her pancytopenia related to recent chemotherapy, a Prescription for Cipro 500 mg by mouth twice a day for 5 days was given to her. She was also instructed to avoid crowds, or wear mask if a numerous group is present. She was also instructed to go to the emergency department if she develops any fever, shields or any symptoms of neutropenic fever. 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 She was advised to increase her salt intake during this time. New values are pending For her malnutrition, after referral to a Nutritionist this has improved The patient would come back for follow-up visit on 1/10 for reevaluation and management of any adverse effect of her treatment, prior to chemo. She will continue with her radiation treatments She was advised to call immediately if she has any concerning symptoms in the interval. The patient voices  understanding of current disease status and treatment options and is in agreement with the current care plan.  All questions were answered. The patient knows to call the clinic with any problems, questions or concerns. We can certainly see the patient much sooner if necessary.   Sharene Butters E, PA-C 12/28/20168:29 AM  ADDENDUM: Hematology/Oncology Attending: I had a face to face encounter with the patient today. I recommended her care plan. This is a very pleasant 54 years old white female recently diagnosed with limited stage small cell lung cancer. The patient started the first cycle of her systemic chemotherapy with carboplatin and etoposide last week and tolerated the first week of her treatment fairly well. She has mild fatigue but no significant nausea or vomiting. She has no fever or chills. Her absolute neutrophil count is low. I will start the patient empirically on Cipro 500 mg by mouth twice a day for 5 days during her neutropenic phase. She was given neutropenic precautions and was advised to come immediately to the emergency department if she develop any fever or chills. The patient would come back for follow-up visit in 2 weeks for reevaluation before starting cycle #2. She was advised to call immediately if she has any concerning symptoms in the interval.  Disclaimer: This note was dictated with voice recognition software. Similar sounding words can inadvertently be transcribed and may be missed upon review. Eilleen Kempf., MD 08/08/2015

## 2015-08-09 ENCOUNTER — Emergency Department (HOSPITAL_COMMUNITY): Payer: Medicaid Other

## 2015-08-09 ENCOUNTER — Other Ambulatory Visit: Payer: Self-pay

## 2015-08-09 ENCOUNTER — Ambulatory Visit
Admission: RE | Admit: 2015-08-09 | Discharge: 2015-08-09 | Disposition: A | Discharge status: Home / Self Care | Payer: Medicaid Other | Source: Ambulatory Visit | Attending: Radiation Oncology | Admitting: Radiation Oncology

## 2015-08-09 ENCOUNTER — Emergency Department (HOSPITAL_COMMUNITY)
Admission: EM | Admit: 2015-08-09 | Discharge: 2015-08-09 | Disposition: A | Discharge status: Home / Self Care | Payer: Medicaid Other | Attending: Emergency Medicine | Admitting: Emergency Medicine

## 2015-08-09 ENCOUNTER — Encounter (HOSPITAL_COMMUNITY): Payer: Self-pay

## 2015-08-09 DIAGNOSIS — Z79899 Other long term (current) drug therapy: Secondary | ICD-10-CM | POA: Insufficient documentation

## 2015-08-09 DIAGNOSIS — R1310 Dysphagia, unspecified: Secondary | ICD-10-CM | POA: Insufficient documentation

## 2015-08-09 DIAGNOSIS — Z8669 Personal history of other diseases of the nervous system and sense organs: Secondary | ICD-10-CM | POA: Diagnosis not present

## 2015-08-09 DIAGNOSIS — J029 Acute pharyngitis, unspecified: Secondary | ICD-10-CM | POA: Diagnosis not present

## 2015-08-09 DIAGNOSIS — J441 Chronic obstructive pulmonary disease with (acute) exacerbation: Secondary | ICD-10-CM | POA: Diagnosis not present

## 2015-08-09 DIAGNOSIS — Z7951 Long term (current) use of inhaled steroids: Secondary | ICD-10-CM | POA: Diagnosis not present

## 2015-08-09 DIAGNOSIS — Z510 Encounter for antineoplastic radiation therapy: Secondary | ICD-10-CM | POA: Diagnosis not present

## 2015-08-09 DIAGNOSIS — E875 Hyperkalemia: Secondary | ICD-10-CM | POA: Diagnosis not present

## 2015-08-09 DIAGNOSIS — R0602 Shortness of breath: Secondary | ICD-10-CM | POA: Diagnosis present

## 2015-08-09 DIAGNOSIS — Z87448 Personal history of other diseases of urinary system: Secondary | ICD-10-CM | POA: Diagnosis not present

## 2015-08-09 DIAGNOSIS — Z7982 Long term (current) use of aspirin: Secondary | ICD-10-CM | POA: Diagnosis not present

## 2015-08-09 DIAGNOSIS — F319 Bipolar disorder, unspecified: Secondary | ICD-10-CM | POA: Insufficient documentation

## 2015-08-09 DIAGNOSIS — Z872 Personal history of diseases of the skin and subcutaneous tissue: Secondary | ICD-10-CM | POA: Diagnosis not present

## 2015-08-09 DIAGNOSIS — Z862 Personal history of diseases of the blood and blood-forming organs and certain disorders involving the immune mechanism: Secondary | ICD-10-CM | POA: Diagnosis not present

## 2015-08-09 DIAGNOSIS — K219 Gastro-esophageal reflux disease without esophagitis: Secondary | ICD-10-CM | POA: Insufficient documentation

## 2015-08-09 DIAGNOSIS — G8929 Other chronic pain: Secondary | ICD-10-CM | POA: Diagnosis not present

## 2015-08-09 DIAGNOSIS — Z87891 Personal history of nicotine dependence: Secondary | ICD-10-CM | POA: Diagnosis not present

## 2015-08-09 DIAGNOSIS — E119 Type 2 diabetes mellitus without complications: Secondary | ICD-10-CM | POA: Insufficient documentation

## 2015-08-09 DIAGNOSIS — Z794 Long term (current) use of insulin: Secondary | ICD-10-CM | POA: Diagnosis not present

## 2015-08-09 LAB — I-STAT CHEM 8, ED
BUN: 4 mg/dL — ABNORMAL LOW (ref 6–20)
CREATININE: 0.5 mg/dL (ref 0.44–1.00)
Calcium, Ion: 1.09 mmol/L — ABNORMAL LOW (ref 1.12–1.23)
Chloride: 93 mmol/L — ABNORMAL LOW (ref 101–111)
Glucose, Bld: 272 mg/dL — ABNORMAL HIGH (ref 65–99)
HEMATOCRIT: 33 % — AB (ref 36.0–46.0)
HEMOGLOBIN: 11.2 g/dL — AB (ref 12.0–15.0)
Potassium: 3.1 mmol/L — ABNORMAL LOW (ref 3.5–5.1)
SODIUM: 132 mmol/L — AB (ref 135–145)
TCO2: 28 mmol/L (ref 0–100)

## 2015-08-09 LAB — CBC WITH DIFFERENTIAL/PLATELET
BASOS ABS: 0 10*3/uL (ref 0.0–0.1)
Basophils Relative: 0 %
EOS ABS: 0 10*3/uL (ref 0.0–0.7)
Eosinophils Relative: 2 %
HCT: 30 % — ABNORMAL LOW (ref 36.0–46.0)
HEMOGLOBIN: 10.4 g/dL — AB (ref 12.0–15.0)
LYMPHS PCT: 90 %
Lymphs Abs: 1 10*3/uL (ref 0.7–4.0)
MCH: 29.8 pg (ref 26.0–34.0)
MCHC: 34.7 g/dL (ref 30.0–36.0)
MCV: 86 fL (ref 78.0–100.0)
Monocytes Absolute: 0 10*3/uL — ABNORMAL LOW (ref 0.1–1.0)
Monocytes Relative: 1 %
NEUTROS ABS: 0.1 10*3/uL — AB (ref 1.7–7.7)
Neutrophils Relative %: 7 %
Platelets: 57 10*3/uL — ABNORMAL LOW (ref 150–400)
RBC: 3.49 MIL/uL — ABNORMAL LOW (ref 3.87–5.11)
RDW: 14.4 % (ref 11.5–15.5)
WBC: 1.1 10*3/uL — CL (ref 4.0–10.5)

## 2015-08-09 LAB — RAPID URINE DRUG SCREEN, HOSP PERFORMED
AMPHETAMINES: NOT DETECTED
Barbiturates: NOT DETECTED
Benzodiazepines: NOT DETECTED
Cocaine: NOT DETECTED
OPIATES: NOT DETECTED
Tetrahydrocannabinol: NOT DETECTED

## 2015-08-09 LAB — RAPID STREP SCREEN (MED CTR MEBANE ONLY): Streptococcus, Group A Screen (Direct): NEGATIVE

## 2015-08-09 LAB — I-STAT TROPONIN, ED: TROPONIN I, POC: 0 ng/mL (ref 0.00–0.08)

## 2015-08-09 LAB — BRAIN NATRIURETIC PEPTIDE: B NATRIURETIC PEPTIDE 5: 25.1 pg/mL (ref 0.0–100.0)

## 2015-08-09 MED ORDER — SUCRALFATE 1 GM/10ML PO SUSP: 1.0000 g | Freq: Three times a day (TID) | ORAL | Status: DC

## 2015-08-09 MED ORDER — GI COCKTAIL ~~LOC~~
30.0000 mL | Freq: Once | ORAL | Status: AC
  Administered 2015-08-09: 30 mL via ORAL
  Filled 2015-08-09: qty 30

## 2015-08-09 NOTE — ED Notes (Signed)
[facility] called  

## 2015-08-09 NOTE — ED Notes (Signed)
Pt getting anxious. Stating that she is choking and doesn't know what's wrong with her. Pt is speaking in complete sentences and has an O2 sat of 99% on room air. She is also requesting a Diet Coke at this time.

## 2015-08-09 NOTE — ED Notes (Signed)
Pt BIB EMS. States it feels like a pill has been stuck in her throat for 2 days. Today was her first tx of radiation for lung cancer. Also complaining of difficulty eating. Denies V/F/D. Endorses nausea.  EMS vitals 140/98, HR 90, 97%

## 2015-08-09 NOTE — ED Notes (Signed)
Delay in urine collection, patient already used restroom. Will obtain another sample when she needs to go.

## 2015-08-09 NOTE — ED Provider Notes (Signed)
CSN: [id number]     Arrival date & time 08/09/15  0022 History  By signing my name below, I, Emmanuella Mensah, attest that this documentation has been prepared under the direction and in the presence of Arnie Clingenpeel, MD. Electronically Signed: Judithann Sauger, ED Scribe. 08/09/2015. 1:46 AM.     Chief Complaint  Patient presents with  . Shortness of Breath   Patient is a 54 y.o. female presenting with GERD and pharyngitis. The history is provided by the patient. No language interpreter was used.  Gastroesophageal Reflux This is a new problem. The current episode started more than 2 days ago. The problem occurs constantly. The problem has been gradually worsening. Pertinent negatives include no chest pain, no abdominal pain and no shortness of breath. Nothing aggravates the symptoms. Nothing relieves the symptoms. She has tried nothing for the symptoms. The treatment provided no relief.  Sore Throat This is a new problem. The current episode started more than 2 days ago. The problem occurs constantly. The problem has been gradually worsening. Pertinent negatives include no chest pain, no abdominal pain and no shortness of breath. Nothing aggravates the symptoms. Nothing relieves the symptoms. She has tried nothing for the symptoms. The treatment provided no relief.   HPI Comments: GILIANA VANTIL is a 54 y.o. female with a hx of DM, pancreatitis, and anemia who presents to the Emergency Department complaining of gradually worsening trouble swallowing onset several days. She explains that she feels as though "she has had a pill stuck in her throat". She reports associated pain when she swallows or eats. She reports that she had her first right upper chest radiation yesterday and she has already started her chemotherapy. She notes that her symptoms seem to be consistent with indigestion but denies a hx of GERD. She denies taking Prilosec although it is listed on her medication list. Pt was unable  to specify if she is currently taking the Prilosec. No alleviating factors noted.    Past Medical History  Diagnosis Date  . Diabetes mellitus   . Pancreatitis   . Anemia   . Bipolar 1 disorder ([place])   . Alcohol abuse   . 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 COPD (chronic obstructive pulmonary disease) ([place])   . Hypotension   . Major depressive disorder ([place])   . Cocaine abuse   . Chronic back pain   . Renal disorder   . Acidosis   . Hyperkalemia   . Abscess   . Acute encephalopathy   . Septic shock ([place])   . Chemotherapy induced neutropenia ([place]) 08/08/2015   Past Surgical History  Procedure Laterality Date  . Cholecystectomy    . Appendectomy    . Tonsillectomy    . Incision and drainage abscess Right 03/01/2013    Procedure: INCISION AND DRAINAGE ABSCESS;  Surgeon: Harl Bowie, MD;  Location: [facility];  Service: General;  Laterality: Right;  . Cesarean section    . Video bronchoscopy with endobronchial ultrasound N/A 06/21/2015    Procedure: VIDEO BRONCHOSCOPY WITH ENDOBRONCHIAL ULTRASOUND;  Surgeon: Javier Glazier, MD;  Location: [facility];  Service: Thoracic;  Laterality: N/A;  . Flexible bronchoscopy N/A 06/21/2015    Procedure: BRONCHOSCOPY;  Surgeon: Javier Glazier, MD;  Location: [facility];  Service: Thoracic;  Laterality: N/A;   Family History  Problem Relation Age of Onset  . Diabetes Mother   . Cancer Mother     breast   Social  History  Substance Use Topics  . Smoking status: Former Smoker -- 0.00 packs/day    Quit date: 07/15/2015  . Smokeless tobacco: Never Used  . Alcohol Use: No     Comment: hx of etoh abuse   OB History    No data available     Review of Systems  HENT: Positive for sore throat and trouble swallowing. Negative for drooling, facial swelling and voice change.   Respiratory: Negative for shortness of breath.   Cardiovascular: Negative for chest pain, palpitations and leg swelling.  Gastrointestinal: Negative  for vomiting and abdominal pain.  All other systems reviewed and are negative.     Allergies  Review of patient's allergies indicates no known allergies.  Home Medications   Prior to Admission medications   Medication Sig Start Date End Date Taking? Authorizing Provider  acetaminophen (TYLENOL) 325 MG tablet Take 325 mg by mouth 3 (three) times daily. Prior to fish oil   Yes Historical Provider, MD  albuterol (PROVENTIL HFA;VENTOLIN HFA) 108 (90 BASE) MCG/ACT inhaler Inhale 2 puffs into the lungs every 4 (four) hours as needed for wheezing or shortness of breath. 76/1/95  Yes Delora Fuel, MD  aspirin EC 81 MG tablet Take 1 tablet (81 mg total) by mouth daily. 06/25/15  Yes Velvet Bathe, MD  budesonide-formoterol (SYMBICORT) 80-4.5 MCG/ACT inhaler Inhale 2 puffs into the lungs 2 (two) times daily.   Yes Historical Provider, MD  calcium-vitamin D (OSCAL WITH D) 500-200 MG-UNIT tablet Take 1 tablet by mouth 2 (two) times daily.   Yes Historical Provider, MD  cholecalciferol (VITAMIN D) 1000 UNITS tablet Take 1,000 Units by mouth daily.   Yes Historical Provider, MD  docusate sodium (COLACE) 100 MG capsule Take 100 mg by mouth 2 (two) times daily.   Yes Historical Provider, MD  gabapentin (NEURONTIN) 300 MG capsule Take 600 mg by mouth 3 (three) times daily.   Yes Historical Provider, MD  ipratropium-albuterol (DUONEB) 0.5-2.5 (3) MG/3ML SOLN Take 3 mLs by nebulization every 6 (six) hours as needed. 06/11/15  Yes Orson Eva, MD  lamoTRIgine (LAMICTAL) 100 MG tablet Take 100 mg by mouth 2 (two) times daily. Take with 25 mg tablet   Yes Historical Provider, MD  lamoTRIgine (LAMICTAL) 25 MG tablet Take 25 mg by mouth 2 (two) times daily. Takes with '100mg'$  tablet   Yes Historical Provider, MD  lidocaine-prilocaine (EMLA) cream Apply 1 application topically as needed. Apply 1 hr prior to chemotherapy 07/18/15 07/17/16 Yes Coralee Pesa Wertman, PA-C  LORazepam (ATIVAN) 0.5 MG tablet Take 1 tablet (0.5 mg total)  by mouth every 8 (eight) hours as needed for anxiety. 07/25/15  Yes Reyne Dumas, MD  magnesium oxide (MAG-OX) 400 (241.3 MG) MG tablet Take 2 tablets (800 mg total) by mouth 3 (three) times daily. 07/25/15  Yes Reyne Dumas, MD  niacin (NIASPAN) 750 MG CR tablet Take 750 mg by mouth at bedtime.   Yes Historical Provider, MD  omeprazole (PRILOSEC) 20 MG capsule Take 20 mg by mouth daily before supper.    Yes Historical Provider, MD  oxyCODONE-acetaminophen (PERCOCET/ROXICET) 5-325 MG tablet Take 1 tablet by mouth every 8 (eight) hours as needed for severe pain. 08/01/15  Yes Coralee Pesa Wertman, PA-C  potassium chloride 20 MEQ TBCR Take 40 mEq by mouth 2 (two) times daily. 07/26/15  Yes Reyne Dumas, MD  prochlorperazine (COMPAZINE) 10 MG tablet Take 1 tablet (10 mg total) by mouth every 6 (six) hours as needed for nausea or vomiting. 08/01/15  Yes Rondel Jumbo, PA-C  promethazine (PHENERGAN) 12.5 MG tablet Take 12.5 mg by mouth every 6 (six) hours as needed for nausea or vomiting.   Yes Historical Provider, MD  QUEtiapine (SEROQUEL XR) 400 MG 24 hr tablet Take 800 mg by mouth at bedtime.   Yes Historical Provider, MD  simvastatin (ZOCOR) 40 MG tablet Take 40 mg by mouth daily at 6 PM.    Yes Historical Provider, MD  tiotropium (SPIRIVA) 18 MCG inhalation capsule Place 18 mcg into inhaler and inhale daily.   Yes Historical Provider, MD  zolpidem (AMBIEN) 10 MG tablet Take 0.5 tablets (5 mg total) by mouth at bedtime. 07/25/15  Yes Reyne Dumas, MD  ciprofloxacin (CIPRO) 500 MG tablet Take 1 tablet (500 mg total) by mouth 2 (two) times daily. 08/08/15   Rondel Jumbo, PA-C  insulin aspart (NOVOLOG) 100 UNIT/ML injection Inject 5 Units into the skin 3 (three) times daily with meals. Patient not taking: Reported on 08/09/2015 06/11/15   Orson Eva, MD  insulin glargine (LANTUS) 100 UNIT/ML injection Inject 0.1 mLs (10 Units total) into the skin at bedtime. Patient not taking: Reported on 08/09/2015  06/11/15   Orson Eva, MD  Wound Dressings (SONAFINE) Apply 1 application topically 2 (two) times daily. 08/08/15   Kyung Rudd, MD   BP 151/88 mmHg  Pulse 113  Temp(Src) 99.5 F (37.5 C) (Oral)  Resp 23  SpO2 99% Physical Exam  Constitutional: She is oriented to person, place, and time. She appears well-developed and well-nourished. No distress.  Intact phonation no trismus and is swallowing own secretions  HENT:  Head: Normocephalic and atraumatic.  Mouth/Throat: Oropharynx is clear and moist. No oropharyngeal exudate.  mallempati class 2   Eyes: Conjunctivae and EOM are normal. Pupils are equal, round, and reactive to light.  Neck: Normal range of motion. Neck supple. No tracheal deviation present.  Cardiovascular: Normal rate and regular rhythm.   Pulmonary/Chest: Effort normal and breath sounds normal. No stridor. No respiratory distress. She has no wheezes. She has no rales. She exhibits no tenderness.  Lungs are clear  Abdominal: Soft. Bowel sounds are normal. There is no tenderness. There is no rebound and no guarding.  Musculoskeletal: Normal range of motion. She exhibits no edema or tenderness.  Lymphadenopathy:    She has no cervical adenopathy.  Neurological: She is alert and oriented to person, place, and time. She has normal reflexes.  DTRs intact.   Skin: Skin is warm and dry.  Psychiatric: She has a normal mood and affect. Her behavior is normal.  Nursing note and vitals reviewed.   ED Course  Procedures (including critical care time) DIAGNOSTIC STUDIES: Oxygen Saturation is 99% on RA, normal by my interpretation.    COORDINATION OF CARE: 1:29 AM- Pt advised of plan for treatment and pt agrees. Pt will receive a chest x-ray and lab work for further evaluation.    Labs Review Labs Reviewed  I-STAT CHEM 8, ED - Abnormal; Notable for the following:    Sodium 132 (*)    Potassium 3.1 (*)    Chloride 93 (*)    BUN 4 (*)    Glucose, Bld 272 (*)    Calcium, Ion  1.09 (*)    Hemoglobin 11.2 (*)    HCT 33.0 (*)    All other components within normal limits  CBC WITH DIFFERENTIAL/PLATELET  BRAIN NATRIURETIC PEPTIDE  I-STAT TROPOININ, ED    Imaging Review No results found.   Veatrice Kells, MD  has personally reviewed and evaluated these images and lab results as part of her medical decision-making.   EKG Interpretation None      MDM   Final diagnoses:  None    EKG Interpretation  Date/Time:  Thursday August 09 2015 00:56:15 EST Ventricular Rate:  110 PR Interval:  142 QRS Duration: 100 QT Interval:  337 QTC Calculation: 456 R Axis:   20 Text Interpretation:  Sinus tachycardia Confirmed by [facility]  MD, Emmaline Kluver ([id number]) on 08/09/2015 2:01:58 AM        Was told that throat pain might be a side effect of radiation.  Patient denied SOB DOE CP n/v/d.  All complaints are in throat.  No signs of thrush.  White count improving.  Swallowing and resting comfortably in the ED.  Inform your oncologist of these symptoms in the AM.  Return for fevers, DOE SOB inability to swallow neck swelling or any concerns.  Will start carafate  I personally performed the services described in this documentation, which was scribed in my presence. The recorded information has been reviewed and is accurate.     Veatrice Kells, MD 08/09/15 [phone]

## 2015-08-09 NOTE — ED Notes (Signed)
Pt was able to take PO GI cocktail with no difficulty

## 2015-08-09 NOTE — ED Notes (Signed)
Bed: WA24 Expected date:  Expected time:  Means of arrival:  Comments: Pt in room, hold for [place] B

## 2015-08-09 NOTE — ED Notes (Signed)
Attempted to show EKG to Dr Randal Buba and she requested it be place on her desk.

## 2015-08-10 ENCOUNTER — Ambulatory Visit
Admission: RE | Admit: 2015-08-10 | Discharge: 2015-08-10 | Disposition: A | Discharge status: Home / Self Care | Payer: Medicaid Other | Source: Ambulatory Visit | Attending: Radiation Oncology | Admitting: Radiation Oncology

## 2015-08-10 ENCOUNTER — Encounter: Payer: Self-pay | Admitting: Radiation Oncology

## 2015-08-10 VITALS — BP 128/66 | HR 124 | Temp 98.9°F | Ht 63.0 in | Wt 201.3 lb

## 2015-08-10 DIAGNOSIS — C3431 Malignant neoplasm of lower lobe, right bronchus or lung: Secondary | ICD-10-CM

## 2015-08-10 DIAGNOSIS — Z510 Encounter for antineoplastic radiation therapy: Secondary | ICD-10-CM | POA: Diagnosis not present

## 2015-08-10 NOTE — Progress Notes (Signed)
  Radiation Oncology         [phone]   Name: Sarah Leonard MRN: [id number]   Date: 08/10/2015  DOB: 06-29-1961     Weekly Radiation Therapy Management    ICD-9-CM ICD-10-CM   1. Cancer of lower lobe of right lung (HCC) 162.5 C34.31     Current Dose: 5.4 Gy  Planned Dose:  59.4 Gy  Narrative The patient presents for routine under treatment assessment.  Sarah Leonard is here for her 3rd fraction of radiation to her Lung. She complains of severe 10/10 in her throat and between her breasts. She states it began as hurting only when she swallowed, but now it is a constant pain. She was prescribed sucralfate when she went to the ED 08/08/15. She took the sucralfate today, and reports it has not helped her at all. She is not able to swallow, and has not eaten well the past few days because of pain when she swallows. She also feels sleepy most days, and rests quite a bit. Her skin is intact, and normal appearing over the radiation field, and she is using the radiaplex cream twice daily.  The patient is without complaint. Set-up films were reviewed. The chart was checked.  Physical Findings  height is '5\' 3"'$  (1.6 m) and weight is 201 lb 4.8 oz (91.309 kg). Her temperature is 98.9 F (37.2 C). Her blood pressure is 128/66 and her pulse is 124. . Weight essentially stable.  No significant changes.  Impression The patient is tolerating radiation.  Plan Continue treatment as planned. She will use the Carafate and Oxycodone to help the pain.         Sheral Apley Tammi Klippel, M.D.  This document serves as a record of services personally performed by Tyler Pita, MD. It was created on his behalf by Lendon Collar, a trained medical scribe. The creation of this record is based on the scribe's personal observations and the provider's statements to them. This document has been checked and approved by the attending provider.

## 2015-08-10 NOTE — Progress Notes (Signed)
Sarah Leonard is here for her 3rd fraction of radiation to her Lung. She complains of severe 10/10 in her throat and between her breasts. She states it began as hurting only when she swallowed, but now it is a constant pain. She was prescribed sucralfate when she went to the ED 08/08/15. She took the sucralfate today, and reports it has not helped her at all. She is not able to swallow, and has not eaten well the past few days because of pain when she swallows. She also feels sleepy most days, and rests quite a bit. Her skin is intact, and normal appearing over the radiation field, and she is using the radiaplex cream twice daily.  BP 128/66 mmHg  Pulse 124  Temp(Src) 98.9 F (37.2 C)  Ht '5\' 3"'$  (1.6 m)  Wt 201 lb 4.8 oz (91.309 kg)  BMI 35.67 kg/m2   Wt Readings from Last 3 Encounters:  08/10/15 201 lb 4.8 oz (91.309 kg)  08/08/15 217 lb 8 oz (98.657 kg)  07/31/15 214 lb (97.07 kg)

## 2015-08-11 LAB — CULTURE, GROUP A STREP: STREP A CULTURE: NEGATIVE

## 2015-08-14 ENCOUNTER — Ambulatory Visit
Admission: RE | Admit: 2015-08-14 | Discharge: 2015-08-14 | Disposition: A | Discharge status: Home / Self Care | Payer: Medicaid Other | Source: Ambulatory Visit | Attending: Radiation Oncology | Admitting: Radiation Oncology

## 2015-08-14 ENCOUNTER — Other Ambulatory Visit: Payer: Self-pay | Admitting: *Deleted

## 2015-08-14 DIAGNOSIS — Z510 Encounter for antineoplastic radiation therapy: Secondary | ICD-10-CM | POA: Diagnosis not present

## 2015-08-14 MED ORDER — PROCHLORPERAZINE MALEATE 10 MG PO TABS: 10.0000 mg | ORAL_TABLET | Freq: Four times a day (QID) | ORAL | Status: AC | PRN

## 2015-08-14 NOTE — Telephone Encounter (Signed)
Valerie from [facility] is requesting refill for patient, however it needs to be in Dr. Worthy Flank name in order for medicaid to pay for it. - Done

## 2015-08-15 ENCOUNTER — Ambulatory Visit
Admission: RE | Admit: 2015-08-15 | Discharge: 2015-08-15 | Disposition: A | Discharge status: Home / Self Care | Payer: Medicaid Other | Source: Ambulatory Visit | Attending: Radiation Oncology | Admitting: Radiation Oncology

## 2015-08-15 DIAGNOSIS — Z510 Encounter for antineoplastic radiation therapy: Secondary | ICD-10-CM | POA: Diagnosis not present

## 2015-08-16 ENCOUNTER — Ambulatory Visit
Admission: RE | Admit: 2015-08-16 | Discharge: 2015-08-16 | Disposition: A | Discharge status: Home / Self Care | Payer: Medicaid Other | Source: Ambulatory Visit | Attending: Radiation Oncology | Admitting: Radiation Oncology

## 2015-08-16 ENCOUNTER — Other Ambulatory Visit: Payer: Self-pay | Admitting: *Deleted

## 2015-08-16 DIAGNOSIS — Z510 Encounter for antineoplastic radiation therapy: Secondary | ICD-10-CM | POA: Diagnosis not present

## 2015-08-17 ENCOUNTER — Encounter: Payer: Self-pay | Admitting: Radiation Oncology

## 2015-08-17 ENCOUNTER — Ambulatory Visit
Admission: RE | Admit: 2015-08-17 | Discharge: 2015-08-17 | Disposition: A | Discharge status: Home / Self Care | Payer: Medicaid Other | Source: Ambulatory Visit | Attending: Radiation Oncology | Admitting: Radiation Oncology

## 2015-08-17 VITALS — BP 138/90 | HR 104 | Temp 97.3°F | Resp 20 | Wt 201.2 lb

## 2015-08-17 DIAGNOSIS — Z510 Encounter for antineoplastic radiation therapy: Secondary | ICD-10-CM | POA: Diagnosis not present

## 2015-08-17 DIAGNOSIS — C3431 Malignant neoplasm of lower lobe, right bronchus or lung: Secondary | ICD-10-CM

## 2015-08-17 NOTE — Progress Notes (Addendum)
  Radiation Oncology         [phone] ________________________________  Name: NIKOL LEMAR MRN: [id number]  Date: 07/27/2015  DOB: 11-19-1960  SIMULATION AND TREATMENT PLANNING NOTE  DIAGNOSIS:     ICD-9-CM ICD-10-CM   1. Cancer of lower lobe of right lung ([place]) 162.5 C34.31      Site:  chest  NARRATIVE:  The patient was brought to the [facility].  Identity was confirmed.  All relevant records and images related to the planned course of therapy were reviewed.   Written consent to proceed with treatment was confirmed which was freely given after reviewing the details related to the planned course of therapy had been reviewed with the patient.  Then, the patient was set-up in a stable reproducible  supine position for radiation therapy.  CT images were obtained.  Surface markings were placed.    The CT images were loaded into the planning software.  Then the target and avoidance structures were contoured.  Treatment planning then occurred.  The radiation prescription was entered and confirmed.  A total of 4 complex treatment devices were fabricated which relate to the designed radiation treatment fields. Each of these customized fields/ complex treatment devices will be used on a daily basis during the radiation course. I have requested : 3D Simulation  I have requested a DVH of the following structures: Tumor volume, lungs, esophagus, spinal cord.   The patient will undergo daily image guidance to ensure accurate localization of the target, and adequate minimize dose to the normal surrounding structures in close proximity to the target.   PLAN:  The patient will receive 59.4 Gy in 33 fractions.    Special treatment procedure The patient will also receive concurrent chemotherapy during the treatment. The patient may therefore experience increased toxicity or side effects and the patient will be monitored for such problems. This may require extra lab work as  necessary. This therefore constitutes a special treatment procedure.  ________________________________   Jodelle Gross, MD, PhD

## 2015-08-17 NOTE — Progress Notes (Signed)
Department of Radiation Oncology  Phone:  [phone] Fax:        [phone]  Weekly Treatment Note    Name: Sarah Leonard Date: 08/17/2015 MRN: [id number] DOB: 03/31/1961   Diagnosis:     ICD-9-CM ICD-10-CM   1. Cancer of lower lobe of right lung (HCC) 162.5 C34.31      Current dose: 12.6 Gy  Current fraction: 7   MEDICATIONS: Current Outpatient Prescriptions  Medication Sig Dispense Refill  . acetaminophen (TYLENOL) 325 MG tablet Take 325 mg by mouth 3 (three) times daily. Prior to fish oil    . albuterol (PROVENTIL HFA;VENTOLIN HFA) 108 (90 BASE) MCG/ACT inhaler Inhale 2 puffs into the lungs every 4 (four) hours as needed for wheezing or shortness of breath. 1 Inhaler 0  . aspirin EC 81 MG tablet Take 1 tablet (81 mg total) by mouth daily.    . budesonide-formoterol (SYMBICORT) 80-4.5 MCG/ACT inhaler Inhale 2 puffs into the lungs 2 (two) times daily.    . calcium-vitamin D (OSCAL WITH D) 500-200 MG-UNIT tablet Take 1 tablet by mouth 2 (two) times daily.    . cholecalciferol (VITAMIN D) 1000 UNITS tablet Take 1,000 Units by mouth daily.    . ciprofloxacin (CIPRO) 500 MG tablet Take 1 tablet (500 mg total) by mouth 2 (two) times daily. 10 tablet 0  . docusate sodium (COLACE) 100 MG capsule Take 100 mg by mouth 2 (two) times daily.    Marland Kitchen gabapentin (NEURONTIN) 300 MG capsule Take 600 mg by mouth 3 (three) times daily.    . insulin aspart (NOVOLOG) 100 UNIT/ML injection Inject 5 Units into the skin 3 (three) times daily with meals. 10 mL 0  . insulin glargine (LANTUS) 100 UNIT/ML injection Inject 0.1 mLs (10 Units total) into the skin at bedtime. 10 mL 0  . ipratropium-albuterol (DUONEB) 0.5-2.5 (3) MG/3ML SOLN Take 3 mLs by nebulization every 6 (six) hours as needed. 360 mL 0  . lamoTRIgine (LAMICTAL) 100 MG tablet Take 100 mg by mouth 2 (two) times daily. Take with 25 mg tablet    . lamoTRIgine (LAMICTAL) 25 MG tablet Take 25 mg by mouth 2 (two) times daily. Takes with  '100mg'$  tablet    . lidocaine-prilocaine (EMLA) cream Apply 1 application topically as needed. Apply 1 hr prior to chemotherapy 30 g 1  . LORazepam (ATIVAN) 0.5 MG tablet Take 1 tablet (0.5 mg total) by mouth every 8 (eight) hours as needed for anxiety. 30 tablet 0  . magnesium oxide (MAG-OX) 400 (241.3 MG) MG tablet Take 2 tablets (800 mg total) by mouth 3 (three) times daily. 90 tablet 2  . niacin (NIASPAN) 750 MG CR tablet Take 750 mg by mouth at bedtime.    Marland Kitchen omeprazole (PRILOSEC) 20 MG capsule Take 20 mg by mouth daily before supper.     Marland Kitchen oxyCODONE-acetaminophen (PERCOCET/ROXICET) 5-325 MG tablet Take 1 tablet by mouth every 8 (eight) hours as needed for severe pain. 30 tablet 0  . potassium chloride 20 MEQ TBCR Take 40 mEq by mouth 2 (two) times daily. 60 tablet 0  . prochlorperazine (COMPAZINE) 10 MG tablet Take 1 tablet (10 mg total) by mouth every 6 (six) hours as needed for nausea or vomiting. 30 tablet 0  . promethazine (PHENERGAN) 12.5 MG tablet Take 12.5 mg by mouth every 6 (six) hours as needed for nausea or vomiting.    Marland Kitchen QUEtiapine (SEROQUEL XR) 400 MG 24 hr tablet Take 800 mg by mouth at bedtime.    Marland Kitchen  simvastatin (ZOCOR) 40 MG tablet Take 40 mg by mouth daily at 6 PM.     . sucralfate (CARAFATE) 1 GM/10ML suspension Take 10 mLs (1 g total) by mouth 4 (four) times daily -  with meals and at bedtime. 420 mL 0  . tiotropium (SPIRIVA) 18 MCG inhalation capsule Place 18 mcg into inhaler and inhale daily.    . Wound Dressings (SONAFINE) Apply 1 application topically 2 (two) times daily.    Marland Kitchen zolpidem (AMBIEN) 10 MG tablet Take 0.5 tablets (5 mg total) by mouth at bedtime. 30 tablet 0   No current facility-administered medications for this encounter.     ALLERGIES: Review of patient's allergies indicates no known allergies.   LABORATORY DATA:  Lab Results  Component Value Date   WBC 1.1* 08/09/2015   HGB 11.2* 08/09/2015   HCT 33.0* 08/09/2015   MCV 86.0 08/09/2015   PLT 57*  08/09/2015   Lab Results  Component Value Date   NA 132* 08/09/2015   K 3.1* 08/09/2015   CL 93* 08/09/2015   CO2 28 08/08/2015   Lab Results  Component Value Date   ALT 22 08/08/2015   AST 26 08/08/2015   ALKPHOS 196* 08/08/2015   BILITOT 0.52 08/08/2015     NARRATIVE: Kathlyne D Laban was seen today for weekly treatment management. The chart was checked and the patient's films were reviewed.  Weekly rad txs lung 7/33 completed.  No coughing, no nausea, c/o throat pain with swallowing,  but getting better, upset over losing her hair, pulling strands out as we are talking,   Eating creamed soups, applesauce,  Puddings, suggested glucerna, patient nixed that, no skin changes on lung, using radiaplex bid,  Still gets sleepy a lot, slept well last night except for bad dreams BP 138/90 mmHg  Pulse 104  Temp(Src) 97.3 F (36.3 C) (Oral)  Resp 20  Wt 201 lb 3.2 oz (91.264 kg)  Wt Readings from Last 3 Encounters:  08/17/15 201 lb 3.2 oz (91.264 kg)  08/10/15 201 lb 4.8 oz (91.309 kg)  08/08/15 217 lb 8 oz (98.657 kg)    PHYSICAL EXAMINATION: weight is 201 lb 3.2 oz (91.264 kg). Her oral temperature is 97.3 F (36.3 C). Her blood pressure is 138/90 and her pulse is 104. Her respiration is 20.        ASSESSMENT: The patient is doing satisfactorily with treatment.  PLAN: We will continue with the patient's radiation treatment as planned. The patient is doing satisfactorily at this time. She is concerned about her living situation and she does wish to stay at her current facility. For my perspective this would be fine and we will provide this information to her current facility if there are any concerns about her getting radiation treatment while staying there. They may also have some questions for Dr. Earlie Server as well as the patient is receiving concurrent chemotherapy.

## 2015-08-17 NOTE — Addendum Note (Signed)
Encounter addended by: Kyung Rudd, MD on: 08/17/2015  6:10 PM<BR>     Documentation filed: Notes Section

## 2015-08-17 NOTE — Progress Notes (Signed)
Weekly rad txs lung 7/33 completed.  No coughing, no nausea, c/o throat pain with swallowing,  but getting better, upset over losing her hair, pulling strands out as we are talking,   Eating creamed soups, applesauce,  Puddings, suggested glucerna, patient nixed that, no skin changes on lung, using radiaplex bid,  Still gets sleepy a lot, slept well last night except for bad dreams BP 138/90 mmHg  Pulse 104  Temp(Src) 97.3 F (36.3 C) (Oral)  Resp 20  Wt 201 lb 3.2 oz (91.264 kg)  Wt Readings from Last 3 Encounters:  08/17/15 201 lb 3.2 oz (91.264 kg)  08/10/15 201 lb 4.8 oz (91.309 kg)  08/08/15 217 lb 8 oz (98.657 kg)

## 2015-08-17 NOTE — Addendum Note (Signed)
Encounter addended by: Kyung Rudd, MD on: 08/17/2015  6:08 PM<BR>     Documentation filed: Notes Section, Problem List, Visit Diagnoses

## 2015-08-20 ENCOUNTER — Ambulatory Visit
Admission: RE | Admit: 2015-08-20 | Discharge: 2015-08-20 | Disposition: A | Discharge status: Home / Self Care | Payer: Medicaid Other | Source: Ambulatory Visit | Attending: Radiation Oncology | Admitting: Radiation Oncology

## 2015-08-20 ENCOUNTER — Ambulatory Visit: Payer: Medicaid Other

## 2015-08-21 ENCOUNTER — Other Ambulatory Visit: Payer: Self-pay | Admitting: Medical Oncology

## 2015-08-21 ENCOUNTER — Telehealth: Payer: Self-pay | Admitting: Internal Medicine

## 2015-08-21 ENCOUNTER — Ambulatory Visit (HOSPITAL_BASED_OUTPATIENT_CLINIC_OR_DEPARTMENT_OTHER): Payer: Medicaid Other | Admitting: Internal Medicine

## 2015-08-21 ENCOUNTER — Ambulatory Visit
Admission: RE | Admit: 2015-08-21 | Discharge: 2015-08-21 | Disposition: A | Discharge status: Home / Self Care | Payer: Medicaid Other | Source: Ambulatory Visit | Attending: Radiation Oncology | Admitting: Radiation Oncology

## 2015-08-21 ENCOUNTER — Encounter: Payer: Self-pay | Admitting: Internal Medicine

## 2015-08-21 ENCOUNTER — Other Ambulatory Visit (HOSPITAL_BASED_OUTPATIENT_CLINIC_OR_DEPARTMENT_OTHER): Payer: Medicaid Other

## 2015-08-21 ENCOUNTER — Ambulatory Visit: Payer: Medicaid Other | Admitting: Nutrition

## 2015-08-21 ENCOUNTER — Ambulatory Visit (HOSPITAL_BASED_OUTPATIENT_CLINIC_OR_DEPARTMENT_OTHER): Payer: Medicaid Other

## 2015-08-21 VITALS — BP 136/89 | HR 105 | Temp 98.8°F | Resp 18 | Ht 63.0 in | Wt 198.2 lb

## 2015-08-21 DIAGNOSIS — Z510 Encounter for antineoplastic radiation therapy: Secondary | ICD-10-CM | POA: Diagnosis not present

## 2015-08-21 DIAGNOSIS — C3431 Malignant neoplasm of lower lobe, right bronchus or lung: Secondary | ICD-10-CM

## 2015-08-21 DIAGNOSIS — C3491 Malignant neoplasm of unspecified part of right bronchus or lung: Secondary | ICD-10-CM

## 2015-08-21 DIAGNOSIS — E876 Hypokalemia: Secondary | ICD-10-CM

## 2015-08-21 DIAGNOSIS — E222 Syndrome of inappropriate secretion of antidiuretic hormone: Secondary | ICD-10-CM

## 2015-08-21 DIAGNOSIS — Z5111 Encounter for antineoplastic chemotherapy: Secondary | ICD-10-CM | POA: Diagnosis not present

## 2015-08-21 DIAGNOSIS — E871 Hypo-osmolality and hyponatremia: Secondary | ICD-10-CM

## 2015-08-21 DIAGNOSIS — Z95828 Presence of other vascular implants and grafts: Secondary | ICD-10-CM

## 2015-08-21 LAB — CBC WITH DIFFERENTIAL/PLATELET
BASO%: 0.4 % (ref 0.0–2.0)
BASOS ABS: 0 10*3/uL (ref 0.0–0.1)
EOS%: 0 % (ref 0.0–7.0)
Eosinophils Absolute: 0 10*3/uL (ref 0.0–0.5)
HCT: 32.7 % — ABNORMAL LOW (ref 34.8–46.6)
HGB: 11.1 g/dL — ABNORMAL LOW (ref 11.6–15.9)
LYMPH%: 24 % (ref 14.0–49.7)
MCH: 29.8 pg (ref 25.1–34.0)
MCHC: 33.9 g/dL (ref 31.5–36.0)
MCV: 87.7 fL (ref 79.5–101.0)
MONO#: 0.5 10*3/uL (ref 0.1–0.9)
MONO%: 10.1 % (ref 0.0–14.0)
NEUT#: 3 10*3/uL (ref 1.5–6.5)
NEUT%: 65.5 % (ref 38.4–76.8)
PLATELETS: 447 10*3/uL — AB (ref 145–400)
RBC: 3.73 10*6/uL (ref 3.70–5.45)
RDW: 16.2 % — ABNORMAL HIGH (ref 11.2–14.5)
WBC: 4.6 10*3/uL (ref 3.9–10.3)
lymph#: 1.1 10*3/uL (ref 0.9–3.3)

## 2015-08-21 LAB — COMPREHENSIVE METABOLIC PANEL
ALT: 12 U/L (ref 0–55)
ANION GAP: 12 meq/L — AB (ref 3–11)
AST: 16 U/L (ref 5–34)
Albumin: 2.7 g/dL — ABNORMAL LOW (ref 3.5–5.0)
Alkaline Phosphatase: 141 U/L (ref 40–150)
BUN: 4.2 mg/dL — ABNORMAL LOW (ref 7.0–26.0)
CO2: 23 meq/L (ref 22–29)
Calcium: 8.8 mg/dL (ref 8.4–10.4)
Chloride: 106 mEq/L (ref 98–109)
Creatinine: 0.7 mg/dL (ref 0.6–1.1)
Glucose: 212 mg/dl — ABNORMAL HIGH (ref 70–140)
POTASSIUM: 3.3 meq/L — AB (ref 3.5–5.1)
Sodium: 141 mEq/L (ref 136–145)
TOTAL PROTEIN: 6.2 g/dL — AB (ref 6.4–8.3)

## 2015-08-21 MED ORDER — SODIUM CHLORIDE 0.9 % IV SOLN
Freq: Once | INTRAVENOUS | Status: AC
  Administered 2015-08-21: 10:00:00 via INTRAVENOUS

## 2015-08-21 MED ORDER — SODIUM CHLORIDE 0.9 % IV SOLN
120.0000 mg/m2 | Freq: Once | INTRAVENOUS | Status: AC
  Administered 2015-08-21: 260 mg via INTRAVENOUS
  Filled 2015-08-21: qty 13

## 2015-08-21 MED ORDER — OXYCODONE-ACETAMINOPHEN 5-325 MG PO TABS: 1.0000 | ORAL_TABLET | Freq: Three times a day (TID) | ORAL | Status: DC | PRN

## 2015-08-21 MED ORDER — SODIUM CHLORIDE 0.9 % IV SOLN
750.0000 mg | Freq: Once | INTRAVENOUS | Status: AC
  Administered 2015-08-21: 750 mg via INTRAVENOUS
  Filled 2015-08-21: qty 75

## 2015-08-21 MED ORDER — SODIUM CHLORIDE 0.9 % IJ SOLN
10.0000 mL | INTRAMUSCULAR | Status: DC | PRN
  Administered 2015-08-21: 10 mL
  Filled 2015-08-21: qty 10

## 2015-08-21 MED ORDER — LIDOCAINE-PRILOCAINE 2.5-2.5 % EX CREA: 1.0000 "application " | TOPICAL_CREAM | CUTANEOUS | Status: AC | PRN

## 2015-08-21 MED ORDER — HEPARIN SOD (PORK) LOCK FLUSH 100 UNIT/ML IV SOLN
500.0000 [IU] | Freq: Once | INTRAVENOUS | Status: AC | PRN
  Administered 2015-08-21: 500 [IU]
  Filled 2015-08-21: qty 5

## 2015-08-21 MED ORDER — SODIUM CHLORIDE 0.9 % IV SOLN
Freq: Once | INTRAVENOUS | Status: AC
  Administered 2015-08-21: 10:00:00 via INTRAVENOUS
  Filled 2015-08-21: qty 8

## 2015-08-21 NOTE — Patient Instructions (Signed)
[facility] Discharge Instructions for Patients Receiving Chemotherapy  Today you received the following chemotherapy agents Etoposide/Carboplatin.  To help prevent nausea and vomiting after your treatment, we encourage you to take your nausea medication as prescribed.   If you develop nausea and vomiting that is not controlled by your nausea medication, call the clinic.   BELOW ARE SYMPTOMS THAT SHOULD BE REPORTED IMMEDIATELY:  *FEVER GREATER THAN 100.5 F  *CHILLS WITH OR WITHOUT FEVER  NAUSEA AND VOMITING THAT IS NOT CONTROLLED WITH YOUR NAUSEA MEDICATION  *UNUSUAL SHORTNESS OF BREATH  *UNUSUAL BRUISING OR BLEEDING  TENDERNESS IN MOUTH AND THROAT WITH OR WITHOUT PRESENCE OF ULCERS  *URINARY PROBLEMS  *BOWEL PROBLEMS  UNUSUAL RASH Items with * indicate a potential emergency and should be followed up as soon as possible.  Feel free to call the clinic you have any questions or concerns. The clinic phone number is [phone].  Please show the [facility] at check-in to the Emergency Department and triage nurse.

## 2015-08-21 NOTE — Progress Notes (Signed)
[facility] [phone]   [phone]  OFFICE PROGRESS NOTE  [place], [name] [address]  DIAGNOSIS: Limited stage (T1a, N2, M0) small cell lung cancer presented with right lower lobe pulmonary nodule in addition to mediastinal lymphadenopathy, diagnosed in November 2016   PRIOR THERAPY: None.  CURRENT THERAPY: Systemic chemotherapy in the form of carboplatin for AUC of 5 on day 1 and etoposide 120 MG/M2 on days 1, 2 and 3 with Neulasta support on day 4. Status post one cycle. This is concurrent with radiotherapy under the care of Dr. Tammi Klippel.  INTERVAL HISTORY: Sarah Leonard 55 y.o. female returns to the clinic today for follow-up visit accompanied by her sister Sarah Leonard. The patient tolerated the first cycle of her treatment fairly well with no significant complaints except for mild nausea. She also has some sore throat with radiation treatment. She denied having any significant fever or chills. She has no chest pain but continues to have shortness of breath with exertion with mild cough and no hemoptysis. The patient is here today to start cycle #2 of her systemic chemotherapy.  MEDICAL HISTORY: Past Medical History  Diagnosis Date  . Diabetes mellitus   . Pancreatitis   . Anemia   . Bipolar 1 disorder ([place])   . Alcohol abuse   . 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 COPD (chronic obstructive pulmonary disease) ([place])   . Hypotension   . Major depressive disorder ([place])   . Cocaine abuse   . Chronic back pain   . Renal disorder   . Acidosis   . Hyperkalemia   . Abscess   . Acute encephalopathy   . Septic shock ([place])   . Chemotherapy induced neutropenia ([place]) 08/08/2015    ALLERGIES:  has No Known Allergies.  MEDICATIONS:  Current Outpatient Prescriptions  Medication Sig Dispense Refill  . acetaminophen (TYLENOL) 325 MG tablet Take 325 mg by mouth 3 (three) times daily. Prior to fish oil     . albuterol (PROVENTIL HFA;VENTOLIN HFA) 108 (90 BASE) MCG/ACT inhaler Inhale 2 puffs into the lungs every 4 (four) hours as needed for wheezing or shortness of breath. 1 Inhaler 0  . aspirin EC 81 MG tablet Take 1 tablet (81 mg total) by mouth daily.    . budesonide-formoterol (SYMBICORT) 80-4.5 MCG/ACT inhaler Inhale 2 puffs into the lungs 2 (two) times daily.    . calcium-vitamin D (OSCAL WITH D) 500-200 MG-UNIT tablet Take 1 tablet by mouth 2 (two) times daily.    . cholecalciferol (VITAMIN D) 1000 UNITS tablet Take 1,000 Units by mouth daily.    . ciprofloxacin (CIPRO) 500 MG tablet Take 1 tablet (500 mg total) by mouth 2 (two) times daily. 10 tablet 0  . docusate sodium (COLACE) 100 MG capsule Take 100 mg by mouth 2 (two) times daily.    Marland Kitchen gabapentin (NEURONTIN) 300 MG capsule Take 600 mg by mouth 3 (three) times daily.    . insulin aspart (NOVOLOG) 100 UNIT/ML injection Inject 5 Units into the skin 3 (three) times daily with meals. 10 mL 0  . insulin glargine (LANTUS) 100 UNIT/ML injection Inject 0.1 mLs (10 Units total) into the skin at bedtime. 10 mL 0  . ipratropium-albuterol (DUONEB) 0.5-2.5 (3) MG/3ML SOLN Take 3 mLs by nebulization every 6 (six) hours as needed. 360 mL 0  . lamoTRIgine (LAMICTAL) 100 MG tablet Take 100 mg by mouth 2 (two)  times daily. Take with 25 mg tablet    . lamoTRIgine (LAMICTAL) 25 MG tablet Take 25 mg by mouth 2 (two) times daily. Takes with '100mg'$  tablet    . lidocaine-prilocaine (EMLA) cream Apply 1 application topically as needed. Apply 1 hr prior to chemotherapy 30 g 1  . LORazepam (ATIVAN) 0.5 MG tablet Take 1 tablet (0.5 mg total) by mouth every 8 (eight) hours as needed for anxiety. 30 tablet 0  . magnesium oxide (MAG-OX) 400 (241.3 MG) MG tablet Take 2 tablets (800 mg total) by mouth 3 (three) times daily. 90 tablet 2  . niacin (NIASPAN) 750 MG CR tablet Take 750 mg by mouth at bedtime.    Marland Kitchen omeprazole (PRILOSEC) 20 MG capsule Take 20 mg by mouth daily  before supper.     Marland Kitchen oxyCODONE-acetaminophen (PERCOCET/ROXICET) 5-325 MG tablet Take 1 tablet by mouth every 8 (eight) hours as needed for severe pain. 30 tablet 0  . potassium chloride 20 MEQ TBCR Take 40 mEq by mouth 2 (two) times daily. 60 tablet 0  . prochlorperazine (COMPAZINE) 10 MG tablet Take 1 tablet (10 mg total) by mouth every 6 (six) hours as needed for nausea or vomiting. 30 tablet 0  . promethazine (PHENERGAN) 12.5 MG tablet Take 12.5 mg by mouth every 6 (six) hours as needed for nausea or vomiting.    Marland Kitchen QUEtiapine (SEROQUEL XR) 400 MG 24 hr tablet Take 800 mg by mouth at bedtime.    . simvastatin (ZOCOR) 40 MG tablet Take 40 mg by mouth daily at 6 PM.     . sucralfate (CARAFATE) 1 GM/10ML suspension Take 10 mLs (1 g total) by mouth 4 (four) times daily -  with meals and at bedtime. 420 mL 0  . tiotropium (SPIRIVA) 18 MCG inhalation capsule Place 18 mcg into inhaler and inhale daily.    . Wound Dressings (SONAFINE) Apply 1 application topically 2 (two) times daily.    Marland Kitchen zolpidem (AMBIEN) 10 MG tablet Take 0.5 tablets (5 mg total) by mouth at bedtime. 30 tablet 0   No current facility-administered medications for this visit.    SURGICAL HISTORY:  Past Surgical History  Procedure Laterality Date  . Cholecystectomy    . Appendectomy    . Tonsillectomy    . Incision and drainage abscess Right 03/01/2013    Procedure: INCISION AND DRAINAGE ABSCESS;  Surgeon: Harl Bowie, MD;  Location: [facility];  Service: General;  Laterality: Right;  . Cesarean section    . Video bronchoscopy with endobronchial ultrasound N/A 06/21/2015    Procedure: VIDEO BRONCHOSCOPY WITH ENDOBRONCHIAL ULTRASOUND;  Surgeon: Javier Glazier, MD;  Location: [facility];  Service: Thoracic;  Laterality: N/A;  . Flexible bronchoscopy N/A 06/21/2015    Procedure: BRONCHOSCOPY;  Surgeon: Javier Glazier, MD;  Location: [facility];  Service: Thoracic;  Laterality: N/A;    REVIEW OF SYSTEMS:  Constitutional: positive  for fatigue Eyes: negative Ears, nose, mouth, throat, and face: negative Respiratory: positive for cough and dyspnea on exertion Cardiovascular: negative Gastrointestinal: negative Genitourinary:negative Integument/breast: negative Hematologic/lymphatic: negative Musculoskeletal:negative Neurological: negative Behavioral/Psych: negative Endocrine: negative Allergic/Immunologic: negative   PHYSICAL EXAMINATION: General appearance: alert, cooperative, fatigued and no distress Head: Normocephalic, without obvious abnormality, atraumatic Neck: no adenopathy, no JVD, supple, symmetrical, trachea midline and thyroid not enlarged, symmetric, no tenderness/mass/nodules Lymph nodes: Cervical, supraclavicular, and axillary nodes normal. Resp: clear to auscultation bilaterally Back: symmetric, no curvature. ROM normal. No CVA tenderness. Cardio: regular rate and rhythm, S1, S2 normal,  no murmur, click, rub or gallop GI: soft, non-tender; bowel sounds normal; no masses,  no organomegaly Extremities: extremities normal, atraumatic, no cyanosis or edema Neurologic: Alert and oriented X 3, normal strength and tone. Normal symmetric reflexes. Normal coordination and gait  ECOG PERFORMANCE STATUS: 1 - Symptomatic but completely ambulatory  Blood pressure 136/89, pulse 105, temperature 98.8 F (37.1 C), temperature source Oral, resp. rate 18, height '5\' 3"'$  (1.6 m), weight 198 lb 3.2 oz (89.903 kg), SpO2 100 %.  LABORATORY DATA: Lab Results  Component Value Date   WBC 4.6 08/21/2015   HGB 11.1* 08/21/2015   HCT 32.7* 08/21/2015   MCV 87.7 08/21/2015   PLT 447* 08/21/2015      Chemistry      Component Value Date/Time   NA 132* 08/09/2015 0121   NA 133* 08/08/2015 0815   K 3.1* 08/09/2015 0121   K 3.6 08/08/2015 0815   CL 93* 08/09/2015 0121   CO2 28 08/08/2015 0815   CO2 25 07/25/2015 0639   BUN 4* 08/09/2015 0121   BUN 7.9 08/08/2015 0815   CREATININE 0.50 08/09/2015 0121    CREATININE 0.7 08/08/2015 0815      Component Value Date/Time   CALCIUM 8.9 08/08/2015 0815   CALCIUM 8.2* 07/25/2015 0639   ALKPHOS 196* 08/08/2015 0815   ALKPHOS 104 07/23/2015 0745   AST 26 08/08/2015 0815   AST 37 07/23/2015 0745   ALT 22 08/08/2015 0815   ALT 20 07/23/2015 0745   BILITOT 0.52 08/08/2015 0815   BILITOT 0.7 07/23/2015 0745       RADIOGRAPHIC STUDIES: Dg Neck Soft Tissue  08/09/2015  CLINICAL DATA:  Globus sensation for 2 days. History of lung cancer. EXAM: NECK SOFT TISSUES - 1+ VIEW COMPARISON:  None. FINDINGS: There is no evidence of retropharyngeal soft tissue swelling or epiglottic enlargement. The cervical airway is unremarkable and no radio-opaque foreign body identified. RIGHT tunneled Port-A-Cath via internal jugular venous approach. Moderate degenerative change of the cervical spine without destructive bony lesions. IMPRESSION: Negative. Electronically Signed   By: Elon Alas M.D.   On: 08/09/2015 02:47   Dg Chest 2 View  08/09/2015  CLINICAL DATA:  55 year old female presenting with shortness of breath and filling like something is stuck in her throat EXAM: CHEST  2 VIEW COMPARISON:  Chest radiograph dated 06/21/2015 FINDINGS: Right pectoral Port-A-Cath with tip at the cavoatrial junction. There are bibasilar atelectatic changes/ scarring. There is no focal consolidation, pleural effusion, or pneumothorax. Stable cardiac silhouette. The osseous structures are grossly unremarkable. IMPRESSION: No focal consolidation. Electronically Signed   By: Anner Crete M.D.   On: 08/09/2015 01:42   Ct Head Wo Contrast  07/24/2015  CLINICAL DATA:  Slipped and fell face forward, hitting left side of head. Headache. Initial encounter. EXAM: CT HEAD WITHOUT CONTRAST TECHNIQUE: Contiguous axial images were obtained from the base of the skull through the vertex without intravenous contrast. COMPARISON:  CT of the head performed 07/22/2015 FINDINGS: There is no  evidence of acute infarction, mass lesion, or intra- or extra-axial hemorrhage on CT. The posterior fossa, including the cerebellum, brainstem and fourth ventricle, is within normal limits. The third and lateral ventricles, and basal ganglia are unremarkable in appearance. The cerebral hemispheres are symmetric in appearance, with normal gray-white differentiation. No mass effect or midline shift is seen. There is no evidence of fracture; visualized osseous structures are unremarkable in appearance. The orbits are within normal limits. There is partial opacification of the left maxillary  sinus. There is mild partial opacification of the left mastoid air cells. The remaining paranasal sinuses and right mastoid air cells are well-aerated. No significant soft tissue abnormalities are seen. IMPRESSION: 1. No evidence of traumatic intracranial injury or fracture. 2. Partial opacification of the left maxillary sinus and mild partial opacification of the left mastoid air cells. Electronically Signed   By: Garald Balding M.D.   On: 07/24/2015 04:58    ASSESSMENT AND PLAN: This is a very pleasant 55 years old white female recently diagnosed with limited stage small cell lung cancer and currently undergoing a course of systemic chemotherapy with carboplatin and etoposide in addition to concurrent radiation. She status post 1 cycle of chemotherapy and tolerated the first cycle of her treatment well. I recommended for the patient to proceed with cycle #2 today as scheduled. She will have repeat CT scan of the chest performed and 3 weeks before cycle #3. For the hyponatremia, this significantly improved after the first cycle of her treatment. For the hypokalemia, the patient was advised to increase her potassium rich diet. For pain management, she was giving a refill of Percocet today. The patient would come back for follow-up visit in 3 weeks for reevaluation before starting the next cycle of her treatment. She was  advised to call immediately if she has any concerning symptoms in the interval. The patient voices understanding of current disease status and treatment options and is in agreement with the current care plan.  All questions were answered. The patient knows to call the clinic with any problems, questions or concerns. We can certainly see the patient much sooner if necessary.  Disclaimer: This note was dictated with voice recognition software. Similar sounding words can inadvertently be transcribed and may not be corrected upon review.

## 2015-08-21 NOTE — Progress Notes (Signed)
Nutrition follow-up completed with patient receiving chemotherapy for limited stage small cell lung cancer. Patient states she is pleased with recent weight loss. Weight is documented as 198.2 pounds on January 10, down from 214 pounds December 20. She reports she has nausea every day but it has improved when she takes medication. Patient denies vomiting. She complains of swallowing issues and states she has had liquid stools yesterday and today. Patient continues to live at an assisted-living home and still does not care for the food. She is trying to eat softer foods.  Nutrition diagnosis: Unintended weight loss continues.  Intervention:  I educated patient on increased fatigue and decreased immune system with continued weight loss. Encouraged patient to maintain current weight by increasing overall calories and protein frequently throughout the day. Patient is refusing oral nutrition supplements and states she does not like them so I've encouraged her to find alternatives No family was present but will continue to follow patient as needed.  Monitoring, evaluation, goals: Patient will work to increase calories and protein to promote weight maintenance.  Next visit: Tuesday, January 31 during infusion.  **Disclaimer: This note was dictated with voice recognition software. Similar sounding words can inadvertently be transcribed and this note may contain transcription errors which may not have been corrected upon publication of note.**

## 2015-08-21 NOTE — Telephone Encounter (Signed)
Talked to patient here in office. Scheduled appt.       [facility]. °

## 2015-08-22 ENCOUNTER — Ambulatory Visit
Admission: RE | Admit: 2015-08-22 | Discharge: 2015-08-22 | Disposition: A | Discharge status: Home / Self Care | Payer: Medicaid Other | Source: Ambulatory Visit | Attending: Radiation Oncology | Admitting: Radiation Oncology

## 2015-08-22 ENCOUNTER — Ambulatory Visit (HOSPITAL_BASED_OUTPATIENT_CLINIC_OR_DEPARTMENT_OTHER): Payer: Medicaid Other

## 2015-08-22 VITALS — BP 127/72 | HR 100 | Temp 98.2°F | Resp 18

## 2015-08-22 DIAGNOSIS — Z5111 Encounter for antineoplastic chemotherapy: Secondary | ICD-10-CM

## 2015-08-22 DIAGNOSIS — C3431 Malignant neoplasm of lower lobe, right bronchus or lung: Secondary | ICD-10-CM

## 2015-08-22 DIAGNOSIS — Z510 Encounter for antineoplastic radiation therapy: Secondary | ICD-10-CM | POA: Diagnosis not present

## 2015-08-22 MED ORDER — HEPARIN SOD (PORK) LOCK FLUSH 100 UNIT/ML IV SOLN
500.0000 [IU] | Freq: Once | INTRAVENOUS | Status: AC | PRN
  Administered 2015-08-22: 500 [IU]
  Filled 2015-08-22: qty 5

## 2015-08-22 MED ORDER — SODIUM CHLORIDE 0.9 % IV SOLN
120.0000 mg/m2 | Freq: Once | INTRAVENOUS | Status: AC
  Administered 2015-08-22: 260 mg via INTRAVENOUS
  Filled 2015-08-22: qty 13

## 2015-08-22 MED ORDER — SODIUM CHLORIDE 0.9 % IJ SOLN
10.0000 mL | INTRAMUSCULAR | Status: DC | PRN
  Filled 2015-08-22: qty 10

## 2015-08-22 MED ORDER — SODIUM CHLORIDE 0.9 % IV SOLN
Freq: Once | INTRAVENOUS | Status: AC
  Administered 2015-08-22: 13:00:00 via INTRAVENOUS

## 2015-08-22 MED ORDER — SODIUM CHLORIDE 0.9 % IV SOLN
Freq: Once | INTRAVENOUS | Status: AC
  Administered 2015-08-22: 13:00:00 via INTRAVENOUS
  Filled 2015-08-22: qty 4

## 2015-08-22 NOTE — Patient Instructions (Signed)
[facility] Discharge Instructions for Patients Receiving Chemotherapy  Today you received the following chemotherapy agents etoposide   To help prevent nausea and vomiting after your treatment, we encourage you to take your nausea medication as directed  If you develop nausea and vomiting that is not controlled by your nausea medication, call the clinic.   BELOW ARE SYMPTOMS THAT SHOULD BE REPORTED IMMEDIATELY:  *FEVER GREATER THAN 100.5 F  *CHILLS WITH OR WITHOUT FEVER  NAUSEA AND VOMITING THAT IS NOT CONTROLLED WITH YOUR NAUSEA MEDICATION  *UNUSUAL SHORTNESS OF BREATH  *UNUSUAL BRUISING OR BLEEDING  TENDERNESS IN MOUTH AND THROAT WITH OR WITHOUT PRESENCE OF ULCERS  *URINARY PROBLEMS  *BOWEL PROBLEMS  UNUSUAL RASH Items with * indicate a potential emergency and should be followed up as soon as possible.  Feel free to call the clinic you have any questions or concerns. The clinic phone number is [phone].  

## 2015-08-23 ENCOUNTER — Ambulatory Visit: Admission: RE | Admit: 2015-08-23 | Payer: Medicaid Other | Source: Ambulatory Visit

## 2015-08-23 ENCOUNTER — Ambulatory Visit (HOSPITAL_BASED_OUTPATIENT_CLINIC_OR_DEPARTMENT_OTHER): Payer: Medicaid Other

## 2015-08-23 ENCOUNTER — Ambulatory Visit
Admission: RE | Admit: 2015-08-23 | Discharge: 2015-08-23 | Disposition: A | Discharge status: Home / Self Care | Payer: Medicaid Other | Source: Ambulatory Visit | Attending: Radiation Oncology | Admitting: Radiation Oncology

## 2015-08-23 VITALS — BP 148/84 | HR 110 | Temp 97.6°F | Resp 16

## 2015-08-23 DIAGNOSIS — C3431 Malignant neoplasm of lower lobe, right bronchus or lung: Secondary | ICD-10-CM

## 2015-08-23 DIAGNOSIS — Z5111 Encounter for antineoplastic chemotherapy: Secondary | ICD-10-CM

## 2015-08-23 MED ORDER — SODIUM CHLORIDE 0.9 % IV SOLN
Freq: Once | INTRAVENOUS | Status: AC
  Administered 2015-08-23: 15:00:00 via INTRAVENOUS

## 2015-08-23 MED ORDER — SODIUM CHLORIDE 0.9 % IV SOLN
Freq: Once | INTRAVENOUS | Status: AC
  Administered 2015-08-23: 15:00:00 via INTRAVENOUS
  Filled 2015-08-23: qty 4

## 2015-08-23 MED ORDER — SODIUM CHLORIDE 0.9 % IV SOLN
120.0000 mg/m2 | Freq: Once | INTRAVENOUS | Status: AC
  Administered 2015-08-23: 260 mg via INTRAVENOUS
  Filled 2015-08-23: qty 13

## 2015-08-23 MED ORDER — HEPARIN SOD (PORK) LOCK FLUSH 100 UNIT/ML IV SOLN
500.0000 [IU] | Freq: Once | INTRAVENOUS | Status: AC | PRN
  Administered 2015-08-23: 500 [IU]
  Filled 2015-08-23: qty 5

## 2015-08-23 MED ORDER — SODIUM CHLORIDE 0.9 % IJ SOLN
10.0000 mL | INTRAMUSCULAR | Status: DC | PRN
  Administered 2015-08-23: 10 mL
  Filled 2015-08-23: qty 10

## 2015-08-23 NOTE — Patient Instructions (Signed)
Etoposide, VP-16 capsules What is this medicine? ETOPOSIDE, VP-16 (e toe POE side) is a chemotherapy drug. It is used to treat small cell lung cancer and other cancers. This medicine may be used for other purposes; ask your health care provider or pharmacist if you have questions. What should I tell my health care provider before I take this medicine? They need to know if you have any of these conditions: -infection -kidney disease -low blood counts, like low white cell, platelet, or red cell counts -an unusual or allergic reaction to etoposide, other chemotherapeutic agents, other medicines, foods, dyes, or preservatives -pregnant or trying to get pregnant -breast-feeding How should I use this medicine? Take this medicine by mouth with a glass of water. Follow the directions on the prescription label. Do not open, crush, or chew the capsules. It is advisable to wear gloves when handling this medicine. Take your medicine at regular intervals. Do not take it more often than directed. Do not stop taking except on your doctor's advice. Talk to your pediatrician regarding the use of this medicine in children. Special care may be needed. Overdosage: If you think you have taken too much of this medicine contact a poison control center or emergency room at once. NOTE: This medicine is only for you. Do not share this medicine with others. What if I miss a dose? If you miss a dose, take it as soon as you can. If it is almost time for your next dose, take only that dose. Do not take double or extra doses. What may interact with this medicine? -aspirin -certain medications for seizures like carbamazepine, phenobarbital, phenytoin, valproic acid -cyclosporine -levamisole -valproic acid -warfarin This list may not describe all possible interactions. Give your health care provider a list of all the medicines, herbs, non-prescription drugs, or dietary supplements you use. Also tell them if you smoke,  drink alcohol, or use illegal drugs. Some items may interact with your medicine. What should I watch for while using this medicine? Visit your doctor for checks on your progress. This drug may make you feel generally unwell. This is not uncommon, as chemotherapy can affect healthy cells as well as cancer cells. Report any side effects. Continue your course of treatment even though you feel ill unless your doctor tells you to stop. In some cases, you may be given additional medicines to help with side effects. Follow all directions for their use. Call your doctor or health care professional for advice if you get a fever, chills or sore throat, or other symptoms of a cold or flu. Do not treat yourself. This drug decreases your body's ability to fight infections. Try to avoid being around people who are sick. This medicine may increase your risk to bruise or bleed. Call your doctor or health care professional if you notice any unusual bleeding. Be careful brushing and flossing your teeth or using a toothpick because you may get an infection or bleed more easily. If you have any dental work done, tell your dentist you are receiving this medicine. Avoid taking products that contain aspirin, acetaminophen, ibuprofen, naproxen, or ketoprofen unless instructed by your doctor. These medicines may hide a fever. Do not become pregnant while taking this medicine or for at least 6 months after stopping it. Women should inform their doctor if they wish to become pregnant or think they might be pregnant. Women of child-bearing potential will need to have a negative pregnancy test before starting this medicine. There is a potential for serious  side effects to an unborn child. Talk to your health care professional or pharmacist for more information. Do not breast-feed an infant while taking this medicine. Men must use a latex condom during sexual contact with a woman while taking this medicine and for at least 4 months  after stopping it. A latex condom is needed even if you have had a vasectomy. Contact your doctor right away if your partner becomes pregnant. Do not donate sperm while taking this medicine and for 4 months after you stop taking this medicine. Men should inform their doctors if they wish to father a child. This medicine may lower sperm counts. What side effects may I notice from receiving this medicine? Side effects that you should report to your doctor or health care professional as soon as possible: -allergic reactions like skin rash, itching or hives, swelling of the face, lips, or tongue -low blood counts - this medicine may decrease the number of white blood cells, red blood cells and platelets. You may be at increased risk for infections and bleeding. -signs of infection - fever or chills, cough, sore throat, pain or difficulty passing urine -signs of decreased platelets or bleeding - bruising, pinpoint red spots on the skin, black, tarry stools, blood in the urine -signs of decreased red blood cells - unusually weak or tired, fainting spells, lightheadedness -breathing problems -changes in vision -mouth or throat sores or ulcers -pain, tingling, numbness in the hands or feet -redness, blistering, peeling or loosening of the skin, including inside the mouth -seizures -vomiting Side effects that usually do not require medical attention (report to your doctor or health care professional if they continue or are bothersome): -change in taste -diarrhea -hair loss -nausea -stomach pain This list may not describe all possible side effects. Call your doctor for medical advice about side effects. You may report side effects to FDA at 1-800-FDA-1088. Where should I keep my medicine? Keep out of the reach of children. Store in a refrigerator between 2 and 8 degrees C (36 and 46 degrees F). Do not freeze. Throw away any unused medicine after the expiration date. NOTE: This sheet is a summary. It  may not cover all possible information. If you have questions about this medicine, talk to your doctor, pharmacist, or health care provider.    2016, [employer]. (2014-03-23 46:96:29)

## 2015-08-24 ENCOUNTER — Ambulatory Visit
Admission: RE | Admit: 2015-08-24 | Discharge: 2015-08-24 | Disposition: A | Discharge status: Home / Self Care | Payer: Medicaid Other | Source: Ambulatory Visit | Attending: Radiation Oncology | Admitting: Radiation Oncology

## 2015-08-24 ENCOUNTER — Encounter: Payer: Self-pay | Admitting: Radiation Oncology

## 2015-08-24 VITALS — BP 129/82 | HR 118 | Temp 97.8°F | Resp 20 | Wt 204.6 lb

## 2015-08-24 DIAGNOSIS — Z510 Encounter for antineoplastic radiation therapy: Secondary | ICD-10-CM | POA: Diagnosis not present

## 2015-08-24 DIAGNOSIS — C3431 Malignant neoplasm of lower lobe, right bronchus or lung: Secondary | ICD-10-CM

## 2015-08-24 NOTE — Progress Notes (Signed)
weekly rad txs  Lung 10/33 compleetd,  Saw Sarah Leonard and Dr. Julien Nordmann 08/21/15, getting carboplatin/etoposide infusions,  and percocet helping her chest/throat pain, takes carafte also, no skin changes, not using sonafine cream as yet, nausea better when she takes her nasuea medicattion ans ge is  eating softer foods, energy level poor, sleepy 3:02 PM  3:01 PM .

## 2015-08-24 NOTE — Progress Notes (Signed)
Department of Radiation Oncology  Phone:  [phone] Fax:        [phone]  Weekly Treatment Note    Name: Sarah Leonard Date: 08/24/2015 MRN: [id number] DOB: 1961-02-29   Diagnosis:     ICD-9-CM ICD-10-CM   1. Cancer of lower lobe of right lung (HCC) 162.5 C34.31      Current dose: 18 Gy  Current fraction: 10   MEDICATIONS: Current Outpatient Prescriptions  Medication Sig Dispense Refill  . acetaminophen (TYLENOL) 325 MG tablet Take 325 mg by mouth 3 (three) times daily. Prior to fish oil    . albuterol (PROVENTIL HFA;VENTOLIN HFA) 108 (90 BASE) MCG/ACT inhaler Inhale 2 puffs into the lungs every 4 (four) hours as needed for wheezing or shortness of breath. 1 Inhaler 0  . aspirin EC 81 MG tablet Take 1 tablet (81 mg total) by mouth daily.    . budesonide-formoterol (SYMBICORT) 80-4.5 MCG/ACT inhaler Inhale 2 puffs into the lungs 2 (two) times daily.    . calcium-vitamin D (OSCAL WITH D) 500-200 MG-UNIT tablet Take 1 tablet by mouth 2 (two) times daily.    . cholecalciferol (VITAMIN D) 1000 UNITS tablet Take 1,000 Units by mouth daily.    Marland Kitchen docusate sodium (COLACE) 100 MG capsule Take 100 mg by mouth 2 (two) times daily.    Marland Kitchen gabapentin (NEURONTIN) 300 MG capsule Take 600 mg by mouth 3 (three) times daily.    . insulin aspart (NOVOLOG) 100 UNIT/ML injection Inject 5 Units into the skin 3 (three) times daily with meals. 10 mL 0  . insulin glargine (LANTUS) 100 UNIT/ML injection Inject 0.1 mLs (10 Units total) into the skin at bedtime. 10 mL 0  . ipratropium-albuterol (DUONEB) 0.5-2.5 (3) MG/3ML SOLN Take 3 mLs by nebulization every 6 (six) hours as needed. 360 mL 0  . lamoTRIgine (LAMICTAL) 100 MG tablet Take 100 mg by mouth 2 (two) times daily. Take with 25 mg tablet    . lamoTRIgine (LAMICTAL) 25 MG tablet Take 25 mg by mouth 2 (two) times daily. Takes with '100mg'$  tablet    . lidocaine-prilocaine (EMLA) cream Apply 1 application topically as needed. Apply 1 hr prior  to chemotherapy 30 g 1  . lidocaine-prilocaine (EMLA) cream Apply 1 application topically as needed. Apply to port site 1-1.5 hours prior to chemotherapy appt 30 g 0  . LORazepam (ATIVAN) 0.5 MG tablet Take 1 tablet (0.5 mg total) by mouth every 8 (eight) hours as needed for anxiety. 30 tablet 0  . magnesium oxide (MAG-OX) 400 (241.3 MG) MG tablet Take 2 tablets (800 mg total) by mouth 3 (three) times daily. 90 tablet 2  . niacin (NIASPAN) 750 MG CR tablet Take 750 mg by mouth at bedtime.    Marland Kitchen omeprazole (PRILOSEC) 20 MG capsule Take 20 mg by mouth daily before supper.     Marland Kitchen oxyCODONE-acetaminophen (PERCOCET/ROXICET) 5-325 MG tablet Take 1 tablet by mouth every 8 (eight) hours as needed for severe pain. 30 tablet 0  . potassium chloride 20 MEQ TBCR Take 40 mEq by mouth 2 (two) times daily. 60 tablet 0  . prochlorperazine (COMPAZINE) 10 MG tablet Take 1 tablet (10 mg total) by mouth every 6 (six) hours as needed for nausea or vomiting. 30 tablet 0  . promethazine (PHENERGAN) 12.5 MG tablet Take 12.5 mg by mouth every 6 (six) hours as needed for nausea or vomiting.    Marland Kitchen QUEtiapine (SEROQUEL XR) 400 MG 24 hr tablet Take 800 mg by mouth at  bedtime.    . simvastatin (ZOCOR) 40 MG tablet Take 40 mg by mouth daily at 6 PM.     . sucralfate (CARAFATE) 1 GM/10ML suspension Take 10 mLs (1 g total) by mouth 4 (four) times daily -  with meals and at bedtime. 420 mL 0  . tiotropium (SPIRIVA) 18 MCG inhalation capsule Place 18 mcg into inhaler and inhale daily.    . Wound Dressings (SONAFINE) Apply 1 application topically 2 (two) times daily.    Marland Kitchen zolpidem (AMBIEN) 10 MG tablet Take 0.5 tablets (5 mg total) by mouth at bedtime. 30 tablet 0   No current facility-administered medications for this encounter.     ALLERGIES: Review of patient's allergies indicates no known allergies.   LABORATORY DATA:  Lab Results  Component Value Date   WBC 4.6 08/21/2015   HGB 11.1* 08/21/2015   HCT 32.7* 08/21/2015    MCV 87.7 08/21/2015   PLT 447* 08/21/2015   Lab Results  Component Value Date   NA 141 08/21/2015   K 3.3* 08/21/2015   CL 93* 08/09/2015   CO2 23 08/21/2015   Lab Results  Component Value Date   ALT 12 08/21/2015   AST 16 08/21/2015   ALKPHOS 141 08/21/2015   BILITOT <0.30 08/21/2015     NARRATIVE: Sarah Leonard was seen today for weekly treatment management. The chart was checked and the patient's films were reviewed.  weekly rad txs  Lung 10/33 compleetd,  Saw Ernestene Kiel and Dr. Julien Nordmann 08/21/15, getting carboplatin/etoposide infusions,  and percocet helping her chest/throat pain, takes carafte also, no skin changes, not using sonafine cream as yet, nausea better when she takes her nasuea medicattion ans ge is  eating softer foods, energy level poor, sleepy 5:08 PM  5:08 PM .  PHYSICAL EXAMINATION: weight is 204 lb 9.6 oz (92.806 kg). Her oral temperature is 97.8 F (36.6 C). Her blood pressure is 129/82 and her pulse is 118. Her respiration is 20 and oxygen saturation is 99%.        ASSESSMENT: The patient is doing satisfactorily with treatment.  PLAN: We will continue with the patient's radiation treatment as planned. Patient is doing very well currently.

## 2015-08-25 ENCOUNTER — Ambulatory Visit (HOSPITAL_BASED_OUTPATIENT_CLINIC_OR_DEPARTMENT_OTHER): Payer: Medicaid Other

## 2015-08-25 VITALS — BP 133/79 | HR 110 | Temp 98.8°F | Resp 20

## 2015-08-25 DIAGNOSIS — Z5189 Encounter for other specified aftercare: Secondary | ICD-10-CM | POA: Diagnosis not present

## 2015-08-25 DIAGNOSIS — C3431 Malignant neoplasm of lower lobe, right bronchus or lung: Secondary | ICD-10-CM

## 2015-08-25 MED ORDER — PEGFILGRASTIM INJECTION 6 MG/0.6ML ~~LOC~~
6.0000 mg | PREFILLED_SYRINGE | Freq: Once | SUBCUTANEOUS | Status: AC
  Administered 2015-08-25: 6 mg via SUBCUTANEOUS

## 2015-08-25 NOTE — Patient Instructions (Signed)
Pegfilgrastim injection What is this medicine? PEGFILGRASTIM (PEG fil gra stim) is a long-acting granulocyte colony-stimulating factor that stimulates the growth of neutrophils, a type of white blood cell important in the body's fight against infection. It is used to reduce the incidence of fever and infection in patients with certain types of cancer who are receiving chemotherapy that affects the bone marrow, and to increase survival after being exposed to high doses of radiation. This medicine may be used for other purposes; ask your health care provider or pharmacist if you have questions. What should I tell my health care provider before I take this medicine? They need to know if you have any of these conditions: -kidney disease -latex allergy -ongoing radiation therapy -sickle cell disease -skin reactions to acrylic adhesives (On-Body Injector only) -an unusual or allergic reaction to pegfilgrastim, filgrastim, other medicines, foods, dyes, or preservatives -pregnant or trying to get pregnant -breast-feeding How should I use this medicine? This medicine is for injection under the skin. If you get this medicine at home, you will be taught how to prepare and give the pre-filled syringe or how to use the On-body Injector. Refer to the patient Instructions for Use for detailed instructions. Use exactly as directed. Take your medicine at regular intervals. Do not take your medicine more often than directed. It is important that you put your used needles and syringes in a special sharps container. Do not put them in a trash can. If you do not have a sharps container, call your pharmacist or healthcare provider to get one. Talk to your pediatrician regarding the use of this medicine in children. While this drug may be prescribed for selected conditions, precautions do apply. Overdosage: If you think you have taken too much of this medicine contact a poison control center or emergency room at  once. NOTE: This medicine is only for you. Do not share this medicine with others. What if I miss a dose? It is important not to miss your dose. Call your doctor or health care professional if you miss your dose. If you miss a dose due to an On-body Injector failure or leakage, a new dose should be administered as soon as possible using a single prefilled syringe for manual use. What may interact with this medicine? Interactions have not been studied. Give your health care provider a list of all the medicines, herbs, non-prescription drugs, or dietary supplements you use. Also tell them if you smoke, drink alcohol, or use illegal drugs. Some items may interact with your medicine. This list may not describe all possible interactions. Give your health care provider a list of all the medicines, herbs, non-prescription drugs, or dietary supplements you use. Also tell them if you smoke, drink alcohol, or use illegal drugs. Some items may interact with your medicine. What should I watch for while using this medicine? You may need blood work done while you are taking this medicine. If you are going to need a MRI, CT scan, or other procedure, tell your doctor that you are using this medicine (On-Body Injector only). What side effects may I notice from receiving this medicine? Side effects that you should report to your doctor or health care professional as soon as possible: -allergic reactions like skin rash, itching or hives, swelling of the face, lips, or tongue -dizziness -fever -pain, redness, or irritation at site where injected -pinpoint red spots on the skin -red or dark-brown urine -shortness of breath or breathing problems -stomach or side pain, or pain   at the shoulder -swelling -tiredness -trouble passing urine or change in the amount of urine Side effects that usually do not require medical attention (report to your doctor or health care professional if they continue or are  bothersome): -bone pain -muscle pain This list may not describe all possible side effects. Call your doctor for medical advice about side effects. You may report side effects to FDA at 1-800-FDA-1088. Where should I keep my medicine? Keep out of the reach of children. Store pre-filled syringes in a refrigerator between 2 and 8 degrees C (36 and 46 degrees F). Do not freeze. Keep in carton to protect from light. Throw away this medicine if it is left out of the refrigerator for more than 48 hours. Throw away any unused medicine after the expiration date. NOTE: This sheet is a summary. It may not cover all possible information. If you have questions about this medicine, talk to your doctor, pharmacist, or health care provider.    2016, [employer]. (2014-08-17 14:30:14)  

## 2015-08-27 ENCOUNTER — Ambulatory Visit: Payer: Medicaid Other

## 2015-08-28 ENCOUNTER — Ambulatory Visit
Admission: RE | Admit: 2015-08-28 | Discharge: 2015-08-28 | Disposition: A | Discharge status: Home / Self Care | Payer: Medicaid Other | Source: Ambulatory Visit | Attending: Radiation Oncology | Admitting: Radiation Oncology

## 2015-08-28 DIAGNOSIS — Z510 Encounter for antineoplastic radiation therapy: Secondary | ICD-10-CM | POA: Diagnosis not present

## 2015-08-29 ENCOUNTER — Ambulatory Visit
Admission: RE | Admit: 2015-08-29 | Discharge: 2015-08-29 | Disposition: A | Discharge status: Home / Self Care | Payer: Medicaid Other | Source: Ambulatory Visit | Attending: Radiation Oncology | Admitting: Radiation Oncology

## 2015-08-29 ENCOUNTER — Other Ambulatory Visit: Payer: Self-pay | Admitting: Radiation Oncology

## 2015-08-29 ENCOUNTER — Telehealth: Payer: Self-pay | Admitting: *Deleted

## 2015-08-29 DIAGNOSIS — Z510 Encounter for antineoplastic radiation therapy: Secondary | ICD-10-CM | POA: Diagnosis not present

## 2015-08-29 MED ORDER — SUCRALFATE 1 GM/10ML PO SUSP: 1.0000 g | Freq: Three times a day (TID) | ORAL | Status: AC

## 2015-08-29 MED ORDER — SUCRALFATE 1 GM/10ML PO SUSP: 1.0000 g | Freq: Three times a day (TID) | ORAL | Status: DC

## 2015-08-29 NOTE — Telephone Encounter (Addendum)
Patient sister called and left voice message  stating" Sarah Leonard is out of her Carafate, her throat is hurting more,  RX  last written 08/09/15 ,she resides at a assisted living, and is requesting refill  RX of carafate to be faxed to that facility at [phone] called Patty Rhodes,sister back and left voice message, that patient is currently getting radiation daily and we could give rx to her today when she comes for radiation, if  this will not do call me back at [phone] 10:55 AM Sister called back and requested that we give the patient a printed RX for the Carafate today and that would be great, 12:10 PM

## 2015-08-29 NOTE — Telephone Encounter (Signed)
error 

## 2015-08-30 ENCOUNTER — Ambulatory Visit
Admission: RE | Admit: 2015-08-30 | Discharge: 2015-08-30 | Disposition: A | Discharge status: Home / Self Care | Payer: Medicaid Other | Source: Ambulatory Visit | Attending: Radiation Oncology | Admitting: Radiation Oncology

## 2015-08-30 ENCOUNTER — Encounter: Payer: Self-pay | Admitting: Radiation Oncology

## 2015-08-30 DIAGNOSIS — Z510 Encounter for antineoplastic radiation therapy: Secondary | ICD-10-CM | POA: Diagnosis not present

## 2015-08-31 ENCOUNTER — Ambulatory Visit
Admission: RE | Admit: 2015-08-31 | Discharge: 2015-08-31 | Disposition: A | Discharge status: Home / Self Care | Payer: Medicaid Other | Source: Ambulatory Visit | Attending: Radiation Oncology | Admitting: Radiation Oncology

## 2015-08-31 ENCOUNTER — Telehealth: Payer: Self-pay | Admitting: Radiation Oncology

## 2015-08-31 ENCOUNTER — Encounter: Payer: Self-pay | Admitting: Radiation Oncology

## 2015-08-31 VITALS — BP 129/100 | HR 100 | Temp 97.8°F | Resp 20 | Wt 189.0 lb

## 2015-08-31 DIAGNOSIS — Z510 Encounter for antineoplastic radiation therapy: Secondary | ICD-10-CM | POA: Diagnosis not present

## 2015-08-31 DIAGNOSIS — C3431 Malignant neoplasm of lower lobe, right bronchus or lung: Secondary | ICD-10-CM

## 2015-08-31 MED ORDER — ONDANSETRON HCL 4 MG PO TABS
4.0000 mg | ORAL_TABLET | Freq: Once | ORAL | Status: AC
  Administered 2015-08-31: 4 mg via ORAL
  Filled 2015-08-31: qty 1

## 2015-08-31 MED ORDER — OXYCODONE-ACETAMINOPHEN 5-325 MG PO TABS
1.0000 | ORAL_TABLET | Freq: Once | ORAL | Status: AC
  Administered 2015-08-31: 1 via ORAL
  Filled 2015-08-31: qty 1

## 2015-08-31 NOTE — Progress Notes (Signed)
Department of Radiation Oncology  Phone:  [phone] Fax:        [phone]  Weekly Treatment Note    Name: Sarah Leonard Date: 08/31/2015 MRN: [id number] DOB: [birth year]   Diagnosis:     ICD-9-CM ICD-10-CM   1. Cancer of lower lobe of right lung (HCC) 162.5 C34.31 oxyCODONE-acetaminophen (PERCOCET/ROXICET) 5-325 MG per tablet 1 tablet     ondansetron (ZOFRAN) tablet 4 mg     Current dose: 25.2 Gy  Current fraction: 14   MEDICATIONS: Current Outpatient Prescriptions  Medication Sig Dispense Refill  . acetaminophen (TYLENOL) 325 MG tablet Take 325 mg by mouth 3 (three) times daily. Prior to fish oil    . albuterol (PROVENTIL HFA;VENTOLIN HFA) 108 (90 BASE) MCG/ACT inhaler Inhale 2 puffs into the lungs every 4 (four) hours as needed for wheezing or shortness of breath. 1 Inhaler 0  . budesonide-formoterol (SYMBICORT) 80-4.5 MCG/ACT inhaler Inhale 2 puffs into the lungs 2 (two) times daily.    . calcium-vitamin D (OSCAL WITH D) 500-200 MG-UNIT tablet Take 1 tablet by mouth 2 (two) times daily.    . cholecalciferol (VITAMIN D) 1000 UNITS tablet Take 1,000 Units by mouth daily.    Marland Kitchen docusate sodium (COLACE) 100 MG capsule Take 100 mg by mouth 2 (two) times daily.    Marland Kitchen gabapentin (NEURONTIN) 300 MG capsule Take 600 mg by mouth 3 (three) times daily.    . insulin aspart (NOVOLOG) 100 UNIT/ML injection Inject 5 Units into the skin 3 (three) times daily with meals. 10 mL 0  . insulin glargine (LANTUS) 100 UNIT/ML injection Inject 0.1 mLs (10 Units total) into the skin at bedtime. 10 mL 0  . ipratropium-albuterol (DUONEB) 0.5-2.5 (3) MG/3ML SOLN Take 3 mLs by nebulization every 6 (six) hours as needed. 360 mL 0  . lamoTRIgine (LAMICTAL) 100 MG tablet Take 100 mg by mouth 2 (two) times daily. Take with 25 mg tablet    . lamoTRIgine (LAMICTAL) 25 MG tablet Take 25 mg by mouth 2 (two) times daily. Takes with '100mg'$  tablet    . lidocaine-prilocaine (EMLA) cream Apply 1 application  topically as needed. Apply 1 hr prior to chemotherapy 30 g 1  . lidocaine-prilocaine (EMLA) cream Apply 1 application topically as needed. Apply to port site 1-1.5 hours prior to chemotherapy appt 30 g 0  . LORazepam (ATIVAN) 0.5 MG tablet Take 1 tablet (0.5 mg total) by mouth every 8 (eight) hours as needed for anxiety. 30 tablet 0  . magnesium oxide (MAG-OX) 400 (241.3 MG) MG tablet Take 2 tablets (800 mg total) by mouth 3 (three) times daily. 90 tablet 2  . niacin (NIASPAN) 750 MG CR tablet Take 750 mg by mouth at bedtime.    Marland Kitchen omeprazole (PRILOSEC) 20 MG capsule Take 20 mg by mouth daily before supper.     Marland Kitchen oxyCODONE-acetaminophen (PERCOCET/ROXICET) 5-325 MG tablet Take 1 tablet by mouth every 8 (eight) hours as needed for severe pain. 30 tablet 0  . potassium chloride 20 MEQ TBCR Take 40 mEq by mouth 2 (two) times daily. 60 tablet 0  . prochlorperazine (COMPAZINE) 10 MG tablet Take 1 tablet (10 mg total) by mouth every 6 (six) hours as needed for nausea or vomiting. 30 tablet 0  . promethazine (PHENERGAN) 12.5 MG tablet Take 12.5 mg by mouth every 6 (six) hours as needed for nausea or vomiting.    Marland Kitchen QUEtiapine (SEROQUEL XR) 400 MG 24 hr tablet Take 800 mg by mouth at bedtime.    Marland Kitchen  simvastatin (ZOCOR) 40 MG tablet Take 40 mg by mouth daily at 6 PM.     . sucralfate (CARAFATE) 1 GM/10ML suspension Take 10 mLs (1 g total) by mouth 4 (four) times daily -  with meals and at bedtime. 420 mL 0  . tiotropium (SPIRIVA) 18 MCG inhalation capsule Place 18 mcg into inhaler and inhale daily.    . Wound Dressings (SONAFINE) Apply 1 application topically 2 (two) times daily.    Marland Kitchen zolpidem (AMBIEN) 10 MG tablet Take 0.5 tablets (5 mg total) by mouth at bedtime. 30 tablet 0  . aspirin EC 81 MG tablet Take 1 tablet (81 mg total) by mouth daily. (Patient not taking: Reported on 08/31/2015)     No current facility-administered medications for this encounter.     ALLERGIES: Review of patient's allergies  indicates no known allergies.   LABORATORY DATA:  Lab Results  Component Value Date   WBC 4.6 08/21/2015   HGB 11.1* 08/21/2015   HCT 32.7* 08/21/2015   MCV 87.7 08/21/2015   PLT 447* 08/21/2015   Lab Results  Component Value Date   NA 141 08/21/2015   K 3.3* 08/21/2015   CL 93* 08/09/2015   CO2 23 08/21/2015   Lab Results  Component Value Date   ALT 12 08/21/2015   AST 16 08/21/2015   ALKPHOS 141 08/21/2015   BILITOT <0.30 08/21/2015     NARRATIVE: Sarah Leonard was seen today for weekly treatment management. The chart was checked and the patient's films were reviewed.  C/o throat and chest discomfort. She is not eating because it's too difficult to swallow. She has a sore throat. She is taking carafate. She reports feeling terrible over the weekend, feeing very weak, fatigued, and nauseous. Reports depression. Residing at [facility]. Her sister states they are giving the patient her medications, but they missed her pain, ativan, nausea medication. She feels like wanting. The patient states she is not smoking. She feels nauseous during the encounter.  PHYSICAL EXAMINATION: weight is 189 lb (85.73 kg). Her oral temperature is 97.8 F (36.6 C). Her blood pressure is 129/100 and her pulse is 100. Her respiration is 20.   The patient reports to the clinic in a wheelchair.  ASSESSMENT: The patient is doing satisfactorily with treatment. Her sore throat and difficulty swallowing are side effects from the radiation treatment.  PLAN: We will continue with the patient's radiation treatment as planned. During this encounter she was given 1 tablet of Percocet 5/'325mg'$  and 4 mg of Zofan as facility-administered medication.  This document serves as a record of services personally performed by Kyung Rudd, MD. It was created on his behalf by Darcus Austin, a trained medical scribe. The creation of this record is based on the scribe's personal observations and the provider's statements to  them. This document has been checked and approved by the attending provider.

## 2015-08-31 NOTE — Progress Notes (Signed)
Weekly rad txs lung, 14/33 completed, no skin changes, c/o throat and chest discomfort, not eating too difficulty  Swallowing, gve cola and tolerating well, depressed, said she had a bad weekend being sick, very weak and fatigued, residing at [facility],  Sister stated they are giving her her medication, but she missed her pain med and ativan and nausea med stated, wants to stop rad txs 3:14 PM

## 2015-08-31 NOTE — Progress Notes (Addendum)
patient given '4mg'$  zofran x 1 and 1 percocet 5/'325mg'$ , nausea and pain per Dr. Lisbeth Renshaw request, patient started choking on the zofran ad spit up water, saying that hurts my throat, gave the percocet with applesauce, 4 spoon fuls ,pt kept it down and d/c back top [facility], will call [facility] to let them know medication given at 345pm called [facility], let ring 25 times no answer, spoke with  Kenney Houseman,  LPN aware of zofran and percocet given to patient a 3:46 PM

## 2015-08-31 NOTE — Telephone Encounter (Signed)
I spoke with Wells Guiles, the care coordinator at the facility where minutes [facility] lives. She discussed that the patient has been noncompliant with taking her medications except for her antianxiety medication and pain medication. She has been evaluated by the attending physician there at home, and has a hospice consult and significant consult pending. The patient apparently also has been noncompliant with her nutrition and there is concern that if she does not try to maintain her nutrition, that she will be discharged from the facility to a skilled facility.

## 2015-09-02 ENCOUNTER — Encounter (HOSPITAL_COMMUNITY): Payer: Self-pay | Admitting: Emergency Medicine

## 2015-09-02 ENCOUNTER — Emergency Department (HOSPITAL_COMMUNITY)
Admission: EM | Admit: 2015-09-02 | Discharge: 2015-09-03 | Disposition: A | Discharge status: Home / Self Care | Payer: Medicaid Other | Attending: Emergency Medicine | Admitting: Emergency Medicine

## 2015-09-02 DIAGNOSIS — Z8719 Personal history of other diseases of the digestive system: Secondary | ICD-10-CM | POA: Insufficient documentation

## 2015-09-02 DIAGNOSIS — Z87891 Personal history of nicotine dependence: Secondary | ICD-10-CM | POA: Insufficient documentation

## 2015-09-02 DIAGNOSIS — Z8679 Personal history of other diseases of the circulatory system: Secondary | ICD-10-CM | POA: Insufficient documentation

## 2015-09-02 DIAGNOSIS — Z79899 Other long term (current) drug therapy: Secondary | ICD-10-CM | POA: Diagnosis not present

## 2015-09-02 DIAGNOSIS — Z862 Personal history of diseases of the blood and blood-forming organs and certain disorders involving the immune mechanism: Secondary | ICD-10-CM | POA: Insufficient documentation

## 2015-09-02 DIAGNOSIS — Z794 Long term (current) use of insulin: Secondary | ICD-10-CM | POA: Diagnosis not present

## 2015-09-02 DIAGNOSIS — E119 Type 2 diabetes mellitus without complications: Secondary | ICD-10-CM | POA: Insufficient documentation

## 2015-09-02 DIAGNOSIS — Z87448 Personal history of other diseases of urinary system: Secondary | ICD-10-CM | POA: Diagnosis not present

## 2015-09-02 DIAGNOSIS — C3490 Malignant neoplasm of unspecified part of unspecified bronchus or lung: Secondary | ICD-10-CM | POA: Insufficient documentation

## 2015-09-02 DIAGNOSIS — G8929 Other chronic pain: Secondary | ICD-10-CM | POA: Diagnosis not present

## 2015-09-02 DIAGNOSIS — E875 Hyperkalemia: Secondary | ICD-10-CM | POA: Diagnosis not present

## 2015-09-02 DIAGNOSIS — R070 Pain in throat: Secondary | ICD-10-CM | POA: Diagnosis present

## 2015-09-02 DIAGNOSIS — F319 Bipolar disorder, unspecified: Secondary | ICD-10-CM | POA: Insufficient documentation

## 2015-09-02 DIAGNOSIS — Z8619 Personal history of other infectious and parasitic diseases: Secondary | ICD-10-CM | POA: Diagnosis not present

## 2015-09-02 DIAGNOSIS — Z7951 Long term (current) use of inhaled steroids: Secondary | ICD-10-CM | POA: Diagnosis not present

## 2015-09-02 DIAGNOSIS — J449 Chronic obstructive pulmonary disease, unspecified: Secondary | ICD-10-CM | POA: Insufficient documentation

## 2015-09-02 DIAGNOSIS — Z872 Personal history of diseases of the skin and subcutaneous tissue: Secondary | ICD-10-CM | POA: Insufficient documentation

## 2015-09-02 DIAGNOSIS — Z7982 Long term (current) use of aspirin: Secondary | ICD-10-CM | POA: Diagnosis not present

## 2015-09-02 MED ORDER — OXYCODONE-ACETAMINOPHEN 5-325 MG PO TABS
1.0000 | ORAL_TABLET | Freq: Once | ORAL | Status: AC
Start: 2015-09-02 — End: 2015-09-02
  Administered 2015-09-02: 1 via ORAL
  Filled 2015-09-02: qty 1

## 2015-09-02 NOTE — ED Provider Notes (Signed)
CSN: [id number]     Arrival date & time 09/02/15  2224 History  By signing my name below, I, Eustaquio Maize, attest that this documentation has been prepared under the direction and in the presence of Jola Schmidt, MD. Electronically Signed: Eustaquio Maize, ED Scribe. 09/02/2015. 11:32 PM.    Chief Complaint  Patient presents with  . Pain   The history is provided by the patient. No language interpreter was used.     HPI Comments: Sarah Leonard is a 55 y.o. female with hx active lung cancer who presents to the Emergency Department complaining of gradual onset, constant, worsening, throat pain radiating down chest after running out of Percocet earlier today. Pt states she is supposed to have her medication refilled in the morning. She was given a Percocet in the ED which she states alleviated her pain. Denies any other associated symptoms.   Past Medical History  Diagnosis Date  . Diabetes mellitus   . Pancreatitis   . Anemia   . Bipolar 1 disorder ([place])   . Alcohol abuse   . 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 COPD (chronic obstructive pulmonary disease) ([place])   . Hypotension   . Major depressive disorder ([place])   . Cocaine abuse   . Chronic back pain   . Renal disorder   . Acidosis   . Hyperkalemia   . Abscess   . Acute encephalopathy   . Septic shock ([place])   . Chemotherapy induced neutropenia ([place]) 08/08/2015   Past Surgical History  Procedure Laterality Date  . Cholecystectomy    . Appendectomy    . Tonsillectomy    . Incision and drainage abscess Right 03/01/2013    Procedure: INCISION AND DRAINAGE ABSCESS;  Surgeon: Harl Bowie, MD;  Location: [facility];  Service: General;  Laterality: Right;  . Cesarean section    . Video bronchoscopy with endobronchial ultrasound N/A 06/21/2015    Procedure: VIDEO BRONCHOSCOPY WITH ENDOBRONCHIAL ULTRASOUND;  Surgeon: Javier Glazier, MD;  Location: [facility];  Service: Thoracic;  Laterality: N/A;  . Flexible  bronchoscopy N/A 06/21/2015    Procedure: BRONCHOSCOPY;  Surgeon: Javier Glazier, MD;  Location: [facility];  Service: Thoracic;  Laterality: N/A;   Family History  Problem Relation Age of Onset  . Diabetes Mother   . Cancer Mother     breast   Social History  Substance Use Topics  . Smoking status: Former Smoker -- 0.00 packs/day    Quit date: 07/15/2015  . Smokeless tobacco: Never Used  . Alcohol Use: No     Comment: hx of etoh abuse   OB History    No data available     Review of Systems  A complete 10 system review of systems was obtained and all systems are negative except as noted in the HPI and PMH.   Allergies  Review of patient's allergies indicates no known allergies.  Home Medications   Prior to Admission medications   Medication Sig Start Date End Date Taking? Authorizing Provider  acetaminophen (TYLENOL) 325 MG tablet Take 325 mg by mouth 3 (three) times daily. Prior to fish oil   Yes Historical Provider, MD  albuterol (PROVENTIL HFA;VENTOLIN HFA) 108 (90 BASE) MCG/ACT inhaler Inhale 2 puffs into the lungs every 4 (four) hours as needed for wheezing or shortness of breath. 80/9/98  Yes Delora Fuel, MD  aspirin EC 81 MG tablet Take 1 tablet (81 mg total) by mouth daily.  06/25/15  Yes Velvet Bathe, MD  budesonide-formoterol (SYMBICORT) 80-4.5 MCG/ACT inhaler Inhale 2 puffs into the lungs 2 (two) times daily.   Yes Historical Provider, MD  calcium-vitamin D (OSCAL WITH D) 500-200 MG-UNIT tablet Take 1 tablet by mouth 2 (two) times daily.   Yes Historical Provider, MD  cholecalciferol (VITAMIN D) 1000 UNITS tablet Take 1,000 Units by mouth daily.   Yes Historical Provider, MD  docusate sodium (COLACE) 100 MG capsule Take 100 mg by mouth 2 (two) times daily.   Yes Historical Provider, MD  gabapentin (NEURONTIN) 300 MG capsule Take 600 mg by mouth 3 (three) times daily.   Yes Historical Provider, MD  insulin aspart (NOVOLOG) 100 UNIT/ML injection Inject 5 Units into the  skin 3 (three) times daily with meals. 06/11/15  Yes Orson Eva, MD  insulin glargine (LANTUS) 100 UNIT/ML injection Inject 0.1 mLs (10 Units total) into the skin at bedtime. 06/11/15  Yes David Tat, MD  ipratropium-albuterol (DUONEB) 0.5-2.5 (3) MG/3ML SOLN Take 3 mLs by nebulization every 6 (six) hours as needed. 06/11/15  Yes Orson Eva, MD  lamoTRIgine (LAMICTAL) 100 MG tablet Take 125 mg by mouth 2 (two) times daily. Take with 25 mg tablet   Yes Historical Provider, MD  lamoTRIgine (LAMICTAL) 25 MG tablet Take 125 mg by mouth 2 (two) times daily. Takes with '100mg'$  tablet   Yes Historical Provider, MD  lidocaine-prilocaine (EMLA) cream Apply 1 application topically as needed. Apply to port site 1-1.5 hours prior to chemotherapy appt 08/21/15  Yes Curt Bears, MD  magnesium oxide (MAG-OX) 400 (241.3 MG) MG tablet Take 2 tablets (800 mg total) by mouth 3 (three) times daily. 07/25/15  Yes Reyne Dumas, MD  niacin (NIASPAN) 750 MG CR tablet Take 750 mg by mouth at bedtime.   Yes Historical Provider, MD  omeprazole (PRILOSEC) 20 MG capsule Take 20 mg by mouth daily before supper.    Yes Historical Provider, MD  oxyCODONE-acetaminophen (PERCOCET/ROXICET) 5-325 MG tablet Take 1 tablet by mouth every 8 (eight) hours as needed for severe pain. 08/21/15  Yes Curt Bears, MD  potassium chloride 20 MEQ TBCR Take 40 mEq by mouth 2 (two) times daily. 07/26/15  Yes Reyne Dumas, MD  prochlorperazine (COMPAZINE) 10 MG tablet Take 1 tablet (10 mg total) by mouth every 6 (six) hours as needed for nausea or vomiting. 08/14/15  Yes Curt Bears, MD  promethazine (PHENERGAN) 12.5 MG tablet Take 12.5 mg by mouth every 6 (six) hours as needed for nausea or vomiting.   Yes Historical Provider, MD  QUEtiapine (SEROQUEL XR) 400 MG 24 hr tablet Take 800 mg by mouth at bedtime.   Yes Historical Provider, MD  simvastatin (ZOCOR) 40 MG tablet Take 40 mg by mouth daily at 6 PM.    Yes Historical Provider, MD  sucralfate  (CARAFATE) 1 GM/10ML suspension Take 10 mLs (1 g total) by mouth 4 (four) times daily -  with meals and at bedtime. 08/29/15  Yes Kyung Rudd, MD  tiotropium (SPIRIVA) 18 MCG inhalation capsule Place 18 mcg into inhaler and inhale daily.   Yes Historical Provider, MD  zolpidem (AMBIEN) 5 MG tablet Take 5 mg by mouth at bedtime as needed for sleep.   Yes Historical Provider, MD  LORazepam (ATIVAN) 0.5 MG tablet Take 1 tablet (0.5 mg total) by mouth every 8 (eight) hours as needed for anxiety. Patient not taking: Reported on 09/02/2015 07/25/15   Reyne Dumas, MD   Triage Vitals:  BP 118/63 mmHg  Pulse 112  Temp(Src) 98.5 F (36.9 C) (Oral)  Resp 17  SpO2 100%   Physical Exam  Constitutional: She is oriented to person, place, and time. She appears well-developed and well-nourished.  HENT:  Head: Normocephalic.  Eyes: EOM are normal.  Neck: Normal range of motion.  Pulmonary/Chest: Effort normal.  Abdominal: She exhibits no distension.  Musculoskeletal: Normal range of motion.  Neurological: She is alert and oriented to person, place, and time.  Psychiatric: She has a normal mood and affect.  Nursing note and vitals reviewed.   ED Course  Procedures (including critical care time)  DIAGNOSTIC STUDIES: Oxygen Saturation is 100% on RA, normal by my interpretation.    COORDINATION OF CARE: 11:30 PM-Discussed treatment plan which includes Percocet tablet with pt at bedside and pt agreed to plan.   Labs Review Labs Reviewed - No data to display  Imaging Review No results found.   EKG Interpretation None      MDM   Final diagnoses:  None    Pain treated. No other complaints. Refill tomorrow by her MD   I personally performed the services described in this documentation, which was scribed in my presence. The recorded information has been reviewed and is accurate.        Jola Schmidt, MD 09/03/15 0000

## 2015-09-02 NOTE — ED Notes (Addendum)
PTAR presents with a 55 yo patient from [facility] with Stage 3 lung cancer.  Pt received last Percocet at 3 pm today and is supposed to have medication administrated PRN every 6 hours.  The nursing home facility medication tech reported to PTAR that they ran out of her prescription and won't have any unitl the morning and that the patient requested to come to the hospital because she could not go all night without medication.  The patient is currently c/o upper chest pain radiating to mid left side of chest. Pt also c/o itching all over.

## 2015-09-02 NOTE — ED Notes (Signed)
Bed: WA22 Expected date:  Expected time:  Means of arrival:  Comments: EMS 

## 2015-09-03 ENCOUNTER — Other Ambulatory Visit: Payer: Self-pay | Admitting: Radiation Oncology

## 2015-09-03 ENCOUNTER — Other Ambulatory Visit: Payer: Self-pay | Admitting: *Deleted

## 2015-09-03 ENCOUNTER — Telehealth: Payer: Self-pay | Admitting: *Deleted

## 2015-09-03 ENCOUNTER — Ambulatory Visit
Admission: RE | Admit: 2015-09-03 | Discharge: 2015-09-03 | Disposition: A | Discharge status: Home / Self Care | Payer: Medicaid Other | Source: Ambulatory Visit | Attending: Radiation Oncology | Admitting: Radiation Oncology

## 2015-09-03 DIAGNOSIS — E222 Syndrome of inappropriate secretion of antidiuretic hormone: Secondary | ICD-10-CM

## 2015-09-03 DIAGNOSIS — C3431 Malignant neoplasm of lower lobe, right bronchus or lung: Secondary | ICD-10-CM

## 2015-09-03 DIAGNOSIS — Z510 Encounter for antineoplastic radiation therapy: Secondary | ICD-10-CM | POA: Diagnosis not present

## 2015-09-03 MED ORDER — OXYCODONE-ACETAMINOPHEN 5-325 MG PO TABS: 1.0000 | ORAL_TABLET | Freq: Three times a day (TID) | ORAL | Status: DC | PRN

## 2015-09-03 MED ORDER — OXYCODONE-ACETAMINOPHEN 5-325 MG PO TABS: 1.0000 | ORAL_TABLET | Freq: Three times a day (TID) | ORAL | Status: AC | PRN

## 2015-09-03 NOTE — Telephone Encounter (Signed)
All from [facility] [address] care, pt out of percocet, Please fax to [facility] [phone]. Rx sent

## 2015-09-03 NOTE — Telephone Encounter (Signed)
Patient called and I returned her call when back from lunch, she stated she was hurting and out of her percocet, she did get Carafate refilled on the 18th January, patient was crying asking for help ,,was feeling bad, asked her to come for her treatment and she could be seen before or after rad tx s today, and would ask  if refill on percocet  Could be waiting for her when she arrives, patient said she would come around to nursing after her treatment, last percocet written 08/21/15 by Dr. Julien Nordmann , patient thanked me for return call and will be here today for her 230 appt 12:10 PM

## 2015-09-04 ENCOUNTER — Ambulatory Visit
Admission: RE | Admit: 2015-09-04 | Discharge: 2015-09-04 | Disposition: A | Discharge status: Home / Self Care | Payer: Medicaid Other | Source: Ambulatory Visit | Attending: Radiation Oncology | Admitting: Radiation Oncology

## 2015-09-04 ENCOUNTER — Telehealth: Payer: Self-pay | Admitting: *Deleted

## 2015-09-04 DIAGNOSIS — Z510 Encounter for antineoplastic radiation therapy: Secondary | ICD-10-CM | POA: Diagnosis not present

## 2015-09-04 NOTE — Telephone Encounter (Signed)
Sarah Leonard, RT therapist cae to nursing stating patient was requesting ativan rx, that the Dr. At [facility] said all rx's had to come from Dr. Lisbeth Renshaw", asked Bryson Ha , [place], if she was willing to write rx, she satted yes but to call and clarify with [facility]", I called and spoke with Rebecca,patient's nurse and she stated"NO the Dr. Azucena Fallen say that and he d/c's her ativan altogether, we did get the written percocet rx from Dr. Lisbeth Renshaw" patient sees her Pyschiatrist MD on Fridays and he is managing her meds, thanked Wells Guiles and said we wouldn't be filling the ativan for this patient, spoke with patient's sister in dressing room and gave her updated status , left cola with her for the patient' sister said they were trying to get her into a skilled nursing home 3:37 PM

## 2015-09-05 ENCOUNTER — Ambulatory Visit
Admission: RE | Admit: 2015-09-05 | Discharge: 2015-09-05 | Disposition: A | Discharge status: Home / Self Care | Payer: Medicaid Other | Source: Ambulatory Visit | Attending: Radiation Oncology | Admitting: Radiation Oncology

## 2015-09-05 DIAGNOSIS — Z510 Encounter for antineoplastic radiation therapy: Secondary | ICD-10-CM | POA: Diagnosis not present

## 2015-09-06 ENCOUNTER — Ambulatory Visit
Admission: RE | Admit: 2015-09-06 | Discharge: 2015-09-06 | Disposition: A | Discharge status: Home / Self Care | Payer: Medicaid Other | Source: Ambulatory Visit | Attending: Radiation Oncology | Admitting: Radiation Oncology

## 2015-09-06 ENCOUNTER — Ambulatory Visit: Payer: Medicaid Other | Admitting: Radiation Oncology

## 2015-09-06 DIAGNOSIS — Z510 Encounter for antineoplastic radiation therapy: Secondary | ICD-10-CM | POA: Diagnosis not present

## 2015-09-07 ENCOUNTER — Ambulatory Visit
Admission: RE | Admit: 2015-09-07 | Discharge: 2015-09-07 | Disposition: A | Discharge status: Home / Self Care | Payer: Medicaid Other | Source: Ambulatory Visit | Attending: Radiation Oncology | Admitting: Radiation Oncology

## 2015-09-07 ENCOUNTER — Encounter: Payer: Self-pay | Admitting: Radiation Oncology

## 2015-09-07 VITALS — BP 104/72 | HR 111 | Temp 97.6°F | Resp 20 | Wt 188.2 lb

## 2015-09-07 DIAGNOSIS — C3431 Malignant neoplasm of lower lobe, right bronchus or lung: Secondary | ICD-10-CM

## 2015-09-07 DIAGNOSIS — Z510 Encounter for antineoplastic radiation therapy: Secondary | ICD-10-CM | POA: Diagnosis not present

## 2015-09-07 NOTE — Progress Notes (Signed)
Weekly rad txs lung 19/33 completed, patient stated she got nauseated last night at 1130pm for about 1.5hours, asking for nausea pill today,  She can get nausea mediation at [facility], sister reiterated this to the patient, no coughing, depressed, fatigued,loss appetite, throat hurts feels like she is choking when eating solids/liquids, takes carafate 4x day, , declined coke  Soda today 3:05 PM BP 104/72 mmHg  Pulse 111  Temp(Src) 97.6 F (36.4 C) (Oral)  Resp 20  Wt 188 lb 3.2 oz (85.367 kg)  SpO2 100%  Wt Readings from Last 3 Encounters:  09/07/15 188 lb 3.2 oz (85.367 kg)  08/31/15 189 lb (85.73 kg)  08/24/15 204 lb 9.6 oz (92.806 kg)

## 2015-09-09 NOTE — Progress Notes (Signed)
Department of Radiation Oncology  Phone:  [phone] Fax:        [phone]  Weekly Treatment Note    Name: Sarah Leonard Date: 09/09/2015 MRN: [id number] DOB: 11-29-1960   Diagnosis:     ICD-9-CM ICD-10-CM   1. Cancer of lower lobe of right lung (HCC) 162.5 C34.31      Current dose: 34.2 Gy  Current fraction: 19   MEDICATIONS: Current Outpatient Prescriptions  Medication Sig Dispense Refill  . acetaminophen (TYLENOL) 325 MG tablet Take 325 mg by mouth 3 (three) times daily. Prior to fish oil    . albuterol (PROVENTIL HFA;VENTOLIN HFA) 108 (90 BASE) MCG/ACT inhaler Inhale 2 puffs into the lungs every 4 (four) hours as needed for wheezing or shortness of breath. 1 Inhaler 0  . aspirin EC 81 MG tablet Take 1 tablet (81 mg total) by mouth daily.    . budesonide-formoterol (SYMBICORT) 80-4.5 MCG/ACT inhaler Inhale 2 puffs into the lungs 2 (two) times daily.    . calcium-vitamin D (OSCAL WITH D) 500-200 MG-UNIT tablet Take 1 tablet by mouth 2 (two) times daily.    . cholecalciferol (VITAMIN D) 1000 UNITS tablet Take 1,000 Units by mouth daily.    Marland Kitchen docusate sodium (COLACE) 100 MG capsule Take 100 mg by mouth 2 (two) times daily.    Marland Kitchen gabapentin (NEURONTIN) 300 MG capsule Take 600 mg by mouth 3 (three) times daily.    . insulin aspart (NOVOLOG) 100 UNIT/ML injection Inject 5 Units into the skin 3 (three) times daily with meals. 10 mL 0  . insulin glargine (LANTUS) 100 UNIT/ML injection Inject 0.1 mLs (10 Units total) into the skin at bedtime. 10 mL 0  . ipratropium-albuterol (DUONEB) 0.5-2.5 (3) MG/3ML SOLN Take 3 mLs by nebulization every 6 (six) hours as needed. 360 mL 0  . lamoTRIgine (LAMICTAL) 100 MG tablet Take 125 mg by mouth 2 (two) times daily. Take with 25 mg tablet    . lamoTRIgine (LAMICTAL) 25 MG tablet Take 125 mg by mouth 2 (two) times daily. Takes with '100mg'$  tablet    . lidocaine-prilocaine (EMLA) cream Apply 1 application topically as needed. Apply to port  site 1-1.5 hours prior to chemotherapy appt 30 g 0  . magnesium oxide (MAG-OX) 400 (241.3 MG) MG tablet Take 2 tablets (800 mg total) by mouth 3 (three) times daily. 90 tablet 2  . niacin (NIASPAN) 750 MG CR tablet Take 750 mg by mouth at bedtime.    Marland Kitchen omeprazole (PRILOSEC) 20 MG capsule Take 20 mg by mouth daily before supper.     Marland Kitchen oxyCODONE-acetaminophen (PERCOCET/ROXICET) 5-325 MG tablet Take 1 tablet by mouth every 8 (eight) hours as needed for severe pain. 40 tablet 0  . potassium chloride 20 MEQ TBCR Take 40 mEq by mouth 2 (two) times daily. 60 tablet 0  . prochlorperazine (COMPAZINE) 10 MG tablet Take 1 tablet (10 mg total) by mouth every 6 (six) hours as needed for nausea or vomiting. 30 tablet 0  . promethazine (PHENERGAN) 12.5 MG tablet Take 12.5 mg by mouth every 6 (six) hours as needed for nausea or vomiting.    Marland Kitchen QUEtiapine (SEROQUEL XR) 400 MG 24 hr tablet Take 800 mg by mouth at bedtime.    . simvastatin (ZOCOR) 40 MG tablet Take 40 mg by mouth daily at 6 PM.     . sucralfate (CARAFATE) 1 GM/10ML suspension Take 10 mLs (1 g total) by mouth 4 (four) times daily -  with meals and  at bedtime. 420 mL 0  . tiotropium (SPIRIVA) 18 MCG inhalation capsule Place 18 mcg into inhaler and inhale daily.    Marland Kitchen zolpidem (AMBIEN) 5 MG tablet Take 5 mg by mouth at bedtime as needed for sleep.     No current facility-administered medications for this encounter.     ALLERGIES: Review of patient's allergies indicates no known allergies.   LABORATORY DATA:  Lab Results  Component Value Date   WBC 4.6 08/21/2015   HGB 11.1* 08/21/2015   HCT 32.7* 08/21/2015   MCV 87.7 08/21/2015   PLT 447* 08/21/2015   Lab Results  Component Value Date   NA 141 08/21/2015   K 3.3* 08/21/2015   CL 93* 08/09/2015   CO2 23 08/21/2015   Lab Results  Component Value Date   ALT 12 08/21/2015   AST 16 08/21/2015   ALKPHOS 141 08/21/2015   BILITOT <0.30 08/21/2015     NARRATIVE: Sarah Leonard was  seen today for weekly treatment management. The chart was checked and the patient's films were reviewed.  Weekly rad txs lung 19/33 completed, patient stated she got nauseated last night at 1130pm for about 1.5hours, asking for nausea pill today,  She can get nausea mediation at [facility], sister reiterated this to the patient, no coughing, depressed, fatigued,loss appetite, throat hurts feels like she is choking when eating solids/liquids, takes carafate 4x day, , declined coke  Soda today 11:24 AM BP 104/72 mmHg  Pulse 111  Temp(Src) 97.6 F (36.4 C) (Oral)  Resp 20  Wt 188 lb 3.2 oz (85.367 kg)  SpO2 100%  Wt Readings from Last 3 Encounters:  09/07/15 188 lb 3.2 oz (85.367 kg)  08/31/15 189 lb (85.73 kg)  08/24/15 204 lb 9.6 oz (92.806 kg)    PHYSICAL EXAMINATION: weight is 188 lb 3.2 oz (85.367 kg). Her oral temperature is 97.6 F (36.4 C). Her blood pressure is 104/72 and her pulse is 111. Her respiration is 20 and oxygen saturation is 100%.        ASSESSMENT: The patient is doing satisfactorily with treatment.  PLAN: We will continue with the patient's radiation treatment as planned. The patient is doing satisfactorily with treatment. Encouragement was given today as she states that she is having somewhat of a difficult time. Her symptoms are currently being managed and her living situation is causing some stress for her.

## 2015-09-10 ENCOUNTER — Encounter (HOSPITAL_COMMUNITY): Payer: Self-pay

## 2015-09-10 ENCOUNTER — Ambulatory Visit
Admission: RE | Admit: 2015-09-10 | Discharge: 2015-09-10 | Disposition: A | Discharge status: Home / Self Care | Payer: Medicaid Other | Source: Ambulatory Visit | Attending: Radiation Oncology | Admitting: Radiation Oncology

## 2015-09-10 ENCOUNTER — Ambulatory Visit (HOSPITAL_COMMUNITY)
Admission: RE | Admit: 2015-09-10 | Discharge: 2015-09-10 | Disposition: A | Discharge status: Home / Self Care | Payer: Medicaid Other | Source: Ambulatory Visit | Attending: Internal Medicine | Admitting: Internal Medicine

## 2015-09-10 DIAGNOSIS — C3431 Malignant neoplasm of lower lobe, right bronchus or lung: Secondary | ICD-10-CM | POA: Insufficient documentation

## 2015-09-10 DIAGNOSIS — R911 Solitary pulmonary nodule: Secondary | ICD-10-CM | POA: Diagnosis not present

## 2015-09-10 DIAGNOSIS — I2510 Atherosclerotic heart disease of native coronary artery without angina pectoris: Secondary | ICD-10-CM | POA: Diagnosis not present

## 2015-09-10 DIAGNOSIS — E222 Syndrome of inappropriate secretion of antidiuretic hormone: Secondary | ICD-10-CM | POA: Insufficient documentation

## 2015-09-10 DIAGNOSIS — Z510 Encounter for antineoplastic radiation therapy: Secondary | ICD-10-CM | POA: Diagnosis not present

## 2015-09-10 MED ORDER — IOHEXOL 300 MG/ML  SOLN
75.0000 mL | Freq: Once | INTRAMUSCULAR | Status: AC | PRN
  Administered 2015-09-10: 75 mL via INTRAVENOUS

## 2015-09-11 ENCOUNTER — Ambulatory Visit (HOSPITAL_BASED_OUTPATIENT_CLINIC_OR_DEPARTMENT_OTHER): Payer: Medicaid Other

## 2015-09-11 ENCOUNTER — Telehealth: Payer: Self-pay | Admitting: Internal Medicine

## 2015-09-11 ENCOUNTER — Ambulatory Visit
Admission: RE | Admit: 2015-09-11 | Discharge: 2015-09-11 | Disposition: A | Discharge status: Home / Self Care | Payer: Medicaid Other | Source: Ambulatory Visit | Attending: Radiation Oncology | Admitting: Radiation Oncology

## 2015-09-11 ENCOUNTER — Other Ambulatory Visit (HOSPITAL_BASED_OUTPATIENT_CLINIC_OR_DEPARTMENT_OTHER): Payer: Medicaid Other

## 2015-09-11 ENCOUNTER — Ambulatory Visit (HOSPITAL_BASED_OUTPATIENT_CLINIC_OR_DEPARTMENT_OTHER): Payer: Medicaid Other | Admitting: Internal Medicine

## 2015-09-11 ENCOUNTER — Ambulatory Visit: Payer: Medicaid Other | Admitting: Nutrition

## 2015-09-11 ENCOUNTER — Encounter: Payer: Self-pay | Admitting: Internal Medicine

## 2015-09-11 VITALS — BP 103/68 | HR 100 | Temp 98.7°F | Resp 18 | Ht 63.0 in | Wt 191.1 lb

## 2015-09-11 DIAGNOSIS — E871 Hypo-osmolality and hyponatremia: Secondary | ICD-10-CM

## 2015-09-11 DIAGNOSIS — Z510 Encounter for antineoplastic radiation therapy: Secondary | ICD-10-CM | POA: Diagnosis not present

## 2015-09-11 DIAGNOSIS — E222 Syndrome of inappropriate secretion of antidiuretic hormone: Secondary | ICD-10-CM

## 2015-09-11 DIAGNOSIS — R070 Pain in throat: Secondary | ICD-10-CM

## 2015-09-11 DIAGNOSIS — C3431 Malignant neoplasm of lower lobe, right bronchus or lung: Secondary | ICD-10-CM

## 2015-09-11 DIAGNOSIS — Z5111 Encounter for antineoplastic chemotherapy: Secondary | ICD-10-CM | POA: Diagnosis not present

## 2015-09-11 DIAGNOSIS — C3491 Malignant neoplasm of unspecified part of right bronchus or lung: Secondary | ICD-10-CM

## 2015-09-11 LAB — COMPREHENSIVE METABOLIC PANEL
ALBUMIN: 3.3 g/dL — AB (ref 3.5–5.0)
ALK PHOS: 112 U/L (ref 40–150)
ALT: 13 U/L (ref 0–55)
AST: 11 U/L (ref 5–34)
Anion Gap: 14 mEq/L — ABNORMAL HIGH (ref 3–11)
BUN: 8.4 mg/dL (ref 7.0–26.0)
CALCIUM: 9.8 mg/dL (ref 8.4–10.4)
CHLORIDE: 98 meq/L (ref 98–109)
CO2: 24 mEq/L (ref 22–29)
Creatinine: 0.8 mg/dL (ref 0.6–1.1)
EGFR: 80 mL/min/{1.73_m2} — AB (ref >90)
Glucose: 246 mg/dl — ABNORMAL HIGH (ref 70–140)
POTASSIUM: 3.5 meq/L (ref 3.5–5.1)
Sodium: 136 mEq/L (ref 136–145)
Total Bilirubin: 0.3 mg/dL (ref 0.20–1.20)
Total Protein: 6.6 g/dL (ref 6.4–8.3)

## 2015-09-11 LAB — CBC WITH DIFFERENTIAL/PLATELET
BASO%: 0 % (ref 0.0–2.0)
Basophils Absolute: 0 10*3/uL (ref 0.0–0.1)
EOS ABS: 0 10*3/uL (ref 0.0–0.5)
EOS%: 0.5 % (ref 0.0–7.0)
HCT: 28.8 % — ABNORMAL LOW (ref 34.8–46.6)
HGB: 9.7 g/dL — ABNORMAL LOW (ref 11.6–15.9)
LYMPH%: 7.5 % — AB (ref 14.0–49.7)
MCH: 30.1 pg (ref 25.1–34.0)
MCHC: 33.7 g/dL (ref 31.5–36.0)
MCV: 89.4 fL (ref 79.5–101.0)
MONO#: 0.3 10*3/uL (ref 0.1–0.9)
MONO%: 7.5 % (ref 0.0–14.0)
NEUT%: 84.5 % — AB (ref 38.4–76.8)
NEUTROS ABS: 3.3 10*3/uL (ref 1.5–6.5)
PLATELETS: 138 10*3/uL — AB (ref 145–400)
RBC: 3.22 10*6/uL — AB (ref 3.70–5.45)
RDW: 18.3 % — ABNORMAL HIGH (ref 11.2–14.5)
WBC: 3.9 10*3/uL (ref 3.9–10.3)
lymph#: 0.3 10*3/uL — ABNORMAL LOW (ref 0.9–3.3)

## 2015-09-11 MED ORDER — SODIUM CHLORIDE 0.9 % IV SOLN
Freq: Once | INTRAVENOUS | Status: AC
  Administered 2015-09-11: 10:00:00 via INTRAVENOUS
  Filled 2015-09-11: qty 8

## 2015-09-11 MED ORDER — SODIUM CHLORIDE 0.9 % IV SOLN
Freq: Once | INTRAVENOUS | Status: AC
  Administered 2015-09-11: 10:00:00 via INTRAVENOUS

## 2015-09-11 MED ORDER — SODIUM CHLORIDE 0.9 % IV SOLN
120.0000 mg/m2 | Freq: Once | INTRAVENOUS | Status: AC
  Administered 2015-09-11: 260 mg via INTRAVENOUS
  Filled 2015-09-11: qty 13

## 2015-09-11 MED ORDER — SODIUM CHLORIDE 0.9 % IJ SOLN
10.0000 mL | INTRAMUSCULAR | Status: DC | PRN
  Administered 2015-09-11: 10 mL
  Filled 2015-09-11: qty 10

## 2015-09-11 MED ORDER — SODIUM CHLORIDE 0.9 % IV SOLN
730.0000 mg | Freq: Once | INTRAVENOUS | Status: AC
  Administered 2015-09-11: 730 mg via INTRAVENOUS
  Filled 2015-09-11: qty 73

## 2015-09-11 MED ORDER — HEPARIN SOD (PORK) LOCK FLUSH 100 UNIT/ML IV SOLN
500.0000 [IU] | Freq: Once | INTRAVENOUS | Status: AC | PRN
  Administered 2015-09-11: 500 [IU]
  Filled 2015-09-11: qty 5

## 2015-09-11 NOTE — Progress Notes (Signed)
[facility] [phone]   [phone]  OFFICE PROGRESS NOTE  [address], [place] [address]  DIAGNOSIS: Limited stage (T1a, N2, M0) small cell lung cancer presented with right lower lobe pulmonary nodule in addition to mediastinal lymphadenopathy, diagnosed in November 2016   PRIOR THERAPY: None.  CURRENT THERAPY: Systemic chemotherapy in the form of carboplatin for AUC of 5 on day 1 and etoposide 120 MG/M2 on days 1, 2 and 3 with Neulasta support on day 4. Status post 2 cycles. This is concurrent with radiotherapy under the care of Dr. Tammi Klippel.  INTERVAL HISTORY: Sarah Leonard 55 y.o. female returns to the clinic today for follow-up visit accompanied by her sister [name]. The patient tolerated the second cycle of her treatment fairly well with no significant complaints except for nausea for a few days. She continues to have sore throat with radiation treatment. She denied having any significant fever or chills. She has no chest pain but continues to have shortness of breath with exertion with mild cough and no hemoptysis. She had repeat CT scan of the chest performed recently and she is here for evaluation and discussion of her scan results before starting cycle #3.  MEDICAL HISTORY: Past Medical History  Diagnosis Date  . Pancreatitis   . Anemia   . Bipolar 1 disorder ([place])   . Alcohol abuse   . 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 COPD (chronic obstructive pulmonary disease) ([place])   . Hypotension   . Major depressive disorder ([place])   . Cocaine abuse   . Chronic back pain   . Renal disorder   . Acidosis   . Hyperkalemia   . Abscess   . Acute encephalopathy   . Septic shock ([place])   . Chemotherapy induced neutropenia ([place]) 08/08/2015  . Diabetes mellitus     ALLERGIES:  has No Known Allergies.  MEDICATIONS:  Current Outpatient Prescriptions  Medication Sig Dispense Refill  .  acetaminophen (TYLENOL) 325 MG tablet Take 325 mg by mouth 3 (three) times daily. Prior to fish oil    . albuterol (PROVENTIL HFA;VENTOLIN HFA) 108 (90 BASE) MCG/ACT inhaler Inhale 2 puffs into the lungs every 4 (four) hours as needed for wheezing or shortness of breath. 1 Inhaler 0  . aspirin EC 81 MG tablet Take 1 tablet (81 mg total) by mouth daily.    . budesonide-formoterol (SYMBICORT) 80-4.5 MCG/ACT inhaler Inhale 2 puffs into the lungs 2 (two) times daily.    . calcium-vitamin D (OSCAL WITH D) 500-200 MG-UNIT tablet Take 1 tablet by mouth 2 (two) times daily.    . cholecalciferol (VITAMIN D) 1000 UNITS tablet Take 1,000 Units by mouth daily.    Marland Kitchen docusate sodium (COLACE) 100 MG capsule Take 100 mg by mouth 2 (two) times daily.    Marland Kitchen gabapentin (NEURONTIN) 300 MG capsule Take 600 mg by mouth 3 (three) times daily.    . insulin aspart (NOVOLOG) 100 UNIT/ML injection Inject 5 Units into the skin 3 (three) times daily with meals. 10 mL 0  . insulin glargine (LANTUS) 100 UNIT/ML injection Inject 0.1 mLs (10 Units total) into the skin at bedtime. 10 mL 0  . ipratropium-albuterol (DUONEB) 0.5-2.5 (3) MG/3ML SOLN Take 3 mLs by nebulization every 6 (six) hours as needed. 360 mL 0  . lamoTRIgine (LAMICTAL) 100 MG tablet Take 125 mg by mouth 2 (two) times daily. Take with 25 mg  tablet    . lamoTRIgine (LAMICTAL) 25 MG tablet Take 125 mg by mouth 2 (two) times daily. Takes with '100mg'$  tablet    . lidocaine-prilocaine (EMLA) cream Apply 1 application topically as needed. Apply to port site 1-1.5 hours prior to chemotherapy appt 30 g 0  . magnesium oxide (MAG-OX) 400 (241.3 MG) MG tablet Take 2 tablets (800 mg total) by mouth 3 (three) times daily. 90 tablet 2  . niacin (NIASPAN) 750 MG CR tablet Take 750 mg by mouth at bedtime.    Marland Kitchen omeprazole (PRILOSEC) 20 MG capsule Take 20 mg by mouth daily before supper.     Marland Kitchen oxyCODONE-acetaminophen (PERCOCET/ROXICET) 5-325 MG tablet Take 1 tablet by mouth every 8  (eight) hours as needed for severe pain. 40 tablet 0  . potassium chloride 20 MEQ TBCR Take 40 mEq by mouth 2 (two) times daily. 60 tablet 0  . prochlorperazine (COMPAZINE) 10 MG tablet Take 1 tablet (10 mg total) by mouth every 6 (six) hours as needed for nausea or vomiting. 30 tablet 0  . promethazine (PHENERGAN) 12.5 MG tablet Take 12.5 mg by mouth every 6 (six) hours as needed for nausea or vomiting.    Marland Kitchen QUEtiapine (SEROQUEL XR) 400 MG 24 hr tablet Take 800 mg by mouth at bedtime.    . simvastatin (ZOCOR) 40 MG tablet Take 40 mg by mouth daily at 6 PM.     . sucralfate (CARAFATE) 1 GM/10ML suspension Take 10 mLs (1 g total) by mouth 4 (four) times daily -  with meals and at bedtime. 420 mL 0  . tiotropium (SPIRIVA) 18 MCG inhalation capsule Place 18 mcg into inhaler and inhale daily.    Marland Kitchen zolpidem (AMBIEN) 5 MG tablet Take 5 mg by mouth at bedtime as needed for sleep.     No current facility-administered medications for this visit.    SURGICAL HISTORY:  Past Surgical History  Procedure Laterality Date  . Cholecystectomy    . Appendectomy    . Tonsillectomy    . Incision and drainage abscess Right 03/01/2013    Procedure: INCISION AND DRAINAGE ABSCESS;  Surgeon: Harl Bowie, MD;  Location: [facility];  Service: General;  Laterality: Right;  . Cesarean section    . Video bronchoscopy with endobronchial ultrasound N/A 06/21/2015    Procedure: VIDEO BRONCHOSCOPY WITH ENDOBRONCHIAL ULTRASOUND;  Surgeon: Javier Glazier, MD;  Location: [facility];  Service: Thoracic;  Laterality: N/A;  . Flexible bronchoscopy N/A 06/21/2015    Procedure: BRONCHOSCOPY;  Surgeon: Javier Glazier, MD;  Location: [facility];  Service: Thoracic;  Laterality: N/A;    REVIEW OF SYSTEMS:  Constitutional: positive for fatigue Eyes: negative Ears, nose, mouth, throat, and face: negative Respiratory: negative Cardiovascular: negative Gastrointestinal: positive for nausea Genitourinary:negative Integument/breast:  negative Hematologic/lymphatic: negative Musculoskeletal:negative Neurological: negative Behavioral/Psych: negative Endocrine: negative Allergic/Immunologic: negative   PHYSICAL EXAMINATION: General appearance: alert, cooperative, fatigued and no distress Head: Normocephalic, without obvious abnormality, atraumatic Neck: no adenopathy, no JVD, supple, symmetrical, trachea midline and thyroid not enlarged, symmetric, no tenderness/mass/nodules Lymph nodes: Cervical, supraclavicular, and axillary nodes normal. Resp: clear to auscultation bilaterally Back: symmetric, no curvature. ROM normal. No CVA tenderness. Cardio: regular rate and rhythm, S1, S2 normal, no murmur, click, rub or gallop GI: soft, non-tender; bowel sounds normal; no masses,  no organomegaly Extremities: extremities normal, atraumatic, no cyanosis or edema Neurologic: Alert and oriented X 3, normal strength and tone. Normal symmetric reflexes. Normal coordination and gait  ECOG PERFORMANCE STATUS: 1 -  Symptomatic but completely ambulatory  Blood pressure 103/68, pulse 100, temperature 98.7 F (37.1 C), temperature source Oral, resp. rate 18, height '5\' 3"'$  (1.6 m), weight 191 lb 1.6 oz (86.682 kg), SpO2 100 %.  LABORATORY DATA: Lab Results  Component Value Date   WBC 3.9 09/11/2015   HGB 9.7* 09/11/2015   HCT 28.8* 09/11/2015   MCV 89.4 09/11/2015   PLT 138* 09/11/2015      Chemistry      Component Value Date/Time   NA 141 08/21/2015 0824   NA 132* 08/09/2015 0121   K 3.3* 08/21/2015 0824   K 3.1* 08/09/2015 0121   CL 93* 08/09/2015 0121   CO2 23 08/21/2015 0824   CO2 25 07/25/2015 0639   BUN 4.2* 08/21/2015 0824   BUN 4* 08/09/2015 0121   CREATININE 0.7 08/21/2015 0824   CREATININE 0.50 08/09/2015 0121      Component Value Date/Time   CALCIUM 8.8 08/21/2015 0824   CALCIUM 8.2* 07/25/2015 0639   ALKPHOS 141 08/21/2015 0824   ALKPHOS 104 07/23/2015 0745   AST 16 08/21/2015 0824   AST 37 07/23/2015  0745   ALT 12 08/21/2015 0824   ALT 20 07/23/2015 0745   BILITOT <0.30 08/21/2015 0824   BILITOT 0.7 07/23/2015 0745       RADIOGRAPHIC STUDIES: Ct Chest W Contrast  09/10/2015  CLINICAL DATA:  Lung cancer, right lung, restaging. Ongoing radiation therapy. EXAM: CT CHEST WITH CONTRAST TECHNIQUE: Multidetector CT imaging of the chest was performed during intravenous contrast administration. CONTRAST:  37m OMNIPAQUE IOHEXOL 300 MG/ML  SOLN COMPARISON:  PET 07/12/2015 and CT chest 06/19/2015. FINDINGS: Mediastinum/Nodes: Right IJ Port-A-Cath terminates in the right atrium. Subcarinal lymph node measures 1.8 cm, previously 3.0 cm. Mediastinal and hilar lymph nodes are otherwise within normal limits by CT size criteria, which represents an improvement from the prior exam. No axillary adenopathy. Coronary artery calcification. Heart size normal. No pericardial effusion. Thickening of the distal esophageal wall can be seen with gastroesophageal reflux disease. Lungs/Pleura: Mild scattered residual peribronchovascular ground-glass and nodularity, likely postinfectious/postinflammatory etiology. Right lower lobe nodule measures approximately 8 mm (series 4, image 43), previously 14 mm. Previously questioned 6 mm nodule along the right hemidiaphragm is not readily appreciated. No pleural fluid. Airway is unremarkable. Upper abdomen: Visualized portions of the liver, adrenal glands, kidneys, spleen, pancreas and stomach are grossly unremarkable. Cholecystectomy. No upper abdominal adenopathy. Musculoskeletal: No worrisome lytic or sclerotic lesions. Degenerative changes are seen in the spine. IMPRESSION: 1. Interval response to therapy as evidenced by marked improvement in mediastinal/hilar adenopathy as well as decrease in size of primary right lower lobe nodule. 2. A 6 mm nodule along the right hemidiaphragm, previously questioned is new on 07/12/2015, is not readily appreciated. 3. Coronary artery calcification.  Electronically Signed   By: MLorin PicketM.D.   On: 09/10/2015 16:43    ASSESSMENT AND PLAN: This is a very pleasant 55years old white female recently diagnosed with limited stage small cell lung cancer and currently undergoing a course of systemic chemotherapy with carboplatin and etoposide in addition to concurrent radiation. She status post 2 cycles of chemotherapy and tolerated the second cycle of her treatment well. The recent CT scan of the chest showed improvement of her disease. I discussed the scan results and showed the images to the patient and her sister. I recommended for the patient to proceed with cycle #3 today as scheduled.  For the hyponatremia, this significantly improved. For pain management, she  would continue her treatment with Percocet. The patient would come back for follow-up visit in 3 weeks for reevaluation before starting the next cycle of her treatment. She was advised to call immediately if she has any concerning symptoms in the interval. The patient voices understanding of current disease status and treatment options and is in agreement with the current care plan.  All questions were answered. The patient knows to call the clinic with any problems, questions or concerns. We can certainly see the patient much sooner if necessary.  Disclaimer: This note was dictated with voice recognition software. Similar sounding words can inadvertently be transcribed and may not be corrected upon review.

## 2015-09-11 NOTE — Telephone Encounter (Signed)
Appointments complete per 1/31 pof. Patient sister to get copy of avs report/appointments in inf area.

## 2015-09-11 NOTE — Patient Instructions (Signed)
[facility] Discharge Instructions for Patients Receiving Chemotherapy  Today you received the following chemotherapy agents: Carboplatin and Etoposide.  To help prevent nausea and vomiting after your treatment, we encourage you to take your nausea medication:Compazine 10 mg every 6 hours as needed; Phenergan 12.5 mg every 6 hours as needed.   If you develop nausea and vomiting that is not controlled by your nausea medication, call the clinic.   BELOW ARE SYMPTOMS THAT SHOULD BE REPORTED IMMEDIATELY:  *FEVER GREATER THAN 100.5 F  *CHILLS WITH OR WITHOUT FEVER  NAUSEA AND VOMITING THAT IS NOT CONTROLLED WITH YOUR NAUSEA MEDICATION  *UNUSUAL SHORTNESS OF BREATH  *UNUSUAL BRUISING OR BLEEDING  TENDERNESS IN MOUTH AND THROAT WITH OR WITHOUT PRESENCE OF ULCERS  *URINARY PROBLEMS  *BOWEL PROBLEMS  UNUSUAL RASH Items with * indicate a potential emergency and should be followed up as soon as possible.  Feel free to call the clinic you have any questions or concerns. The clinic phone number is [phone].  Please show the [facility] at check-in to the Emergency Department and triage nurse.

## 2015-09-11 NOTE — Progress Notes (Signed)
Nutrition follow-up completed with patient during chemotherapy for limited stage small cell lung cancer. Patient reports her appetite is fine and she is consuming a regular diet  She is taking two nausea medications, which improved nausea.   Weight documented as 191.1 pounds January 31 decreased from 198.2 pounds January 10.   Patient denies nutrition impact symptoms at this time.  Nutrition diagnosis: Unintended weight loss continues.  Intervention:  Patient was educated to continue adequate calories and protein throughout the day to promote weight maintenance.  Teach back method was used   Monitoring, evaluation, goals: Patient will continue to try to consume adequate calories and protein to minimize weight loss   Next visit: Tuesday, February 21 during infusion.  **Disclaimer: This note was dictated with voice recognition software. Similar sounding words can inadvertently be transcribed and this note may contain transcription errors which may not have been corrected upon publication of note.**

## 2015-09-12 ENCOUNTER — Ambulatory Visit
Admission: RE | Admit: 2015-09-12 | Discharge: 2015-09-12 | Disposition: A | Discharge status: Home / Self Care | Payer: Medicaid Other | Source: Ambulatory Visit | Attending: Radiation Oncology | Admitting: Radiation Oncology

## 2015-09-12 ENCOUNTER — Ambulatory Visit (HOSPITAL_BASED_OUTPATIENT_CLINIC_OR_DEPARTMENT_OTHER): Payer: Medicaid Other

## 2015-09-12 VITALS — BP 108/71 | HR 118 | Temp 98.0°F | Resp 16

## 2015-09-12 DIAGNOSIS — Z5111 Encounter for antineoplastic chemotherapy: Secondary | ICD-10-CM

## 2015-09-12 DIAGNOSIS — C3431 Malignant neoplasm of lower lobe, right bronchus or lung: Secondary | ICD-10-CM | POA: Diagnosis not present

## 2015-09-12 DIAGNOSIS — Z510 Encounter for antineoplastic radiation therapy: Secondary | ICD-10-CM | POA: Diagnosis not present

## 2015-09-12 MED ORDER — SODIUM CHLORIDE 0.9 % IJ SOLN
10.0000 mL | INTRAMUSCULAR | Status: DC | PRN
  Administered 2015-09-12: 10 mL
  Filled 2015-09-12: qty 10

## 2015-09-12 MED ORDER — SODIUM CHLORIDE 0.9 % IV SOLN
Freq: Once | INTRAVENOUS | Status: AC
  Administered 2015-09-12: 10:00:00 via INTRAVENOUS

## 2015-09-12 MED ORDER — SODIUM CHLORIDE 0.9 % IV SOLN
Freq: Once | INTRAVENOUS | Status: AC
  Administered 2015-09-12: 11:00:00 via INTRAVENOUS
  Filled 2015-09-12: qty 4

## 2015-09-12 MED ORDER — HEPARIN SOD (PORK) LOCK FLUSH 100 UNIT/ML IV SOLN
500.0000 [IU] | Freq: Once | INTRAVENOUS | Status: AC | PRN
  Administered 2015-09-12: 500 [IU]
  Filled 2015-09-12: qty 5

## 2015-09-12 MED ORDER — SODIUM CHLORIDE 0.9 % IV SOLN
120.0000 mg/m2 | Freq: Once | INTRAVENOUS | Status: AC
  Administered 2015-09-12: 260 mg via INTRAVENOUS
  Filled 2015-09-12: qty 13

## 2015-09-12 NOTE — Patient Instructions (Signed)
[facility] Discharge Instructions for Patients Receiving Chemotherapy  Today you received the following chemotherapy agents: Etoposide.  To help prevent nausea and vomiting after your treatment, we encourage you to take your nausea medication Phenergan 12.5 mg every 6 hours as needed; Compazine 10 mg every 6 hours as needed.   If you develop nausea and vomiting that is not controlled by your nausea medication, call the clinic.   BELOW ARE SYMPTOMS THAT SHOULD BE REPORTED IMMEDIATELY:  *FEVER GREATER THAN 100.5 F  *CHILLS WITH OR WITHOUT FEVER  NAUSEA AND VOMITING THAT IS NOT CONTROLLED WITH YOUR NAUSEA MEDICATION  *UNUSUAL SHORTNESS OF BREATH  *UNUSUAL BRUISING OR BLEEDING  TENDERNESS IN MOUTH AND THROAT WITH OR WITHOUT PRESENCE OF ULCERS  *URINARY PROBLEMS  *BOWEL PROBLEMS  UNUSUAL RASH Items with * indicate a potential emergency and should be followed up as soon as possible.  Feel free to call the clinic you have any questions or concerns. The clinic phone number is [phone].  Please show the [facility] at check-in to the Emergency Department and triage nurse.

## 2015-09-13 ENCOUNTER — Ambulatory Visit
Admission: RE | Admit: 2015-09-13 | Discharge: 2015-09-13 | Disposition: A | Discharge status: Home / Self Care | Payer: Medicaid Other | Source: Ambulatory Visit | Attending: Radiation Oncology | Admitting: Radiation Oncology

## 2015-09-13 ENCOUNTER — Encounter: Payer: Self-pay | Admitting: Radiation Oncology

## 2015-09-13 ENCOUNTER — Ambulatory Visit (HOSPITAL_BASED_OUTPATIENT_CLINIC_OR_DEPARTMENT_OTHER): Payer: Medicaid Other

## 2015-09-13 VITALS — BP 124/64 | HR 111 | Temp 97.0°F

## 2015-09-13 DIAGNOSIS — C3431 Malignant neoplasm of lower lobe, right bronchus or lung: Secondary | ICD-10-CM

## 2015-09-13 DIAGNOSIS — Z510 Encounter for antineoplastic radiation therapy: Secondary | ICD-10-CM | POA: Diagnosis not present

## 2015-09-13 DIAGNOSIS — Z5111 Encounter for antineoplastic chemotherapy: Secondary | ICD-10-CM | POA: Diagnosis present

## 2015-09-13 MED ORDER — SODIUM CHLORIDE 0.9 % IV SOLN
Freq: Once | INTRAVENOUS | Status: AC
  Administered 2015-09-13: 10:00:00 via INTRAVENOUS

## 2015-09-13 MED ORDER — SODIUM CHLORIDE 0.9 % IJ SOLN
10.0000 mL | INTRAMUSCULAR | Status: DC | PRN
  Administered 2015-09-13: 10 mL
  Filled 2015-09-13: qty 10

## 2015-09-13 MED ORDER — ALUM & MAG HYDROXIDE-SIMETH 200-200-20 MG/5ML PO SUSP
15.0000 mL | Freq: Once | ORAL | Status: AC
  Administered 2015-09-13: 15 mL via ORAL
  Filled 2015-09-13: qty 30

## 2015-09-13 MED ORDER — SODIUM CHLORIDE 0.9 % IV SOLN
500.0000 mL | Freq: Once | INTRAVENOUS | Status: AC
  Administered 2015-09-13: 10:00:00 via INTRAVENOUS

## 2015-09-13 MED ORDER — SODIUM CHLORIDE 0.9 % IV SOLN
Freq: Once | INTRAVENOUS | Status: AC
  Administered 2015-09-13: 10:00:00 via INTRAVENOUS
  Filled 2015-09-13: qty 4

## 2015-09-13 MED ORDER — HEPARIN SOD (PORK) LOCK FLUSH 100 UNIT/ML IV SOLN
500.0000 [IU] | Freq: Once | INTRAVENOUS | Status: AC | PRN
  Administered 2015-09-13: 500 [IU]
  Filled 2015-09-13: qty 5

## 2015-09-13 MED ORDER — SODIUM CHLORIDE 0.9 % IV SOLN
120.0000 mg/m2 | Freq: Once | INTRAVENOUS | Status: AC
  Administered 2015-09-13: 260 mg via INTRAVENOUS
  Filled 2015-09-13: qty 13

## 2015-09-13 NOTE — Progress Notes (Signed)
Pt c/o dizziness with standing.  BP 100/51 HR 109.  NS 585m given per Dr MJulien Nordmann Pt c/o indigestion and difficulty / painful swallowing.  Maalox per CSelena Lesser NP

## 2015-09-13 NOTE — Patient Instructions (Signed)
[facility] Discharge Instructions for Patients Receiving Chemotherapy  Today you received the following chemotherapy agents Etoposide.   To help prevent nausea and vomiting after your treatment, we encourage you to take your nausea medication as prescribed.   If you develop nausea and vomiting that is not controlled by your nausea medication, call the clinic.   BELOW ARE SYMPTOMS THAT SHOULD BE REPORTED IMMEDIATELY:  *FEVER GREATER THAN 100.5 F  *CHILLS WITH OR WITHOUT FEVER  NAUSEA AND VOMITING THAT IS NOT CONTROLLED WITH YOUR NAUSEA MEDICATION  *UNUSUAL SHORTNESS OF BREATH  *UNUSUAL BRUISING OR BLEEDING  TENDERNESS IN MOUTH AND THROAT WITH OR WITHOUT PRESENCE OF ULCERS  *URINARY PROBLEMS  *BOWEL PROBLEMS  UNUSUAL RASH Items with * indicate a potential emergency and should be followed up as soon as possible.  Feel free to call the clinic you have any questions or concerns. The clinic phone number is [phone].  Please show the CHEMO ALERT CARD at check-in to the Emergency Department and triage nurse.   

## 2015-09-14 ENCOUNTER — Ambulatory Visit: Payer: Medicaid Other

## 2015-09-14 ENCOUNTER — Ambulatory Visit
Admission: RE | Admit: 2015-09-14 | Discharge: 2015-09-14 | Disposition: A | Discharge status: Home / Self Care | Payer: Medicaid Other | Source: Ambulatory Visit | Attending: Radiation Oncology | Admitting: Radiation Oncology

## 2015-09-14 ENCOUNTER — Ambulatory Visit: Payer: Medicaid Other | Admitting: Radiation Oncology

## 2015-09-15 ENCOUNTER — Ambulatory Visit (HOSPITAL_BASED_OUTPATIENT_CLINIC_OR_DEPARTMENT_OTHER): Payer: Medicaid Other

## 2015-09-15 VITALS — BP 129/92 | HR 116 | Temp 98.5°F | Resp 16

## 2015-09-15 DIAGNOSIS — Z5189 Encounter for other specified aftercare: Secondary | ICD-10-CM

## 2015-09-15 DIAGNOSIS — C3431 Malignant neoplasm of lower lobe, right bronchus or lung: Secondary | ICD-10-CM

## 2015-09-15 DIAGNOSIS — Z5111 Encounter for antineoplastic chemotherapy: Secondary | ICD-10-CM

## 2015-09-15 MED ORDER — PEGFILGRASTIM INJECTION 6 MG/0.6ML ~~LOC~~
6.0000 mg | PREFILLED_SYRINGE | Freq: Once | SUBCUTANEOUS | Status: AC
  Administered 2015-09-15: 6 mg via SUBCUTANEOUS
  Filled 2015-09-15: qty 0.6

## 2015-09-15 NOTE — Patient Instructions (Signed)
Pegfilgrastim injection What is this medicine? PEGFILGRASTIM (PEG fil gra stim) is a long-acting granulocyte colony-stimulating factor that stimulates the growth of neutrophils, a type of white blood cell important in the body's fight against infection. It is used to reduce the incidence of fever and infection in patients with certain types of cancer who are receiving chemotherapy that affects the bone marrow, and to increase survival after being exposed to high doses of radiation. This medicine may be used for other purposes; ask your health care provider or pharmacist if you have questions. What should I tell my health care provider before I take this medicine? They need to know if you have any of these conditions: -kidney disease -latex allergy -ongoing radiation therapy -sickle cell disease -skin reactions to acrylic adhesives (On-Body Injector only) -an unusual or allergic reaction to pegfilgrastim, filgrastim, other medicines, foods, dyes, or preservatives -pregnant or trying to get pregnant -breast-feeding How should I use this medicine? This medicine is for injection under the skin. If you get this medicine at home, you will be taught how to prepare and give the pre-filled syringe or how to use the On-body Injector. Refer to the patient Instructions for Use for detailed instructions. Use exactly as directed. Take your medicine at regular intervals. Do not take your medicine more often than directed. It is important that you put your used needles and syringes in a special sharps container. Do not put them in a trash can. If you do not have a sharps container, call your pharmacist or healthcare provider to get one. Talk to your pediatrician regarding the use of this medicine in children. While this drug may be prescribed for selected conditions, precautions do apply. Overdosage: If you think you have taken too much of this medicine contact a poison control center or emergency room at  once. NOTE: This medicine is only for you. Do not share this medicine with others. What if I miss a dose? It is important not to miss your dose. Call your doctor or health care professional if you miss your dose. If you miss a dose due to an On-body Injector failure or leakage, a new dose should be administered as soon as possible using a single prefilled syringe for manual use. What may interact with this medicine? Interactions have not been studied. Give your health care provider a list of all the medicines, herbs, non-prescription drugs, or dietary supplements you use. Also tell them if you smoke, drink alcohol, or use illegal drugs. Some items may interact with your medicine. This list may not describe all possible interactions. Give your health care provider a list of all the medicines, herbs, non-prescription drugs, or dietary supplements you use. Also tell them if you smoke, drink alcohol, or use illegal drugs. Some items may interact with your medicine. What should I watch for while using this medicine? You may need blood work done while you are taking this medicine. If you are going to need a MRI, CT scan, or other procedure, tell your doctor that you are using this medicine (On-Body Injector only). What side effects may I notice from receiving this medicine? Side effects that you should report to your doctor or health care professional as soon as possible: -allergic reactions like skin rash, itching or hives, swelling of the face, lips, or tongue -dizziness -fever -pain, redness, or irritation at site where injected -pinpoint red spots on the skin -red or dark-brown urine -shortness of breath or breathing problems -stomach or side pain, or pain   at the shoulder -swelling -tiredness -trouble passing urine or change in the amount of urine Side effects that usually do not require medical attention (report to your doctor or health care professional if they continue or are  bothersome): -bone pain -muscle pain This list may not describe all possible side effects. Call your doctor for medical advice about side effects. You may report side effects to FDA at 1-800-FDA-1088. Where should I keep my medicine? Keep out of the reach of children. Store pre-filled syringes in a refrigerator between 2 and 8 degrees C (36 and 46 degrees F). Do not freeze. Keep in carton to protect from light. Throw away this medicine if it is left out of the refrigerator for more than 48 hours. Throw away any unused medicine after the expiration date. NOTE: This sheet is a summary. It may not cover all possible information. If you have questions about this medicine, talk to your doctor, pharmacist, or health care provider.    2016, [employer]. (2014-08-17 14:30:14)  

## 2015-09-16 ENCOUNTER — Encounter (HOSPITAL_COMMUNITY): Payer: Self-pay | Admitting: Emergency Medicine

## 2015-09-16 ENCOUNTER — Other Ambulatory Visit (HOSPITAL_COMMUNITY): Payer: Self-pay

## 2015-09-16 ENCOUNTER — Other Ambulatory Visit: Payer: Self-pay

## 2015-09-16 ENCOUNTER — Emergency Department (HOSPITAL_COMMUNITY): Payer: Medicaid Other

## 2015-09-16 ENCOUNTER — Inpatient Hospital Stay (HOSPITAL_COMMUNITY)
Admission: EM | Admit: 2015-09-16 | Discharge: 2015-10-10 | DRG: 391 | Disposition: E | Payer: Medicaid Other | Attending: Internal Medicine | Admitting: Internal Medicine

## 2015-09-16 DIAGNOSIS — T451X5A Adverse effect of antineoplastic and immunosuppressive drugs, initial encounter: Secondary | ICD-10-CM

## 2015-09-16 DIAGNOSIS — C3490 Malignant neoplasm of unspecified part of unspecified bronchus or lung: Secondary | ICD-10-CM | POA: Insufficient documentation

## 2015-09-16 DIAGNOSIS — E441 Mild protein-calorie malnutrition: Secondary | ICD-10-CM | POA: Diagnosis present

## 2015-09-16 DIAGNOSIS — Z87891 Personal history of nicotine dependence: Secondary | ICD-10-CM

## 2015-09-16 DIAGNOSIS — R140 Abdominal distension (gaseous): Secondary | ICD-10-CM

## 2015-09-16 DIAGNOSIS — R112 Nausea with vomiting, unspecified: Principal | ICD-10-CM | POA: Diagnosis present

## 2015-09-16 DIAGNOSIS — K567 Ileus, unspecified: Secondary | ICD-10-CM | POA: Diagnosis present

## 2015-09-16 DIAGNOSIS — E876 Hypokalemia: Secondary | ICD-10-CM | POA: Diagnosis not present

## 2015-09-16 DIAGNOSIS — G8929 Other chronic pain: Secondary | ICD-10-CM | POA: Diagnosis present

## 2015-09-16 DIAGNOSIS — Z7982 Long term (current) use of aspirin: Secondary | ICD-10-CM

## 2015-09-16 DIAGNOSIS — J449 Chronic obstructive pulmonary disease, unspecified: Secondary | ICD-10-CM | POA: Diagnosis not present

## 2015-09-16 DIAGNOSIS — E785 Hyperlipidemia, unspecified: Secondary | ICD-10-CM | POA: Diagnosis present

## 2015-09-16 DIAGNOSIS — Z794 Long term (current) use of insulin: Secondary | ICD-10-CM

## 2015-09-16 DIAGNOSIS — L658 Other specified nonscarring hair loss: Secondary | ICD-10-CM | POA: Diagnosis present

## 2015-09-16 DIAGNOSIS — R5081 Fever presenting with conditions classified elsewhere: Secondary | ICD-10-CM | POA: Diagnosis present

## 2015-09-16 DIAGNOSIS — K219 Gastro-esophageal reflux disease without esophagitis: Secondary | ICD-10-CM | POA: Diagnosis present

## 2015-09-16 DIAGNOSIS — Z79899 Other long term (current) drug therapy: Secondary | ICD-10-CM

## 2015-09-16 DIAGNOSIS — R0600 Dyspnea, unspecified: Secondary | ICD-10-CM

## 2015-09-16 DIAGNOSIS — D6959 Other secondary thrombocytopenia: Secondary | ICD-10-CM | POA: Diagnosis present

## 2015-09-16 DIAGNOSIS — Z833 Family history of diabetes mellitus: Secondary | ICD-10-CM

## 2015-09-16 DIAGNOSIS — E1165 Type 2 diabetes mellitus with hyperglycemia: Secondary | ICD-10-CM | POA: Diagnosis not present

## 2015-09-16 DIAGNOSIS — F319 Bipolar disorder, unspecified: Secondary | ICD-10-CM | POA: Diagnosis present

## 2015-09-16 DIAGNOSIS — M549 Dorsalgia, unspecified: Secondary | ICD-10-CM | POA: Diagnosis present

## 2015-09-16 DIAGNOSIS — D61810 Antineoplastic chemotherapy induced pancytopenia: Secondary | ICD-10-CM | POA: Diagnosis present

## 2015-09-16 DIAGNOSIS — E119 Type 2 diabetes mellitus without complications: Secondary | ICD-10-CM | POA: Diagnosis present

## 2015-09-16 DIAGNOSIS — R001 Bradycardia, unspecified: Secondary | ICD-10-CM | POA: Diagnosis present

## 2015-09-16 DIAGNOSIS — Z803 Family history of malignant neoplasm of breast: Secondary | ICD-10-CM

## 2015-09-16 DIAGNOSIS — IMO0002 Reserved for concepts with insufficient information to code with codable children: Secondary | ICD-10-CM | POA: Diagnosis present

## 2015-09-16 DIAGNOSIS — R197 Diarrhea, unspecified: Secondary | ICD-10-CM

## 2015-09-16 HISTORY — DX: Malignant neoplasm of unspecified part of unspecified bronchus or lung: C34.90

## 2015-09-16 LAB — COMPREHENSIVE METABOLIC PANEL
ALT: 10 U/L — ABNORMAL LOW (ref 14–54)
AST: 16 U/L (ref 15–41)
Albumin: 3.3 g/dL — ABNORMAL LOW (ref 3.5–5.0)
Alkaline Phosphatase: 82 U/L (ref 38–126)
Anion gap: 11 (ref 5–15)
BUN: 10 mg/dL (ref 6–20)
CO2: 21 mmol/L — ABNORMAL LOW (ref 22–32)
Calcium: 9 mg/dL (ref 8.9–10.3)
Chloride: 104 mmol/L (ref 101–111)
Creatinine, Ser: 0.48 mg/dL (ref 0.44–1.00)
GFR calc Af Amer: >60 mL/min (ref >60)
GFR calc non Af Amer: >60 mL/min (ref >60)
Glucose, Bld: 158 mg/dL — ABNORMAL HIGH (ref 65–99)
Potassium: 2.9 mmol/L — ABNORMAL LOW (ref 3.5–5.1)
Sodium: 136 mmol/L (ref 135–145)
Total Bilirubin: 0.7 mg/dL (ref 0.3–1.2)
Total Protein: 6.1 g/dL — ABNORMAL LOW (ref 6.5–8.1)

## 2015-09-16 LAB — CBC WITH DIFFERENTIAL/PLATELET
Basophils Absolute: 0 10*3/uL (ref 0.0–0.1)
Basophils Relative: 0 %
Eosinophils Absolute: 0 10*3/uL (ref 0.0–0.7)
Eosinophils Relative: 0 %
HCT: 27.4 % — ABNORMAL LOW (ref 36.0–46.0)
Hemoglobin: 9.8 g/dL — ABNORMAL LOW (ref 12.0–15.0)
Lymphocytes Relative: 6 %
Lymphs Abs: 0.5 10*3/uL — ABNORMAL LOW (ref 0.7–4.0)
MCH: 30.9 pg (ref 26.0–34.0)
MCHC: 35.8 g/dL (ref 30.0–36.0)
MCV: 86.4 fL (ref 78.0–100.0)
Monocytes Absolute: 0 10*3/uL — ABNORMAL LOW (ref 0.1–1.0)
Monocytes Relative: 0 %
Neutro Abs: 7.6 10*3/uL (ref 1.7–7.7)
Neutrophils Relative %: 94 %
Platelets: 185 10*3/uL (ref 150–400)
RBC: 3.17 MIL/uL — ABNORMAL LOW (ref 3.87–5.11)
RDW: 17.9 % — ABNORMAL HIGH (ref 11.5–15.5)
WBC: 8.2 10*3/uL (ref 4.0–10.5)

## 2015-09-16 LAB — URINE MICROSCOPIC-ADD ON
Bacteria, UA: NONE SEEN
RBC / HPF: NONE SEEN RBC/hpf (ref 0–5)

## 2015-09-16 LAB — GLUCOSE, CAPILLARY: Glucose-Capillary: 127 mg/dL — ABNORMAL HIGH (ref 65–99)

## 2015-09-16 LAB — URINALYSIS, ROUTINE W REFLEX MICROSCOPIC
Bilirubin Urine: NEGATIVE
Glucose, UA: NEGATIVE mg/dL
Hgb urine dipstick: NEGATIVE
Ketones, ur: NEGATIVE mg/dL
Leukocytes, UA: NEGATIVE
Nitrite: NEGATIVE
Protein, ur: 100 mg/dL — AB
Specific Gravity, Urine: 1.02 (ref 1.005–1.030)
pH: 6.5 (ref 5.0–8.0)

## 2015-09-16 LAB — I-STAT CG4 LACTIC ACID, ED
Lactic Acid, Venous: 1.39 mmol/L (ref 0.5–2.0)
Lactic Acid, Venous: 1.05 mmol/L (ref 0.5–2.0)

## 2015-09-16 LAB — PHOSPHORUS: Phosphorus: 4.4 mg/dL (ref 2.5–4.6)

## 2015-09-16 LAB — MAGNESIUM: Magnesium: 1.4 mg/dL — ABNORMAL LOW (ref 1.7–2.4)

## 2015-09-16 MED ORDER — TIOTROPIUM BROMIDE MONOHYDRATE 18 MCG IN CAPS
18.0000 ug | ORAL_CAPSULE | Freq: Every day | RESPIRATORY_TRACT | Status: DC
  Administered 2015-09-17 – 2015-09-23 (×6): 18 ug via RESPIRATORY_TRACT
  Filled 2015-09-16 (×2): qty 5

## 2015-09-16 MED ORDER — SIMVASTATIN 40 MG PO TABS
40.0000 mg | ORAL_TABLET | Freq: Every day | ORAL | Status: DC
  Administered 2015-09-23: 40 mg via ORAL
  Filled 2015-09-16 (×5): qty 1

## 2015-09-16 MED ORDER — QUETIAPINE FUMARATE ER 400 MG PO TB24
800.0000 mg | ORAL_TABLET | Freq: Every day | ORAL | Status: DC
  Administered 2015-09-16 – 2015-09-23 (×2): 800 mg via ORAL
  Filled 2015-09-16 (×9): qty 2

## 2015-09-16 MED ORDER — ONDANSETRON HCL 4 MG/2ML IJ SOLN
4.0000 mg | Freq: Once | INTRAMUSCULAR | Status: AC
  Administered 2015-09-16: 4 mg via INTRAVENOUS
  Filled 2015-09-16: qty 2

## 2015-09-16 MED ORDER — POTASSIUM CHLORIDE CRYS ER 20 MEQ PO TBCR
40.0000 meq | EXTENDED_RELEASE_TABLET | Freq: Two times a day (BID) | ORAL | Status: DC
  Administered 2015-09-16: 40 meq via ORAL
  Filled 2015-09-16 (×4): qty 2

## 2015-09-16 MED ORDER — MORPHINE SULFATE (PF) 2 MG/ML IV SOLN
2.0000 mg | INTRAVENOUS | Status: DC | PRN
  Administered 2015-09-16 – 2015-09-17 (×3): 2 mg via INTRAVENOUS
  Filled 2015-09-16 (×3): qty 1

## 2015-09-16 MED ORDER — SODIUM CHLORIDE 0.9 % IV SOLN
1000.0000 mL | Freq: Once | INTRAVENOUS | Status: AC
  Administered 2015-09-16: 1000 mL via INTRAVENOUS

## 2015-09-16 MED ORDER — SUCRALFATE 1 GM/10ML PO SUSP
1.0000 g | Freq: Three times a day (TID) | ORAL | Status: DC
  Administered 2015-09-16 – 2015-09-23 (×4): 1 g via ORAL
  Filled 2015-09-16 (×21): qty 10

## 2015-09-16 MED ORDER — ACETAMINOPHEN 650 MG RE SUPP: 650.0000 mg | Freq: Four times a day (QID) | RECTAL | Status: DC | PRN

## 2015-09-16 MED ORDER — PROCHLORPERAZINE EDISYLATE 5 MG/ML IJ SOLN
10.0000 mg | Freq: Four times a day (QID) | INTRAMUSCULAR | Status: DC | PRN
  Administered 2015-09-18 – 2015-09-24 (×13): 10 mg via INTRAVENOUS
  Filled 2015-09-16 (×13): qty 2

## 2015-09-16 MED ORDER — LORAZEPAM 2 MG/ML IJ SOLN
1.0000 mg | Freq: Once | INTRAMUSCULAR | Status: AC
  Administered 2015-09-16: 1 mg via INTRAVENOUS
  Filled 2015-09-16: qty 1

## 2015-09-16 MED ORDER — HEPARIN SODIUM (PORCINE) 5000 UNIT/ML IJ SOLN
5000.0000 [IU] | Freq: Three times a day (TID) | INTRAMUSCULAR | Status: DC
  Administered 2015-09-16 – 2015-09-19 (×7): 5000 [IU] via SUBCUTANEOUS
  Filled 2015-09-16 (×7): qty 1

## 2015-09-16 MED ORDER — IPRATROPIUM-ALBUTEROL 0.5-2.5 (3) MG/3ML IN SOLN: 3.0000 mL | Freq: Four times a day (QID) | RESPIRATORY_TRACT | Status: DC | PRN

## 2015-09-16 MED ORDER — PANTOPRAZOLE SODIUM 40 MG PO TBEC
40.0000 mg | DELAYED_RELEASE_TABLET | Freq: Every day | ORAL | Status: DC
  Administered 2015-09-17: 40 mg via ORAL
  Filled 2015-09-16 (×2): qty 1

## 2015-09-16 MED ORDER — SODIUM CHLORIDE 0.9 % IV SOLN
INTRAVENOUS | Status: DC
  Administered 2015-09-16: 21:00:00 via INTRAVENOUS
  Administered 2015-09-17 – 2015-09-18 (×2): 1000 mL via INTRAVENOUS

## 2015-09-16 MED ORDER — BUDESONIDE-FORMOTEROL FUMARATE 80-4.5 MCG/ACT IN AERO
2.0000 | INHALATION_SPRAY | Freq: Two times a day (BID) | RESPIRATORY_TRACT | Status: DC
  Administered 2015-09-16 – 2015-09-24 (×11): 2 via RESPIRATORY_TRACT
  Filled 2015-09-16: qty 6.9

## 2015-09-16 MED ORDER — ONDANSETRON HCL 4 MG/2ML IJ SOLN
4.0000 mg | Freq: Four times a day (QID) | INTRAMUSCULAR | Status: DC | PRN
  Administered 2015-09-16 – 2015-09-19 (×8): 4 mg via INTRAVENOUS
  Filled 2015-09-16 (×7): qty 2

## 2015-09-16 MED ORDER — VITAMIN D3 25 MCG (1000 UNIT) PO TABS
1000.0000 [IU] | ORAL_TABLET | Freq: Every day | ORAL | Status: DC
  Administered 2015-09-17 – 2015-09-23 (×3): 1000 [IU] via ORAL
  Filled 2015-09-16 (×13): qty 1

## 2015-09-16 MED ORDER — LAMOTRIGINE 25 MG PO TABS
125.0000 mg | ORAL_TABLET | Freq: Two times a day (BID) | ORAL | Status: DC
  Administered 2015-09-16 – 2015-09-23 (×8): 125 mg via ORAL
  Filled 2015-09-16 (×19): qty 1

## 2015-09-16 MED ORDER — POTASSIUM CHLORIDE 10 MEQ/100ML IV SOLN
10.0000 meq | Freq: Once | INTRAVENOUS | Status: AC
  Administered 2015-09-16: 10 meq via INTRAVENOUS
  Filled 2015-09-16: qty 100

## 2015-09-16 MED ORDER — ONDANSETRON HCL 4 MG PO TABS: 4.0000 mg | ORAL_TABLET | Freq: Four times a day (QID) | ORAL | Status: DC | PRN

## 2015-09-16 MED ORDER — INSULIN ASPART 100 UNIT/ML ~~LOC~~ SOLN
0.0000 [IU] | Freq: Three times a day (TID) | SUBCUTANEOUS | Status: DC
  Administered 2015-09-17 – 2015-09-18 (×6): 1 [IU] via SUBCUTANEOUS
  Administered 2015-09-19 – 2015-09-20 (×5): 2 [IU] via SUBCUTANEOUS
  Administered 2015-09-20: 5 [IU] via SUBCUTANEOUS
  Administered 2015-09-21: 1 [IU] via SUBCUTANEOUS
  Administered 2015-09-21 – 2015-09-22 (×5): 2 [IU] via SUBCUTANEOUS
  Administered 2015-09-23 – 2015-09-24 (×4): 1 [IU] via SUBCUTANEOUS

## 2015-09-16 MED ORDER — MORPHINE SULFATE (PF) 4 MG/ML IV SOLN
4.0000 mg | Freq: Once | INTRAVENOUS | Status: AC
  Administered 2015-09-16: 4 mg via INTRAVENOUS
  Filled 2015-09-16: qty 1

## 2015-09-16 MED ORDER — POTASSIUM CHLORIDE 10 MEQ/100ML IV SOLN
10.0000 meq | INTRAVENOUS | Status: AC
  Administered 2015-09-16 (×4): 10 meq via INTRAVENOUS
  Filled 2015-09-16 (×4): qty 100

## 2015-09-16 MED ORDER — PROMETHAZINE HCL 25 MG RE SUPP
12.5000 mg | Freq: Four times a day (QID) | RECTAL | Status: DC | PRN
  Administered 2015-09-19: 12.5 mg via RECTAL
  Filled 2015-09-16: qty 1

## 2015-09-16 MED ORDER — INSULIN GLARGINE 100 UNIT/ML ~~LOC~~ SOLN
5.0000 [IU] | Freq: Every day | SUBCUTANEOUS | Status: DC
  Administered 2015-09-16 – 2015-09-23 (×7): 5 [IU] via SUBCUTANEOUS
  Filled 2015-09-16 (×11): qty 0.05

## 2015-09-16 MED ORDER — ACETAMINOPHEN 325 MG PO TABS
650.0000 mg | ORAL_TABLET | Freq: Four times a day (QID) | ORAL | Status: DC | PRN
  Administered 2015-09-22: 650 mg via ORAL

## 2015-09-16 MED ORDER — ZOLPIDEM TARTRATE 5 MG PO TABS
5.0000 mg | ORAL_TABLET | Freq: Every evening | ORAL | Status: DC | PRN
Start: 2015-09-16 — End: 2015-09-25
  Administered 2015-09-21 – 2015-09-23 (×3): 5 mg via ORAL
  Filled 2015-09-16 (×4): qty 1

## 2015-09-16 MED ORDER — MAGNESIUM OXIDE 400 (241.3 MG) MG PO TABS
800.0000 mg | ORAL_TABLET | Freq: Three times a day (TID) | ORAL | Status: DC
  Administered 2015-09-16 – 2015-09-17 (×4): 800 mg via ORAL
  Filled 2015-09-16 (×12): qty 2

## 2015-09-16 MED ORDER — OXYCODONE-ACETAMINOPHEN 5-325 MG PO TABS: 1.0000 | ORAL_TABLET | Freq: Three times a day (TID) | ORAL | Status: DC | PRN

## 2015-09-16 MED ORDER — NIACIN ER 500 MG PO CPCR
750.0000 mg | ORAL_CAPSULE | Freq: Every day | ORAL | Status: DC
  Filled 2015-09-16 (×2): qty 1

## 2015-09-16 MED ORDER — ALBUTEROL SULFATE (2.5 MG/3ML) 0.083% IN NEBU: 2.5000 mg | INHALATION_SOLUTION | RESPIRATORY_TRACT | Status: DC | PRN

## 2015-09-16 MED ORDER — CALCIUM CARBONATE-VITAMIN D 500-200 MG-UNIT PO TABS
1.0000 | ORAL_TABLET | Freq: Two times a day (BID) | ORAL | Status: DC
  Administered 2015-09-16 – 2015-09-23 (×6): 1 via ORAL
  Filled 2015-09-16 (×14): qty 1

## 2015-09-16 MED ORDER — GABAPENTIN 300 MG PO CAPS
600.0000 mg | ORAL_CAPSULE | Freq: Three times a day (TID) | ORAL | Status: DC
  Administered 2015-09-17 – 2015-09-23 (×11): 600 mg via ORAL
  Filled 2015-09-16 (×21): qty 2

## 2015-09-16 NOTE — ED Notes (Signed)
Pt assisted to bathroom.  Missed urine hat.

## 2015-09-16 NOTE — ED Notes (Signed)
Patient transported to X-ray 

## 2015-09-16 NOTE — ED Notes (Signed)
Pt given 2 popsicles over the last hour.  No nausea reported

## 2015-09-16 NOTE — ED Notes (Addendum)
Pt from [facility]".  Lung CA Dx.  Had chemo Tx Thursday. Beginnng Friday onset of N/V/D this has continued through today.  Zofran IM '4mg'$  and '1000mg'$  Tylenol PO PTA Pt did vomit after.  101.7 temp 116P  Hx DM with CBG 172

## 2015-09-16 NOTE — ED Notes (Signed)
Pt aware of need for urine sample.  In to much pain to try.  Will give pain meds 10 mins to work and then attempt.

## 2015-09-16 NOTE — H&P (Signed)
Patient Demographics  Sarah Leonard, is a 55 y.o. female  MRN: [id number]   DOB - 07-10-1961  Admit Date - 10/08/2015  Outpatient Primary MD for the patient is Moreen Fowler   With History of -  Past Medical History  Diagnosis Date  . Pancreatitis   . Anemia   . Bipolar 1 disorder ([place])   . Alcohol abuse   . 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 COPD (chronic obstructive pulmonary disease) ([place])   . Hypotension   . Major depressive disorder ([place])   . Cocaine abuse   . Chronic back pain   . Renal disorder   . Acidosis   . Hyperkalemia   . Abscess   . Acute encephalopathy   . Septic shock ([place])   . Chemotherapy induced neutropenia ([place]) 08/08/2015  . Diabetes mellitus   . Lung cancer [facility])       Past Surgical History  Procedure Laterality Date  . Cholecystectomy    . Appendectomy    . Tonsillectomy    . Incision and drainage abscess Right 03/01/2013    Procedure: INCISION AND DRAINAGE ABSCESS;  Surgeon: Harl Bowie, MD;  Location: [place];  Service: General;  Laterality: Right;  . Cesarean section    . Video bronchoscopy with endobronchial ultrasound N/A 06/21/2015    Procedure: VIDEO BRONCHOSCOPY WITH ENDOBRONCHIAL ULTRASOUND;  Surgeon: Javier Glazier, MD;  Location: [place];  Service: Thoracic;  Laterality: N/A;  . Flexible bronchoscopy N/A 06/21/2015    Procedure: BRONCHOSCOPY;  Surgeon: Javier Glazier, MD;  Location: [place];  Service: Thoracic;  Laterality: N/A;    in for   Chief Complaint  Patient presents with  . Nausea  . Emesis     HPI  Sarah Leonard  is a 55 y.o. female, with recent diagnosis of small cell lung cancer, COPD, depression, diabetes mellitus, hyperlipidemia, pancreatitis, bipolar disorder, history of tobacco, alcohol, and cocaine abuse in the past. Presents with complaints of nausea vomiting diarrhea over the last 48 hours, most recent chemotherapy with Dr. Earlie Server last Thursday, since then patient  been feeling weak, and poor appetite, with significant nausea vomiting and diarrhea, as reports of fever 101.7 by EMS, but upon presentation patient was afebrile, labs were significant for hypokalemia, otherwise no neutropenia, no leukocytosis, no fever, negative urinalysis, chest x-ray with no active disease, but this requested to admit for IV hydration, and management of her.    Review of Systems    In addition to the HPI above,  No Fever-chills, No Headache, No changes with Vision or hearing, No problems swallowing food or Liquids, No Chest pain, Cough or Shortness of Breath, Denies Abdominal pain, but complains of nausea, vomiting, and diarrhea  No Blood in stool or Urine, No dysuria, No new skin rashes or bruises, Complaints of generalized body ache No new weakness, tingling, numbness in any extremity, Reports 50 pound weight loss since diagnosis of cancer and chemotherapy No polyuria, polydypsia or polyphagia, No significant Mental Stressors.  A full 10 point Review of Systems was done, except as stated above, all other Review of Systems were negative.   Social History Social History  Substance Use Topics  . Smoking status: Former Smoker -- 0.00 packs/day    Quit date: 07/15/2015  . Smokeless tobacco: Never Used  . Alcohol Use: No     Comment: hx of etoh abuse     Family History Family History  Problem  Relation Age of Onset  . Diabetes Mother   . Cancer Mother     breast     Prior to Admission medications   Medication Sig Start Date End Date Taking? Authorizing Provider  acetaminophen (TYLENOL) 325 MG tablet Take 325 mg by mouth 3 (three) times daily. Prior to fish oil   Yes Historical Provider, MD  albuterol (PROVENTIL HFA;VENTOLIN HFA) 108 (90 BASE) MCG/ACT inhaler Inhale 2 puffs into the lungs every 4 (four) hours as needed for wheezing or shortness of breath. 53/2/99  Yes Delora Fuel, MD  aspirin EC 81 MG tablet Take 1 tablet (81 mg total) by mouth daily.  06/25/15  Yes Velvet Bathe, MD  budesonide-formoterol (SYMBICORT) 80-4.5 MCG/ACT inhaler Inhale 2 puffs into the lungs 2 (two) times daily.   Yes Historical Provider, MD  calcium-vitamin D (OSCAL WITH D) 500-200 MG-UNIT tablet Take 1 tablet by mouth 2 (two) times daily.   Yes Historical Provider, MD  cholecalciferol (VITAMIN D) 1000 UNITS tablet Take 1,000 Units by mouth daily.   Yes Historical Provider, MD  docusate sodium (COLACE) 100 MG capsule Take 100 mg by mouth 2 (two) times daily.   Yes Historical Provider, MD  gabapentin (NEURONTIN) 300 MG capsule Take 600 mg by mouth 3 (three) times daily.   Yes Historical Provider, MD  insulin aspart (NOVOLOG) 100 UNIT/ML injection Inject 5 Units into the skin 3 (three) times daily with meals. 06/11/15  Yes Orson Eva, MD  insulin glargine (LANTUS) 100 UNIT/ML injection Inject 0.1 mLs (10 Units total) into the skin at bedtime. 06/11/15  Yes David Tat, MD  ipratropium-albuterol (DUONEB) 0.5-2.5 (3) MG/3ML SOLN Take 3 mLs by nebulization every 6 (six) hours as needed. Patient taking differently: Take 3 mLs by nebulization every 6 (six) hours as needed (Shortness of breath).  06/11/15  Yes Orson Eva, MD  lamoTRIgine (LAMICTAL) 100 MG tablet Take 125 mg by mouth 2 (two) times daily. Take with 25 mg tablet   Yes Historical Provider, MD  lamoTRIgine (LAMICTAL) 25 MG tablet Take 125 mg by mouth 2 (two) times daily. Takes with '100mg'$  tablet   Yes Historical Provider, MD  lidocaine-prilocaine (EMLA) cream Apply 1 application topically as needed. Apply to port site 1-1.5 hours prior to chemotherapy appt 08/21/15  Yes Curt Bears, MD  magnesium oxide (MAG-OX) 400 (241.3 MG) MG tablet Take 2 tablets (800 mg total) by mouth 3 (three) times daily. 07/25/15  Yes Reyne Dumas, MD  niacin (NIASPAN) 750 MG CR tablet Take 750 mg by mouth at bedtime.   Yes Historical Provider, MD  omeprazole (PRILOSEC) 20 MG capsule Take 20 mg by mouth daily before supper.    Yes Historical  Provider, MD  oxyCODONE-acetaminophen (PERCOCET/ROXICET) 5-325 MG tablet Take 1 tablet by mouth every 8 (eight) hours as needed for severe pain. 09/03/15  Yes Kyung Rudd, MD  potassium chloride 20 MEQ TBCR Take 40 mEq by mouth 2 (two) times daily. 07/26/15  Yes Reyne Dumas, MD  prochlorperazine (COMPAZINE) 10 MG tablet Take 1 tablet (10 mg total) by mouth every 6 (six) hours as needed for nausea or vomiting. 08/14/15  Yes Curt Bears, MD  promethazine (PHENERGAN) 12.5 MG tablet Take 12.5 mg by mouth every 6 (six) hours as needed for nausea or vomiting.   Yes Historical Provider, MD  QUEtiapine (SEROQUEL XR) 400 MG 24 hr tablet Take 800 mg by mouth at bedtime.   Yes Historical Provider, MD  simvastatin (ZOCOR) 40 MG tablet Take 40 mg by mouth  daily at 6 PM.    Yes Historical Provider, MD  sucralfate (CARAFATE) 1 GM/10ML suspension Take 10 mLs (1 g total) by mouth 4 (four) times daily -  with meals and at bedtime. 08/29/15  Yes Kyung Rudd, MD  tiotropium (SPIRIVA) 18 MCG inhalation capsule Place 18 mcg into inhaler and inhale daily.   Yes Historical Provider, MD  zolpidem (AMBIEN) 5 MG tablet Take 5 mg by mouth at bedtime as needed for sleep.   Yes Historical Provider, MD    No Known Allergies  Physical Exam  Vitals  Blood pressure 124/79, pulse 103, temperature 98.2 F (36.8 C), temperature source Oral, resp. rate 16, SpO2 100 %.   1. General frail female lying in bed in NAD,    2. Normal affect and insight, Not Suicidal or Homicidal, Awake Alert, Oriented X 3.  3. No F.N deficits, ALL C.Nerves Intact, Strength 5/5 all 4 extremities, Sensation intact all 4 extremities, Plantars down going.  4. Ears and Eyes appear Normal, Conjunctivae clear, PERRLA. Dry oral mucosa Oral Mucosa, has significant alopecia  5. Supple Neck, No JVD, No Carotid Bruits.  6. Symmetrical Chest wall movement, Good air movement bilaterally, CTAB.  7. RRR, No Gallops, Rubs or Murmurs, No Parasternal Heave.  8.  Positive Bowel Sounds, Abdomen Soft, No tenderness, No organomegaly appriciated,No rebound -guarding or rigidity.  9.  No Cyanosis, Normal Skin Turgor, No Skin Rash or Bruise.  10. Good muscle tone,  joints appear normal , no effusions, Normal ROM.   Data Review  CBC  Recent Labs Lab 09/11/15 0828 09/20/2015 1217  WBC 3.9 8.2  HGB 9.7* 9.8*  HCT 28.8* 27.4*  PLT 138* 185  MCV 89.4 86.4  MCH 30.1 30.9  MCHC 33.7 35.8  RDW 18.3* 17.9*  LYMPHSABS 0.3* 0.5*  MONOABS 0.3 0.0*  EOSABS 0.0 0.0  BASOSABS 0.0 0.0   ------------------------------------------------------------------------------------------------------------------  Chemistries   Recent Labs Lab 09/11/15 0828 09/19/2015 1217  NA 136 136  K 3.5 2.9*  CL  --  104  CO2 24 21*  GLUCOSE 246* 158*  BUN 8.4 10  CREATININE 0.8 0.48  CALCIUM 9.8 9.0  AST 11 16  ALT 13 10*  ALKPHOS 112 82  BILITOT 0.30 0.7   ------------------------------------------------------------------------------------------------------------------ estimated creatinine clearance is 82.9 mL/min (by C-G formula based on Cr of 0.48). ------------------------------------------------------------------------------------------------------------------ No results for input(s): TSH, T4TOTAL, T3FREE, THYROIDAB in the last 72 hours.  Invalid input(s): FREET3   Coagulation profile No results for input(s): INR, PROTIME in the last 168 hours. ------------------------------------------------------------------------------------------------------------------- No results for input(s): DDIMER in the last 72 hours. -------------------------------------------------------------------------------------------------------------------  Cardiac Enzymes No results for input(s): CKMB, TROPONINI, MYOGLOBIN in the last 168 hours.  Invalid input(s):  CK ------------------------------------------------------------------------------------------------------------------ Invalid input(s): POCBNP   ---------------------------------------------------------------------------------------------------------------  Urinalysis    Component Value Date/Time   COLORURINE YELLOW 09/14/2015 [place] 09/29/2015 1408   LABSPEC 1.020 09/25/2015 1408   PHURINE 6.5 09/17/2015 1408   GLUCOSEU NEGATIVE 09/26/2015 1408   HGBUR NEGATIVE 10/01/2015 1408   BILIRUBINUR NEGATIVE 10/03/2015 1408   KETONESUR NEGATIVE 09/28/2015 1408   PROTEINUR 100* 10/03/2015 1408   UROBILINOGEN 0.2 06/06/2015 1643   NITRITE NEGATIVE 09/21/2015 1408   LEUKOCYTESUR NEGATIVE 10/09/2015 1408    ----------------------------------------------------------------------------------------------------------------  Imaging results:   Dg Chest 2 View  09/27/2015  CLINICAL DATA:  Lung cancer with chemotherapy.  Fever EXAM: CHEST  2 VIEW COMPARISON:  08/09/2015 FINDINGS: Lungs are clear without evidence of pneumonia. No heart failure or effusion.  Port-A-Cath tip in the SVC. IMPRESSION: No active cardiopulmonary disease. Electronically Signed   By: Franchot Gallo M.D.   On: 09/13/2015 13:13        Assessment & Plan  Active Problems:   COPD (chronic obstructive pulmonary disease) (HCC)   Hyperlipidemia   GERD (gastroesophageal reflux disease)   Nausea & vomiting   Hypokalemia    Intractable nausea and vomiting - This is secondary to chemotherapy, will start her on when necessary Zofran, Phenergan, Compazine, will continue with IV fluids, will keep her on full liquid diet and advance as tolerated.  Lung cancer - Followed by Dr. Earlie Server, she is on chemotherapy, ED physician discussed with oncology on call, will notify Dr. Earlie Server in a.m..  COPD - No active wheezing, continue with home medication including Symbicort and Spiriva, on when necessary  DuoNeb  Diabetes mellitus - will start on SSI, will decrease Lantus dose by half as due to poor appetite  GERD - Continue with PPI  Hyperlipidemia - Continue with statin  Hypokalemia - Replete with IV supplementation, recheck in a.m., will add potassium and magnesium to current labs and replace if needed.  Bipolar depression and depression -  Stable. No suicidal or homicidal ideations. - Continue quetiapine and lamotrigine  DVT Prophylaxis Heparin  AM Labs Ordered, also please review Full Orders  Family Communication: Admission, patients condition and plan of care including tests being ordered have been discussed with the patient  who indicate understanding and agree with the plan and Code Status.  Code Status Full  Likely DC to ALF  Condition GUARDED    Time spent in minutes : 50 minutes    Lelani Garnett M.D on 09/15/2015 at 4:34 PM  Between 7am to 7pm - Pager - [phone]  After 7pm go to [email or web address] - password [email or web address]  And look for the night coverage person covering me after hours  [facility] Office  [phone]

## 2015-09-16 NOTE — ED Provider Notes (Signed)
CSN: [id number]     Arrival date & time 09/30/2015  1115 History   First MD Initiated Contact with Patient 09/14/2015 1121     Chief Complaint  Patient presents with  . Nausea  . Emesis   HPI   55 YOF with a history of small cell lung cancer presents today with numerous complaints. Pt is currently under the care of Dr. Julien Nordmann nad Dr. Tammi Klippel. Most recently seen on 09/11/2015. Pt is receiving  Carboplatin, etoposide, and neulast; status post 2 cycles; concurrent radiotherapy.   Cycle three started on 09/11/15- most recent infusions  09/15/15- neulesta 09/13/15- etoposide 09/12/15- etoposide 09/11/15- carboplatin/ etoposide   Pt presents today tearful with complaints of weakness, fever, abdominal pain, vomiting, and sore throat. Patient reports symptoms have been present for the last 4 days with numerous episodes of vomiting and diarrhea. Patient reports that she does have antinausea medication at home, but due to significant distress from chemotherapy therapy, and present illness patient did not take the medication. She reports that she "does not want to go to this anymore" and does not feel she can handle current illness on her own.    Past Medical History  Diagnosis Date  . Pancreatitis   . Anemia   . Bipolar 1 disorder ([place])   . Alcohol abuse   . 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 COPD (chronic obstructive pulmonary disease) ([place])   . Hypotension   . Major depressive disorder ([place])   . Cocaine abuse   . Chronic back pain   . Renal disorder   . Acidosis   . Hyperkalemia   . Abscess   . Acute encephalopathy   . Septic shock ([place])   . Chemotherapy induced neutropenia ([place]) 08/08/2015  . Diabetes mellitus   . Lung cancer [facility])    Past Surgical History  Procedure Laterality Date  . Cholecystectomy    . Appendectomy    . Tonsillectomy    . Incision and drainage abscess Right 03/01/2013    Procedure: INCISION AND DRAINAGE ABSCESS;  Surgeon: Harl Bowie,  MD;  Location: [place];  Service: General;  Laterality: Right;  . Cesarean section    . Video bronchoscopy with endobronchial ultrasound N/A 06/21/2015    Procedure: VIDEO BRONCHOSCOPY WITH ENDOBRONCHIAL ULTRASOUND;  Surgeon: Javier Glazier, MD;  Location: [place];  Service: Thoracic;  Laterality: N/A;  . Flexible bronchoscopy N/A 06/21/2015    Procedure: BRONCHOSCOPY;  Surgeon: Javier Glazier, MD;  Location: [place];  Service: Thoracic;  Laterality: N/A;   Family History  Problem Relation Age of Onset  . Diabetes Mother   . Cancer Mother     breast   Social History  Substance Use Topics  . Smoking status: Former Smoker -- 0.00 packs/day    Quit date: 07/15/2015  . Smokeless tobacco: Never Used  . Alcohol Use: No     Comment: hx of etoh abuse   OB History    No data available     Review of Systems  All other systems reviewed and are negative.   Allergies  Review of patient's allergies indicates no known allergies.  Home Medications   Prior to Admission medications   Medication Sig Start Date End Date Taking? Authorizing Provider  acetaminophen (TYLENOL) 325 MG tablet Take 325 mg by mouth 3 (three) times daily. Prior to fish oil   Yes Historical Provider, MD  albuterol (PROVENTIL HFA;VENTOLIN HFA) 108 (90 BASE) MCG/ACT inhaler Inhale  2 puffs into the lungs every 4 (four) hours as needed for wheezing or shortness of breath. 04/13/80  Yes Delora Fuel, MD  aspirin EC 81 MG tablet Take 1 tablet (81 mg total) by mouth daily. 06/25/15  Yes Velvet Bathe, MD  budesonide-formoterol (SYMBICORT) 80-4.5 MCG/ACT inhaler Inhale 2 puffs into the lungs 2 (two) times daily.   Yes Historical Provider, MD  calcium-vitamin D (OSCAL WITH D) 500-200 MG-UNIT tablet Take 1 tablet by mouth 2 (two) times daily.   Yes Historical Provider, MD  cholecalciferol (VITAMIN D) 1000 UNITS tablet Take 1,000 Units by mouth daily.   Yes Historical Provider, MD  docusate sodium (COLACE) 100 MG capsule Take 100 mg  by mouth 2 (two) times daily.   Yes Historical Provider, MD  gabapentin (NEURONTIN) 300 MG capsule Take 600 mg by mouth 3 (three) times daily.   Yes Historical Provider, MD  insulin aspart (NOVOLOG) 100 UNIT/ML injection Inject 5 Units into the skin 3 (three) times daily with meals. 06/11/15  Yes Orson Eva, MD  insulin glargine (LANTUS) 100 UNIT/ML injection Inject 0.1 mLs (10 Units total) into the skin at bedtime. 06/11/15  Yes David Tat, MD  ipratropium-albuterol (DUONEB) 0.5-2.5 (3) MG/3ML SOLN Take 3 mLs by nebulization every 6 (six) hours as needed. Patient taking differently: Take 3 mLs by nebulization every 6 (six) hours as needed (Shortness of breath).  06/11/15  Yes Orson Eva, MD  lamoTRIgine (LAMICTAL) 100 MG tablet Take 125 mg by mouth 2 (two) times daily. Take with 25 mg tablet   Yes Historical Provider, MD  lamoTRIgine (LAMICTAL) 25 MG tablet Take 125 mg by mouth 2 (two) times daily. Takes with '100mg'$  tablet   Yes Historical Provider, MD  lidocaine-prilocaine (EMLA) cream Apply 1 application topically as needed. Apply to port site 1-1.5 hours prior to chemotherapy appt 08/21/15  Yes Curt Bears, MD  magnesium oxide (MAG-OX) 400 (241.3 MG) MG tablet Take 2 tablets (800 mg total) by mouth 3 (three) times daily. 07/25/15  Yes Reyne Dumas, MD  niacin (NIASPAN) 750 MG CR tablet Take 750 mg by mouth at bedtime.   Yes Historical Provider, MD  omeprazole (PRILOSEC) 20 MG capsule Take 20 mg by mouth daily before supper.    Yes Historical Provider, MD  oxyCODONE-acetaminophen (PERCOCET/ROXICET) 5-325 MG tablet Take 1 tablet by mouth every 8 (eight) hours as needed for severe pain. 09/03/15  Yes Kyung Rudd, MD  potassium chloride 20 MEQ TBCR Take 40 mEq by mouth 2 (two) times daily. 07/26/15  Yes Reyne Dumas, MD  prochlorperazine (COMPAZINE) 10 MG tablet Take 1 tablet (10 mg total) by mouth every 6 (six) hours as needed for nausea or vomiting. 08/14/15  Yes Curt Bears, MD  promethazine  (PHENERGAN) 12.5 MG tablet Take 12.5 mg by mouth every 6 (six) hours as needed for nausea or vomiting.   Yes Historical Provider, MD  QUEtiapine (SEROQUEL XR) 400 MG 24 hr tablet Take 800 mg by mouth at bedtime.   Yes Historical Provider, MD  simvastatin (ZOCOR) 40 MG tablet Take 40 mg by mouth daily at 6 PM.    Yes Historical Provider, MD  sucralfate (CARAFATE) 1 GM/10ML suspension Take 10 mLs (1 g total) by mouth 4 (four) times daily -  with meals and at bedtime. 08/29/15  Yes Kyung Rudd, MD  tiotropium (SPIRIVA) 18 MCG inhalation capsule Place 18 mcg into inhaler and inhale daily.   Yes Historical Provider, MD  zolpidem (AMBIEN) 5 MG tablet Take 5 mg by  mouth at bedtime as needed for sleep.   Yes Historical Provider, MD    BP 124/79 mmHg  Pulse 103  Temp(Src) 98.2 F (36.8 C) (Oral)  Resp 16  SpO2 100%    Physical Exam  Constitutional: She is oriented to person, place, and time. She appears well-developed and well-nourished.  HENT:  Head: Normocephalic and atraumatic.  Eyes: Conjunctivae are normal. Pupils are equal, round, and reactive to light. Right eye exhibits no discharge. Left eye exhibits no discharge. No scleral icterus.  Neck: Normal range of motion. No JVD present. No tracheal deviation present.  Cardiovascular: Regular rhythm, normal heart sounds and intact distal pulses.  Exam reveals no gallop and no friction rub.   No murmur heard. Pulmonary/Chest: Effort normal and breath sounds normal. No stridor. No respiratory distress. She has no wheezes. She has no rales. She exhibits no tenderness.  Abdominal: She exhibits no distension and no mass. There is tenderness. There is no rebound and no guarding.  Diffuse TTP, no peritonitic signs    Musculoskeletal: Normal range of motion. She exhibits no edema or tenderness.  Neurological: She is alert and oriented to person, place, and time. Coordination normal.  Skin: Skin is warm and dry. No rash noted. No erythema. No pallor.   Psychiatric: She has a normal mood and affect. Her behavior is normal. Judgment and thought content normal.  Nursing note and vitals reviewed.   ED Course  Procedures (including critical care time) Labs Review Labs Reviewed  COMPREHENSIVE METABOLIC PANEL - Abnormal; Notable for the following:    Potassium 2.9 (*)    CO2 21 (*)    Glucose, Bld 158 (*)    Total Protein 6.1 (*)    Albumin 3.3 (*)    ALT 10 (*)    All other components within normal limits  URINALYSIS, ROUTINE W REFLEX MICROSCOPIC (NOT AT [facility]) - Abnormal; Notable for the following:    Protein, ur 100 (*)    All other components within normal limits  CBC WITH DIFFERENTIAL/PLATELET - Abnormal; Notable for the following:    RBC 3.17 (*)    Hemoglobin 9.8 (*)    HCT 27.4 (*)    RDW 17.9 (*)    Lymphs Abs 0.5 (*)    Monocytes Absolute 0.0 (*)    All other components within normal limits  URINE MICROSCOPIC-ADD ON - Abnormal; Notable for the following:    Squamous Epithelial / LPF 0-5 (*)    All other components within normal limits  CULTURE, BLOOD (ROUTINE X 2)  CULTURE, BLOOD (ROUTINE X 2)  URINE CULTURE  MAGNESIUM  PHOSPHORUS  I-STAT CG4 LACTIC ACID, ED  I-STAT CG4 LACTIC ACID, ED    Imaging Review Dg Chest 2 View  09/26/2015  CLINICAL DATA:  Lung cancer with chemotherapy.  Fever EXAM: CHEST  2 VIEW COMPARISON:  08/09/2015 FINDINGS: Lungs are clear without evidence of pneumonia. No heart failure or effusion. Port-A-Cath tip in the SVC. IMPRESSION: No active cardiopulmonary disease. Electronically Signed   By: Franchot Gallo M.D.   On: 10/06/2015 13:13   I have personally reviewed and evaluated these images and lab results as part of my medical decision-making.   EKG Interpretation None      MDM   Final diagnoses:  Non-intractable vomiting with nausea, vomiting of unspecified type  Malignant neoplasm of lung, unspecified laterality, unspecified part of lung ([place])    Labs: I-STAT lactic acid,  urinalysis, urine culture, urine microscopic, blood culture, CMP, CBC- potassium 2.9  Imaging: DG chest 2 view no active cardiopulmonary disease  Consults: Hospitalist service, oncology  Therapeutics: Zofran, normal saline, morphine  Discharge Meds:   Assessment/Plan: A 55 year old female with active malignancy on chemotherapy radiation presents with nausea vomiting and diarrhea. Patient received Zofran prior to arrival with emesis, she was given Zofran and pain medication normal saline and potassium here in the ED. Patient continues to be nauseous, but has been able to tolerate popsicles here in the ED. Patient has chest pain, scribe the central,(likely due to her chemotherapy and associated throat pain. Her chest x-ray was normal no signs of infectious etiology.  Patient has abdominal pain, this is diffuse, with no rebound or guarding, nonsurgical abdomen. Patient is tenderness to palpation of the back diffusely, she has no loss of distal sensation strength or motor function. Patient's symptoms likely due to chemotherapy/radiation. Patient has no focal findings of infection, with a normal WBC, afebrile, with reassuring vital signs. Due to patient's significant past medical history, persistent nausea and vomiting prior to arrival, and question of fever patient will be admitted to hospitalist service for further evaluation and management.         Okey Regal, PA-C 09/17/2015 [place], MD 09/18/15 [phone]

## 2015-09-16 NOTE — ED Notes (Signed)
RN spoke to Pt sister [name] to update her on Pts room number

## 2015-09-16 NOTE — ED Notes (Signed)
Pt requests RN to contact her sister, [name].  RN Contacted her and updated her on status of Pt at [phone].

## 2015-09-17 ENCOUNTER — Ambulatory Visit: Payer: Medicaid Other

## 2015-09-17 ENCOUNTER — Telehealth: Payer: Self-pay | Admitting: *Deleted

## 2015-09-17 DIAGNOSIS — K219 Gastro-esophageal reflux disease without esophagitis: Secondary | ICD-10-CM | POA: Diagnosis not present

## 2015-09-17 DIAGNOSIS — E1165 Type 2 diabetes mellitus with hyperglycemia: Secondary | ICD-10-CM | POA: Diagnosis not present

## 2015-09-17 DIAGNOSIS — J449 Chronic obstructive pulmonary disease, unspecified: Secondary | ICD-10-CM | POA: Diagnosis not present

## 2015-09-17 DIAGNOSIS — E876 Hypokalemia: Secondary | ICD-10-CM | POA: Diagnosis not present

## 2015-09-17 LAB — BASIC METABOLIC PANEL
ANION GAP: 7 (ref 5–15)
BUN: 8 mg/dL (ref 6–20)
CO2: 21 mmol/L — AB (ref 22–32)
Calcium: 8.4 mg/dL — ABNORMAL LOW (ref 8.9–10.3)
Chloride: 110 mmol/L (ref 101–111)
Creatinine, Ser: 0.45 mg/dL (ref 0.44–1.00)
GFR calc Af Amer: >60 mL/min (ref >60)
GFR calc non Af Amer: >60 mL/min (ref >60)
GLUCOSE: 144 mg/dL — AB (ref 65–99)
POTASSIUM: 3.2 mmol/L — AB (ref 3.5–5.1)
Sodium: 138 mmol/L (ref 135–145)

## 2015-09-17 LAB — CBC
HEMATOCRIT: 23.8 % — AB (ref 36.0–46.0)
HEMOGLOBIN: 8.2 g/dL — AB (ref 12.0–15.0)
MCH: 30.7 pg (ref 26.0–34.0)
MCHC: 34.5 g/dL (ref 30.0–36.0)
MCV: 89.1 fL (ref 78.0–100.0)
Platelets: 119 10*3/uL — ABNORMAL LOW (ref 150–400)
RBC: 2.67 MIL/uL — ABNORMAL LOW (ref 3.87–5.11)
RDW: 18.3 % — ABNORMAL HIGH (ref 11.5–15.5)
WBC: 2 10*3/uL — ABNORMAL LOW (ref 4.0–10.5)

## 2015-09-17 LAB — URINE CULTURE: Culture: NO GROWTH

## 2015-09-17 LAB — GLUCOSE, CAPILLARY
GLUCOSE-CAPILLARY: 130 mg/dL — AB (ref 65–99)
Glucose-Capillary: 138 mg/dL — ABNORMAL HIGH (ref 65–99)
Glucose-Capillary: 136 mg/dL — ABNORMAL HIGH (ref 65–99)
Glucose-Capillary: 119 mg/dL — ABNORMAL HIGH (ref 65–99)

## 2015-09-17 MED ORDER — NIACIN ER 500 MG PO CPCR
750.0000 mg | ORAL_CAPSULE | Freq: Every day | ORAL | Status: DC
  Administered 2015-09-23: 750 mg via ORAL
  Filled 2015-09-17 (×8): qty 1

## 2015-09-17 MED ORDER — POTASSIUM CHLORIDE 20 MEQ/15ML (10%) PO SOLN
40.0000 meq | Freq: Two times a day (BID) | ORAL | Status: DC
  Administered 2015-09-17: 40 meq via ORAL
  Filled 2015-09-17 (×3): qty 30

## 2015-09-17 MED ORDER — MORPHINE SULFATE (PF) 2 MG/ML IV SOLN
1.0000 mg | Freq: Four times a day (QID) | INTRAVENOUS | Status: DC | PRN
  Administered 2015-09-17 (×2): 1 mg via INTRAVENOUS
  Filled 2015-09-17 (×2): qty 1

## 2015-09-17 MED ORDER — CETYLPYRIDINIUM CHLORIDE 0.05 % MT LIQD
7.0000 mL | Freq: Two times a day (BID) | OROMUCOSAL | Status: DC
  Administered 2015-09-17 – 2015-09-24 (×11): 7 mL via OROMUCOSAL

## 2015-09-17 MED ORDER — OXYCODONE-ACETAMINOPHEN 5-325 MG PO TABS
1.0000 | ORAL_TABLET | Freq: Four times a day (QID) | ORAL | Status: DC | PRN
  Administered 2015-09-17 (×2): 1 via ORAL
  Filled 2015-09-17 (×2): qty 1

## 2015-09-17 MED ORDER — ENSURE ENLIVE PO LIQD
237.0000 mL | Freq: Two times a day (BID) | ORAL | Status: DC
  Administered 2015-09-17 – 2015-09-18 (×2): 237 mL via ORAL

## 2015-09-17 NOTE — Progress Notes (Signed)
Patient Demographics  Sarah Leonard, is a 54 y.o. female, DOB - 1961-06-09, TIW:[id number]  Admit date - 09/14/2015   Admitting Physician Albertine Patricia, MD  Outpatient Primary MD for the patient is Moreen Fowler  LOS -    Chief Complaint  Patient presents with  . Nausea  . Emesis         Subjective:   Sarah Leonard today has, No headache, and planes of pain all over, nausea, but no vomiting, a febrile.  Assessment & Plan    Active Problems:   COPD (chronic obstructive pulmonary disease) (HCC)   Hyperlipidemia   GERD (gastroesophageal reflux disease)   Nausea & vomiting   Hypokalemia  Intractable nausea and vomiting - This is secondary to chemotherapy, and he with when necessary nausea medication , continue with IV fluids , will advance to carb modified diet today .  Lung cancer - She is on chemotherapy, discussed with Dr. Earlie Server, as well on radiation therapy, will go for radiation this afternoon.  COPD - No active wheezing, continue with home medication including Symbicort and Spiriva, on when necessary DuoNeb  Diabetes mellitus - Acceptable on current dose Lantus, on SSI  GERD - Continue with PPI  Hyperlipidemia - Continue with statin  Hypokalemia - Repleted, recheck in a.m.  Bipolar depression and depression - Stable. No suicidal or homicidal ideations. - Continue quetiapine and lamotrigine  Pancytopenia - Patient with leukopenia, anemia, thrombocytopenia, this is secondary to immunotherapy, no indication for transfusion, no evidence of infection.  Code Status: Full  Family Communication: Discussed with patient  Disposition Plan: Pending PT consult   Procedures  None   Consults   Discussed with oncology via phone   Medications  Scheduled Meds: . antiseptic oral rinse  7 mL Mouth Rinse BID  . budesonide-formoterol  2 puff Inhalation BID  .  calcium-vitamin D  1 tablet Oral BID  . cholecalciferol  1,000 Units Oral Daily  . feeding supplement (ENSURE ENLIVE)  237 mL Oral BID BM  . gabapentin  600 mg Oral TID  . heparin  5,000 Units Subcutaneous 3 times per day  . insulin aspart  0-9 Units Subcutaneous TID WC  . insulin glargine  5 Units Subcutaneous QHS  . lamoTRIgine  125 mg Oral BID  . magnesium oxide  800 mg Oral TID  . niacin 750 mg  750 mg Oral QHS  . pantoprazole  40 mg Oral Daily  . potassium chloride  40 mEq Oral BID  . QUEtiapine  800 mg Oral QHS  . simvastatin  40 mg Oral q1800  . sucralfate  1 g Oral TID WC & HS  . tiotropium  18 mcg Inhalation Daily   Continuous Infusions: . sodium chloride 1,000 mL (09/17/15 0747)   PRN Meds:.acetaminophen **OR** acetaminophen, albuterol, ipratropium-albuterol, morphine injection, ondansetron **OR** ondansetron (ZOFRAN) IV, oxyCODONE-acetaminophen, prochlorperazine, promethazine, zolpidem  DVT Prophylaxis Heparin   Lab Results  Component Value Date   PLT 119* 09/17/2015    Antibiotics    Anti-infectives    None          Objective:   Filed Vitals:   10/08/2015 1758 09/15/2015 2016 09/17/15 0438 09/17/15 1304  BP: 126/80 117/71 101/54 133/87  Pulse: 113 96 117  107  Temp: 98.6 F (37 C) 98.3 F (36.8 C) 98 F (36.7 C) 98.5 F (36.9 C)  TempSrc: Oral Oral Oral Oral  Resp: '16 16 16 16  '$ SpO2: 98% 94% 99% 98%    Wt Readings from Last 3 Encounters:  09/11/15 86.682 kg (191 lb 1.6 oz)  09/07/15 85.367 kg (188 lb 3.2 oz)  08/31/15 85.73 kg (189 lb)     Intake/Output Summary (Last 24 hours) at 09/17/15 1319 Last data filed at 09/17/15 0438  Gross per 24 hour  Intake   1482 ml  Output      0 ml  Net   1482 ml     Physical Exam  Awake Alert, Oriented X 3,Alopecia+ [place].AT,PERRAL Supple Neck,No JVD, Symmetrical Chest wall movement, Good air movement bilaterally RRR,No Gallops,Rubs or new Murmurs, No Parasternal Heave +ve B.Sounds, Abd Soft, No  tenderness,No rebound - guarding or rigidity. No Cyanosis, Clubbing or edema, No new Rash or bruise    Data Review   Micro Results No results found for this or any previous visit (from the past 240 hour(s)).  Radiology Reports Dg Chest 2 View  09/19/2015  CLINICAL DATA:  Lung cancer with chemotherapy.  Fever EXAM: CHEST  2 VIEW COMPARISON:  08/09/2015 FINDINGS: Lungs are clear without evidence of pneumonia. No heart failure or effusion. Port-A-Cath tip in the SVC. IMPRESSION: No active cardiopulmonary disease. Electronically Signed   By: Franchot Gallo M.D.   On: 09/14/2015 13:13   Ct Chest W Contrast  09/10/2015  CLINICAL DATA:  Lung cancer, right lung, restaging. Ongoing radiation therapy. EXAM: CT CHEST WITH CONTRAST TECHNIQUE: Multidetector CT imaging of the chest was performed during intravenous contrast administration. CONTRAST:  73m OMNIPAQUE IOHEXOL 300 MG/ML  SOLN COMPARISON:  PET 07/12/2015 and CT chest 06/19/2015. FINDINGS: Mediastinum/Nodes: Right IJ Port-A-Cath terminates in the right atrium. Subcarinal lymph node measures 1.8 cm, previously 3.0 cm. Mediastinal and hilar lymph nodes are otherwise within normal limits by CT size criteria, which represents an improvement from the prior exam. No axillary adenopathy. Coronary artery calcification. Heart size normal. No pericardial effusion. Thickening of the distal esophageal wall can be seen with gastroesophageal reflux disease. Lungs/Pleura: Mild scattered residual peribronchovascular ground-glass and nodularity, likely postinfectious/postinflammatory etiology. Right lower lobe nodule measures approximately 8 mm (series 4, image 43), previously 14 mm. Previously questioned 6 mm nodule along the right hemidiaphragm is not readily appreciated. No pleural fluid. Airway is unremarkable. Upper abdomen: Visualized portions of the liver, adrenal glands, kidneys, spleen, pancreas and stomach are grossly unremarkable. Cholecystectomy. No upper  abdominal adenopathy. Musculoskeletal: No worrisome lytic or sclerotic lesions. Degenerative changes are seen in the spine. IMPRESSION: 1. Interval response to therapy as evidenced by marked improvement in mediastinal/hilar adenopathy as well as decrease in size of primary right lower lobe nodule. 2. A 6 mm nodule along the right hemidiaphragm, previously questioned is new on 07/12/2015, is not readily appreciated. 3. Coronary artery calcification. Electronically Signed   By: MLorin PicketM.D.   On: 09/10/2015 16:43     CBC  Recent Labs Lab 09/11/15 0828 10/07/2015 1217 09/17/15 0510  WBC 3.9 8.2 2.0*  HGB 9.7* 9.8* 8.2*  HCT 28.8* 27.4* 23.8*  PLT 138* 185 119*  MCV 89.4 86.4 89.1  MCH 30.1 30.9 30.7  MCHC 33.7 35.8 34.5  RDW 18.3* 17.9* 18.3*  LYMPHSABS 0.3* 0.5*  --   MONOABS 0.3 0.0*  --   EOSABS 0.0 0.0  --   BASOSABS 0.0 0.0  --  Chemistries   Recent Labs Lab 09/11/15 0828 10/01/2015 1217 09/17/15 0510  NA 136 136 138  K 3.5 2.9* 3.2*  CL  --  104 110  CO2 24 21* 21*  GLUCOSE 246* 158* 144*  BUN 8.'4 10 8  '$ CREATININE 0.8 0.48 0.45  CALCIUM 9.8 9.0 8.4*  MG  --  1.4*  --   AST 11 16  --   ALT 13 10*  --   ALKPHOS 112 82  --   BILITOT 0.30 0.7  --    ------------------------------------------------------------------------------------------------------------------ estimated creatinine clearance is 82.9 mL/min (by C-G formula based on Cr of 0.45). ------------------------------------------------------------------------------------------------------------------ No results for input(s): HGBA1C in the last 72 hours. ------------------------------------------------------------------------------------------------------------------ No results for input(s): CHOL, HDL, LDLCALC, TRIG, CHOLHDL, LDLDIRECT in the last 72 hours. ------------------------------------------------------------------------------------------------------------------ No results for input(s): TSH,  T4TOTAL, T3FREE, THYROIDAB in the last 72 hours.  Invalid input(s): FREET3 ------------------------------------------------------------------------------------------------------------------ No results for input(s): VITAMINB12, FOLATE, FERRITIN, TIBC, IRON, RETICCTPCT in the last 72 hours.  Coagulation profile No results for input(s): INR, PROTIME in the last 168 hours.  No results for input(s): DDIMER in the last 72 hours.  Cardiac Enzymes No results for input(s): CKMB, TROPONINI, MYOGLOBIN in the last 168 hours.  Invalid input(s): CK ------------------------------------------------------------------------------------------------------------------ Invalid input(s): POCBNP     Time Spent in minutes   25 minutes   Kru Allman M.D on 09/17/2015 at 1:19 PM  Between 7am to 7pm - Pager - [phone]  After 7pm go to [email or web address] - password [facility]  [facility]   Office  [phone]

## 2015-09-17 NOTE — Telephone Encounter (Signed)
Patient's spouse/ex-husband Irma Newness called reporting "Sarah Leonard has decided she no longer wants chemotherapy.  She is willing to continue radiation but no more chemotherapy.  Her sister and I have tried to explain this is important.  We need Dr. Julien Nordmann to talk with her about this.  I was under Dr. Julien Nordmann and released after ten years and this may be harder on her than it was for me."  Advised i will notify Dr. Julien Nordmann but her treatment options are her choice.Marland Kitchen

## 2015-09-17 NOTE — Progress Notes (Signed)
Patient is out of bed in the recliner chair,requires encouragement to allow [place] to transfer her in the chair.

## 2015-09-17 NOTE — Progress Notes (Signed)
Patient unable to tolerate po K dur,Md aware and ordered to change to liquid. Pharmacy notified.Will continue to monitor.

## 2015-09-18 ENCOUNTER — Ambulatory Visit: Payer: Medicaid Other

## 2015-09-18 ENCOUNTER — Other Ambulatory Visit: Payer: Self-pay

## 2015-09-18 DIAGNOSIS — E876 Hypokalemia: Secondary | ICD-10-CM | POA: Diagnosis not present

## 2015-09-18 DIAGNOSIS — J449 Chronic obstructive pulmonary disease, unspecified: Secondary | ICD-10-CM | POA: Diagnosis not present

## 2015-09-18 DIAGNOSIS — K219 Gastro-esophageal reflux disease without esophagitis: Secondary | ICD-10-CM | POA: Diagnosis not present

## 2015-09-18 DIAGNOSIS — E1165 Type 2 diabetes mellitus with hyperglycemia: Secondary | ICD-10-CM | POA: Diagnosis not present

## 2015-09-18 LAB — CBC
HCT: 24.8 % — ABNORMAL LOW (ref 36.0–46.0)
HEMOGLOBIN: 8.8 g/dL — AB (ref 12.0–15.0)
MCH: 31.5 pg (ref 26.0–34.0)
MCHC: 35.5 g/dL (ref 30.0–36.0)
MCV: 88.9 fL (ref 78.0–100.0)
Platelets: 113 10*3/uL — ABNORMAL LOW (ref 150–400)
RBC: 2.79 MIL/uL — ABNORMAL LOW (ref 3.87–5.11)
RDW: 18 % — AB (ref 11.5–15.5)
WBC: 0.4 10*3/uL — CL (ref 4.0–10.5)

## 2015-09-18 LAB — GLUCOSE, CAPILLARY
GLUCOSE-CAPILLARY: 138 mg/dL — AB (ref 65–99)
GLUCOSE-CAPILLARY: 137 mg/dL — AB (ref 65–99)
Glucose-Capillary: 135 mg/dL — ABNORMAL HIGH (ref 65–99)
Glucose-Capillary: 166 mg/dL — ABNORMAL HIGH (ref 65–99)

## 2015-09-18 LAB — BASIC METABOLIC PANEL
Anion gap: 10 (ref 5–15)
BUN: 9 mg/dL (ref 6–20)
CALCIUM: 8.5 mg/dL — AB (ref 8.9–10.3)
CHLORIDE: 107 mmol/L (ref 101–111)
CO2: 21 mmol/L — ABNORMAL LOW (ref 22–32)
CREATININE: 0.4 mg/dL — AB (ref 0.44–1.00)
GFR calc non Af Amer: >60 mL/min (ref >60)
Glucose, Bld: 162 mg/dL — ABNORMAL HIGH (ref 65–99)
Potassium: 3 mmol/L — ABNORMAL LOW (ref 3.5–5.1)
SODIUM: 138 mmol/L (ref 135–145)

## 2015-09-18 MED ORDER — DIPHENOXYLATE-ATROPINE 2.5-0.025 MG PO TABS
2.0000 | ORAL_TABLET | Freq: Once | ORAL | Status: AC
  Administered 2015-09-18: 2 via ORAL
  Filled 2015-09-18: qty 2

## 2015-09-18 MED ORDER — OXYCODONE-ACETAMINOPHEN 5-325 MG PO TABS
1.0000 | ORAL_TABLET | ORAL | Status: DC | PRN
  Administered 2015-09-18 – 2015-09-24 (×13): 2 via ORAL
  Filled 2015-09-18 (×13): qty 2

## 2015-09-18 MED ORDER — DIPHENOXYLATE-ATROPINE 2.5-0.025 MG PO TABS
1.0000 | ORAL_TABLET | Freq: Four times a day (QID) | ORAL | Status: DC | PRN
  Administered 2015-09-18 – 2015-09-22 (×6): 1 via ORAL
  Filled 2015-09-18 (×6): qty 1

## 2015-09-18 MED ORDER — MORPHINE SULFATE (PF) 2 MG/ML IV SOLN
2.0000 mg | Freq: Once | INTRAVENOUS | Status: AC
  Administered 2015-09-18: 2 mg via INTRAVENOUS
  Filled 2015-09-18: qty 1

## 2015-09-18 MED ORDER — LORAZEPAM 2 MG/ML IJ SOLN
1.0000 mg | Freq: Once | INTRAMUSCULAR | Status: AC
  Administered 2015-09-18: 1 mg via INTRAVENOUS
  Filled 2015-09-18: qty 1

## 2015-09-18 MED ORDER — MORPHINE SULFATE (PF) 2 MG/ML IV SOLN
2.0000 mg | INTRAVENOUS | Status: DC | PRN
  Administered 2015-09-18 (×4): 2 mg via INTRAVENOUS
  Filled 2015-09-18 (×4): qty 1

## 2015-09-18 MED ORDER — TBO-FILGRASTIM 480 MCG/0.8ML ~~LOC~~ SOSY
480.0000 ug | PREFILLED_SYRINGE | Freq: Once | SUBCUTANEOUS | Status: AC
  Administered 2015-09-18: 480 ug via SUBCUTANEOUS
  Filled 2015-09-18: qty 0.8

## 2015-09-18 MED ORDER — MORPHINE SULFATE (PF) 2 MG/ML IV SOLN
2.0000 mg | INTRAVENOUS | Status: DC | PRN
  Administered 2015-09-18 – 2015-09-24 (×32): 2 mg via INTRAVENOUS
  Filled 2015-09-18 (×33): qty 1

## 2015-09-18 MED ORDER — MORPHINE SULFATE (PF) 4 MG/ML IV SOLN: 4.0000 mg | Freq: Once | INTRAVENOUS | Status: DC

## 2015-09-18 MED ORDER — POTASSIUM CHLORIDE IN NACL 20-0.9 MEQ/L-% IV SOLN
INTRAVENOUS | Status: AC
  Administered 2015-09-18: 1000 mL via INTRAVENOUS
  Filled 2015-09-18 (×3): qty 1000

## 2015-09-18 NOTE — Progress Notes (Signed)
Patient Demographics  Sarah Leonard, is a 55 y.o. female, DOB - 1961/02/07, LAG:[id number]  Admit date - 09/14/2015   Admitting Physician Albertine Patricia, MD  Outpatient Primary MD for the patient is Moreen Fowler  LOS -    Chief Complaint  Patient presents with  . Nausea  . Emesis       Admission HPI/Breif narrative: 55 y.o. female, with recent diagnosis of small cell lung cancer, COPD, depression, diabetes mellitus, hyperlipidemia, pancreatitis, bipolar disorder, history of tobacco, alcohol, and cocaine abuse in the past, presents with complaints of nausea or vomiting post chemotherapy, afebrile during hospital stay, still with significant nausea vomiting and diarrhea, poor oral intake, on IV fluid for hydration,post chemo nadir, received Granix on 2/7/  Subjective:   [name] today has, No headache, complaints of diarrhea, nausea and vomiting, generalized weakness, has very poor appetite. Assessment & Plan    Active Problems:   COPD (chronic obstructive pulmonary disease) (HCC)   Hyperlipidemia   GERD (gastroesophageal reflux disease)   Nausea & vomiting   Hypokalemia  Intractable nausea, vomiting and diarrhea. - This is secondary to chemotherapy, continue on when necessary nausea medication , advanced to modified diet yesterday, but remains with poor oral intake, encouraged to drink supplements, increase her IV fluids giving her significant diarrhea and vomiting today.  Lung cancer - She is on chemotherapy, discussed with Dr. Earlie Server, as well on radiation therapy, will go for radiation this afternoon.  COPD - No active wheezing, continue with home medication including Symbicort and Spiriva, on when necessary DuoNeb.  Diabetes mellitus - Acceptable on current dose Lantus, on SSI  GERD - Continue with PPI  Hyperlipidemia - Continue with statin  Hypokalemia -  Repleted, will recheck again tomorrow ,to check with magnesium and phosphorus.  Bipolar depression and depression - Stable. No suicidal or homicidal ideations. - Continue quetiapine and lamotrigine  Pancytopenia - Patient with leukopenia, anemia, thrombocytopenia, this is secondary to chemotherapy, with significant leukopenia today white count of 0.4, so she is certainly neutropenic, scheduled Dr. Earlie Server, will give Granix today.  Code Status: Full  Family Communication: Discussed with patient  Disposition Plan: Pending PT consult   Procedures  None   Consults   Discussed with oncology via phone   Medications  Scheduled Meds: . antiseptic oral rinse  7 mL Mouth Rinse BID  . budesonide-formoterol  2 puff Inhalation BID  . calcium-vitamin D  1 tablet Oral BID  . cholecalciferol  1,000 Units Oral Daily  . feeding supplement (ENSURE ENLIVE)  237 mL Oral BID BM  . gabapentin  600 mg Oral TID  . heparin  5,000 Units Subcutaneous 3 times per day  . insulin aspart  0-9 Units Subcutaneous TID WC  . insulin glargine  5 Units Subcutaneous QHS  . lamoTRIgine  125 mg Oral BID  . magnesium oxide  800 mg Oral TID  . niacin  750 mg Oral QHS  . pantoprazole  40 mg Oral Daily  . potassium chloride  40 mEq Oral BID  . QUEtiapine  800 mg Oral QHS  . simvastatin  40 mg Oral q1800  . sucralfate  1 g Oral TID WC & HS  . tiotropium  18 mcg Inhalation Daily  Continuous Infusions: . sodium chloride 1,000 mL (09/18/15 0815)   PRN Meds:.acetaminophen **OR** acetaminophen, albuterol, diphenoxylate-atropine, ipratropium-albuterol, morphine injection, ondansetron **OR** ondansetron (ZOFRAN) IV, oxyCODONE-acetaminophen, prochlorperazine, promethazine, zolpidem  DVT Prophylaxis Heparin   Lab Results  Component Value Date   PLT 113* 09/18/2015    Antibiotics    Anti-infectives    None          Objective:   Filed Vitals:   09/17/15 2108 09/18/15 0454 09/18/15 0900 09/18/15  1251  BP: 122/72 117/72  129/71  Pulse: 104 120  110  Temp: 98.1 F (36.7 C) 98.6 F (37 C)  98.7 F (37.1 C)  TempSrc: Oral Oral  Oral  Resp: '16 16  18  '$ SpO2: 99% 100% 98% 100%    Wt Readings from Last 3 Encounters:  09/11/15 86.682 kg (191 lb 1.6 oz)  09/07/15 85.367 kg (188 lb 3.2 oz)  08/31/15 85.73 kg (189 lb)     Intake/Output Summary (Last 24 hours) at 09/18/15 1417 Last data filed at 09/18/15 1408  Gross per 24 hour  Intake   1686 ml  Output      0 ml  Net   1686 ml     Physical Exam  Awake Alert, Oriented X 3,Alopecia+ [place].AT,PERRAL Supple Neck,No JVD, Symmetrical Chest wall movement, Good air movement bilaterally RRR,No Gallops,Rubs or new Murmurs, No Parasternal Heave +ve B.Sounds, Abd Soft, No tenderness,No rebound - guarding or rigidity. No Cyanosis, Clubbing or edema, No new Rash or bruise    Data Review   Micro Results Recent Results (from the past 240 hour(s))  Culture, blood (routine x 2)     Status: None (Preliminary result)   Collection Time: 09/25/2015 12:17 PM  Result Value Ref Range Status   Specimen Description PORTA CATH  Final   Special Requests BOTTLES DRAWN AEROBIC AND ANAEROBIC 5CC  Final   Culture   Final    NO GROWTH 2 DAYS Performed at [facility]    Report Status PENDING  Incomplete  Culture, blood (routine x 2)     Status: None (Preliminary result)   Collection Time: 09/21/2015 12:18 PM  Result Value Ref Range Status   Specimen Description RIGHT ANTECUBITAL  Final   Special Requests BOTTLES DRAWN AEROBIC AND ANAEROBIC 5CC  Final   Culture   Final    NO GROWTH 2 DAYS Performed at [facility]    Report Status PENDING  Incomplete  Urine culture     Status: None   Collection Time: 09/26/2015  2:08 PM  Result Value Ref Range Status   Specimen Description URINE, CLEAN CATCH  Final   Special Requests NONE  Final   Culture   Final    NO GROWTH 1 DAY Performed at [facility]    Report Status 09/17/2015  FINAL  Final    Radiology Reports Dg Chest 2 View  09/28/2015  CLINICAL DATA:  Lung cancer with chemotherapy.  Fever EXAM: CHEST  2 VIEW COMPARISON:  08/09/2015 FINDINGS: Lungs are clear without evidence of pneumonia. No heart failure or effusion. Port-A-Cath tip in the SVC. IMPRESSION: No active cardiopulmonary disease. Electronically Signed   By: Franchot Gallo M.D.   On: 09/28/2015 13:13   Ct Chest W Contrast  09/10/2015  CLINICAL DATA:  Lung cancer, right lung, restaging. Ongoing radiation therapy. EXAM: CT CHEST WITH CONTRAST TECHNIQUE: Multidetector CT imaging of the chest was performed during intravenous contrast administration. CONTRAST:  71m OMNIPAQUE IOHEXOL 300 MG/ML  SOLN COMPARISON:  PET 07/12/2015 and CT chest 06/19/2015. FINDINGS: Mediastinum/Nodes: Right IJ Port-A-Cath terminates in the right atrium. Subcarinal lymph node measures 1.8 cm, previously 3.0 cm. Mediastinal and hilar lymph nodes are otherwise within normal limits by CT size criteria, which represents an improvement from the prior exam. No axillary adenopathy. Coronary artery calcification. Heart size normal. No pericardial effusion. Thickening of the distal esophageal wall can be seen with gastroesophageal reflux disease. Lungs/Pleura: Mild scattered residual peribronchovascular ground-glass and nodularity, likely postinfectious/postinflammatory etiology. Right lower lobe nodule measures approximately 8 mm (series 4, image 43), previously 14 mm. Previously questioned 6 mm nodule along the right hemidiaphragm is not readily appreciated. No pleural fluid. Airway is unremarkable. Upper abdomen: Visualized portions of the liver, adrenal glands, kidneys, spleen, pancreas and stomach are grossly unremarkable. Cholecystectomy. No upper abdominal adenopathy. Musculoskeletal: No worrisome lytic or sclerotic lesions. Degenerative changes are seen in the spine. IMPRESSION: 1. Interval response to therapy as evidenced by marked improvement in  mediastinal/hilar adenopathy as well as decrease in size of primary right lower lobe nodule. 2. A 6 mm nodule along the right hemidiaphragm, previously questioned is new on 07/12/2015, is not readily appreciated. 3. Coronary artery calcification. Electronically Signed   By: Lorin Picket M.D.   On: 09/10/2015 16:43     CBC  Recent Labs Lab 09/28/2015 1217 09/17/15 0510 09/18/15 0320  WBC 8.2 2.0* 0.4*  HGB 9.8* 8.2* 8.8*  HCT 27.4* 23.8* 24.8*  PLT 185 119* 113*  MCV 86.4 89.1 88.9  MCH 30.9 30.7 31.5  MCHC 35.8 34.5 35.5  RDW 17.9* 18.3* 18.0*  LYMPHSABS 0.5*  --   --   MONOABS 0.0*  --   --   EOSABS 0.0  --   --   BASOSABS 0.0  --   --     Chemistries   Recent Labs Lab 09/23/2015 1217 09/17/15 0510 09/18/15 0320  NA 136 138 138  K 2.9* 3.2* 3.0*  CL 104 110 107  CO2 21* 21* 21*  GLUCOSE 158* 144* 162*  BUN '10 8 9  '$ CREATININE 0.48 0.45 0.40*  CALCIUM 9.0 8.4* 8.5*  MG 1.4*  --   --   AST 16  --   --   ALT 10*  --   --   ALKPHOS 82  --   --   BILITOT 0.7  --   --    ------------------------------------------------------------------------------------------------------------------ estimated creatinine clearance is 82.9 mL/min (by C-G formula based on Cr of 0.4). ------------------------------------------------------------------------------------------------------------------ No results for input(s): HGBA1C in the last 72 hours. ------------------------------------------------------------------------------------------------------------------ No results for input(s): CHOL, HDL, LDLCALC, TRIG, CHOLHDL, LDLDIRECT in the last 72 hours. ------------------------------------------------------------------------------------------------------------------ No results for input(s): TSH, T4TOTAL, T3FREE, THYROIDAB in the last 72 hours.  Invalid input(s): FREET3 ------------------------------------------------------------------------------------------------------------------ No  results for input(s): VITAMINB12, FOLATE, FERRITIN, TIBC, IRON, RETICCTPCT in the last 72 hours.  Coagulation profile No results for input(s): INR, PROTIME in the last 168 hours.  No results for input(s): DDIMER in the last 72 hours.  Cardiac Enzymes No results for input(s): CKMB, TROPONINI, MYOGLOBIN in the last 168 hours.  Invalid input(s): CK ------------------------------------------------------------------------------------------------------------------ Invalid input(s): POCBNP     Time Spent in minutes   25 minutes   Suhey Radford M.D on 09/18/2015 at 2:17 PM  Between 7am to 7pm - Pager - [phone]  After 7pm go to [email or web address] - password [facility]  [facility]   Office  [phone]

## 2015-09-18 NOTE — Progress Notes (Signed)
PT Cancellation Note  Patient Details Name: Sarah Leonard MRN: [id number] DOB: 11-05-1960   Cancelled Treatment:    Reason Eval/Treat Not Completed: Medical issues which prohibited therapy (patient currently with NV at bedside. )   Claretha Cooper 09/18/2015, 1:22 PM Tresa Endo PT [phone]

## 2015-09-18 NOTE — Progress Notes (Signed)
Initial Nutrition Assessment  DOCUMENTATION CODES:   Severe malnutrition in context of acute illness/injury, Obesity unspecified  INTERVENTION:   -Continue Ensure Enlive po BID, each supplement provides 350 kcals and 20 grams of protein. -May need to consider nutrition support if unable to tolerate po intake for more than 7 days.   -Will continue to monitor po intake.  NUTRITION DIAGNOSIS:   Malnutrition related to acute illness, cancer and cancer related treatments as evidenced by percent weight loss, energy intake < or equal to 50% for > or equal to 5 days.  GOAL:   Patient will meet greater than or equal to 90% of their needs  MONITOR:   PO intake, Supplement acceptance, Weight trends, Labs  REASON FOR ASSESSMENT:   Malnutrition Screening Tool   ASSESSMENT:   55 y.o. female, with recent diagnosis of small cell lung cancer, COPD, depression, diabetes mellitus, hyperlipidemia, pancreatitis, bipolar disorder, history of tobacco, alcohol, and cocaine abuse in the past.  Patient reports feeling very tired and that she has continued to have nausea and vomiting through out the day. Pt reports vomiting after trying to drink some ensure earlier today.  Pt was delivered a breakfast tray but ate nothing from it.  Meal completion is 0% per chart review.  Denies taste changes.  Pt does report painful swallowing since beginning cancer treatment.  Nutrition Focused Physical Exam was not completed due to patients nausea and vomiting. Will attempt at follow up.  Per chart review, pt weight 214# on 07/31/15.  Pt weighed 191# on 09/11/15, this was the last documented weight.  Needs an updated weight. Pt has an 11% weight loss over 6 weeks, which is significant for the time frame.   Meds: [place] with Vit D, Vitamin D, insulin, niacin, protonix, zofran injection prn, compazine injection, phenergan suppository.  Labs: elevated glucose (162), low potassium (3.0).  RD encouraged pt to still try and  consume small meals throughout the day and for her to continue to sip on Ensure.   Diet Order:  Diet Carb Modified Fluid consistency:: Thin; Room service appropriate?: Yes  Skin:  Reviewed, no issues  Last BM:  09/18/15  Height:   Ht Readings from Last 1 Encounters:  09/11/15 '5\' 3"'$  (1.6 m)   Weight:   Wt Readings from Last 1 Encounters:  09/11/15 191 lb 1.6 oz (86.682 kg)   Ideal Body Weight:  52.3 kg  BMI:  There is no weight on file to calculate BMI.  Estimated Nutritional Needs:   Kcal:  1900-2100  Protein:  95-105 grams  Fluid:  >/= 2L  EDUCATION NEEDS:   No education needs identified at this time  Veronda Prude, Dietetic Intern Pager: [phone]

## 2015-09-19 ENCOUNTER — Ambulatory Visit: Payer: Medicaid Other

## 2015-09-19 ENCOUNTER — Ambulatory Visit
Admit: 2015-09-19 | Discharge: 2015-09-19 | Disposition: A | Discharge status: Home / Self Care | Payer: Medicaid Other | Attending: Radiation Oncology | Admitting: Radiation Oncology

## 2015-09-19 ENCOUNTER — Telehealth: Payer: Self-pay | Admitting: *Deleted

## 2015-09-19 DIAGNOSIS — D6959 Other secondary thrombocytopenia: Secondary | ICD-10-CM | POA: Diagnosis present

## 2015-09-19 DIAGNOSIS — E876 Hypokalemia: Secondary | ICD-10-CM

## 2015-09-19 DIAGNOSIS — L658 Other specified nonscarring hair loss: Secondary | ICD-10-CM | POA: Diagnosis present

## 2015-09-19 DIAGNOSIS — D61810 Antineoplastic chemotherapy induced pancytopenia: Secondary | ICD-10-CM

## 2015-09-19 DIAGNOSIS — T451X5A Adverse effect of antineoplastic and immunosuppressive drugs, initial encounter: Secondary | ICD-10-CM | POA: Diagnosis present

## 2015-09-19 DIAGNOSIS — K219 Gastro-esophageal reflux disease without esophagitis: Secondary | ICD-10-CM | POA: Diagnosis present

## 2015-09-19 DIAGNOSIS — J439 Emphysema, unspecified: Secondary | ICD-10-CM

## 2015-09-19 DIAGNOSIS — Z87891 Personal history of nicotine dependence: Secondary | ICD-10-CM | POA: Diagnosis not present

## 2015-09-19 DIAGNOSIS — R1110 Vomiting, unspecified: Secondary | ICD-10-CM | POA: Diagnosis not present

## 2015-09-19 DIAGNOSIS — G8929 Other chronic pain: Secondary | ICD-10-CM | POA: Diagnosis present

## 2015-09-19 DIAGNOSIS — J449 Chronic obstructive pulmonary disease, unspecified: Secondary | ICD-10-CM | POA: Diagnosis present

## 2015-09-19 DIAGNOSIS — Z7982 Long term (current) use of aspirin: Secondary | ICD-10-CM | POA: Diagnosis not present

## 2015-09-19 DIAGNOSIS — R112 Nausea with vomiting, unspecified: Secondary | ICD-10-CM | POA: Diagnosis present

## 2015-09-19 DIAGNOSIS — R5081 Fever presenting with conditions classified elsewhere: Secondary | ICD-10-CM | POA: Diagnosis present

## 2015-09-19 DIAGNOSIS — E119 Type 2 diabetes mellitus without complications: Secondary | ICD-10-CM | POA: Diagnosis present

## 2015-09-19 DIAGNOSIS — Z803 Family history of malignant neoplasm of breast: Secondary | ICD-10-CM | POA: Diagnosis not present

## 2015-09-19 DIAGNOSIS — K567 Ileus, unspecified: Secondary | ICD-10-CM | POA: Diagnosis present

## 2015-09-19 DIAGNOSIS — F319 Bipolar disorder, unspecified: Secondary | ICD-10-CM | POA: Diagnosis present

## 2015-09-19 DIAGNOSIS — R197 Diarrhea, unspecified: Secondary | ICD-10-CM | POA: Diagnosis present

## 2015-09-19 DIAGNOSIS — Z794 Long term (current) use of insulin: Secondary | ICD-10-CM | POA: Diagnosis not present

## 2015-09-19 DIAGNOSIS — Z833 Family history of diabetes mellitus: Secondary | ICD-10-CM | POA: Diagnosis not present

## 2015-09-19 DIAGNOSIS — R001 Bradycardia, unspecified: Secondary | ICD-10-CM | POA: Diagnosis present

## 2015-09-19 DIAGNOSIS — E785 Hyperlipidemia, unspecified: Secondary | ICD-10-CM | POA: Diagnosis present

## 2015-09-19 DIAGNOSIS — E441 Mild protein-calorie malnutrition: Secondary | ICD-10-CM | POA: Diagnosis present

## 2015-09-19 DIAGNOSIS — K529 Noninfective gastroenteritis and colitis, unspecified: Secondary | ICD-10-CM | POA: Diagnosis not present

## 2015-09-19 DIAGNOSIS — Z79899 Other long term (current) drug therapy: Secondary | ICD-10-CM | POA: Diagnosis not present

## 2015-09-19 DIAGNOSIS — M549 Dorsalgia, unspecified: Secondary | ICD-10-CM | POA: Diagnosis present

## 2015-09-19 DIAGNOSIS — C3490 Malignant neoplasm of unspecified part of unspecified bronchus or lung: Secondary | ICD-10-CM | POA: Diagnosis present

## 2015-09-19 LAB — GLUCOSE, CAPILLARY
GLUCOSE-CAPILLARY: 184 mg/dL — AB (ref 65–99)
GLUCOSE-CAPILLARY: 162 mg/dL — AB (ref 65–99)
GLUCOSE-CAPILLARY: 183 mg/dL — AB (ref 65–99)
GLUCOSE-CAPILLARY: 202 mg/dL — AB (ref 65–99)

## 2015-09-19 LAB — BASIC METABOLIC PANEL
ANION GAP: 10 (ref 5–15)
Anion gap: 10 (ref 5–15)
BUN: 8 mg/dL (ref 6–20)
BUN: 9 mg/dL (ref 6–20)
CALCIUM: 8.5 mg/dL — AB (ref 8.9–10.3)
CALCIUM: 7.9 mg/dL — AB (ref 8.9–10.3)
CHLORIDE: 108 mmol/L (ref 101–111)
CHLORIDE: 104 mmol/L (ref 101–111)
CO2: 21 mmol/L — AB (ref 22–32)
CO2: 20 mmol/L — ABNORMAL LOW (ref 22–32)
CREATININE: 0.45 mg/dL (ref 0.44–1.00)
Creatinine, Ser: 0.42 mg/dL — ABNORMAL LOW (ref 0.44–1.00)
GFR calc Af Amer: >60 mL/min (ref >60)
GFR calc non Af Amer: >60 mL/min (ref >60)
GFR calc non Af Amer: >60 mL/min (ref >60)
GLUCOSE: 206 mg/dL — AB (ref 65–99)
Glucose, Bld: 229 mg/dL — ABNORMAL HIGH (ref 65–99)
POTASSIUM: 2.9 mmol/L — AB (ref 3.5–5.1)
Potassium: 2.6 mmol/L — CL (ref 3.5–5.1)
SODIUM: 134 mmol/L — AB (ref 135–145)
Sodium: 139 mmol/L (ref 135–145)

## 2015-09-19 LAB — CBC WITH DIFFERENTIAL/PLATELET
BASOS ABS: 0 10*3/uL (ref 0.0–0.1)
BLASTS: 0 %
Band Neutrophils: 0 %
Basophils Relative: 0 %
EOS PCT: 0 %
Eosinophils Absolute: 0 10*3/uL (ref 0.0–0.7)
HEMATOCRIT: 24 % — AB (ref 36.0–46.0)
HEMOGLOBIN: 8.4 g/dL — AB (ref 12.0–15.0)
Lymphocytes Relative: 73 %
Lymphs Abs: 0.1 10*3/uL — ABNORMAL LOW (ref 0.7–4.0)
MCH: 30.8 pg (ref 26.0–34.0)
MCHC: 35 g/dL (ref 30.0–36.0)
MCV: 87.9 fL (ref 78.0–100.0)
METAMYELOCYTES PCT: 0 %
MYELOCYTES: 0 %
Monocytes Absolute: 0 10*3/uL — ABNORMAL LOW (ref 0.1–1.0)
Monocytes Relative: 7 %
NEUTROS PCT: 20 %
NRBC: 0 /100{WBCs}
Neutro Abs: 0 10*3/uL — ABNORMAL LOW (ref 1.7–7.7)
Other: 0 %
Platelets: 58 10*3/uL — ABNORMAL LOW (ref 150–400)
Promyelocytes Absolute: 0 %
RBC: 2.73 MIL/uL — AB (ref 3.87–5.11)
RDW: 17.7 % — ABNORMAL HIGH (ref 11.5–15.5)
WBC: 0.1 10*3/uL — AB (ref 4.0–10.5)

## 2015-09-19 LAB — PHOSPHORUS: PHOSPHORUS: 3 mg/dL (ref 2.5–4.6)

## 2015-09-19 LAB — MAGNESIUM
MAGNESIUM: 1.6 mg/dL — AB (ref 1.7–2.4)
Magnesium: 1.3 mg/dL — ABNORMAL LOW (ref 1.7–2.4)

## 2015-09-19 MED ORDER — ONDANSETRON HCL 4 MG PO TABS: 4.0000 mg | ORAL_TABLET | Freq: Four times a day (QID) | ORAL | Status: DC

## 2015-09-19 MED ORDER — MAGNESIUM SULFATE 2 GM/50ML IV SOLN
2.0000 g | Freq: Once | INTRAVENOUS | Status: AC
  Administered 2015-09-19: 2 g via INTRAVENOUS
  Filled 2015-09-19: qty 50

## 2015-09-19 MED ORDER — ONDANSETRON HCL 4 MG/2ML IJ SOLN: 4.0000 mg | Freq: Four times a day (QID) | INTRAMUSCULAR | Status: DC

## 2015-09-19 MED ORDER — ONDANSETRON HCL 4 MG/2ML IJ SOLN
4.0000 mg | Freq: Four times a day (QID) | INTRAMUSCULAR | Status: DC
  Administered 2015-09-19 – 2015-09-24 (×21): 4 mg via INTRAVENOUS
  Filled 2015-09-19 (×22): qty 2

## 2015-09-19 MED ORDER — POTASSIUM CHLORIDE 10 MEQ/100ML IV SOLN
10.0000 meq | INTRAVENOUS | Status: AC
  Administered 2015-09-19 (×4): 10 meq via INTRAVENOUS
  Filled 2015-09-19 (×4): qty 100

## 2015-09-19 MED ORDER — PROMETHAZINE HCL 25 MG/ML IJ SOLN
25.0000 mg | Freq: Four times a day (QID) | INTRAMUSCULAR | Status: DC | PRN
  Administered 2015-09-19 – 2015-09-22 (×8): 25 mg via INTRAVENOUS
  Filled 2015-09-19 (×9): qty 1

## 2015-09-19 NOTE — Progress Notes (Signed)
CRITICAL VALUE ALERT  Critical value received:  K+ 2.6  Date of notification:  09/19/15  Time of notification:  1648  Critical value read back:Yes.    Nurse who received alert:  Laural Benes  MD notified (1st page):  Dr. Wynelle Cleveland  Time of first page:  1651  MD notified (2nd page):  Time of second page:  Responding MD:  Dr. Wynelle Cleveland  Time MD responded:  [phone]

## 2015-09-19 NOTE — Clinical Social Work Note (Signed)
Clinical Social Work Assessment  Patient Details  Name: Sarah Leonard MRN: [id number] Date of Birth: 1960-10-30  Date of referral:  09/19/15               Reason for consult:  Discharge Planning                Permission sought to share information with:  Family Supports Permission granted to share information::  Yes, Verbal Permission Granted  Name::     [place]::     Relationship::  sister  Contact Information:  [phone]  Housing/Transportation Living arrangements for the past 2 months:  [place] of Information:  Patient Patient Interpreter Needed:  None Criminal Activity/Legal Involvement Pertinent to Current Situation/Hospitalization:  No - Comment as needed Significant Relationships:  Spouse, Siblings Lives with:  Facility Resident Do you feel safe going back to the place where you live?   (depending on progress) Need for family participation in patient care:  Yes (Comment)  Care giving concerns:  Pt admitted from [place]. Unclear what pt needs will be at discharge as pt has not yet been able to participate with PT due to nausea.   Social Worker assessment / plan:    CSW received referral that pt admitted from [place] [place]. CSW received notification that pt may need higher level of care. Per chart, review pt has not yet agreed to PT evaluation due to persistent nausea. Per nurse tech, pt is able to move in bed with supervision and able to walk to bedside commode. CSW unsure at this time if pt would qualify for SNF under Medicaid without PT evaluation.  CSW met with pt at bedside. CSW introduced self and explained role. Pt confirmed that she is resident at [facility]. CSW inquired with pt about possible need for higher level of care and pt stated, "I may". Pt did not feel well enough for continued conversation and asked CSW to contact pt sister, Sarah Leonard. CSW contacted pt sister, Sarah Leonard and left voice message.  CSW to follow  up to continue to assess best plan for discharge between return to [facility] vs. SNF.   CSW to continue to follow.   Employment status:  Disabled (Comment on whether or not currently receiving Disability) Insurance information:  Medicaid [place] PT Recommendations:  Not assessed at this time Information / Referral to community resources:     Patient/Family's Response to care:  Pt alert and oriented x 4, but feeling poorly at time of visit and unable to effectively participate in assessment. CSW attempted to reach pt sister, Sarah Leonard and left voice message.  Patient/Family's Understanding of and Emotional Response to Diagnosis, Current Treatment, and Prognosis:  Unable to assess due to pt not feeling well enough to engage in assessment.   Emotional Assessment Appearance:  Appears stated age Attitude/Demeanor/Rapport:  Other (pt quiet, feeling bad due to nausea) Affect (typically observed):  Appropriate Orientation:  Oriented to Self, Oriented to Place, Oriented to  Time, Oriented to Situation Alcohol / Substance use:  Not Applicable Psych involvement (Current and /or in the community):  No (Comment)  Discharge Needs  Concerns to be addressed:  Discharge Planning Concerns Readmission within the last 30 days:  No Current discharge risk:  None Barriers to Discharge:  Continued Medical Work up   Sarah Leonard A, LCSW 09/19/2015, 1:49 PM  [phone]

## 2015-09-19 NOTE — Telephone Encounter (Signed)
Called  3 [place] floor spoke with Maudie Mercury, RN, asked if patient was willing to come for her rad tx today  At 250pm," I heard the nurse who was in the patient's room  ask the patient and patient said no, refusing radiation treatment today",thaked RN will notify RT therapist  And MD and our PA-c 9:47 AM

## 2015-09-19 NOTE — Progress Notes (Signed)
Nutrition Follow-up  DOCUMENTATION CODES:   Severe malnutrition in context of acute illness/injury, Obesity unspecified  INTERVENTION:   -Please monitor magnesium, phosphorus, and potassium for at least three days as patient is at risk for refeeding syndrome.  MD to replete as warranted.  -Provided patient with some clear liquid options from nourishment room. -Encouraged po intake. -Will continue to monitor for improvements in intake.  NUTRITION DIAGNOSIS:   Malnutrition related to acute illness, cancer and cancer related treatments as evidenced by percent weight loss, energy intake < or equal to 50% for > or equal to 5 days.  Ongoing  GOAL:   Patient will meet greater than or equal to 90% of their needs  Not meeting  MONITOR:   PO intake, Supplement acceptance, Weight trends, Labs  REASON FOR ASSESSMENT:   Malnutrition Screening Tool    ASSESSMENT:   55 y.o. female, with recent diagnosis of small cell lung cancer, COPD, depression, diabetes mellitus, hyperlipidemia, pancreatitis, bipolar disorder, history of tobacco, alcohol, and cocaine abuse in the past.  Nurse states patient consuming coffee and [place] icees throughout the day.  Nausea meds have been adjusted to every 6 hours.  Pt reports that she can best tolerate [place] icees and ginger ale.  States that the coffee was not the best option so she stopped drinking it.  Pt reports that she still has abdominal pain.  RD encouraged good po intake of the full liquids she was tolerating.  Pt amenable to jello and ginger ale which the RD provided at time of visit.    Weight for this admission was obtained.  Pt with 14# weight loss since 1/31, representing a 7% weight loss over x 1 week, which is significant for this time frame.  Labs reviewed: low potassium (2.9), low magnesium (1.3), low hemoglobin (8.4), elevated glucose (184)  Meds reviewed: [place] with D, Vitamin D, insulin, niacin, zofran injection, compazine  injection, phenergan injection. Per MD during interdisciplinary rounds, pt to receive IV potassium, magnesium, and iron.   Diet Order:  Diet clear liquid Room service appropriate?: Yes; Fluid consistency:: Thin  Skin:  Reviewed, no issues  Last BM:  09/19/15  Height:   Ht Readings from Last 1 Encounters:  09/19/15 '5\' 3"'$  (1.6 m)   Weight:   Wt Readings from Last 1 Encounters:  09/19/15 177 lb 11.1 oz (80.6 kg)   Ideal Body Weight:  52.3 kg  BMI:  Body mass index is 31.48 kg/(m^2).  Estimated Nutritional Needs:   Kcal:  1900-2100  Protein:  95-105 grams  Fluid:  >/= 2L  EDUCATION NEEDS:   No education needs identified at this time  Veronda Prude, Dietetic Intern Pager: [phone]

## 2015-09-19 NOTE — Progress Notes (Signed)
CSW continuing to follow.   CSW received return phone call from pt sister, [name].   CSW spoke to pt sister at length about pt care. Pt sister discussed that pt has lived at [facility] for eleven years. Pt sister concerned that [facility] is unable to continue to meet pt needs. Pt sister reports that [facility] was in conversation with [facility] about pt transitioning to SNF at [facility] and [facility]. Pt is primary decision maker and stated to CSW that she is agreeable to looking into options. Pt sisters are supportive and actively involved in pt care. Pt sister, [name] did express concern about pt voicing that she did not want to continue treatment and hopeful that pt will reconsider this if she is able to get stronger and overcome feeling so poorly at this time.   CSW contacted [facility] and sent pt clinical information. CSW spoke with admissions coordinator who will review information and notify CSW if facility can offer pt placement post discharge.  CSW to follow up with pt and pt family regarding plans.   CSW to continue to follow.  Alison Murray, MSW, [place] Work [phone]

## 2015-09-19 NOTE — Progress Notes (Signed)
PT Cancellation Note  Patient Details Name: AYANA IMHOF MRN: [id number] DOB: 1960-12-15   Cancelled Treatment:    Reason Eval/Treat Not Completed: Medical issues which prohibited therapy (patient states that she can not do anything. gets to [facility]. Noted dry heaving.)   Claretha Cooper 09/19/2015, 11:50 AM Tresa Endo PT [phone]

## 2015-09-19 NOTE — Progress Notes (Signed)
[facility] Progress Note   Sarah Leonard  LNL:[id number]  DOB: Apr 30, 1961  DOA: 09/22/2015 PCP: Moreen Fowler  Brief narrative: Sarah Leonard is a 55 y.o. female presenting from [facility] assisted living facility with a recent diagnosis of small cell lung cancer and history of COPD, tobacco abuse depression, diabetes mellitus, hyperlipidemia, bipolar disorder, alcohol and cocaine abuse in the past who presents to the hospital with vomiting and diarrhea occurring post-chemotherapy. There is a note from the day of admission that also states that she also had a temperature 101.7 but there have been no fevers noted since she has been admitted to the hospital.  She is undergoing chemotherapy which is being given by Dr. Julien Nordmann. She is also receiving radiotherapy under the care of Dr. Tammi Klippel.   Subjective: Continues to have vomiting even with clear liquids. Last episode of diarrhea was yesterday. Has diffuse abdominal discomfort. No fevers or chills. No cough shortness of breath or chest pain.  Assessment/Plan: Principal Problem:   Nausea & vomiting -As a result of chemotherapy -We'll change Zofran to every 6 hours routine rather than when necessary -Add IV Phenergan -Continue IV fluids and pain control  Active Problems:   Antineoplastic chemotherapy induced pancytopenia  - Received a dose of Granix  -Hold heparin and Lovenox due to drop in platelets to 58  Small cell lung cancer- (T1a, N2, M0) -Under the care of Dr. Julien Nordmann and Dr. Tammi Klippel -Received 3 cycles of chemotherapy- last cycle was 1/31    COPD (chronic obstructive pulmonary disease)  -Appears stable at this time - Is receiving Spiriva and nebulizer treatments    Hyperlipidemia -Is receiving Zocor and niacin    GERD (gastroesophageal reflux disease) -Is receiving sucralfate    Hypokalemia from [place] to take oral iron due to vomiting-have ordered IV iron and magnesium  replacement  Diabetes mellitus -We can continue low-dose Lantus and sliding scale insulin    Antibiotics: Anti-infectives    None     Code Status:     Code Status Orders        Start     Ordered   09/21/2015 1746  Full code   Continuous     09/17/2015 1745    Code Status History    Date Active Date Inactive Code Status Order ID Comments User Context   07/22/2015 11:25 AM 07/25/2015  5:45 PM Full Code [id number]  Samella Parr, NP ED   06/17/2015  5:04 AM 06/22/2015  5:59 PM Full Code [id number]  Ivor Costa, MD ED   06/06/2015  3:10 AM 06/11/2015 10:05 PM Full Code [id number]  Edwin Dada, MD Inpatient   11/25/2014  2:13 AM 11/29/2014  8:07 PM Full Code [id number]  Rise Patience, MD ED   02/28/2013 11:55 AM 03/05/2013  3:08 PM Full Code [id number]  Marijean Heath, NP ED     Family Communication:  Disposition Plan: Unable to determine discharge date - at this time she is still vomiting and unable to tolerate liquids DVT prophylaxis:  SCDs Consultants:   Procedures:     Objective: Filed Weights   09/19/15 0518  Weight: 80.6 kg (177 lb 11.1 oz)    Intake/Output Summary (Last 24 hours) at 09/19/15 1340 Last data filed at 09/19/15 1300  Gross per 24 hour  Intake 1228.33 ml  Output    250 ml  Net 978.33 ml     Vitals Filed Vitals:   09/18/15 1251 09/18/15 2124 09/19/15  0518 09/19/15 0907  BP: 129/71 118/68 120/87   Pulse: 110 121 119   Temp: 98.7 F (37.1 C) 98.8 F (37.1 C) 98 F (36.7 C)   TempSrc: Oral Oral Oral   Resp: '18 20 20   '$ Height:   '5\' 3"'$  (1.6 m)   Weight:   80.6 kg (177 lb 11.1 oz)   SpO2: 100% 100% 99% 97%    Exam:  General:  Pt is alert, not in acute distress  HEENT: No icterus, No thrush, oral mucosa moist  Cardiovascular: regular rate and rhythm, S1/S2 No murmur  Respiratory: clear to auscultation bilaterally   Abdomen: Soft, +Bowel sounds, abdomen is diffusely tender, non distended, no guarding  MSK: No cyanosis or  clubbing- no pedal edema   Data Reviewed: Basic Metabolic Panel:  Recent Labs Lab 09/28/2015 1217 09/17/15 0510 09/18/15 0320 09/19/15 0539  NA 136 138 138 139  K 2.9* 3.2* 3.0* 2.9*  CL 104 110 107 108  CO2 21* 21* 21* 21*  GLUCOSE 158* 144* 162* 206*  BUN '10 8 9 8  '$ CREATININE 0.48 0.45 0.40* 0.42*  CALCIUM 9.0 8.4* 8.5* 8.5*  MG 1.4*  --   --  1.3*  PHOS 4.4  --   --  3.0   Liver Function Tests:  Recent Labs Lab 10/08/2015 1217  AST 16  ALT 10*  ALKPHOS 82  BILITOT 0.7  PROT 6.1*  ALBUMIN 3.3*   No results for input(s): LIPASE, AMYLASE in the last 168 hours. No results for input(s): AMMONIA in the last 168 hours. CBC:  Recent Labs Lab 09/14/2015 1217 09/17/15 0510 09/18/15 0320 09/19/15 0539  WBC 8.2 2.0* 0.4* 0.1*  NEUTROABS 7.6  --   --  0.0*  HGB 9.8* 8.2* 8.8* 8.4*  HCT 27.4* 23.8* 24.8* 24.0*  MCV 86.4 89.1 88.9 87.9  PLT 185 119* 113* 58*   Cardiac Enzymes: No results for input(s): CKTOTAL, CKMB, CKMBINDEX, TROPONINI in the last 168 hours. BNP (last 3 results)  Recent Labs  06/05/15 2050 08/09/15 0113  BNP 20.5 25.1    ProBNP (last 3 results) No results for input(s): PROBNP in the last 8760 hours.  CBG:  Recent Labs Lab 09/18/15 0734 09/18/15 1144 09/18/15 1656 09/18/15 2116 09/19/15 0801  GLUCAP 138* 135* 137* 166* 184*    Recent Results (from the past 240 hour(s))  Culture, blood (routine x 2)     Status: None (Preliminary result)   Collection Time: 10/08/2015 12:17 PM  Result Value Ref Range Status   Specimen Description PORTA CATH  Final   Special Requests BOTTLES DRAWN AEROBIC AND ANAEROBIC 5CC  Final   Culture   Final    NO GROWTH 2 DAYS Performed at [facility]    Report Status PENDING  Incomplete  Culture, blood (routine x 2)     Status: None (Preliminary result)   Collection Time: 09/30/2015 12:18 PM  Result Value Ref Range Status   Specimen Description RIGHT ANTECUBITAL  Final   Special Requests BOTTLES DRAWN  AEROBIC AND ANAEROBIC 5CC  Final   Culture   Final    NO GROWTH 2 DAYS Performed at [facility]    Report Status PENDING  Incomplete  Urine culture     Status: None   Collection Time: 09/15/2015  2:08 PM  Result Value Ref Range Status   Specimen Description URINE, CLEAN CATCH  Final   Special Requests NONE  Final   Culture   Final  NO GROWTH 1 DAY Performed at [facility]    Report Status 09/17/2015 FINAL  Final     Studies: No results found.  Scheduled Meds:  Scheduled Meds: . antiseptic oral rinse  7 mL Mouth Rinse BID  . budesonide-formoterol  2 puff Inhalation BID  . calcium-vitamin D  1 tablet Oral BID  . cholecalciferol  1,000 Units Oral Daily  . gabapentin  600 mg Oral TID  . insulin aspart  0-9 Units Subcutaneous TID WC  . insulin glargine  5 Units Subcutaneous QHS  . lamoTRIgine  125 mg Oral BID  . magnesium oxide  800 mg Oral TID  . niacin  750 mg Oral QHS  . ondansetron (ZOFRAN) IV  4 mg Intravenous Q6H  . QUEtiapine  800 mg Oral QHS  . simvastatin  40 mg Oral q1800  . sucralfate  1 g Oral TID WC & HS  . tiotropium  18 mcg Inhalation Daily   Continuous Infusions: . 0.9 % NaCl with KCl 20 mEq / L 1,000 mL (09/18/15 1458)    Time spent on care of this patient: 32 min   Cook, MD 09/19/2015, 1:40 PM    [facility] Office  [phone] Pager - Text Page per [email or web address] If 7PM-7AM, please contact night-coverage [email or web address]

## 2015-09-19 NOTE — NC FL2 (Signed)
[place] MEDICAID FL2 LEVEL OF CARE SCREENING TOOL     IDENTIFICATION  Patient Name: KEYSHLA TUNISON Birthdate: 09-May-1961 Sex: female Admission Date (Current Location): 09/23/2015  [place] and [place] Number:  Kathleen Argue [id number] [place] and Address:  [facility],  [place] [address] [place]      Provider Number: [id number]  Attending Physician Name and Address:  Debbe Odea, MD  Relative Name and Phone Number:  Shaune Pascal.  [phone]    Current Level of Care: Hospital Recommended Level of Care: [place] Prior Approval Number:    Date Approved/Denied:   PASRR Number: [id number]  Discharge Plan: SNF    Current Diagnoses: Patient Active Problem List   Diagnosis Date Noted  . Antineoplastic chemotherapy induced pancytopenia ([place]) 09/19/2015  . Lung cancer ([place]) 09/21/2015  . Nausea & vomiting 09/14/2015  . Hypokalemia 09/25/2015  . Encounter for antineoplastic chemotherapy 08/21/2015  . Chemotherapy induced neutropenia ([place]) 08/08/2015  . Chronic hypokalemia 07/22/2015  . Poor venous access   . Cancer of lower lobe of right lung ([place]) 07/02/2015  . Mediastinal lymphadenopathy   . Intractable nausea and vomiting 06/17/2015  . Hypomagnesemia 06/17/2015  . GERD (gastroesophageal reflux disease) 06/17/2015  . SIADH (syndrome of inappropriate ADH production) ([place]) 06/08/2015  . Type 2 diabetes mellitus with hyperglycemia ([place]) 06/07/2015  . Hyperlipidemia 06/07/2015  . Headache 06/06/2015  . Chronic respiratory failure with hypoxia ([place]) 06/06/2015  . Bipolar I disorder ([place]) 06/06/2015  . Obesity 11/29/2014  . Tobacco abuse 11/29/2014  . Noncompliance with medications 11/29/2014  . Hypertension 11/25/2014  . COPD (chronic obstructive pulmonary disease) ([place])   . Axillary abscess 03/03/2013    Orientation RESPIRATION BLADDER Height & Weight     Self, Time, Situation, Place  Normal Continent Weight: 177 lb  11.1 oz (80.6 kg) Height:  '5\' 3"'$  (160 cm)  BEHAVIORAL SYMPTOMS/MOOD NEUROLOGICAL BOWEL NUTRITION STATUS   (n/a)  (NONE) Continent Diet (Diet clear liquid)  AMBULATORY STATUS COMMUNICATION OF NEEDS Skin   Limited Assist Verbally Normal                       Personal Care Assistance Level of Assistance  Feeding, Bathing, Dressing Bathing Assistance: Limited assistance Feeding assistance: Independent Dressing Assistance: Limited assistance     Functional Limitations Info  Sight, Hearing, Speech Sight Info: Adequate Hearing Info: Adequate Speech Info: Adequate    SPECIAL CARE FACTORS FREQUENCY  PT (By licensed PT)                    Contractures Contractures Info: Not present    Additional Factors Info  Code Status, Allergies, Insulin Sliding Scale Code Status Info: FULL code status Allergies Info: No Known Allergies   Insulin Sliding Scale Info: Novolog 3 x a days Isolation Precautions Info: Protective Precautions     Current Medications (09/19/2015):  This is the current hospital active medication list Current Facility-Administered Medications  Medication Dose Route Frequency Provider Last Rate Last Dose  . 0.9 % NaCl with KCl 20 mEq/ L  infusion   Intravenous Continuous Silver Huguenin Elgergawy, MD 100 mL/hr at 09/18/15 1458 1,000 mL at 09/18/15 1458  . acetaminophen (TYLENOL) tablet 650 mg  650 mg Oral Q6H PRN Albertine Patricia, MD       Or  . acetaminophen (TYLENOL) suppository 650 mg  650 mg Rectal Q6H PRN Albertine Patricia, MD      . albuterol (PROVENTIL) (2.5 MG/3ML)  0.083% nebulizer solution 2.5 mg  2.5 mg Nebulization Q4H PRN Albertine Patricia, MD      . antiseptic oral rinse (CPC / CETYLPYRIDINIUM CHLORIDE 0.05%) solution 7 mL  7 mL Mouth Rinse BID Albertine Patricia, MD   7 mL at 09/19/15 0925  . budesonide-formoterol (SYMBICORT) 80-4.5 MCG/ACT inhaler 2 puff  2 puff Inhalation BID Albertine Patricia, MD   2 puff at 09/19/15 0905  . calcium-vitamin D  (OSCAL WITH D) 500-200 MG-UNIT per tablet 1 tablet  1 tablet Oral BID Albertine Patricia, MD   1 tablet at 09/17/15 [phone]  . cholecalciferol (VITAMIN D) tablet 1,000 Units  1,000 Units Oral Daily Albertine Patricia, MD   1,000 Units at 09/17/15 [phone]  . diphenoxylate-atropine (LOMOTIL) 2.5-0.025 MG per tablet 1 tablet  1 tablet Oral QID PRN Albertine Patricia, MD   1 tablet at 09/19/15 0531  . gabapentin (NEURONTIN) capsule 600 mg  600 mg Oral TID Albertine Patricia, MD   600 mg at 09/17/15 2124  . insulin aspart (novoLOG) injection 0-9 Units  0-9 Units Subcutaneous TID WC Albertine Patricia, MD   2 Units at 09/19/15 1204  . insulin glargine (LANTUS) injection 5 Units  5 Units Subcutaneous QHS Silver Huguenin Elgergawy, MD   5 Units at 09/18/15 2110  . ipratropium-albuterol (DUONEB) 0.5-2.5 (3) MG/3ML nebulizer solution 3 mL  3 mL Nebulization Q6H PRN Albertine Patricia, MD      . lamoTRIgine (LAMICTAL) tablet 125 mg  125 mg Oral BID Albertine Patricia, MD   125 mg at 09/18/15 1036  . magnesium oxide (MAG-OX) tablet 800 mg  800 mg Oral TID Albertine Patricia, MD   800 mg at 09/17/15 2132  . morphine 2 MG/ML injection 2 mg  2 mg Intravenous Q3H PRN Rhetta Mura Schorr, NP   2 mg at 09/19/15 1308  . niacin CR capsule 750 mg  750 mg Oral QHS Albertine Patricia, MD   750 mg at 09/17/15 2132  . ondansetron (ZOFRAN) injection 4 mg  4 mg Intravenous Q6H Debbe Odea, MD   4 mg at 09/19/15 0924  . oxyCODONE-acetaminophen (PERCOCET/ROXICET) 5-325 MG per tablet 1-2 tablet  1-2 tablet Oral Q4H PRN Jeryl Columbia, NP   2 tablet at 09/18/15 1042  . prochlorperazine (COMPAZINE) injection 10 mg  10 mg Intravenous Q6H PRN Albertine Patricia, MD   10 mg at 09/19/15 0754  . promethazine (PHENERGAN) injection 25 mg  25 mg Intravenous Q6H PRN Debbe Odea, MD   25 mg at 09/19/15 1204  . QUEtiapine (SEROQUEL XR) 24 hr tablet 800 mg  800 mg Oral QHS Albertine Patricia, MD   800 mg at 09/29/2015 2228  . simvastatin (ZOCOR) tablet  40 mg  40 mg Oral q1800 Albertine Patricia, MD   40 mg at 09/12/2015 2250  . sucralfate (CARAFATE) 1 GM/10ML suspension 1 g  1 g Oral TID WC & HS Albertine Patricia, MD   1 g at 09/15/2015 2225  . tiotropium (SPIRIVA) inhalation capsule 18 mcg  18 mcg Inhalation Daily Albertine Patricia, MD   18 mcg at 09/19/15 0905  . zolpidem (AMBIEN) tablet 5 mg  5 mg Oral QHS PRN Albertine Patricia, MD         Discharge Medications: Please see discharge summary for a list of discharge medications.  Relevant Imaging Results:  Relevant Lab Results:   Additional Information SSN: [id number]  KIDD, Wende Crease,  LCSW

## 2015-09-20 ENCOUNTER — Ambulatory Visit: Payer: Medicaid Other

## 2015-09-20 ENCOUNTER — Ambulatory Visit
Admit: 2015-09-20 | Discharge: 2015-09-20 | Disposition: A | Discharge status: Home / Self Care | Payer: Medicaid Other | Attending: Radiation Oncology | Admitting: Radiation Oncology

## 2015-09-20 ENCOUNTER — Ambulatory Visit: Payer: Medicaid Other | Admitting: Radiation Oncology

## 2015-09-20 LAB — GLUCOSE, CAPILLARY
GLUCOSE-CAPILLARY: 189 mg/dL — AB (ref 65–99)
GLUCOSE-CAPILLARY: 151 mg/dL — AB (ref 65–99)
Glucose-Capillary: 197 mg/dL — ABNORMAL HIGH (ref 65–99)
Glucose-Capillary: 263 mg/dL — ABNORMAL HIGH (ref 65–99)

## 2015-09-20 LAB — BASIC METABOLIC PANEL
ANION GAP: 10 (ref 5–15)
BUN: 7 mg/dL (ref 6–20)
CHLORIDE: 102 mmol/L (ref 101–111)
CO2: 19 mmol/L — AB (ref 22–32)
CREATININE: 0.42 mg/dL — AB (ref 0.44–1.00)
Calcium: 8 mg/dL — ABNORMAL LOW (ref 8.9–10.3)
GFR calc non Af Amer: >60 mL/min (ref >60)
Glucose, Bld: 237 mg/dL — ABNORMAL HIGH (ref 65–99)
POTASSIUM: 2.4 mmol/L — AB (ref 3.5–5.1)
SODIUM: 131 mmol/L — AB (ref 135–145)

## 2015-09-20 LAB — URINALYSIS, ROUTINE W REFLEX MICROSCOPIC
BILIRUBIN URINE: NEGATIVE
Glucose, UA: NEGATIVE mg/dL
Hgb urine dipstick: NEGATIVE
KETONES UR: NEGATIVE mg/dL
LEUKOCYTES UA: NEGATIVE
NITRITE: NEGATIVE
PH: 6.5 (ref 5.0–8.0)
PROTEIN: 100 mg/dL — AB
Specific Gravity, Urine: 1.018 (ref 1.005–1.030)

## 2015-09-20 LAB — C DIFFICILE QUICK SCREEN W PCR REFLEX
C DIFFICILE (CDIFF) INTERP: NEGATIVE
C DIFFICILE (CDIFF) TOXIN: NEGATIVE
C DIFFICLE (CDIFF) ANTIGEN: NEGATIVE

## 2015-09-20 LAB — CBC
HEMATOCRIT: 23.9 % — AB (ref 36.0–46.0)
Hemoglobin: 8.5 g/dL — ABNORMAL LOW (ref 12.0–15.0)
MCH: 30.6 pg (ref 26.0–34.0)
MCHC: 35.6 g/dL (ref 30.0–36.0)
MCV: 86 fL (ref 78.0–100.0)
PLATELETS: 29 10*3/uL — AB (ref 150–400)
RBC: 2.78 MIL/uL — AB (ref 3.87–5.11)
RDW: 17.2 % — AB (ref 11.5–15.5)
WBC: 0.1 10*3/uL — AB (ref 4.0–10.5)

## 2015-09-20 LAB — URINE MICROSCOPIC-ADD ON

## 2015-09-20 LAB — MAGNESIUM: MAGNESIUM: 1.5 mg/dL — AB (ref 1.7–2.4)

## 2015-09-20 MED ORDER — POTASSIUM CHLORIDE 10 MEQ/100ML IV SOLN: 10.0000 meq | INTRAVENOUS | Status: DC

## 2015-09-20 MED ORDER — MAGNESIUM SULFATE 2 GM/50ML IV SOLN
2.0000 g | Freq: Once | INTRAVENOUS | Status: AC
  Administered 2015-09-20: 2 g via INTRAVENOUS
  Filled 2015-09-20: qty 50

## 2015-09-20 MED ORDER — POTASSIUM CHLORIDE 10 MEQ/100ML IV SOLN
10.0000 meq | INTRAVENOUS | Status: AC
  Administered 2015-09-20 (×6): 10 meq via INTRAVENOUS
  Filled 2015-09-20 (×6): qty 100

## 2015-09-20 MED ORDER — DEXTROSE 5 % IV SOLN
2.0000 g | Freq: Three times a day (TID) | INTRAVENOUS | Status: DC
  Administered 2015-09-20 – 2015-09-24 (×11): 2 g via INTRAVENOUS
  Filled 2015-09-20 (×16): qty 2

## 2015-09-20 MED ORDER — MAGNESIUM SULFATE 4 GM/100ML IV SOLN
4.0000 g | Freq: Once | INTRAVENOUS | Status: AC
  Administered 2015-09-20: 4 g via INTRAVENOUS
  Filled 2015-09-20: qty 100

## 2015-09-20 MED ORDER — POTASSIUM CHLORIDE IN NACL 20-0.9 MEQ/L-% IV SOLN
INTRAVENOUS | Status: AC
  Administered 2015-09-20 (×2): via INTRAVENOUS
  Filled 2015-09-20 (×3): qty 1000

## 2015-09-20 MED ORDER — TBO-FILGRASTIM 480 MCG/0.8ML ~~LOC~~ SOSY
480.0000 ug | PREFILLED_SYRINGE | Freq: Once | SUBCUTANEOUS | Status: AC
  Administered 2015-09-20: 480 ug via SUBCUTANEOUS
  Filled 2015-09-20: qty 0.8

## 2015-09-20 MED ORDER — VANCOMYCIN HCL IN DEXTROSE 1-5 GM/200ML-% IV SOLN
1000.0000 mg | Freq: Three times a day (TID) | INTRAVENOUS | Status: DC
  Administered 2015-09-20 – 2015-09-22 (×5): 1000 mg via INTRAVENOUS
  Filled 2015-09-20 (×5): qty 200

## 2015-09-20 NOTE — Progress Notes (Signed)
ANTIBIOTIC CONSULT NOTE - INITIAL  Pharmacy Consult for vancomycin and cefepime Indication: sepsis  No Known Allergies  Patient Measurements: Height: '5\' 3"'$  (160 cm) Weight: 177 lb 11.1 oz (80.6 kg) IBW/kg (Calculated) : 52.4  Vital Signs: Temp: 100.3 F (37.9 C) (02/09 1402) Temp Source: Oral (02/09 1402) BP: 126/71 mmHg (02/09 1402) Pulse Rate: 133 (02/09 1402) Intake/Output from previous day: 02/08 0701 - 02/09 0700 In: 600 [P.O.:600] Out: 100 [Urine:100] Intake/Output from this shift: Total I/O In: 600 [P.O.:600] Out: -   Labs:  Recent Labs  09/18/15 0320 09/19/15 0539 09/19/15 1600 09/20/15 0521  WBC 0.4* 0.1*  --  0.1*  HGB 8.8* 8.4*  --  8.5*  PLT 113* 58*  --  29*  CREATININE 0.40* 0.42* 0.45 0.42*   Estimated Creatinine Clearance: 79.9 mL/min (by C-G formula based on Cr of 0.42). No results for input(s): VANCOTROUGH, VANCOPEAK, VANCORANDOM, GENTTROUGH, GENTPEAK, GENTRANDOM, TOBRATROUGH, TOBRAPEAK, TOBRARND, AMIKACINPEAK, AMIKACINTROU, AMIKACIN in the last 72 hours.   Microbiology: Recent Results (from the past 720 hour(s))  Culture, blood (routine x 2)     Status: None (Preliminary result)   Collection Time: 09/12/2015 12:17 PM  Result Value Ref Range Status   Specimen Description PORTA CATH  Final   Special Requests BOTTLES DRAWN AEROBIC AND ANAEROBIC 5CC  Final   Culture   Final    NO GROWTH 3 DAYS Performed at [facility]    Report Status PENDING  Incomplete  Culture, blood (routine x 2)     Status: None (Preliminary result)   Collection Time: 10/05/2015 12:18 PM  Result Value Ref Range Status   Specimen Description RIGHT ANTECUBITAL  Final   Special Requests BOTTLES DRAWN AEROBIC AND ANAEROBIC 5CC  Final   Culture   Final    NO GROWTH 3 DAYS Performed at [facility]    Report Status PENDING  Incomplete  Urine culture     Status: None   Collection Time: 09/23/2015  2:08 PM  Result Value Ref Range Status   Specimen Description  URINE, CLEAN CATCH  Final   Special Requests NONE  Final   Culture   Final    NO GROWTH 1 DAY Performed at [facility]    Report Status 09/17/2015 FINAL  Final    Medical History: Past Medical History  Diagnosis Date  . Pancreatitis   . Anemia   . Bipolar 1 disorder ([place])   . Alcohol abuse   . 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 COPD (chronic obstructive pulmonary disease) ([place])   . Hypotension   . Major depressive disorder ([place])   . Cocaine abuse   . Chronic back pain   . Renal disorder   . Acidosis   . Hyperkalemia   . Abscess   . Acute encephalopathy   . Septic shock ([place])   . Chemotherapy induced neutropenia ([place]) 08/08/2015  . Diabetes mellitus   . Lung cancer [facility])     Assessment: Patient's a 55 y.o F with small cell lung cancer undergoing chemotherapy treatment with last cycle given on 09/13/15 presented to the ED on 2/5 with c/o n/v.  ANC 0 on 2/8 and patient is now febrile.  To start broad abx for empiric coverage.  - Tmax 101, wbc 0.1, scr 0.42 (crcl~100, rounded scr to 0.8)  Antimicrobials this admission: 2/9 vanc>> 2/9 cefepime>>  Levels/dose changes this admission: n/a  Microbiology results: 2/5 bcx x2: ngtd 2/5 ucx: neg  FINAL 2/9 bcx x2:   Goal of Therapy:  Vancomycin trough level 15-20 mcg/ml  Plan:  - cefepime 2gm IV q8h - vancomycin 1gm IV q8h - f/u cultures  Lounell Schumacher P 09/20/2015,3:06 PM

## 2015-09-20 NOTE — Progress Notes (Signed)
Critical lab potassium 2.4. On call provider notified. No new orders at this time. Will continue to monitor.

## 2015-09-20 NOTE — Progress Notes (Signed)
CSW continuing to follow.   CSW received notification from [facility] that facility can offer pt a bed when pt medically ready for discharge.  CSW spoke with pt RN and RN reports pt feeling poorly at this time. Pt resting.   CSW notified pt sister, [name] via telephone and will continue discussion with pt regarding plan.   CSW confirmed with [place] that facility has bed available for pt tomorrow.   CSW to continue to follow.  Alison Murray, MSW, [place] Work [phone]

## 2015-09-20 NOTE — Progress Notes (Addendum)
[facility] Progress Note   Sarah Leonard  VZD:[id number]  DOB: [birth year]  DOA: 09/19/2015 PCP: Moreen Fowler  Brief narrative: Sarah Leonard is a 55 y.o. female presenting from [facility] with a recent diagnosis of small cell lung cancer and history of COPD, tobacco abuse depression, diabetes mellitus, hyperlipidemia, bipolar disorder, alcohol and cocaine abuse in the past who presents to the hospital with vomiting and diarrhea occurring post-chemotherapy. There is a note from the day of admission that also states that she also had a temperature 101.7 but there have been no fevers noted since she has been admitted to the hospital.  She is undergoing chemotherapy which is being given by Dr. Julien Nordmann. She is also receiving radiotherapy under the care of Dr. Tammi Klippel.   Subjective: Vomiting has resolved and she is tolerating clear liquids right now. She states she continues to have loose stools but is unable to tell me how many are how often. She appears to be in quite a lot of discomfort and is a poor historian. States that she continues to have diffuse abdominal discomfort. No fevers or chills. No cough shortness of breath or chest pain.  Assessment/Plan: Principal Problem:   Nausea & vomiting/mild diarrhea -As a result of chemotherapy -changed Zofran to every 6 hours routine rather than when necessary and added IV Phenergan -Continue IV fluids and pain control  Active Problems:  ADDENDUM: called by RN at 2:55- patient has low grade fevers today. Check blood cultures, UA, stool for c diff (has had 3 loose BMs today) and GI pathogen panel. Start Cefepime and Vancomycin. Will add Flagyl if C diff positive  Severe Hypokalemia/hypomagnesemia -Potassium 2.4, magnesium 1.5  - Unable to take oral replacement due to vomiting-have ordered IV potassium  and magnesium replacement   Antineoplastic chemotherapy induced pancytopenia  - Received a dose of Granix   -Holding heparin and Lovenox due to drop in platelets which are now 29  Small cell lung cancer- (T1a, N2, M0) -Under the care of Dr. Julien Nordmann and Dr. Tammi Klippel -Received 3 cycles of chemotherapy- last cycle was 1/31    COPD (chronic obstructive pulmonary disease)  -Appears stable at this time - Is receiving Spiriva and nebulizer treatments    Hyperlipidemia -Is receiving Zocor and niacin    GERD (gastroesophageal reflux disease) -Is receiving sucralfate  Diabetes mellitus -We can continue low-dose Lantus and sliding scale insulin    Antibiotics: Anti-infectives    None     Code Status:     Code Status Orders        Start     Ordered   09/12/2015 1746  Full code   Continuous     10/09/2015 1745    Code Status History    Date Active Date Inactive Code Status Order ID Comments User Context   07/22/2015 11:25 AM 07/25/2015  5:45 PM Full Code [id number]  Samella Parr, NP ED   06/17/2015  5:04 AM 06/22/2015  5:59 PM Full Code [id number]  Ivor Costa, MD ED   06/06/2015  3:10 AM 06/11/2015 10:05 PM Full Code [id number]  Edwin Dada, MD Inpatient   11/25/2014  2:13 AM 11/29/2014  8:07 PM Full Code [id number]  Rise Patience, MD ED   02/28/2013 11:55 AM 03/05/2013  3:08 PM Full Code [id number]  Marijean Heath, NP ED     Family Communication:  Disposition Plan: Unable to determine discharge date -possibly another 2 days  DVT prophylaxis:  SCDs  Consultants:   Procedures:     Objective: Filed Weights   09/19/15 0518  Weight: 80.6 kg (177 lb 11.1 oz)    Intake/Output Summary (Last 24 hours) at 09/20/15 1153 Last data filed at 09/20/15 1001  Gross per 24 hour  Intake    840 ml  Output    100 ml  Net    740 ml     Vitals Filed Vitals:   09/19/15 1430 09/19/15 2205 09/20/15 0532 09/20/15 0748  BP: 141/70 127/71 92/64   Pulse: 110 120 124   Temp: 99.8 F (37.7 C) 101 F (38.3 C) 100 F (37.8 C)   TempSrc: Oral Oral Oral   Resp: '20 20 20   '$ Height:       Weight:      SpO2: 100% 100% 100% 97%    Exam:  General:  Pt is alert, not in acute distress  HEENT: No icterus, No thrush, oral mucosa moist  Cardiovascular: regular rate and rhythm, S1/S2 No murmur  Respiratory: clear to auscultation bilaterally   Abdomen: Soft, +Bowel sounds, abdomen is diffusely tender, non distended, no guarding  MSK: No cyanosis or clubbing- no pedal edema   Data Reviewed: Basic Metabolic Panel:  Recent Labs Lab 09/23/2015 1217 09/17/15 0510 09/18/15 0320 09/19/15 0539 09/19/15 1600 09/20/15 0521  NA 136 138 138 139 134* 131*  K 2.9* 3.2* 3.0* 2.9* 2.6* 2.4*  CL 104 110 107 108 104 102  CO2 21* 21* 21* 21* 20* 19*  GLUCOSE 158* 144* 162* 206* 229* 237*  BUN '10 8 9 8 9 7  '$ CREATININE 0.48 0.45 0.40* 0.42* 0.45 0.42*  CALCIUM 9.0 8.4* 8.5* 8.5* 7.9* 8.0*  MG 1.4*  --   --  1.3* 1.6* 1.5*  PHOS 4.4  --   --  3.0  --   --    Liver Function Tests:  Recent Labs Lab 09/27/2015 1217  AST 16  ALT 10*  ALKPHOS 82  BILITOT 0.7  PROT 6.1*  ALBUMIN 3.3*   No results for input(s): LIPASE, AMYLASE in the last 168 hours. No results for input(s): AMMONIA in the last 168 hours. CBC:  Recent Labs Lab 10/08/2015 1217 09/17/15 0510 09/18/15 0320 09/19/15 0539 09/20/15 0521  WBC 8.2 2.0* 0.4* 0.1* 0.1*  NEUTROABS 7.6  --   --  0.0*  --   HGB 9.8* 8.2* 8.8* 8.4* 8.5*  HCT 27.4* 23.8* 24.8* 24.0* 23.9*  MCV 86.4 89.1 88.9 87.9 86.0  PLT 185 119* 113* 58* 29*   Cardiac Enzymes: No results for input(s): CKTOTAL, CKMB, CKMBINDEX, TROPONINI in the last 168 hours. BNP (last 3 results)  Recent Labs  06/05/15 2050 08/09/15 0113  BNP 20.5 25.1    ProBNP (last 3 results) No results for input(s): PROBNP in the last 8760 hours.  CBG:  Recent Labs Lab 09/19/15 0801 09/19/15 1155 09/19/15 1645 09/19/15 2152 09/20/15 0757  GLUCAP 184* 162* 183* 202* 197*    Recent Results (from the past 240 hour(s))  Culture, blood (routine x 2)      Status: None (Preliminary result)   Collection Time: 10/01/2015 12:17 PM  Result Value Ref Range Status   Specimen Description PORTA CATH  Final   Special Requests BOTTLES DRAWN AEROBIC AND ANAEROBIC 5CC  Final   Culture   Final    NO GROWTH 3 DAYS Performed at [facility]    Report Status PENDING  Incomplete  Culture, blood (routine x 2)     Status:  None (Preliminary result)   Collection Time: 09/15/2015 12:18 PM  Result Value Ref Range Status   Specimen Description RIGHT ANTECUBITAL  Final   Special Requests BOTTLES DRAWN AEROBIC AND ANAEROBIC 5CC  Final   Culture   Final    NO GROWTH 3 DAYS Performed at [facility]    Report Status PENDING  Incomplete  Urine culture     Status: None   Collection Time: 10/06/2015  2:08 PM  Result Value Ref Range Status   Specimen Description URINE, CLEAN CATCH  Final   Special Requests NONE  Final   Culture   Final    NO GROWTH 1 DAY Performed at [facility]    Report Status 09/17/2015 FINAL  Final     Studies: No results found.  Scheduled Meds:  Scheduled Meds: . antiseptic oral rinse  7 mL Mouth Rinse BID  . budesonide-formoterol  2 puff Inhalation BID  . calcium-vitamin D  1 tablet Oral BID  . cholecalciferol  1,000 Units Oral Daily  . gabapentin  600 mg Oral TID  . insulin aspart  0-9 Units Subcutaneous TID WC  . insulin glargine  5 Units Subcutaneous QHS  . lamoTRIgine  125 mg Oral BID  . magnesium oxide  800 mg Oral TID  . magnesium sulfate 1 - 4 g bolus IVPB  2 g Intravenous Once  . magnesium sulfate 1 - 4 g bolus IVPB  4 g Intravenous Once  . niacin  750 mg Oral QHS  . ondansetron (ZOFRAN) IV  4 mg Intravenous Q6H  . potassium chloride  10 mEq Intravenous Q1 Hr x 6  . QUEtiapine  800 mg Oral QHS  . simvastatin  40 mg Oral q1800  . sucralfate  1 g Oral TID WC & HS  . tiotropium  18 mcg Inhalation Daily   Continuous Infusions: . 0.9 % NaCl with KCl 20 mEq / L      Time spent on care of this  patient: 35 min   [place], MD 09/20/2015, 11:53 AM  LOS: 1 day   [facility] Office  [phone] Pager - Text Page per [email or web address] If 7PM-7AM, please contact night-coverage [email or web address]

## 2015-09-20 NOTE — Progress Notes (Signed)
PT Cancellation Note  Patient Details Name: Sarah Leonard MRN: [id number] DOB: 12/18/1960   Cancelled Treatment:    Reason Eval/Treat Not Completed: Medical issues which prohibited therapy (multiple critical values this AM. check back this P as schedule allows.)   Claretha Cooper 09/20/2015, 8:05 AM Tresa Endo PT [phone]

## 2015-09-20 NOTE — Progress Notes (Signed)
Critical lab value PLT =29 and WBC = 0.1. On call provider notified. No new orders at this time. Will continue to monitor.

## 2015-09-20 NOTE — NC FL2 (Cosign Needed)
[place] MEDICAID FL2 LEVEL OF CARE SCREENING TOOL     IDENTIFICATION  Patient Name: Sarah Leonard Birthdate: 1960-09-03 Sex: female Admission Date (Current Location): 09/15/2015  [place] and [place] Number:  Kathleen Argue [id number] [place] and Address:  [facility],  [place] [address] [place]      Provider Number: [id number]  Attending Physician Name and Address:  Debbe Odea, MD  Relative Name and Phone Number:  Shaune Pascal.  [phone]    Current Level of Care: Hospital Recommended Level of Care: [place] Prior Approval Number:    Date Approved/Denied:   PASRR Number: [id number]  Discharge Plan: SNF    Current Diagnoses: Patient Active Problem List   Diagnosis Date Noted  . Antineoplastic chemotherapy induced pancytopenia ([place]) 09/19/2015  . Nausea and vomiting 09/19/2015  . Lung cancer ([place]) 09/15/2015  . Nausea & vomiting 09/13/2015  . Hypokalemia 09/18/2015  . Encounter for antineoplastic chemotherapy 08/21/2015  . Chemotherapy induced neutropenia ([place]) 08/08/2015  . Chronic hypokalemia 07/22/2015  . Poor venous access   . Cancer of lower lobe of right lung ([place]) 07/02/2015  . Mediastinal lymphadenopathy   . Intractable nausea and vomiting 06/17/2015  . Hypomagnesemia 06/17/2015  . GERD (gastroesophageal reflux disease) 06/17/2015  . SIADH (syndrome of inappropriate ADH production) ([place]) 06/08/2015  . Type 2 diabetes mellitus with hyperglycemia ([place]) 06/07/2015  . Hyperlipidemia 06/07/2015  . Headache 06/06/2015  . Chronic respiratory failure with hypoxia ([place]) 06/06/2015  . Bipolar I disorder ([place]) 06/06/2015  . Obesity 11/29/2014  . Tobacco abuse 11/29/2014  . Noncompliance with medications 11/29/2014  . Hypertension 11/25/2014  . COPD (chronic obstructive pulmonary disease) ([place])   . Axillary abscess 03/03/2013    Orientation RESPIRATION BLADDER Height & Weight     Self, Time, Situation,  Place  Normal Continent Weight: 177 lb 11.1 oz (80.6 kg) Height:  '5\' 3"'$  (160 cm)  BEHAVIORAL SYMPTOMS/MOOD NEUROLOGICAL BOWEL NUTRITION STATUS   (n/a)  (NONE) Continent Diet (Diet clear liquid)  AMBULATORY STATUS COMMUNICATION OF NEEDS Skin   Limited Assist Verbally Normal                       Personal Care Assistance Level of Assistance  Feeding, Bathing, Dressing Bathing Assistance: Limited assistance Feeding assistance: Independent Dressing Assistance: Limited assistance     Functional Limitations Info  Sight, Hearing, Speech Sight Info: Adequate Hearing Info: Adequate Speech Info: Adequate    SPECIAL CARE FACTORS FREQUENCY  PT (By licensed PT)                    Contractures Contractures Info: Not present    Additional Factors Info  Code Status, Allergies, Insulin Sliding Scale Code Status Info: FULL code status Allergies Info: No Known Allergies   Insulin Sliding Scale Info: Novolog 3 x a days Isolation Precautions Info: Protective Precautions     Current Medications (09/20/2015):  This is the current hospital active medication list Current Facility-Administered Medications  Medication Dose Route Frequency Provider Last Rate Last Dose  . 0.9 % NaCl with KCl 20 mEq/ L  infusion   Intravenous Continuous Debbe Odea, MD 100 mL/hr at 09/20/15 1212    . acetaminophen (TYLENOL) tablet 650 mg  650 mg Oral Q6H PRN Albertine Patricia, MD       Or  . acetaminophen (TYLENOL) suppository 650 mg  650 mg Rectal Q6H PRN Albertine Patricia, MD      . albuterol (PROVENTIL) (  2.5 MG/3ML) 0.083% nebulizer solution 2.5 mg  2.5 mg Nebulization Q4H PRN Albertine Patricia, MD      . antiseptic oral rinse (CPC / CETYLPYRIDINIUM CHLORIDE 0.05%) solution 7 mL  7 mL Mouth Rinse BID Albertine Patricia, MD   7 mL at 09/19/15 2331  . budesonide-formoterol (SYMBICORT) 80-4.5 MCG/ACT inhaler 2 puff  2 puff Inhalation BID Albertine Patricia, MD   2 puff at 09/20/15 0747  .  calcium-vitamin D (OSCAL WITH D) 500-200 MG-UNIT per tablet 1 tablet  1 tablet Oral BID Albertine Patricia, MD   1 tablet at 09/17/15 [phone]  . ceFEPIme (MAXIPIME) 2 g in dextrose 5 % 50 mL IVPB  2 g Intravenous Q8H Anh P Pham, RPH      . cholecalciferol (VITAMIN D) tablet 1,000 Units  1,000 Units Oral Daily Albertine Patricia, MD   1,000 Units at 09/17/15 [phone]  . diphenoxylate-atropine (LOMOTIL) 2.5-0.025 MG per tablet 1 tablet  1 tablet Oral QID PRN Albertine Patricia, MD   1 tablet at 09/19/15 2331  . gabapentin (NEURONTIN) capsule 600 mg  600 mg Oral TID Albertine Patricia, MD   600 mg at 09/20/15 1006  . insulin aspart (novoLOG) injection 0-9 Units  0-9 Units Subcutaneous TID WC Albertine Patricia, MD   2 Units at 09/20/15 1231  . insulin glargine (LANTUS) injection 5 Units  5 Units Subcutaneous QHS Albertine Patricia, MD   5 Units at 09/19/15 2331  . ipratropium-albuterol (DUONEB) 0.5-2.5 (3) MG/3ML nebulizer solution 3 mL  3 mL Nebulization Q6H PRN Albertine Patricia, MD      . lamoTRIgine (LAMICTAL) tablet 125 mg  125 mg Oral BID Albertine Patricia, MD   125 mg at 09/20/15 1007  . magnesium oxide (MAG-OX) tablet 800 mg  800 mg Oral TID Albertine Patricia, MD   800 mg at 09/17/15 2132  . magnesium sulfate IVPB 2 g 50 mL  2 g Intravenous Once Debbe Odea, MD      . morphine 2 MG/ML injection 2 mg  2 mg Intravenous Q3H PRN Rhetta Mura Schorr, NP   2 mg at 09/20/15 1358  . niacin CR capsule 750 mg  750 mg Oral QHS Albertine Patricia, MD   750 mg at 09/17/15 2132  . ondansetron (ZOFRAN) injection 4 mg  4 mg Intravenous Q6H Debbe Odea, MD   4 mg at 09/20/15 0958  . oxyCODONE-acetaminophen (PERCOCET/ROXICET) 5-325 MG per tablet 1-2 tablet  1-2 tablet Oral Q4H PRN Jeryl Columbia, NP   2 tablet at 09/18/15 1042  . prochlorperazine (COMPAZINE) injection 10 mg  10 mg Intravenous Q6H PRN Albertine Patricia, MD   10 mg at 09/20/15 1303  . promethazine (PHENERGAN) injection 25 mg  25 mg Intravenous  Q6H PRN Debbe Odea, MD   25 mg at 09/20/15 0859  . QUEtiapine (SEROQUEL XR) 24 hr tablet 800 mg  800 mg Oral QHS Albertine Patricia, MD   800 mg at 09/30/2015 2228  . simvastatin (ZOCOR) tablet 40 mg  40 mg Oral q1800 Albertine Patricia, MD   40 mg at 09/17/2015 2250  . sucralfate (CARAFATE) 1 GM/10ML suspension 1 g  1 g Oral TID WC & HS Albertine Patricia, MD   1 g at 09/20/15 0845  . tiotropium (SPIRIVA) inhalation capsule 18 mcg  18 mcg Inhalation Daily Albertine Patricia, MD   18 mcg at 09/20/15 0746  . vancomycin (VANCOCIN) IVPB 1000  mg/200 mL premix  1,000 mg Intravenous Q8H Anh P Pham, RPH   1,000 mg at 09/20/15 1641  . zolpidem (AMBIEN) tablet 5 mg  5 mg Oral QHS PRN Albertine Patricia, MD         Discharge Medications: Please see discharge summary for a list of discharge medications.  Relevant Imaging Results:  Relevant Lab Results:   Additional Information SSN: [id number]  KIDD, SUZANNA A, LCSW

## 2015-09-20 NOTE — Progress Notes (Signed)
Inpatient Diabetes Program Recommendations  AACE/ADA: New Consensus Statement on Inpatient Glycemic Control (2015)  Target Ranges:  Prepandial:   less than 140 mg/dL      Peak postprandial:   less than 180 mg/dL (1-2 hours)      Critically ill patients:  140 - 180 mg/dL    Results for DENAJA, VERHOEVEN (MRN [id number]) as of 09/20/2015 09:38  Ref. Range 09/19/2015 08:01 09/19/2015 11:55 09/19/2015 16:45 09/19/2015 21:52  Glucose-Capillary Latest Ref Range: 65-99 mg/dL 184 (H) 162 (H) 183 (H) 202 (H)   Results for KIRBY, ARGUETA (MRN [id number]) as of 09/20/2015 09:38  Ref. Range 09/20/2015 07:57  Glucose-Capillary Latest Ref Range: 65-99 mg/dL 197 (H)    Admit with: Nausea & Vomiting/ Lung Cancer  History: DM, Pancreatitis, ETOH, COPD  Home DM Meds: Lantus 10 units QHS       Novolog 5 units tidwc  Current Insulin Orders: Lantus 5 units QHS      Novolog Sensitive SSI (0-9 units) TID AC      MD- Fasting glucose levels elevated.    Please consider increasing Lantus to 10 units QHS (home dose)     --Will follow patient during hospitalization--  Wyn Quaker RN, MSN, CDE Diabetes Coordinator Inpatient Glycemic Control Team Team Pager: [phone] (8a-5p)

## 2015-09-20 NOTE — Care Management Note (Signed)
Case Management Note  Patient Details  Name: Sarah Leonard MRN: [id number] Date of Birth: 05-31-1961  Subjective/Objective:              55 yo admitted with Nausea and Vomiting      Action/Plan: From [facility].  Pt to DC to SNF.  Expected Discharge Date:   (unknown)               Expected Discharge Plan:  Skilled Nursing Facility  In-House Referral:  Clinical Social Work  Discharge planning Services  CM Consult  Post Acute Care Choice:    Choice offered to:     DME Arranged:    DME Agency:     HH Arranged:    [place] Agency:     Status of Service:  In process, will continue to follow  Medicare Important Message Given:    Date Medicare IM Given:    Medicare IM give by:    Date Additional Medicare IM Given:    Additional Medicare Important Message give by:     If discussed at [place] of Stay Meetings, dates discussed:    Additional CommentsLynnell Catalan, RN 09/20/2015, 4:07 PM [phone]

## 2015-09-21 ENCOUNTER — Ambulatory Visit: Payer: Medicaid Other | Admitting: Radiation Oncology

## 2015-09-21 ENCOUNTER — Ambulatory Visit: Payer: Medicaid Other

## 2015-09-21 DIAGNOSIS — R112 Nausea with vomiting, unspecified: Principal | ICD-10-CM

## 2015-09-21 LAB — BASIC METABOLIC PANEL
Anion gap: 8 (ref 5–15)
Anion gap: 9 (ref 5–15)
BUN: 7 mg/dL (ref 6–20)
BUN: 7 mg/dL (ref 6–20)
CALCIUM: 7.4 mg/dL — AB (ref 8.9–10.3)
CO2: 20 mmol/L — AB (ref 22–32)
CO2: 20 mmol/L — ABNORMAL LOW (ref 22–32)
CREATININE: 0.3 mg/dL — AB (ref 0.44–1.00)
CREATININE: 0.44 mg/dL (ref 0.44–1.00)
Calcium: 7.5 mg/dL — ABNORMAL LOW (ref 8.9–10.3)
Chloride: 100 mmol/L — ABNORMAL LOW (ref 101–111)
Chloride: 101 mmol/L (ref 101–111)
GLUCOSE: 179 mg/dL — AB (ref 65–99)
Glucose, Bld: 196 mg/dL — ABNORMAL HIGH (ref 65–99)
POTASSIUM: 2 mmol/L — AB (ref 3.5–5.1)
Potassium: <2 mmol/L — CL (ref 3.5–5.1)
SODIUM: 130 mmol/L — AB (ref 135–145)
Sodium: 128 mmol/L — ABNORMAL LOW (ref 135–145)

## 2015-09-21 LAB — GASTROINTESTINAL PANEL BY PCR, STOOL (REPLACES STOOL CULTURE)
ADENOVIRUS F40/41: NOT DETECTED
ASTROVIRUS: NOT DETECTED
CAMPYLOBACTER SPECIES: NOT DETECTED
CRYPTOSPORIDIUM: NOT DETECTED
Cyclospora cayetanensis: NOT DETECTED
E. coli O157: NOT DETECTED
ENTEROAGGREGATIVE E COLI (EAEC): NOT DETECTED
ENTEROPATHOGENIC E COLI (EPEC): NOT DETECTED
ENTEROTOXIGENIC E COLI (ETEC): NOT DETECTED
Entamoeba histolytica: NOT DETECTED
GIARDIA LAMBLIA: NOT DETECTED
Norovirus GI/GII: NOT DETECTED
PLESIMONAS SHIGELLOIDES: NOT DETECTED
Rotavirus A: NOT DETECTED
Salmonella species: NOT DETECTED
Sapovirus (I, II, IV, and V): NOT DETECTED
Shiga like toxin producing E coli (STEC): NOT DETECTED
Shigella/Enteroinvasive E coli (EIEC): NOT DETECTED
VIBRIO SPECIES: NOT DETECTED
Vibrio cholerae: NOT DETECTED
YERSINIA ENTEROCOLITICA: NOT DETECTED

## 2015-09-21 LAB — GLUCOSE, CAPILLARY
GLUCOSE-CAPILLARY: 172 mg/dL — AB (ref 65–99)
Glucose-Capillary: 162 mg/dL — ABNORMAL HIGH (ref 65–99)
Glucose-Capillary: 145 mg/dL — ABNORMAL HIGH (ref 65–99)
Glucose-Capillary: 164 mg/dL — ABNORMAL HIGH (ref 65–99)

## 2015-09-21 LAB — CBC
HCT: 20.8 % — ABNORMAL LOW (ref 36.0–46.0)
HCT: 20.2 % — ABNORMAL LOW (ref 36.0–46.0)
Hemoglobin: 7.6 g/dL — ABNORMAL LOW (ref 12.0–15.0)
Hemoglobin: 7.4 g/dL — ABNORMAL LOW (ref 12.0–15.0)
MCH: 30.6 pg (ref 26.0–34.0)
MCH: 30.7 pg (ref 26.0–34.0)
MCHC: 36.5 g/dL — AB (ref 30.0–36.0)
MCHC: 36.6 g/dL — AB (ref 30.0–36.0)
MCV: 83.9 fL (ref 78.0–100.0)
MCV: 83.8 fL (ref 78.0–100.0)
PLATELETS: 6 10*3/uL — AB (ref 150–400)
RBC: 2.48 MIL/uL — ABNORMAL LOW (ref 3.87–5.11)
RBC: 2.41 MIL/uL — AB (ref 3.87–5.11)
RDW: 16.7 % — ABNORMAL HIGH (ref 11.5–15.5)
RDW: 16.6 % — ABNORMAL HIGH (ref 11.5–15.5)
WBC: <0.1 10*3/uL — CL (ref 4.0–10.5)
WBC: <0.1 10*3/uL — CL (ref 4.0–10.5)

## 2015-09-21 LAB — CULTURE, BLOOD (ROUTINE X 2)
Culture: NO GROWTH
Culture: NO GROWTH

## 2015-09-21 LAB — PREPARE RBC (CROSSMATCH)

## 2015-09-21 LAB — ABO/RH: ABO/RH(D): O POS

## 2015-09-21 MED ORDER — SODIUM CHLORIDE 0.9 % IV SOLN
Freq: Once | INTRAVENOUS | Status: AC
  Administered 2015-09-21: 15:00:00 via INTRAVENOUS

## 2015-09-21 MED ORDER — POTASSIUM CHLORIDE IN NACL 20-0.9 MEQ/L-% IV SOLN
INTRAVENOUS | Status: AC
  Administered 2015-09-21 – 2015-09-23 (×4): via INTRAVENOUS
  Filled 2015-09-21 (×9): qty 1000

## 2015-09-21 MED ORDER — POTASSIUM CHLORIDE 10 MEQ/100ML IV SOLN
10.0000 meq | INTRAVENOUS | Status: AC
  Administered 2015-09-21 (×6): 10 meq via INTRAVENOUS
  Filled 2015-09-21 (×6): qty 100

## 2015-09-21 MED ORDER — SODIUM CHLORIDE 0.9% FLUSH
10.0000 mL | INTRAVENOUS | Status: DC | PRN
  Administered 2015-09-23: 10 mL
  Filled 2015-09-21: qty 40

## 2015-09-21 MED ORDER — SODIUM CHLORIDE 0.9% FLUSH
10.0000 mL | Freq: Two times a day (BID) | INTRAVENOUS | Status: DC
  Administered 2015-09-23: 10 mL

## 2015-09-21 NOTE — Progress Notes (Signed)
[facility] Progress Note   Sarah Leonard  EGB:[id number]  DOB: 06-17-1961  DOA: 09/26/2015 PCP: Moreen Fowler  Brief narrative: Sarah Leonard is a 55 y.o. female presenting from [facility] with a recent diagnosis of small cell lung cancer and history of COPD, tobacco abuse depression, diabetes mellitus, hyperlipidemia, bipolar disorder, alcohol and cocaine abuse in the past who presents to the hospital with vomiting and diarrhea occurring post-chemotherapy. There is a note from the day of admission that also states that she also had a temperature 101.7 but there have been no fevers noted since she has been admitted to the hospital.  She is undergoing chemotherapy which is being given by Dr. Julien Nordmann. She is also receiving radiotherapy under the care of Dr. Tammi Klippel.   Subjective: Still having watery diarrhea. tolerating clear liquid diet. No cough, sore throat, sinus drainage, dysuria.   Assessment/Plan: Principal Problem:   Nausea & vomiting/mild diarrhea -As a result of chemotherapy -changed Zofran to every 6 hours routine rather than when necessary and added IV Phenergan -Continue IV fluids and pain control  Active Problems:  Neutropenic fever-  - Started on 2/9- f/u blood cultures - UA and c diff-negative - GI pathogen panel also negative -  Started Cefepime and Vancomycin   Severe Hypokalemia/hypomagnesemia - continuing to replace agressively  - Unable to take oral replacement due to vomiting-have ordered IV potassium  and magnesium replacement    Antineoplastic chemotherapy induced pancytopenia  -Given Neulasta after chemo and then received Granix x 2 (last one  2/9) -Holding heparin and Lovenox due to drop in platelets   - will need to transfuse PRBC and platelets today  Small cell lung cancer- (T1a, N2, M0) -Under the care of Dr. Julien Nordmann and Dr. Tammi Klippel -Received 3 cycles of chemotherapy- last cycle was 1/31    COPD (chronic  obstructive pulmonary disease)  -Appears stable at this time - Is receiving Spiriva and nebulizer treatments    Hyperlipidemia -Is receiving Zocor and niacin    GERD (gastroesophageal reflux disease) -Is receiving sucralfate  Diabetes mellitus -We can continue low-dose Lantus and sliding scale insulin    Antibiotics: Anti-infectives    Start     Dose/Rate Route Frequency Ordered Stop   09/20/15 1630  vancomycin (VANCOCIN) IVPB 1000 mg/200 mL premix     1,000 mg 200 mL/hr over 60 Minutes Intravenous Every 8 hours 09/20/15 1521     09/20/15 1600  ceFEPIme (MAXIPIME) 2 g in dextrose 5 % 50 mL IVPB     2 g 100 mL/hr over 30 Minutes Intravenous Every 8 hours 09/20/15 1521       Code Status:     Code Status Orders        Start     Ordered   10/08/2015 1746  Full code   Continuous     09/27/2015 1745    Code Status History    Date Active Date Inactive Code Status Order ID Comments User Context   07/22/2015 11:25 AM 07/25/2015  5:45 PM Full Code [id number]  Samella Parr, NP ED   06/17/2015  5:04 AM 06/22/2015  5:59 PM Full Code [id number]  Ivor Costa, MD ED   06/06/2015  3:10 AM 06/11/2015 10:05 PM Full Code [id number]  Edwin Dada, MD Inpatient   11/25/2014  2:13 AM 11/29/2014  8:07 PM Full Code [id number]  Rise Patience, MD ED   02/28/2013 11:55 AM 03/05/2013  3:08 PM Full Code [id number]  Marijean Heath, NP ED     Family Communication:  Disposition Plan: Unable to determine discharge date -possibly another 2 days  DVT prophylaxis:  SCDs Consultants:   Procedures:     Objective: Filed Weights   09/19/15 0518  Weight: 80.6 kg (177 lb 11.1 oz)    Intake/Output Summary (Last 24 hours) at 09/21/15 1441 Last data filed at 09/21/15 0640  Gross per 24 hour  Intake    360 ml  Output    300 ml  Net     60 ml     Vitals Filed Vitals:   09/20/15 1402 09/20/15 2213 09/21/15 0623 09/21/15 1245  BP: 126/71 111/69 117/90 104/92  Pulse: 133 138 137 133   Temp: 100.3 F (37.9 C) 98.4 F (36.9 C) 99.3 F (37.4 C) 99.2 F (37.3 C)  TempSrc: Oral Oral Oral Oral  Resp: '18 20 20 20  '$ Height:      Weight:      SpO2: 100% 100% 100% 100%    Exam:  General:  Pt is alert, not in acute distress  HEENT: No icterus, No thrush, oral mucosa moist  Cardiovascular: regular rate and rhythm, S1/S2 No murmur  Respiratory: clear to auscultation bilaterally   Abdomen: Soft, +Bowel sounds, abdomen is diffusely tender, non distended, no guarding  MSK: No cyanosis or clubbing- no pedal edema   Data Reviewed: Basic Metabolic Panel:  Recent Labs Lab 10/05/2015 1217  09/19/15 0539 09/19/15 1600 09/20/15 0521 09/21/15 0500 09/21/15 0835  NA 136  < > 139 134* 131* 128* 130*  K 2.9*  < > 2.9* 2.6* 2.4* <2.0* 2.0*  CL 104  < > 108 104 102 100* 101  CO2 21*  < > 21* 20* 19* 20* 20*  GLUCOSE 158*  < > 206* 229* 237* 179* 196*  BUN 10  < > '8 9 7 7 7  '$ CREATININE 0.48  < > 0.42* 0.45 0.42* 0.30* 0.44  CALCIUM 9.0  < > 8.5* 7.9* 8.0* 7.4* 7.5*  MG 1.4*  --  1.3* 1.6* 1.5*  --   --   PHOS 4.4  --  3.0  --   --   --   --   < > = values in this interval not displayed. Liver Function Tests:  Recent Labs Lab 10/09/2015 1217  AST 16  ALT 10*  ALKPHOS 82  BILITOT 0.7  PROT 6.1*  ALBUMIN 3.3*   No results for input(s): LIPASE, AMYLASE in the last 168 hours. No results for input(s): AMMONIA in the last 168 hours. CBC:  Recent Labs Lab 09/27/2015 1217  09/18/15 0320 09/19/15 0539 09/20/15 0521 09/21/15 0500 09/21/15 0835  WBC 8.2  < > 0.4* 0.1* 0.1* <0.1* <0.1*  NEUTROABS 7.6  --   --  0.0*  --   --   --   HGB 9.8*  < > 8.8* 8.4* 8.5* 7.6* 7.4*  HCT 27.4*  < > 24.8* 24.0* 23.9* 20.8* 20.2*  MCV 86.4  < > 88.9 87.9 86.0 83.9 83.8  PLT 185  < > 113* 58* 29* 6* <5*  < > = values in this interval not displayed. Cardiac Enzymes: No results for input(s): CKTOTAL, CKMB, CKMBINDEX, TROPONINI in the last 168 hours. BNP (last 3 results)  Recent  Labs  06/05/15 2050 08/09/15 0113  BNP 20.5 25.1    ProBNP (last 3 results) No results for input(s): PROBNP in the last 8760 hours.  CBG:  Recent Labs Lab 09/20/15 1150 09/20/15  1711 09/20/15 2218 09/21/15 0811 09/21/15 1125  GLUCAP 189* 263* 151* 172* 162*    Recent Results (from the past 240 hour(s))  Culture, blood (routine x 2)     Status: None   Collection Time: 09/15/2015 12:17 PM  Result Value Ref Range Status   Specimen Description PORTA CATH  Final   Special Requests BOTTLES DRAWN AEROBIC AND ANAEROBIC 5CC  Final   Culture   Final    NO GROWTH 5 DAYS Performed at [facility]    Report Status 09/21/2015 FINAL  Final  Culture, blood (routine x 2)     Status: None   Collection Time: 10/04/2015 12:18 PM  Result Value Ref Range Status   Specimen Description RIGHT ANTECUBITAL  Final   Special Requests BOTTLES DRAWN AEROBIC AND ANAEROBIC 5CC  Final   Culture   Final    NO GROWTH 5 DAYS Performed at [facility]    Report Status 09/21/2015 FINAL  Final  Urine culture     Status: None   Collection Time: 09/30/2015  2:08 PM  Result Value Ref Range Status   Specimen Description URINE, CLEAN CATCH  Final   Special Requests NONE  Final   Culture   Final    NO GROWTH 1 DAY Performed at [facility]    Report Status 09/17/2015 FINAL  Final  Culture, blood (Routine X 2) w Reflex to ID Panel     Status: None (Preliminary result)   Collection Time: 09/20/15  4:09 PM  Result Value Ref Range Status   Specimen Description BLOOD RIGHT ARM  Final   Special Requests IN PEDIATRIC BOTTLE 0.5CC  Final   Culture   Final    NO GROWTH < 24 HOURS Performed at [facility]    Report Status PENDING  Incomplete  C difficile quick scan w PCR reflex     Status: None   Collection Time: 09/20/15  6:55 PM  Result Value Ref Range Status   C Diff antigen NEGATIVE NEGATIVE Final   C Diff toxin NEGATIVE NEGATIVE Final   C Diff interpretation Negative for  toxigenic C. difficile  Final  Gastrointestinal Panel by PCR , Stool     Status: None   Collection Time: 09/20/15  6:55 PM  Result Value Ref Range Status   Campylobacter species NOT DETECTED NOT DETECTED Final   Plesimonas shigelloides NOT DETECTED NOT DETECTED Final   Salmonella species NOT DETECTED NOT DETECTED Final   Yersinia enterocolitica NOT DETECTED NOT DETECTED Final   Vibrio species NOT DETECTED NOT DETECTED Final   Vibrio cholerae NOT DETECTED NOT DETECTED Final   Enteroaggregative E coli (EAEC) NOT DETECTED NOT DETECTED Final   Enteropathogenic E coli (EPEC) NOT DETECTED NOT DETECTED Final   Enterotoxigenic E coli (ETEC) NOT DETECTED NOT DETECTED Final   Shiga like toxin producing E coli (STEC) NOT DETECTED NOT DETECTED Final   E. coli O157 NOT DETECTED NOT DETECTED Final   Shigella/Enteroinvasive E coli (EIEC) NOT DETECTED NOT DETECTED Final   Cryptosporidium NOT DETECTED NOT DETECTED Final   Cyclospora cayetanensis NOT DETECTED NOT DETECTED Final   Entamoeba histolytica NOT DETECTED NOT DETECTED Final   Giardia lamblia NOT DETECTED NOT DETECTED Final   Adenovirus F40/41 NOT DETECTED NOT DETECTED Final   Astrovirus NOT DETECTED NOT DETECTED Final   Norovirus GI/GII NOT DETECTED NOT DETECTED Final   Rotavirus A NOT DETECTED NOT DETECTED Final   Sapovirus (I, II, IV, and V) NOT DETECTED NOT  DETECTED Final  Culture, blood (single)     Status: None (Preliminary result)   Collection Time: 09/20/15  6:55 PM  Result Value Ref Range Status   Specimen Description BLOOD RIGHT ARM  Final   Special Requests BOTTLES DRAWN AEROBIC AND ANAEROBIC  10CC  Final   Culture   Final    NO GROWTH < 24 HOURS Performed at [facility]    Report Status PENDING  Incomplete     Studies: No results found.  Scheduled Meds:  Scheduled Meds: . sodium chloride   Intravenous Once  . antiseptic oral rinse  7 mL Mouth Rinse BID  . budesonide-formoterol  2 puff Inhalation BID  .  calcium-vitamin D  1 tablet Oral BID  . ceFEPime (MAXIPIME) IV  2 g Intravenous Q8H  . cholecalciferol  1,000 Units Oral Daily  . gabapentin  600 mg Oral TID  . insulin aspart  0-9 Units Subcutaneous TID WC  . insulin glargine  5 Units Subcutaneous QHS  . lamoTRIgine  125 mg Oral BID  . magnesium oxide  800 mg Oral TID  . niacin  750 mg Oral QHS  . ondansetron (ZOFRAN) IV  4 mg Intravenous Q6H  . QUEtiapine  800 mg Oral QHS  . simvastatin  40 mg Oral q1800  . sodium chloride flush  10-40 mL Intracatheter Q12H  . sucralfate  1 g Oral TID WC & HS  . tiotropium  18 mcg Inhalation Daily  . vancomycin  1,000 mg Intravenous Q8H   Continuous Infusions: . 0.9 % NaCl with KCl 20 mEq / L 100 mL/hr at 09/21/15 1322    Time spent on care of this patient: 62 min   [place], MD 09/21/2015, 2:41 PM  LOS: 2 days   [facility] Office  [phone] Pager - Text Page per [email or web address] If 7PM-7AM, please contact night-coverage [email or web address]

## 2015-09-21 NOTE — Progress Notes (Signed)
CRITICAL VALUE ALERT  Critical value received:  K+ 2.0  Date of notification:  09-21-15  Time of notification:  10:10 a.m.   Critical value read back:yes   Nurse who received alert:  Shelton Silvas, RN  MD notified (1st page):  Dr. Wynelle Cleveland (on unit)   Time of first page:  10:15 a.m.   MD notified (2nd page):  Time of second page:  Responding MD:  Wynelle Cleveland   Time MD responded:  10:15 a.m.

## 2015-09-21 NOTE — Progress Notes (Signed)
Critical lab value of potassium <2, on call provider notified. No new orders at this time. Will continue to monitor.

## 2015-09-21 NOTE — Progress Notes (Signed)
PT Cancellation Note / Sign off  Patient Details Name: Sarah Leonard MRN: [id number] DOB: [birth year]   Cancelled Treatment:    Reason Eval/Treat Not Completed: Medical issues which prohibited therapy (multiple critical values this AM)  This is 4th cancel due to medical reasons.  PT to sign off at this point.  Please re-order when pt is medically ready.   [name],KATHrine E 09/21/2015, 8:49 AM Carmelia Bake, PT, DPT 09/21/2015 Pager: [phone]

## 2015-09-21 NOTE — Progress Notes (Signed)
Pt refused all PO meds this evening with the exception of percocet. Randilyn Foisy, Bing Neighbors, RN

## 2015-09-21 NOTE — Progress Notes (Signed)
Inpatient Diabetes Program Recommendations  AACE/ADA: New Consensus Statement on Inpatient Glycemic Control (2015)  Target Ranges:  Prepandial:   less than 140 mg/dL      Peak postprandial:   less than 180 mg/dL (1-2 hours)      Critically ill patients:  140 - 180 mg/dL   Results for TULIP, MEHARG (MRN [id number]) as of 09/21/2015 09:45  Ref. Range 09/20/2015 07:57 09/20/2015 11:50 09/20/2015 17:11 09/20/2015 22:18  Glucose-Capillary Latest Ref Range: 65-99 mg/dL 197 (H) 189 (H) 263 (H) 151 (H)    Admit with: Nausea & Vomiting/ Lung Cancer  History: DM, Pancreatitis, ETOH, COPD  Home DM Meds: Lantus 10 units QHS  Novolog 5 units tidwc  Current Insulin Orders: Lantus 5 units QHS  Novolog Sensitive SSI (0-9 units) TID AC      MD- Fasting glucose levels elevated.   Please consider increasing Lantus to 10 units QHS (home dose)    --Will follow patient during hospitalization--  Wyn Quaker RN, MSN, CDE Diabetes Coordinator Inpatient Glycemic Control Team Team Pager: [phone] (8a-5p)

## 2015-09-22 LAB — CBC
HCT: 24.7 % — ABNORMAL LOW (ref 36.0–46.0)
Hemoglobin: 8.9 g/dL — ABNORMAL LOW (ref 12.0–15.0)
MCH: 29.8 pg (ref 26.0–34.0)
MCHC: 36 g/dL (ref 30.0–36.0)
MCV: 82.6 fL (ref 78.0–100.0)
Platelets: 16 10*3/uL — CL (ref 150–400)
RBC: 2.99 MIL/uL — ABNORMAL LOW (ref 3.87–5.11)
RDW: 16.1 % — ABNORMAL HIGH (ref 11.5–15.5)
WBC: 0.1 10*3/uL — CL (ref 4.0–10.5)

## 2015-09-22 LAB — GLUCOSE, CAPILLARY
Glucose-Capillary: 171 mg/dL — ABNORMAL HIGH (ref 65–99)
Glucose-Capillary: 167 mg/dL — ABNORMAL HIGH (ref 65–99)
Glucose-Capillary: 161 mg/dL — ABNORMAL HIGH (ref 65–99)

## 2015-09-22 LAB — BASIC METABOLIC PANEL
Anion gap: 10 (ref 5–15)
Anion gap: 9 (ref 5–15)
BUN: 7 mg/dL (ref 6–20)
BUN: 6 mg/dL (ref 6–20)
CALCIUM: 7.7 mg/dL — AB (ref 8.9–10.3)
CHLORIDE: 101 mmol/L (ref 101–111)
CO2: 20 mmol/L — AB (ref 22–32)
CO2: 20 mmol/L — AB (ref 22–32)
CREATININE: 0.36 mg/dL — AB (ref 0.44–1.00)
Calcium: 7.5 mg/dL — ABNORMAL LOW (ref 8.9–10.3)
Chloride: 100 mmol/L — ABNORMAL LOW (ref 101–111)
Creatinine, Ser: 0.41 mg/dL — ABNORMAL LOW (ref 0.44–1.00)
GFR calc Af Amer: >60 mL/min (ref >60)
GFR calc non Af Amer: >60 mL/min (ref >60)
GLUCOSE: 186 mg/dL — AB (ref 65–99)
Glucose, Bld: 174 mg/dL — ABNORMAL HIGH (ref 65–99)
Potassium: <2 mmol/L — CL (ref 3.5–5.1)
SODIUM: 130 mmol/L — AB (ref 135–145)
Sodium: 130 mmol/L — ABNORMAL LOW (ref 135–145)

## 2015-09-22 LAB — VANCOMYCIN, TROUGH: Vancomycin Tr: 8 ug/mL — ABNORMAL LOW (ref 10.0–20.0)

## 2015-09-22 LAB — MAGNESIUM
Magnesium: 1.5 mg/dL — ABNORMAL LOW (ref 1.7–2.4)
Magnesium: 1.9 mg/dL (ref 1.7–2.4)

## 2015-09-22 MED ORDER — BLISTEX MEDICATED EX OINT: TOPICAL_OINTMENT | CUTANEOUS | Status: DC | PRN

## 2015-09-22 MED ORDER — POTASSIUM CHLORIDE 10 MEQ/100ML IV SOLN
10.0000 meq | INTRAVENOUS | Status: AC
  Administered 2015-09-22 (×6): 10 meq via INTRAVENOUS
  Filled 2015-09-22 (×8): qty 100

## 2015-09-22 MED ORDER — DIPHENOXYLATE-ATROPINE 2.5-0.025 MG PO TABS
1.0000 | ORAL_TABLET | Freq: Four times a day (QID) | ORAL | Status: DC
  Administered 2015-09-22 (×3): 1 via ORAL
  Filled 2015-09-22 (×3): qty 1

## 2015-09-22 MED ORDER — POTASSIUM CHLORIDE 10 MEQ/100ML IV SOLN
10.0000 meq | INTRAVENOUS | Status: AC
  Administered 2015-09-22 – 2015-09-23 (×6): 10 meq via INTRAVENOUS
  Filled 2015-09-22 (×7): qty 100

## 2015-09-22 MED ORDER — MAGNESIUM SULFATE 4 GM/100ML IV SOLN
4.0000 g | Freq: Once | INTRAVENOUS | Status: AC
  Administered 2015-09-22: 4 g via INTRAVENOUS
  Filled 2015-09-22: qty 100

## 2015-09-22 MED ORDER — LIP MEDEX EX OINT
TOPICAL_OINTMENT | CUTANEOUS | Status: DC | PRN
  Administered 2015-09-24: 22:00:00 via TOPICAL
  Filled 2015-09-22: qty 7

## 2015-09-22 MED ORDER — SODIUM CHLORIDE 0.9 % IV BOLUS (SEPSIS)
250.0000 mL | Freq: Once | INTRAVENOUS | Status: AC
  Administered 2015-09-22: 250 mL via INTRAVENOUS

## 2015-09-22 MED ORDER — VANCOMYCIN HCL 10 G IV SOLR
1250.0000 mg | Freq: Three times a day (TID) | INTRAVENOUS | Status: DC
  Administered 2015-09-22 – 2015-09-24 (×7): 1250 mg via INTRAVENOUS
  Filled 2015-09-22 (×8): qty 1250

## 2015-09-22 NOTE — Progress Notes (Signed)
Pharmacy Antibiotic Note  Sarah Leonard is a 55 y.o. female admitted on 10/06/2015 with sepsis.  Pharmacy has been consulted for vancomycin and cefepime dosing.  Pt neutropenic but remains afebrile.  CXR negative.  UA negative.  No issues regarding implanted port.  Plan: Continue Cefepime 2g IV q8h. Adjust Vancomycin to 1250 mg IV q8h for low VT, although increasing only slightly given missed dose yesterday evening.  Would consider discontinuing Vancomycin since patient has been afebrile and cultures have been negative x 48 hours per febrile neutropenia guidelines.  Height: '5\' 3"'$  (160 cm) Weight: 177 lb 11.1 oz (80.6 kg) IBW/kg (Calculated) : 52.4  Temp (24hrs), Avg:98.8 F (37.1 C), Min:98 F (36.7 C), Max:99.3 F (37.4 C)   Recent Labs Lab 10/07/2015 1225 09/30/2015 1453  09/19/15 0539 09/19/15 1600 09/20/15 0521 09/21/15 0500 09/21/15 0835 09/22/15 0300 09/22/15 0939  WBC  --   --   < > 0.1*  --  0.1* <0.1* <0.1* 0.1*  --   CREATININE  --   --   < > 0.42* 0.45 0.42* 0.30* 0.44 0.41*  --   LATICACIDVEN 1.39 1.05  --   --   --   --   --   --   --   --   VANCOTROUGH  --   --   --   --   --   --   --   --   --  8*  < > = values in this interval not displayed.  Estimated Creatinine Clearance: 79.9 mL/min (by C-G formula based on Cr of 0.41).    No Known Allergies  Antimicrobials this admission: 2/9 vanc>> 2/9 cefepime>>  Levels/dose changes this admission: 2/11 0800 VT = 8 mcg/ml on 1gm q8h (was supposed to be prior to 6th dose but appears that patient missed her 4th dose due to blood transfusion occuring at that time and dose was never given. This vanc trough reflects a level after only 2 maintenance doses)  Microbiology results: 2/5 bcx x2: NGF 2/5 ucx: neg FINAL 2/9 bcx x2: ngtd x 2 days 2/9 C. Diff: -/- 2/9 GI panel: negative  Thank you for allowing pharmacy to be a part of this patient's care.  Hershal Coria 09/22/2015 11:15 AM

## 2015-09-22 NOTE — Progress Notes (Signed)
Patient received from previous nurse. Report given and patient in stable condition. I agree with the previous nurse's assessment. Will continue to monitor.

## 2015-09-22 NOTE — Progress Notes (Signed)
CRITICAL VALUE ALERT  Critical value received:  Potassium <2.0  Date of notification:  09/22/15  Time of notification:  0514  Critical value read back:yes  Nurse who received alert:  Thedora Hinders  MD notified (1st page):  Rogue Bussing  Time of first page:  0517  MD notified (2nd page):  Time of second page:  Responding MD:  Rogue Bussing  Time MD responded:  [phone]

## 2015-09-22 NOTE — Progress Notes (Signed)
[facility] Progress Note   [name]  RWE:[id number]  DOB: 16-Jan-1961  DOA: 09/22/2015 PCP: Moreen Fowler  Brief narrative: Sarah Leonard is a 55 y.o. female presenting from [facility] with a recent diagnosis of small cell lung cancer and history of COPD, tobacco abuse depression, diabetes mellitus, hyperlipidemia, bipolar disorder, alcohol and cocaine abuse in the past who presents to the hospital with vomiting and diarrhea occurring post-chemotherapy. There is a note from the day of admission that also states that she also had a temperature 101.7 but there have been no fevers noted since she has been admitted to the hospital.  She is undergoing chemotherapy which is being given by Dr. Julien Nordmann. She is also receiving radiotherapy under the care of Dr. Tammi Klippel.   Subjective: Still having watery diarrhea today. Still tolerating clear liquid diet. No cough, sore throat, sinus drainage, dysuria.   Assessment/Plan: Principal Problem:   Nausea & vomiting/mild diarrhea -As a result of chemotherapy -continue Zofran to every 6 hours routine rather than when necessary and added IV Phenergan -Continue IV fluids and pain control - make Lomotil QID routine to help control diarrhea.  Active Problems:  Neutropenic fever - Started on 2/9-  blood cultures NTD - UA and c diff-negative - GI pathogen panel also negative - Started Cefepime and Vancomycin - fevers have resolved for now- cont to follow closely  Severe Hypokalemia/hypomagnesemia - not improving despite large amount of electrolyte replacement- likely due to ongoing GI losses - continuing to replace agressively  - Unable to take oral replacement due to vomiting - continue IV potassium  and magnesium replacement - recheck Bmet this evening    Antineoplastic chemotherapy induced pancytopenia  -Given Neulasta after chemo and then received Granix x 2 (last one  2/9) -Holding heparin and Lovenox  due to drop in platelets   - transfused 1 U PRBC and 1 U platelets on 2/10  Small cell lung cancer- (T1a, N2, M0) -Under the care of Dr. Julien Nordmann and Dr. Tammi Klippel -Received 3 cycles of chemotherapy- last cycle was 1/31    COPD (chronic obstructive pulmonary disease)  - Appears stable at this time - Is receiving Spiriva and nebulizer treatments    Hyperlipidemia -Is receiving Zocor and niacin    GERD (gastroesophageal reflux disease) -Is receiving sucralfate  Diabetes mellitus -We can continue low-dose Lantus and sliding scale insulin    Antibiotics: Anti-infectives    Start     Dose/Rate Route Frequency Ordered Stop   09/22/15 1400  vancomycin (VANCOCIN) 1,250 mg in sodium chloride 0.9 % 250 mL IVPB     1,250 mg 166.7 mL/hr over 90 Minutes Intravenous Every 8 hours 09/22/15 1135     09/20/15 1630  vancomycin (VANCOCIN) IVPB 1000 mg/200 mL premix  Status:  Discontinued     1,000 mg 200 mL/hr over 60 Minutes Intravenous Every 8 hours 09/20/15 1521 09/22/15 1135   09/20/15 1600  ceFEPIme (MAXIPIME) 2 g in dextrose 5 % 50 mL IVPB     2 g 100 mL/hr over 30 Minutes Intravenous Every 8 hours 09/20/15 1521       Code Status:     Code Status Orders        Start     Ordered   09/21/2015 1746  Full code   Continuous     09/17/2015 1745    Code Status History    Date Active Date Inactive Code Status Order ID Comments User Context   07/22/2015  11:25 AM 07/25/2015  5:45 PM Full Code [id number]  Samella Parr, NP ED   06/17/2015  5:04 AM 06/22/2015  5:59 PM Full Code [id number]  Ivor Costa, MD ED   06/06/2015  3:10 AM 06/11/2015 10:05 PM Full Code [id number]  Edwin Dada, MD Inpatient   11/25/2014  2:13 AM 11/29/2014  8:07 PM Full Code [id number]  Rise Patience, MD ED   02/28/2013 11:55 AM 03/05/2013  3:08 PM Full Code [id number]  Marijean Heath, NP ED     Family Communication:  Disposition Plan: Unable to determine discharge date -possibly another 2 days  DVT  prophylaxis:  SCDs Consultants:   Procedures:     Objective: Filed Weights   09/19/15 0518  Weight: 80.6 kg (177 lb 11.1 oz)    Intake/Output Summary (Last 24 hours) at 09/22/15 1452 Last data filed at 09/22/15 1300  Gross per 24 hour  Intake 2376.67 ml  Output    500 ml  Net 1876.67 ml     Vitals Filed Vitals:   09/22/15 0520 09/22/15 0622 09/22/15 0955 09/22/15 1343  BP: 151/93   128/67  Pulse: 139 125  138  Temp: 98.1 F (36.7 C)   99.7 F (37.6 C)  TempSrc: Oral   Oral  Resp: 20   20  Height:      Weight:      SpO2: 100%  99% 98%    Exam:  General:  Pt is alert, not in acute distress  HEENT: No icterus, No thrush, oral mucosa moist  Cardiovascular: regular rate and rhythm, S1/S2 No murmur  Respiratory: clear to auscultation bilaterally   Abdomen: Soft, +Bowel sounds, abdomen is diffusely tender, non distended, no guarding  MSK: No cyanosis or clubbing- no pedal edema   Data Reviewed: Basic Metabolic Panel:  Recent Labs Lab 10/07/2015 1217  09/19/15 0539 09/19/15 1600 09/20/15 0521 09/21/15 0500 09/21/15 0835 09/22/15 0300 09/22/15 0340  NA 136  < > 139 134* 131* 128* 130* 130*  --   K 2.9*  < > 2.9* 2.6* 2.4* <2.0* 2.0* <2.0*  --   CL 104  < > 108 104 102 100* 101 100*  --   CO2 21*  < > 21* 20* 19* 20* 20* 20*  --   GLUCOSE 158*  < > 206* 229* 237* 179* 196* 186*  --   BUN 10  < > '8 9 7 7 7 7  '$ --   CREATININE 0.48  < > 0.42* 0.45 0.42* 0.30* 0.44 0.41*  --   CALCIUM 9.0  < > 8.5* 7.9* 8.0* 7.4* 7.5* 7.7*  --   MG 1.4*  --  1.3* 1.6* 1.5*  --   --   --  1.5*  PHOS 4.4  --  3.0  --   --   --   --   --   --   < > = values in this interval not displayed. Liver Function Tests:  Recent Labs Lab 09/19/2015 1217  AST 16  ALT 10*  ALKPHOS 82  BILITOT 0.7  PROT 6.1*  ALBUMIN 3.3*   No results for input(s): LIPASE, AMYLASE in the last 168 hours. No results for input(s): AMMONIA in the last 168 hours. CBC:  Recent Labs Lab 09/12/2015 1217   09/19/15 0539 09/20/15 0521 09/21/15 0500 09/21/15 0835 09/22/15 0300  WBC 8.2  < > 0.1* 0.1* <0.1* <0.1* 0.1*  NEUTROABS 7.6  --  0.0*  --   --   --   --  HGB 9.8*  < > 8.4* 8.5* 7.6* 7.4* 8.9*  HCT 27.4*  < > 24.0* 23.9* 20.8* 20.2* 24.7*  MCV 86.4  < > 87.9 86.0 83.9 83.8 82.6  PLT 185  < > 58* 29* 6* <5* 16*  < > = values in this interval not displayed. Cardiac Enzymes: No results for input(s): CKTOTAL, CKMB, CKMBINDEX, TROPONINI in the last 168 hours. BNP (last 3 results)  Recent Labs  06/05/15 2050 08/09/15 0113  BNP 20.5 25.1    ProBNP (last 3 results) No results for input(s): PROBNP in the last 8760 hours.  CBG:  Recent Labs Lab 09/21/15 1125 09/21/15 1622 09/21/15 2120 09/22/15 0733 09/22/15 1147  GLUCAP 162* 145* 164* 171* 167*    Recent Results (from the past 240 hour(s))  Culture, blood (routine x 2)     Status: None   Collection Time: 09/23/2015 12:17 PM  Result Value Ref Range Status   Specimen Description PORTA CATH  Final   Special Requests BOTTLES DRAWN AEROBIC AND ANAEROBIC 5CC  Final   Culture   Final    NO GROWTH 5 DAYS Performed at [facility]    Report Status 09/21/2015 FINAL  Final  Culture, blood (routine x 2)     Status: None   Collection Time: 09/18/2015 12:18 PM  Result Value Ref Range Status   Specimen Description RIGHT ANTECUBITAL  Final   Special Requests BOTTLES DRAWN AEROBIC AND ANAEROBIC 5CC  Final   Culture   Final    NO GROWTH 5 DAYS Performed at [facility]    Report Status 09/21/2015 FINAL  Final  Urine culture     Status: None   Collection Time: 10/09/2015  2:08 PM  Result Value Ref Range Status   Specimen Description URINE, CLEAN CATCH  Final   Special Requests NONE  Final   Culture   Final    NO GROWTH 1 DAY Performed at [facility]    Report Status 09/17/2015 FINAL  Final  Culture, blood (Routine X 2) w Reflex to ID Panel     Status: None (Preliminary result)   Collection Time: 09/20/15   4:09 PM  Result Value Ref Range Status   Specimen Description BLOOD RIGHT ARM  Final   Special Requests IN PEDIATRIC BOTTLE 0.5CC  Final   Culture   Final    NO GROWTH 2 DAYS Performed at [facility]    Report Status PENDING  Incomplete  C difficile quick scan w PCR reflex     Status: None   Collection Time: 09/20/15  6:55 PM  Result Value Ref Range Status   C Diff antigen NEGATIVE NEGATIVE Final   C Diff toxin NEGATIVE NEGATIVE Final   C Diff interpretation Negative for toxigenic C. difficile  Final  Gastrointestinal Panel by PCR , Stool     Status: None   Collection Time: 09/20/15  6:55 PM  Result Value Ref Range Status   Campylobacter species NOT DETECTED NOT DETECTED Final   Plesimonas shigelloides NOT DETECTED NOT DETECTED Final   Salmonella species NOT DETECTED NOT DETECTED Final   Yersinia enterocolitica NOT DETECTED NOT DETECTED Final   Vibrio species NOT DETECTED NOT DETECTED Final   Vibrio cholerae NOT DETECTED NOT DETECTED Final   Enteroaggregative E coli (EAEC) NOT DETECTED NOT DETECTED Final   Enteropathogenic E coli (EPEC) NOT DETECTED NOT DETECTED Final   Enterotoxigenic E coli (ETEC) NOT DETECTED NOT DETECTED Final   Shiga like toxin producing E  coli (STEC) NOT DETECTED NOT DETECTED Final   E. coli O157 NOT DETECTED NOT DETECTED Final   Shigella/Enteroinvasive E coli (EIEC) NOT DETECTED NOT DETECTED Final   Cryptosporidium NOT DETECTED NOT DETECTED Final   Cyclospora cayetanensis NOT DETECTED NOT DETECTED Final   Entamoeba histolytica NOT DETECTED NOT DETECTED Final   Giardia lamblia NOT DETECTED NOT DETECTED Final   Adenovirus F40/41 NOT DETECTED NOT DETECTED Final   Astrovirus NOT DETECTED NOT DETECTED Final   Norovirus GI/GII NOT DETECTED NOT DETECTED Final   Rotavirus A NOT DETECTED NOT DETECTED Final   Sapovirus (I, II, IV, and V) NOT DETECTED NOT DETECTED Final  Culture, blood (single)     Status: None (Preliminary result)   Collection Time:  09/20/15  6:55 PM  Result Value Ref Range Status   Specimen Description BLOOD RIGHT ARM  Final   Special Requests BOTTLES DRAWN AEROBIC AND ANAEROBIC  10CC  Final   Culture   Final    NO GROWTH 2 DAYS Performed at [facility]    Report Status PENDING  Incomplete     Studies: No results found.  Scheduled Meds:  Scheduled Meds: . antiseptic oral rinse  7 mL Mouth Rinse BID  . budesonide-formoterol  2 puff Inhalation BID  . calcium-vitamin D  1 tablet Oral BID  . ceFEPime (MAXIPIME) IV  2 g Intravenous Q8H  . cholecalciferol  1,000 Units Oral Daily  . diphenoxylate-atropine  1 tablet Oral QID  . gabapentin  600 mg Oral TID  . insulin aspart  0-9 Units Subcutaneous TID WC  . insulin glargine  5 Units Subcutaneous QHS  . lamoTRIgine  125 mg Oral BID  . niacin  750 mg Oral QHS  . ondansetron (ZOFRAN) IV  4 mg Intravenous Q6H  . QUEtiapine  800 mg Oral QHS  . simvastatin  40 mg Oral q1800  . sodium chloride flush  10-40 mL Intracatheter Q12H  . sucralfate  1 g Oral TID WC & HS  . tiotropium  18 mcg Inhalation Daily  . vancomycin  1,250 mg Intravenous Q8H   Continuous Infusions: . 0.9 % NaCl with KCl 20 mEq / L 100 mL/hr at 09/21/15 2117    Time spent on care of this patient: 35 min   [place], MD 09/22/2015, 2:52 PM  LOS: 3 days   [facility] Office  [phone] Pager - Text Page per [email or web address] If 7PM-7AM, please contact night-coverage [email or web address]

## 2015-09-23 ENCOUNTER — Encounter (HOSPITAL_COMMUNITY): Payer: Self-pay | Admitting: Radiology

## 2015-09-23 ENCOUNTER — Inpatient Hospital Stay (HOSPITAL_COMMUNITY): Payer: Medicaid Other

## 2015-09-23 LAB — GLUCOSE, CAPILLARY
GLUCOSE-CAPILLARY: 129 mg/dL — AB (ref 65–99)
GLUCOSE-CAPILLARY: 133 mg/dL — AB (ref 65–99)
GLUCOSE-CAPILLARY: 138 mg/dL — AB (ref 65–99)
Glucose-Capillary: 144 mg/dL — ABNORMAL HIGH (ref 65–99)

## 2015-09-23 LAB — PREPARE PLATELET PHERESIS: Unit division: 0

## 2015-09-23 LAB — BASIC METABOLIC PANEL
ANION GAP: 9 (ref 5–15)
BUN: 5 mg/dL — AB (ref 6–20)
CHLORIDE: 106 mmol/L (ref 101–111)
CO2: 18 mmol/L — ABNORMAL LOW (ref 22–32)
Calcium: 8 mg/dL — ABNORMAL LOW (ref 8.9–10.3)
Creatinine, Ser: 0.47 mg/dL (ref 0.44–1.00)
Glucose, Bld: 147 mg/dL — ABNORMAL HIGH (ref 65–99)
POTASSIUM: 2.4 mmol/L — AB (ref 3.5–5.1)
SODIUM: 133 mmol/L — AB (ref 135–145)

## 2015-09-23 LAB — TYPE AND SCREEN
ABO/RH(D): O POS
Antibody Screen: NEGATIVE
UNIT DIVISION: 0

## 2015-09-23 LAB — CBC
HCT: 21.4 % — ABNORMAL LOW (ref 36.0–46.0)
Hemoglobin: 7.9 g/dL — ABNORMAL LOW (ref 12.0–15.0)
MCH: 29.6 pg (ref 26.0–34.0)
MCHC: 36.9 g/dL — AB (ref 30.0–36.0)
MCV: 80.1 fL (ref 78.0–100.0)
RBC: 2.67 MIL/uL — ABNORMAL LOW (ref 3.87–5.11)
RDW: 16.8 % — AB (ref 11.5–15.5)

## 2015-09-23 LAB — LACTIC ACID, PLASMA: LACTIC ACID, VENOUS: 2.5 mmol/L — AB (ref 0.5–2.0)

## 2015-09-23 MED ORDER — METOPROLOL TARTRATE 1 MG/ML IV SOLN
INTRAVENOUS | Status: AC
  Filled 2015-09-23: qty 5

## 2015-09-23 MED ORDER — DIPHENOXYLATE-ATROPINE 2.5-0.025 MG PO TABS
2.0000 | ORAL_TABLET | Freq: Four times a day (QID) | ORAL | Status: DC
  Administered 2015-09-23 (×4): 2 via ORAL
  Filled 2015-09-23 (×4): qty 2

## 2015-09-23 MED ORDER — IOHEXOL 300 MG/ML  SOLN
100.0000 mL | Freq: Once | INTRAMUSCULAR | Status: AC | PRN
  Administered 2015-09-23: 100 mL via INTRAVENOUS

## 2015-09-23 MED ORDER — SODIUM CHLORIDE 0.9 % IV SOLN
Freq: Once | INTRAVENOUS | Status: AC
  Administered 2015-09-23: 18:00:00 via INTRAVENOUS

## 2015-09-23 MED ORDER — PANTOPRAZOLE SODIUM 40 MG IV SOLR
40.0000 mg | Freq: Two times a day (BID) | INTRAVENOUS | Status: DC
  Administered 2015-09-23 – 2015-09-24 (×4): 40 mg via INTRAVENOUS
  Filled 2015-09-23 (×5): qty 40

## 2015-09-23 MED ORDER — TBO-FILGRASTIM 480 MCG/0.8ML ~~LOC~~ SOSY
480.0000 ug | PREFILLED_SYRINGE | Freq: Once | SUBCUTANEOUS | Status: AC
  Administered 2015-09-23: 480 ug via SUBCUTANEOUS
  Filled 2015-09-23: qty 0.8

## 2015-09-23 MED ORDER — SODIUM CHLORIDE 0.9 % IV BOLUS (SEPSIS)
500.0000 mL | Freq: Once | INTRAVENOUS | Status: AC
  Administered 2015-09-23: 500 mL via INTRAVENOUS

## 2015-09-23 MED ORDER — IOHEXOL 300 MG/ML  SOLN
25.0000 mL | Freq: Once | INTRAMUSCULAR | Status: AC | PRN
  Administered 2015-09-23: 25 mL via ORAL

## 2015-09-23 MED ORDER — POTASSIUM CHLORIDE 10 MEQ/100ML IV SOLN
10.0000 meq | INTRAVENOUS | Status: AC
  Administered 2015-09-23 (×6): 10 meq via INTRAVENOUS
  Filled 2015-09-23 (×4): qty 100

## 2015-09-23 MED ORDER — POTASSIUM CHLORIDE 10 MEQ/100ML IV SOLN
10.0000 meq | INTRAVENOUS | Status: AC
  Administered 2015-09-23 – 2015-09-24 (×6): 10 meq via INTRAVENOUS
  Filled 2015-09-23 (×4): qty 100

## 2015-09-23 MED ORDER — METOPROLOL TARTRATE 1 MG/ML IV SOLN
2.5000 mg | Freq: Once | INTRAVENOUS | Status: AC
Start: 2015-09-23 — End: 2015-09-23
  Administered 2015-09-23: 2.5 mg via INTRAVENOUS

## 2015-09-23 NOTE — Progress Notes (Signed)
CRITICAL VALUE ALERT  Critical value received:  Lactice Acid 2.5  Date of notification:  09/23/2015  Time of notification:  2019  Critical value read back:Yes.    Nurse who received alert:  mk  MD notified (1st page):  Fredirick Maudlin, NP  Time of first page:  2020  MD notified (2nd page):  Time of second page:  Responding MD:  Fredirick Maudlin, NP  Time MD responded:  2025

## 2015-09-23 NOTE — Progress Notes (Signed)
[facility] Progress Note   Sarah Leonard  ACZ:[id number]  DOB: Sep 15, 1960  DOA: 10/02/2015 PCP: Moreen Fowler  Brief narrative: Sarah Leonard is a 55 y.o. female presenting from [facility] with a recent diagnosis of small cell lung cancer and history of COPD, tobacco abuse depression, diabetes mellitus, hyperlipidemia, bipolar disorder, alcohol and cocaine abuse in the past who presents to the hospital with vomiting and diarrhea occurring post-chemotherapy. There is a note from the day of admission that also states that she also had a temperature 101.7 but there have been no fevers noted since she has been admitted to the hospital.  She is undergoing chemotherapy which is being given by Dr. Julien Nordmann. She is also receiving radiotherapy under the care of Dr. Tammi Klippel.   Subjective: Still having watery diarrhea today. Still tolerating clear liquid diet. No cough, sore throat, sinus drainage, dysuria.   Assessment/Plan: Principal Problem:   Nausea & vomiting/mild diarrhea -As a result of chemotherapy -continue Zofran to every 6 hours routine and IV Phenergan -Continue IV fluids and pain control -cont Lomotil - c diff and Gi pathogen panel negative - obtain CT scan as symptoms have not improved   Active Problems:  Neutropenic fever - Started on 2/9-  blood cultures NTD - UA and c diff-negative - GI pathogen panel also negative - Started Cefepime and Vancomycin - fevers have resolved for now- cont to follow closely  Severe Hypokalemia/hypomagnesemia - not improving despite large amount of electrolyte replacement- likely due to ongoing GI losses - continuing to replace agressively  - Unable to take oral replacement due to vomiting - continue IV potassium  and magnesium replacement - recheck Bmet this evening    Antineoplastic chemotherapy induced pancytopenia  -Given Neulasta after chemo and then received Granix x 2 (last one  2/9) -Holding  heparin and Lovenox due to thrombocytopenia  - transfused 1 U PRBC and 1 U platelets on 2/10 - will need another unit of platelets today as Platelets < 5  Small cell lung cancer- (T1a, N2, M0) -Under the care of Dr. Julien Nordmann and Dr. Tammi Klippel -Received 3 cycles of chemotherapy- last cycle was 1/31    COPD (chronic obstructive pulmonary disease)  - Appears stable at this time - Is receiving Spiriva and nebulizer treatments    Hyperlipidemia -Is receiving Zocor and niacin    GERD (gastroesophageal reflux disease) -Is receiving sucralfate  Diabetes mellitus -We can continue low-dose Lantus and sliding scale insulin    Antibiotics: Anti-infectives    Start     Dose/Rate Route Frequency Ordered Stop   09/22/15 1400  vancomycin (VANCOCIN) 1,250 mg in sodium chloride 0.9 % 250 mL IVPB     1,250 mg 166.7 mL/hr over 90 Minutes Intravenous Every 8 hours 09/22/15 1135     09/20/15 1630  vancomycin (VANCOCIN) IVPB 1000 mg/200 mL premix  Status:  Discontinued     1,000 mg 200 mL/hr over 60 Minutes Intravenous Every 8 hours 09/20/15 1521 09/22/15 1135   09/20/15 1600  ceFEPIme (MAXIPIME) 2 g in dextrose 5 % 50 mL IVPB     2 g 100 mL/hr over 30 Minutes Intravenous Every 8 hours 09/20/15 1521       Code Status:     Code Status Orders        Start     Ordered   10/02/2015 1746  Full code   Continuous     09/15/2015 1745    Code Status History  Date Active Date Inactive Code Status Order ID Comments User Context   07/22/2015 11:25 AM 07/25/2015  5:45 PM Full Code [id number]  Samella Parr, NP ED   06/17/2015  5:04 AM 06/22/2015  5:59 PM Full Code [id number]  Ivor Costa, MD ED   06/06/2015  3:10 AM 06/11/2015 10:05 PM Full Code [id number]  Edwin Dada, MD Inpatient   11/25/2014  2:13 AM 11/29/2014  8:07 PM Full Code [id number]  Rise Patience, MD ED   02/28/2013 11:55 AM 03/05/2013  3:08 PM Full Code [id number]  Marijean Heath, NP ED     Family Communication:  Disposition  Plan: Unable to determine discharge date -possibly another 2 days  DVT prophylaxis:  SCDs Consultants:   Procedures:     Objective: Filed Weights   09/19/15 0518  Weight: 80.6 kg (177 lb 11.1 oz)    Intake/Output Summary (Last 24 hours) at 09/23/15 1401 Last data filed at 09/23/15 1345  Gross per 24 hour  Intake   4500 ml  Output   2527 ml  Net   1973 ml     Vitals Filed Vitals:   09/22/15 1956 09/23/15 0604 09/23/15 0952 09/23/15 1352  BP: 87/65 110/74  94/65  Pulse: 137 131  137  Temp: 98.2 F (36.8 C) 97.9 F (36.6 C)  98.7 F (37.1 C)  TempSrc: Oral Oral  Oral  Resp:    24  Height:      Weight:      SpO2: 100% 99% 96% 100%    Exam:  General:  Pt is alert, not in acute distress  HEENT: No icterus, No thrush, oral mucosa moist  Cardiovascular: regular rate and rhythm, S1/S2 No murmur  Respiratory: clear to auscultation bilaterally   Abdomen: Soft, +Bowel sounds, abdomen is diffusely tender, non distended, no guarding  MSK: No cyanosis or clubbing- no pedal edema   Data Reviewed: Basic Metabolic Panel:  Recent Labs Lab 09/19/15 0539 09/19/15 1600 09/20/15 0521 09/21/15 0500 09/21/15 0835 09/22/15 0300 09/22/15 0340 09/22/15 1800  NA 139 134* 131* 128* 130* 130*  --  130*  K 2.9* 2.6* 2.4* <2.0* 2.0* <2.0*  --  <2.0*  CL 108 104 102 100* 101 100*  --  101  CO2 21* 20* 19* 20* 20* 20*  --  20*  GLUCOSE 206* 229* 237* 179* 196* 186*  --  174*  BUN '8 9 7 7 7 7  '$ --  6  CREATININE 0.42* 0.45 0.42* 0.30* 0.44 0.41*  --  0.36*  CALCIUM 8.5* 7.9* 8.0* 7.4* 7.5* 7.7*  --  7.5*  MG 1.3* 1.6* 1.5*  --   --   --  1.5* 1.9  PHOS 3.0  --   --   --   --   --   --   --    Liver Function Tests: No results for input(s): AST, ALT, ALKPHOS, BILITOT, PROT, ALBUMIN in the last 168 hours. No results for input(s): LIPASE, AMYLASE in the last 168 hours. No results for input(s): AMMONIA in the last 168 hours. CBC:  Recent Labs Lab 09/19/15 0539 09/20/15 0521  09/21/15 0500 09/21/15 0835 09/22/15 0300 09/23/15 0920  WBC 0.1* 0.1* <0.1* <0.1* 0.1* <0.1*  NEUTROABS 0.0*  --   --   --   --   --   HGB 8.4* 8.5* 7.6* 7.4* 8.9* 7.9*  HCT 24.0* 23.9* 20.8* 20.2* 24.7* 21.4*  MCV 87.9 86.0 83.9 83.8 82.6 80.1  PLT 58* 29*  6* <5* 16* <5*   Cardiac Enzymes: No results for input(s): CKTOTAL, CKMB, CKMBINDEX, TROPONINI in the last 168 hours. BNP (last 3 results)  Recent Labs  06/05/15 2050 08/09/15 0113  BNP 20.5 25.1    ProBNP (last 3 results) No results for input(s): PROBNP in the last 8760 hours.  CBG:  Recent Labs Lab 09/22/15 1147 09/22/15 1619 09/22/15 2150 09/23/15 0726 09/23/15 1153  GLUCAP 167* 161* 144* 129* 133*    Recent Results (from the past 240 hour(s))  Culture, blood (routine x 2)     Status: None   Collection Time: 10/08/2015 12:17 PM  Result Value Ref Range Status   Specimen Description PORTA CATH  Final   Special Requests BOTTLES DRAWN AEROBIC AND ANAEROBIC 5CC  Final   Culture   Final    NO GROWTH 5 DAYS Performed at [facility]    Report Status 09/21/2015 FINAL  Final  Culture, blood (routine x 2)     Status: None   Collection Time: 10/09/2015 12:18 PM  Result Value Ref Range Status   Specimen Description RIGHT ANTECUBITAL  Final   Special Requests BOTTLES DRAWN AEROBIC AND ANAEROBIC 5CC  Final   Culture   Final    NO GROWTH 5 DAYS Performed at [facility]    Report Status 09/21/2015 FINAL  Final  Urine culture     Status: None   Collection Time: 09/29/2015  2:08 PM  Result Value Ref Range Status   Specimen Description URINE, CLEAN CATCH  Final   Special Requests NONE  Final   Culture   Final    NO GROWTH 1 DAY Performed at [facility]    Report Status 09/17/2015 FINAL  Final  Culture, blood (Routine X 2) w Reflex to ID Panel     Status: None (Preliminary result)   Collection Time: 09/20/15  4:09 PM  Result Value Ref Range Status   Specimen Description BLOOD RIGHT ARM   Final   Special Requests IN PEDIATRIC BOTTLE 0.5CC  Final   Culture   Final    NO GROWTH 3 DAYS Performed at [facility]    Report Status PENDING  Incomplete  C difficile quick scan w PCR reflex     Status: None   Collection Time: 09/20/15  6:55 PM  Result Value Ref Range Status   C Diff antigen NEGATIVE NEGATIVE Final   C Diff toxin NEGATIVE NEGATIVE Final   C Diff interpretation Negative for toxigenic C. difficile  Final  Gastrointestinal Panel by PCR , Stool     Status: None   Collection Time: 09/20/15  6:55 PM  Result Value Ref Range Status   Campylobacter species NOT DETECTED NOT DETECTED Final   Plesimonas shigelloides NOT DETECTED NOT DETECTED Final   Salmonella species NOT DETECTED NOT DETECTED Final   Yersinia enterocolitica NOT DETECTED NOT DETECTED Final   Vibrio species NOT DETECTED NOT DETECTED Final   Vibrio cholerae NOT DETECTED NOT DETECTED Final   Enteroaggregative E coli (EAEC) NOT DETECTED NOT DETECTED Final   Enteropathogenic E coli (EPEC) NOT DETECTED NOT DETECTED Final   Enterotoxigenic E coli (ETEC) NOT DETECTED NOT DETECTED Final   Shiga like toxin producing E coli (STEC) NOT DETECTED NOT DETECTED Final   E. coli O157 NOT DETECTED NOT DETECTED Final   Shigella/Enteroinvasive E coli (EIEC) NOT DETECTED NOT DETECTED Final   Cryptosporidium NOT DETECTED NOT DETECTED Final   Cyclospora cayetanensis NOT DETECTED NOT DETECTED Final  Entamoeba histolytica NOT DETECTED NOT DETECTED Final   Giardia lamblia NOT DETECTED NOT DETECTED Final   Adenovirus F40/41 NOT DETECTED NOT DETECTED Final   Astrovirus NOT DETECTED NOT DETECTED Final   Norovirus GI/GII NOT DETECTED NOT DETECTED Final   Rotavirus A NOT DETECTED NOT DETECTED Final   Sapovirus (I, II, IV, and V) NOT DETECTED NOT DETECTED Final  Culture, blood (single)     Status: None (Preliminary result)   Collection Time: 09/20/15  6:55 PM  Result Value Ref Range Status   Specimen Description BLOOD RIGHT  ARM  Final   Special Requests BOTTLES DRAWN AEROBIC AND ANAEROBIC  10CC  Final   Culture   Final    NO GROWTH 3 DAYS Performed at [facility]    Report Status PENDING  Incomplete     Studies: No results found.  Scheduled Meds:  Scheduled Meds: . sodium chloride   Intravenous Once  . antiseptic oral rinse  7 mL Mouth Rinse BID  . budesonide-formoterol  2 puff Inhalation BID  . calcium-vitamin D  1 tablet Oral BID  . ceFEPime (MAXIPIME) IV  2 g Intravenous Q8H  . cholecalciferol  1,000 Units Oral Daily  . diphenoxylate-atropine  2 tablet Oral QID  . gabapentin  600 mg Oral TID  . insulin aspart  0-9 Units Subcutaneous TID WC  . insulin glargine  5 Units Subcutaneous QHS  . lamoTRIgine  125 mg Oral BID  . niacin  750 mg Oral QHS  . ondansetron (ZOFRAN) IV  4 mg Intravenous Q6H  . potassium chloride  10 mEq Intravenous Q1 Hr x 6  . QUEtiapine  800 mg Oral QHS  . simvastatin  40 mg Oral q1800  . sodium chloride flush  10-40 mL Intracatheter Q12H  . sucralfate  1 g Oral TID WC & HS  . tiotropium  18 mcg Inhalation Daily  . vancomycin  1,250 mg Intravenous Q8H   Continuous Infusions: . 0.9 % NaCl with KCl 20 mEq / L 100 mL/hr at 09/23/15 1257    Time spent on care of this patient: 34 min   [place], MD 09/23/2015, 2:01 PM  LOS: 4 days   [facility] Office  [phone] Pager - Text Page per [email or web address] If 7PM-7AM, please contact night-coverage [email or web address]

## 2015-09-23 NOTE — Progress Notes (Signed)
Pt with decrease in bp. Dr. Wynelle Cleveland made aware. Order given for stat lactic acid and to call result in to night coverage. Order placed. VWilliams,rn.

## 2015-09-23 NOTE — Progress Notes (Signed)
Pt with low bp toward the end of shift and prior to start of platelet transfusion. Dr. Wynelle Cleveland made aware. Order given for stat lactic acid. Pt HR also up to 153 and c/o of SOB. O2 sats wnl btw 95 % -100% on RA. RRRN notified. HR up to 148 when RR nurse up on floor. Doreene Nest, Np made aware. NP on floor and ordered 500 ml of saline bolus and 2.5 mg IV metoprolol. Order carried out. Pt hr down to 121, bp 104/71.  On-coming rn aware of situation and report given as to interventions. Pt resting quietly in bed at this time [place].

## 2015-09-23 NOTE — Progress Notes (Signed)
Pt with critical potassium of 2.4  ( call received from lab). Dr. Wynelle Cleveland made aware. Gave order for 6 runs of potassium. VWilliams,rn.

## 2015-09-24 ENCOUNTER — Ambulatory Visit: Payer: Medicaid Other

## 2015-09-24 ENCOUNTER — Inpatient Hospital Stay (HOSPITAL_COMMUNITY): Payer: Medicaid Other

## 2015-09-24 ENCOUNTER — Ambulatory Visit
Admit: 2015-09-24 | Discharge: 2015-09-24 | Disposition: A | Discharge status: Home / Self Care | Payer: Medicaid Other | Attending: Radiation Oncology | Admitting: Radiation Oncology

## 2015-09-24 ENCOUNTER — Telehealth: Payer: Self-pay | Admitting: *Deleted

## 2015-09-24 DIAGNOSIS — K529 Noninfective gastroenteritis and colitis, unspecified: Secondary | ICD-10-CM

## 2015-09-24 LAB — BASIC METABOLIC PANEL
ANION GAP: 10 (ref 5–15)
ANION GAP: 6 (ref 5–15)
BUN: 8 mg/dL (ref 6–20)
BUN: 11 mg/dL (ref 6–20)
CALCIUM: 8.5 mg/dL — AB (ref 8.9–10.3)
CHLORIDE: 108 mmol/L (ref 101–111)
CO2: 16 mmol/L — ABNORMAL LOW (ref 22–32)
CO2: 17 mmol/L — ABNORMAL LOW (ref 22–32)
Calcium: 8.5 mg/dL — ABNORMAL LOW (ref 8.9–10.3)
Chloride: 111 mmol/L (ref 101–111)
Creatinine, Ser: 0.58 mg/dL (ref 0.44–1.00)
Creatinine, Ser: 0.75 mg/dL (ref 0.44–1.00)
GFR calc Af Amer: >60 mL/min (ref >60)
GFR calc Af Amer: >60 mL/min (ref >60)
GFR calc non Af Amer: >60 mL/min (ref >60)
GLUCOSE: 130 mg/dL — AB (ref 65–99)
Glucose, Bld: 135 mg/dL — ABNORMAL HIGH (ref 65–99)
POTASSIUM: 3.2 mmol/L — AB (ref 3.5–5.1)
Potassium: 4.4 mmol/L (ref 3.5–5.1)
SODIUM: 134 mmol/L — AB (ref 135–145)
SODIUM: 134 mmol/L — AB (ref 135–145)

## 2015-09-24 LAB — CBC
HEMATOCRIT: 21.6 % — AB (ref 36.0–46.0)
HEMOGLOBIN: 7.8 g/dL — AB (ref 12.0–15.0)
MCH: 29.8 pg (ref 26.0–34.0)
MCHC: 36.1 g/dL — ABNORMAL HIGH (ref 30.0–36.0)
MCV: 82.4 fL (ref 78.0–100.0)
Platelets: 21 10*3/uL — CL (ref 150–400)
RBC: 2.62 MIL/uL — ABNORMAL LOW (ref 3.87–5.11)
RDW: 17.6 % — AB (ref 11.5–15.5)
WBC: 0.1 10*3/uL — AB (ref 4.0–10.5)

## 2015-09-24 LAB — VANCOMYCIN, TROUGH: Vancomycin Tr: 25 ug/mL — ABNORMAL HIGH (ref 10.0–20.0)

## 2015-09-24 LAB — GLUCOSE, CAPILLARY
GLUCOSE-CAPILLARY: 117 mg/dL — AB (ref 65–99)
GLUCOSE-CAPILLARY: 120 mg/dL — AB (ref 65–99)
Glucose-Capillary: 136 mg/dL — ABNORMAL HIGH (ref 65–99)
Glucose-Capillary: 105 mg/dL — ABNORMAL HIGH (ref 65–99)

## 2015-09-24 LAB — PREPARE PLATELET PHERESIS: Unit division: 0

## 2015-09-24 LAB — LACTIC ACID, PLASMA: Lactic Acid, Venous: 1.8 mmol/L (ref 0.5–2.0)

## 2015-09-24 MED ORDER — INSULIN ASPART 100 UNIT/ML ~~LOC~~ SOLN: 0.0000 [IU] | Freq: Four times a day (QID) | SUBCUTANEOUS | Status: DC

## 2015-09-24 MED ORDER — POTASSIUM CHLORIDE 10 MEQ/50ML IV SOLN
10.0000 meq | INTRAVENOUS | Status: AC
  Administered 2015-09-24 (×3): 10 meq via INTRAVENOUS
  Filled 2015-09-24 (×4): qty 50

## 2015-09-24 MED ORDER — LORAZEPAM 2 MG/ML IJ SOLN
0.5000 mg | Freq: Four times a day (QID) | INTRAMUSCULAR | Status: DC | PRN
  Administered 2015-09-24: 1 mg via INTRAVENOUS
  Filled 2015-09-24: qty 1

## 2015-09-24 MED ORDER — POTASSIUM CHLORIDE IN NACL 20-0.9 MEQ/L-% IV SOLN
INTRAVENOUS | Status: DC
  Filled 2015-09-24: qty 1000

## 2015-09-24 MED ORDER — TRACE MINERALS CR-CU-MN-SE-ZN 10-1000-500-60 MCG/ML IV SOLN
INTRAVENOUS | Status: DC
  Filled 2015-09-24: qty 960

## 2015-09-24 MED ORDER — TBO-FILGRASTIM 480 MCG/0.8ML ~~LOC~~ SOSY
480.0000 ug | PREFILLED_SYRINGE | Freq: Every day | SUBCUTANEOUS | Status: DC
  Administered 2015-09-24: 480 ug via SUBCUTANEOUS
  Filled 2015-09-24 (×3): qty 0.8

## 2015-09-24 MED ORDER — METRONIDAZOLE IN NACL 5-0.79 MG/ML-% IV SOLN
500.0000 mg | Freq: Three times a day (TID) | INTRAVENOUS | Status: DC
  Administered 2015-09-24 (×2): 500 mg via INTRAVENOUS
  Filled 2015-09-24 (×3): qty 100

## 2015-09-24 MED ORDER — FAT EMULSION 20 % IV EMUL
240.0000 mL | INTRAVENOUS | Status: DC
  Filled 2015-09-24: qty 250

## 2015-09-24 MED ORDER — GUAIFENESIN-DM 100-10 MG/5ML PO SYRP
5.0000 mL | ORAL_SOLUTION | ORAL | Status: DC | PRN
  Administered 2015-09-24: 5 mL via ORAL
  Filled 2015-09-24 (×2): qty 10

## 2015-09-24 MED ORDER — VANCOMYCIN HCL 10 G IV SOLR: 1250.0000 mg | Freq: Two times a day (BID) | INTRAVENOUS | Status: DC

## 2015-09-25 ENCOUNTER — Ambulatory Visit: Payer: Medicaid Other

## 2015-09-25 ENCOUNTER — Other Ambulatory Visit: Payer: Self-pay

## 2015-09-25 LAB — CULTURE, BLOOD (SINGLE): Culture: NO GROWTH

## 2015-09-25 LAB — CULTURE, BLOOD (ROUTINE X 2): CULTURE: NO GROWTH

## 2015-09-25 MED FILL — Medication: Qty: 1 | Status: AC

## 2015-09-25 NOTE — Progress Notes (Signed)
At the top of the shift report was received from out going nurse. Patient was in stable condition, A&O. It was reported from day shift that the patient had multiple attempts to get out of bed unattended. Shortly after report was given patient attempted to get out of bed unattended. Patient was toileted at that time. There multiple attempts by the patient to get out of bed unattended with raised a safety concern. I inquired about a Air cabin crew but one was unavailable so a posey belt was placed on the patient. Prior to posey placement IV team attempted to place a peripheral IV so that antibiotics and TPN could run as scheduled but was unsuccessful. Antibiotic was run while TPN was delayed.  At [place] was administered.  At 2116/11/09 Protonix, Flagyl, Morphine and Zofran was administered. At 11-10-2146 Carmex applied and mouth was swabbed. Patient was then toileted and a posey belt was placed. Patient was then made comfortable. At 11/10/2254 I went to check on the patient. While I was at the patients door I received a phone call from CMT alerting me of the patients brady heart rate. I saw a pool of yellow liquid on the floor the had leaked over from the opposite side of the bed. The patient was laying on her left side facing away from the door. When I went around to check on the patient and to find the source of the liquid, I found it was coming from both the nose and mouth. A code was called. Suction was stared and CPR began until the code team arrived. Actions during the code have been documented. The on call was notified and the family was called. Time of death was called at 11/09/13.

## 2015-09-25 NOTE — Accreditation Note (Signed)
o Restraints reported to CMS  Pursuant to regulation 482.13 (G) (3) use of restraints was logged and CMS was notified via email on 02.14.2017 0700 by Evette Cristal, RN, Patient Safety and Accreditation.

## 2015-09-26 ENCOUNTER — Ambulatory Visit: Payer: Medicaid Other

## 2015-09-27 ENCOUNTER — Ambulatory Visit: Payer: Medicaid Other

## 2015-09-27 ENCOUNTER — Encounter: Payer: Self-pay | Admitting: Radiation Oncology

## 2015-09-28 ENCOUNTER — Encounter: Payer: Self-pay | Admitting: Radiation Oncology

## 2015-09-28 ENCOUNTER — Ambulatory Visit: Payer: Medicaid Other

## 2015-09-29 NOTE — Discharge Summary (Addendum)
Death Summary  Sarah Leonard HDQ:[id number] DOB: August 13, 1960 DOA: 10-16-2015  PCP: Moreen Fowler    Admit date: 2015/10/16 Date of Death: 10-29-15  Final Diagnoses:  Principal Problem:   Nausea & vomiting Active Problems:   Ileus   Antineoplastic chemotherapy induced pancytopenia (HCC)   COPD (chronic obstructive pulmonary disease) (HCC)   Hyperlipidemia   GERD (gastroesophageal reflux disease)   Hypokalemia     Sarah Leonard was found bradycardic, unresponsive with vomit coming from her nose and mouth. She subsequently became pulseless and required CPR. Unfortunately, she was not able to be revived. It appears that she aspirated on her vomit.  Below is my last progress note from when I last rounded on her.   Brief narrative: Sarah Leonard is a 55 y.o. female presenting from [facility] with a recent diagnosis of small cell lung cancer and history of COPD, tobacco abuse depression, diabetes mellitus, hyperlipidemia, bipolar disorder, alcohol and cocaine abuse in the past who presents to the hospital with vomiting and diarrhea occurring post-chemotherapy. There is a note from the day of admission that also states that she also had a temperature 101.7 but there have been no fevers noted since she has been admitted to the hospital.  She is undergoing chemotherapy which is being given by Dr. Julien Nordmann. She is also receiving radiotherapy under the care of Dr. Tammi Klippel.   Subjective: Diarrhea has stopped but still having abdominal pain and retching. No cough, sore throat, sinus drainage, dysuria.   Assessment/Plan: Principal Problem:  Nausea & vomiting/mild diarrhea -As a result of chemotherapy -continue Zofran to every 6 hours routine and IV Phenergan -Continue IV fluids and pain control - c diff and Gi pathogen panel negative - CT scan 2/12 shows inflammation of jejunum and ileum along with ileus  Ileus - NPO-   Neutropenic fever - Started on  2/9- blood cultures NTD - UA and c diff-negative - GI pathogen panel also negative - Flagyl, Cefepime and Vancomycin  - fevers have resolved for now- cont to follow closely  Severe Hypokalemia/hypomagnesemia - not improving despite large amount of electrolyte replacement- likely due to ongoing GI losses - continuing to replace agressively  - Unable to take oral replacement due to vomiting - continue IV potassium and magnesium replacement - recheck Bmet this evening   Antineoplastic chemotherapy induced pancytopenia  - continue Granix daily as WBC counts not improving - Holding heparin and Lovenox due to thrombocytopenia  - transfused 1 U PRBC and 2 U platelets on 2/10 and on 2/12  Small cell lung cancer- (T1a, N2, M0) -Under the care of Dr. Julien Nordmann and Dr. Tammi Klippel -Received 3 cycles of chemotherapy- last cycle was 1/31   COPD (chronic obstructive pulmonary disease)  - Appears stable at this time - Is receiving Spiriva and nebulizer treatments   Hyperlipidemia -Is receiving Zocor and niacin- hold for now   GERD (gastroesophageal reflux disease) -Is receiving sucralfate  Diabetes mellitus -sugars stable- continue low-dose Lantus and sliding scale insulin    Antibiotics: Anti-infectives    Start   Dose/Rate Route Frequency Ordered Stop   09/22/15 1400  vancomycin (VANCOCIN) 1,250 mg in sodium chloride 0.9 % 250 mL IVPB    1,250 mg 166.7 mL/hr over 90 Minutes Intravenous Every 8 hours 09/22/15 1135    09/20/15 1630  vancomycin (VANCOCIN) IVPB 1000 mg/200 mL premix Status: Discontinued    1,000 mg 200 mL/hr over 60 Minutes Intravenous Every 8 hours 09/20/15 1521 09/22/15 1135  09/20/15 1600  ceFEPIme (MAXIPIME) 2 g in dextrose 5 % 50 mL IVPB    2 g 100 mL/hr over 30 Minutes Intravenous Every 8 hours 09/20/15 1521      Code Status:     Code Status Orders        Start   Ordered   09/13/2015 1746   Full code Continuous       Code Status History    Date Active Date Inactive Code Status Order ID Comments User Context   07/22/2015 11:25 AM 07/25/2015 5:45 PM Full Code [id number]  Samella Parr, NP ED   06/17/2015 5:04 AM 06/22/2015 5:59 PM Full Code [id number]  Ivor Costa, MD ED   06/06/2015 3:10 AM 06/11/2015 10:05 PM Full Code [id number]  Edwin Dada, MD Inpatient   11/25/2014 2:13 AM 11/29/2014 8:07 PM Full Code [id number]  Rise Patience, MD ED   02/28/2013 11:55 AM 03/05/2013 3:08 PM Full Code [id number]  Marijean Heath, NP ED     Family Communication:  Disposition Plan: Unable to determine discharge date  DVT prophylaxis: SCDs Consultants:  Procedures:     Objective: Filed Weights   09/19/15 0518  Weight: 80.6 kg (177 lb 11.1 oz)    Intake/Output Summary (Last 24 hours) at 2015/10/07 1304 Last data filed at 10-07-15 0843  Gross per 24 hour  Intake 4595.67 ml  Output  1701 ml  Net 2894.67 ml     Vitals Filed Vitals:   2015-10-07 0200 07-Oct-2015 0449 2015-10-07 0929 10/07/15 1116  BP: 98/60 109/67 101/61   Pulse: 140 149 142 147  Temp: 99.2 F (37.3 C) 99.1 F (37.3 C) 99.4 F (37.4 C)   TempSrc: Oral Oral Oral   Resp: '25 28 26   '$ Height:      Weight:      SpO2: 98% 97% 99% 98%    Exam:  General: Pt is alert, not in acute distress  HEENT: No icterus, No thrush, oral mucosa moist  Cardiovascular: regular rate and rhythm, S1/S2 No murmur  Respiratory: clear to auscultation bilaterally   Abdomen: Soft, +Bowel sounds, abdomen is diffusely tender, non distended, no guarding  MSK: No cyanosis or clubbing- no pedal edema   Data Reviewed: Basic Metabolic Panel:  Last Labs      Recent Labs Lab 09/19/15 0539 09/19/15 1600 09/20/15 0521  09/21/15 0835 09/22/15 0300 09/22/15 0340 09/22/15 1800  09/23/15 1640 10-07-2015 0645  NA 139 134* 131* < > 130* 130* --  130* 133* 134*  K 2.9* 2.6* 2.4* < > 2.0* <2.0* --  <2.0* 2.4* 3.2*  CL 108 104 102 < > 101 100* --  101 106 108  CO2 21* 20* 19* < > 20* 20* --  20* 18* 16*  GLUCOSE 206* 229* 237* < > 196* 186* --  174* 147* 135*  BUN '8 9 7 '$ < > 7 7 --  6 5* 8  CREATININE 0.42* 0.45 0.42* < > 0.44 0.41* --  0.36* 0.47 0.58  CALCIUM 8.5* 7.9* 8.0* < > 7.5* 7.7* --  7.5* 8.0* 8.5*  MG 1.3* 1.6* 1.5* --  --  --  1.5* 1.9 --  --   PHOS 3.0 --  --  --  --  --  --  --  --  --   < > = values in this interval not displayed.   Liver Function Tests:  Last Labs     No results for input(s): AST, ALT, ALKPHOS, BILITOT, PROT, ALBUMIN  in the last 168 hours.    Last Labs     No results for input(s): LIPASE, AMYLASE in the last 168 hours.    Last Labs     No results for input(s): AMMONIA in the last 168 hours.   CBC:  Last Labs      Recent Labs Lab 09/19/15 0539  09/21/15 0500 09/21/15 0835 09/22/15 0300 09/23/15 0920 10-21-2015 0645  WBC 0.1* < > <0.1* <0.1* 0.1* <0.1* 0.1*  NEUTROABS 0.0* --  --  --  --  --  --   HGB 8.4* < > 7.6* 7.4* 8.9* 7.9* 7.8*  HCT 24.0* < > 20.8* 20.2* 24.7* 21.4* 21.6*  MCV 87.9 < > 83.9 83.8 82.6 80.1 82.4  PLT 58* < > 6* <5* 16* <5* 21*  < > = values in this interval not displayed.   Cardiac Enzymes:  Last Labs     No results for input(s): CKTOTAL, CKMB, CKMBINDEX, TROPONINI in the last 168 hours.   BNP (last 3 results)  Recent Labs (within last 365 days)     Recent Labs  06/05/15 2050 08/09/15 0113  BNP 20.5 25.1      ProBNP (last 3 results)  Recent Labs (within last 365 days)    No results for input(s): PROBNP in the last 8760 hours.    CBG:  Last Labs      Recent Labs Lab  09/23/15 1153 09/23/15 1705 09/23/15 2213 2015/10/21 0759 2015-10-21 1121  GLUCAP 133* 138* 117* 136* 120*      Recent Results (from the past 240 hour(s))  Culture, blood (routine x 2) Status: None   Collection Time: 09/19/2015 12:17 PM  Result Value Ref Range Status   Specimen Description PORTA CATH  Final   Special Requests BOTTLES DRAWN AEROBIC AND ANAEROBIC 5CC  Final   Culture   Final    NO GROWTH 5 DAYS Performed at [facility]    Report Status 09/21/2015 FINAL  Final  Culture, blood (routine x 2) Status: None   Collection Time: 10/08/2015 12:18 PM  Result Value Ref Range Status   Specimen Description RIGHT ANTECUBITAL  Final   Special Requests BOTTLES DRAWN AEROBIC AND ANAEROBIC 5CC  Final   Culture   Final    NO GROWTH 5 DAYS Performed at [facility]    Report Status 09/21/2015 FINAL  Final  Urine culture Status: None   Collection Time: 09/27/2015 2:08 PM  Result Value Ref Range Status   Specimen Description URINE, CLEAN CATCH  Final   Special Requests NONE  Final   Culture   Final    NO GROWTH 1 DAY Performed at [facility]    Report Status 09/17/2015 FINAL  Final  Culture, blood (Routine X 2) w Reflex to ID Panel Status: None (Preliminary result)   Collection Time: 09/20/15 4:09 PM  Result Value Ref Range Status   Specimen Description BLOOD RIGHT ARM  Final   Special Requests IN PEDIATRIC BOTTLE 0.5CC  Final   Culture   Final    NO GROWTH 3 DAYS Performed at [facility]    Report Status PENDING  Incomplete  C difficile quick scan w PCR reflex Status: None   Collection Time: 09/20/15 6:55 PM  Result Value Ref Range Status   C Diff antigen NEGATIVE NEGATIVE Final   C Diff toxin NEGATIVE NEGATIVE Final   C Diff interpretation Negative for toxigenic C.  difficile  Final  Gastrointestinal Panel by PCR ,  Stool Status: None   Collection Time: 09/20/15 6:55 PM  Result Value Ref Range Status   Campylobacter species NOT DETECTED NOT DETECTED Final   Plesimonas shigelloides NOT DETECTED NOT DETECTED Final   Salmonella species NOT DETECTED NOT DETECTED Final   Yersinia enterocolitica NOT DETECTED NOT DETECTED Final   Vibrio species NOT DETECTED NOT DETECTED Final   Vibrio cholerae NOT DETECTED NOT DETECTED Final   Enteroaggregative E coli (EAEC) NOT DETECTED NOT DETECTED Final   Enteropathogenic E coli (EPEC) NOT DETECTED NOT DETECTED Final   Enterotoxigenic E coli (ETEC) NOT DETECTED NOT DETECTED Final   Shiga like toxin producing E coli (STEC) NOT DETECTED NOT DETECTED Final   E. coli O157 NOT DETECTED NOT DETECTED Final   Shigella/Enteroinvasive E coli (EIEC) NOT DETECTED NOT DETECTED Final   Cryptosporidium NOT DETECTED NOT DETECTED Final   Cyclospora cayetanensis NOT DETECTED NOT DETECTED Final   Entamoeba histolytica NOT DETECTED NOT DETECTED Final   Giardia lamblia NOT DETECTED NOT DETECTED Final   Adenovirus F40/41 NOT DETECTED NOT DETECTED Final   Astrovirus NOT DETECTED NOT DETECTED Final   Norovirus GI/GII NOT DETECTED NOT DETECTED Final   Rotavirus A NOT DETECTED NOT DETECTED Final   Sapovirus (I, II, IV, and V) NOT DETECTED NOT DETECTED Final  Culture, blood (single) Status: None (Preliminary result)   Collection Time: 09/20/15 6:55 PM  Result Value Ref Range Status   Specimen Description BLOOD RIGHT ARM  Final   Special Requests BOTTLES DRAWN AEROBIC AND ANAEROBIC 10CC  Final   Culture   Final    NO GROWTH 3 DAYS Performed at [facility]    Report Status PENDING  Incomplete     Studies:  Imaging Results (Last 48 hours)    Ct Abdomen Pelvis W  Contrast  09/23/2015 CLINICAL DATA: Vomiting and diarrhea post chemotherapy. Newly diagnosed small cell lung cancer. Also receiving XRT. Prior cholecystectomy and appendectomy. Diarrhea. EXAM: CT ABDOMEN AND PELVIS WITH CONTRAST TECHNIQUE: Multidetector CT imaging of the abdomen and pelvis was performed using the standard protocol following bolus administration of intravenous contrast. CONTRAST: 17m OMNIPAQUE IOHEXOL 300 MG/ML SOLN COMPARISON: PET-CT dated 07/12/2015. CT abdomen pelvis dated 06/17/2015. FINDINGS: Lower chest: Prior right lower lobe nodular opacity has essentially resolved. Minimal patchy opacity anteriorly in the right lower lobe (series 5/image 5), possibly reflecting early/ developing radiation changes. Hepatobiliary: Liver is within normal limits. No suspicious/enhancing hepatic lesions. Status post cholecystectomy. Mild periportal edema. No intrahepatic or extrahepatic ductal dilatation. Pancreas: Within normal limits. Spleen: Within normal limits. Adrenals/Urinary Tract: Adrenal glands are within normal limits. Kidneys are within normal limits. No hydronephrosis. Bladder is within normal limits. Stomach/Bowel: Mild diffuse wall thickening with fluid along the distal esophagus (series 2/ image 2), raising the possibility of distal esophagitis. Moderately distended stomach. Mildly distended proximal small bowel. Mid/distal small bowel, including long segments of jejunum/ ileum, are notable for diffuse wall thickening (for example, series 2/ image 75), extending to the terminal ileum (series 2/image 57). Colon is relatively decompressed. Overall appearance is worrisome for infectious/inflammatory enteritis, possibly secondary to chemotherapy, with associated small bowel ileus. Vascular/Lymphatic: Atherosclerotic calcifications of the abdominal aorta and branch vessels. No evidence of abdominal aortic aneurysm. No suspicious abdominopelvic lymphadenopathy. Reproductive: Uterus is within  normal limits. Bilateral ovaries are within normal limits. Other: Small volume perihepatic and pelvic ascites. Musculoskeletal: Degenerative changes of the visualized thoracolumbar spine. IMPRESSION: Suspected infectious/ inflammatory enteritis involving jejunum and ileum, possibly secondary to chemotherapy. Dilated loops of proximal small  bowel is favored to reflect secondary small bowel ileus. Wall thickening involving the distal esophagus, correlate for radiation induced esophagitis. Small volume abdominopelvic ascites. Prior right lower lobe pulmonary nodule has essentially resolved. New patchy opacity along the anterior right lower lobe may reflect early/ developing radiation changes. Electronically Signed By: Julian Hy M.D. On: 09/23/2015 15:03   Dg Chest Port 1 View  2015/10/19 CLINICAL DATA: Dyspnea EXAM: PORTABLE CHEST 1 VIEW COMPARISON: 09/13/2015 FINDINGS: Mild patchy bilateral lower lobe opacities, suspicious for pneumonia. No frank interstitial edema. No pleural effusion or pneumothorax. Cardiomegaly. Right chest port terminates in the upper right atrium. IMPRESSION: Mild patchy bilateral lower lobe opacities, suspicious for pneumonia. Electronically Signed By: Julian Hy M.D. On: 10/19/15 10:55   Dg Abd Portable 1v  10/19/15 CLINICAL DATA: Abdominal distention. EXAM: PORTABLE ABDOMEN - 1 VIEW COMPARISON: Abdominal CT from yesterday FINDINGS: Unchanged distension of colonic and small bowel loops. The transverse colon measures up to 6 cm in diameter. There is associated fold thickening seen at the level of the transverse colonic haustra and left lower quadrant small bowel loops. No concerning intra-abdominal mass effect. Cholecystectomy clips. IMPRESSION: Unchanged small bowel/colonic distention with fold thickening compared to yesterday's CT. Electronically Signed By: Monte Fantasia M.D. On: 10-19-2015 10:50     Scheduled Meds:  Scheduled Meds: .  antiseptic oral rinse 7 mL Mouth Rinse BID  . budesonide-formoterol 2 puff Inhalation BID  . calcium-vitamin D 1 tablet Oral BID  . ceFEPime (MAXIPIME) IV 2 g Intravenous Q8H  . cholecalciferol 1,000 Units Oral Daily  . gabapentin 600 mg Oral TID  . insulin aspart 0-9 Units Subcutaneous TID WC  . insulin glargine 5 Units Subcutaneous QHS  . lamoTRIgine 125 mg Oral BID  . ondansetron (ZOFRAN) IV 4 mg Intravenous Q6H  . pantoprazole (PROTONIX) IV 40 mg Intravenous Q12H  . QUEtiapine 800 mg Oral QHS  . simvastatin 40 mg Oral q1800  . sodium chloride flush 10-40 mL Intracatheter Q12H  . sucralfate 1 g Oral TID WC & HS  . tiotropium 18 mcg Inhalation Daily  . vancomycin 1,250 mg Intravenous Q8H   Continuous Infusions: . 0.9 % NaCl with KCl 20 mEq / L 100 mL/hr at 09/23/15 1257    Time spent on care of this patient: 35 min          Signed:  [facility] 09/29/2015, 9:13 AM

## 2015-10-01 ENCOUNTER — Ambulatory Visit: Payer: Medicaid Other

## 2015-10-02 ENCOUNTER — Encounter: Payer: Self-pay | Admitting: Nutrition

## 2015-10-02 ENCOUNTER — Ambulatory Visit: Payer: Medicaid Other

## 2015-10-02 ENCOUNTER — Other Ambulatory Visit: Payer: Self-pay

## 2015-10-02 ENCOUNTER — Ambulatory Visit: Payer: Self-pay

## 2015-10-02 ENCOUNTER — Ambulatory Visit: Payer: Self-pay | Admitting: Internal Medicine

## 2015-10-03 ENCOUNTER — Ambulatory Visit: Payer: Medicaid Other

## 2015-10-03 ENCOUNTER — Ambulatory Visit: Payer: Self-pay

## 2015-10-04 ENCOUNTER — Ambulatory Visit: Payer: Medicaid Other

## 2015-10-04 ENCOUNTER — Ambulatory Visit: Payer: Self-pay

## 2015-10-05 ENCOUNTER — Ambulatory Visit: Payer: Medicaid Other

## 2015-10-06 ENCOUNTER — Ambulatory Visit: Payer: Self-pay

## 2015-10-06 ENCOUNTER — Ambulatory Visit: Payer: Medicaid Other

## 2015-10-08 ENCOUNTER — Ambulatory Visit: Payer: Medicaid Other

## 2015-10-09 ENCOUNTER — Other Ambulatory Visit: Payer: Self-pay

## 2015-10-10 NOTE — Progress Notes (Addendum)
Pharmacy Antibiotic Note  Sarah Leonard is a 55 y.o. female admitted on 10/06/2015 with sepsis. Today is day 5 of vancomycin and cefepime.  Pt neutropenic but remains afebrile.  CXR negative.  UA negative.  No issues regarding implanted port.  Today, 23-Oct-2015: Temp: afebrile WBC: 0.1 d/t chemo; Neulasta 2/4; Granix 2/7, 2/9, 2/12 >> Renal: SCr stable wnl; CrCl 80 CG; UOP good LA resolved 2/12  Plan:  Continue Cefepime 2g IV q8h.  Reduce Vancomycin to 1250 mg IV q12, restarting tomorrow. Will check random level tomorrow to ensure clearing before resuming doses  Flagyl per MD  Height: '5\' 3"'$  (160 cm) Weight: 177 lb 11.1 oz (80.6 kg) IBW/kg (Calculated) : 52.4  Temp (24hrs), Avg:98.9 F (37.2 C), Min:98 F (36.7 C), Max:100 F (37.8 C)   Recent Labs Lab 09/21/15 0500 09/21/15 0835 09/22/15 0300 09/22/15 0939 09/22/15 1800 09/23/15 0920 09/23/15 1640 09/23/15 1933 09/23/15 2330 2015/10/23 0645 2015/10/23 1345  WBC <0.1* <0.1* 0.1*  --   --  <0.1*  --   --   --  0.1*  --   CREATININE 0.30* 0.44 0.41*  --  0.36*  --  0.47  --   --  0.58  --   LATICACIDVEN  --   --   --   --   --   --   --  2.5* 1.8  --   --   VANCOTROUGH  --   --   --  8*  --   --   --   --   --   --  25*    Estimated Creatinine Clearance: 79.9 mL/min (by C-G formula based on Cr of 0.58).    No Known Allergies   Antimicrobials this admission: 2/9 vanc >>  2/9 cefepime >> 2/13 Flagyl (MD) >>  Levels/dose changes this admission: 2/11 0800 VT = 8 mcg/ml on 1gm q8h (was supposed to be prior to 6th dose but appears that patient missed her 4th dose due to blood transfusion occuring at that time and dose was never given. This vanc trough reflects a level after only 2 maintenance doses) 2/13: 1330 VT = 25 on 1250 q8 hr  Microbiology results: 2/5 bcx x2: NGF 2/5 ucx: neg FINAL 2/9 bcx x2: ngtd 2/9 C. Diff: -/- 2/9 GI panel: negative   Thank you for allowing pharmacy to be a part of this patient's  care.  Reuel Boom, PharmD, BCPS Pager: [phone] 23-Oct-2015, 3:35 PM

## 2015-10-10 NOTE — Telephone Encounter (Signed)
No treat today in radiation,  Neutropenic  Still, low hgb,plts low  Notified [place] #3 9:52 AM

## 2015-10-10 NOTE — ED Provider Notes (Signed)
11:22 PM Called to the floor due to CPR. Upon arrival, patient had been found pulseless and apneic with copious amounts of vomiting everywhere. Patient's nurse stated that patient had had bradycardia and then when she found the patient she was pulseless lifeless. Nurses has started a suspect protocol and clean chest compressions. Patient was asystolic on arrival. CRNA was at the bedside and placed the endotracheal tube after one attempt. There was no CO2 change and the tube was immediately removed. I was able to intubate the patient with positive CO2 change. ACS protocol was continued for approximately 15 minutes when a force the patient was pronounced deceased at 4.  Lacretia Leigh, MD 10/12/15 2329

## 2015-10-10 NOTE — Care Management Note (Signed)
Case Management Note  Patient Details  Name: Sarah Leonard MRN: [id number] Date of Birth: [birth year]  Subjective/Objective:  N/v, low K.iv abx x2. CSW following for SNF.                  Action/Plan:d/c plan SNF.   Expected Discharge Date:   (unknown)               Expected Discharge Plan:  Skilled Nursing Facility  In-House Referral:  Clinical Social Work  Discharge planning Services  CM Consult  Post Acute Care Choice:    Choice offered to:     DME Arranged:    DME Agency:     HH Arranged:    [place] Agency:     Status of Service:  In process, will continue to follow  Medicare Important Message Given:    Date Medicare IM Given:    Medicare IM give by:    Date Additional Medicare IM Given:    Additional Medicare Important Message give by:     If discussed at [place] of Stay Meetings, dates discussed:    Additional Comments:  Dessa Phi, RN Oct 01, 2015, 12:01 PM

## 2015-10-10 NOTE — Progress Notes (Signed)
Patient refusing all PO med's.

## 2015-10-10 NOTE — Progress Notes (Signed)
PARENTERAL NUTRITION CONSULT NOTE - INITIAL  Pharmacy Consult for TPN Indication: Prolonged Ileus  No Known Allergies  Patient Measurements: Height: '5\' 3"'$  (160 cm) Weight: 177 lb 11.1 oz (80.6 kg) IBW/kg (Calculated) : 52.4 Adjusted Body Weight: 65.4 kg Usual Weight: 86 kg   Vital Signs: Temp: 98 F (36.7 C) (02/13 1332) Temp Source: Oral (02/13 1332) BP: 103/51 mmHg (02/13 1332) Pulse Rate: 145 (02/13 1332) Intake/Output from previous day: 02/12 0701 - 02/13 0700 In: 5875.7 [P.O.:1080; I.V.:2300; Blood:445.7; IV Piggyback:2050] Out: 2227 [RXVQM:0867; Emesis/NG output:300; Stool:2] Intake/Output from this shift: Total I/O In: -  Out: 200 [Urine:200]  Labs:  Recent Labs  09/22/15 0300 09/23/15 0920 10/13/15 0645  WBC 0.1* <0.1* 0.1*  HGB 8.9* 7.9* 7.8*  HCT 24.7* 21.4* 21.6*  PLT 16* <5* 21*     Recent Labs  09/22/15 0340 09/22/15 1800 09/23/15 1640 10-13-2015 0645  NA  --  130* 133* 134*  K  --  <2.0* 2.4* 3.2*  CL  --  101 106 108  CO2  --  20* 18* 16*  GLUCOSE  --  174* 147* 135*  BUN  --  6 5* 8  CREATININE  --  0.36* 0.47 0.58  CALCIUM  --  7.5* 8.0* 8.5*  MG 1.5* 1.9  --   --    Estimated Creatinine Clearance: 79.9 mL/min (by C-G formula based on Cr of 0.58).    Recent Labs  09/23/15 2213 10/13/2015 0759 10-13-2015 1121  GLUCAP 117* 136* 120*    Medical History: Past Medical History  Diagnosis Date  . Pancreatitis   . Anemia   . Bipolar 1 disorder ([place])   . Alcohol abuse   . 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 COPD (chronic obstructive pulmonary disease) ([place])   . Hypotension   . Major depressive disorder ([place])   . Cocaine abuse   . Chronic back pain   . Renal disorder   . Acidosis   . Hyperkalemia   . Abscess   . Acute encephalopathy   . Septic shock ([place])   . Chemotherapy induced neutropenia ([place]) 08/08/2015  . Diabetes mellitus   . Lung cancer ([place])    Insulin Requirements: 3 units aspart + 5 units  glargine yesterday  Current Nutrition: NPO  IVF: NS + 20 meq K @ 100 ml/hr  Central access: single lumen PAC placed 07/17/15 TPN start date: 2/13  ASSESSMENT                                                                                                          HPI: 55 y.o. female presenting from [facility] with a recent diagnosis of small cell lung cancer and history of COPD, tobacco abuse depression, diabetes mellitus, hyperlipidemia, bipolar disorder, alcohol and cocaine abuse in the past who presents to the hospital with vomiting and diarrhea occurring post-chemotherapy.  Diarrhea stopped but still with abdominal pain and vomiting.  CT from 2/12 shows infectious vs inflammatory enteritis of jejunum/ileum with probable ileus;  pharmacy to begin TPN  Significant events:  Patient does have peripheral line (not charted) in addition to [facility]; will begin chemo through [facility]  Today:    Glucose - at goal (<150) since yesterday  Electrolytes - K, bicarb low; Na borderline low.  CorrCa, Cl wnl.  Mg/Phos wnl when last checked    Renal - SCr low but stable; CrCl 80  LFTs - wnl  TGs - pending  Prealbumin - pending  NUTRITIONAL GOALS                                                                                             RD recs: Kcal: 1900-2100 Protein: 95-105 grams Fluid: >/= 2L  Clinimix 5/15 at a goal rate of 85 ml/hr + 20% fat emulsion at 10 ml/hr to provide: 102 g/day protein, 1928 Kcal/day.  PLAN                                                                                                                         At 1800 today:  Start Clinimix E 5/15 at 40 ml/hr.  20% fat emulsion at 53m/hr.  Plan to advance as tolerated to the goal rate.  TPN to contain standard multivitamins and trace elements.  Reduce IVF to 674mhr.  KCl 10 mEq IV x 4  Continue sensitive SSI and CBG checks q6h + Lantus 5 units daily  TPN lab panels tomorrow and on  Mondays & Thursdays.  F/u daily.  DrReuel BoomPharmD, BCPS Pager: [phone]/2017-02-174:28 PM

## 2015-10-10 NOTE — Progress Notes (Signed)
[facility] Progress Note   Sarah Leonard  JOI:[id number]  DOB: 1961/02/07  DOA: 09/28/2015 PCP: Moreen Fowler  Brief narrative: Sarah Leonard is a 55 y.o. female presenting from [facility] with a recent diagnosis of small cell lung cancer and history of COPD, tobacco abuse depression, diabetes mellitus, hyperlipidemia, bipolar disorder, alcohol and cocaine abuse in the past who presents to the hospital with vomiting and diarrhea occurring post-chemotherapy. There is a note from the day of admission that also states that she also had a temperature 101.7 but there have been no fevers noted since she has been admitted to the hospital.  She is undergoing chemotherapy which is being given by Dr. Julien Nordmann. She is also receiving radiotherapy under the care of Dr. Tammi Klippel.   Subjective: Diarrhea has stopped but still having abdominal pain and retching. No cough, sore throat, sinus drainage, dysuria.   Assessment/Plan: Principal Problem:   Nausea & vomiting/mild diarrhea -As a result of chemotherapy -continue Zofran to every 6 hours routine and IV Phenergan -Continue IV fluids and pain control - c diff and Gi pathogen panel negative - CT scan 2/12 shows inflammation of jejunum and ileum along with ileus  Ileus - NPO-    Neutropenic fever - Started on 2/9-  blood cultures NTD - UA and c diff-negative - GI pathogen panel also negative - Flagyl, Cefepime and Vancomycin  - fevers have resolved for now- cont to follow closely  Severe Hypokalemia/hypomagnesemia - not improving despite large amount of electrolyte replacement- likely due to ongoing GI losses - continuing to replace agressively  - Unable to take oral replacement due to vomiting - continue IV potassium  and magnesium replacement - recheck Bmet this evening    Antineoplastic chemotherapy induced pancytopenia  - continue Granix daily as WBC counts not improving - Holding heparin and  Lovenox due to thrombocytopenia  - transfused 1 U PRBC and 2 U platelets on 2/10 and on 2/12  Small cell lung cancer- (T1a, N2, M0) -Under the care of Dr. Julien Nordmann and Dr. Tammi Klippel -Received 3 cycles of chemotherapy- last cycle was 1/31    COPD (chronic obstructive pulmonary disease)  - Appears stable at this time - Is receiving Spiriva and nebulizer treatments    Hyperlipidemia -Is receiving Zocor and niacin- hold for now    GERD (gastroesophageal reflux disease) -Is receiving sucralfate  Diabetes mellitus -sugars stable- continue low-dose Lantus and sliding scale insulin    Antibiotics: Anti-infectives    Start     Dose/Rate Route Frequency Ordered Stop   09/22/15 1400  vancomycin (VANCOCIN) 1,250 mg in sodium chloride 0.9 % 250 mL IVPB     1,250 mg 166.7 mL/hr over 90 Minutes Intravenous Every 8 hours 09/22/15 1135     09/20/15 1630  vancomycin (VANCOCIN) IVPB 1000 mg/200 mL premix  Status:  Discontinued     1,000 mg 200 mL/hr over 60 Minutes Intravenous Every 8 hours 09/20/15 1521 09/22/15 1135   09/20/15 1600  ceFEPIme (MAXIPIME) 2 g in dextrose 5 % 50 mL IVPB     2 g 100 mL/hr over 30 Minutes Intravenous Every 8 hours 09/20/15 1521       Code Status:     Code Status Orders        Start     Ordered   10/03/2015 1746  Full code   Continuous     09/29/2015 1745    Code Status History    Date Active Date Inactive  Code Status Order ID Comments User Context   07/22/2015 11:25 AM 07/25/2015  5:45 PM Full Code [id number]  Samella Parr, NP ED   06/17/2015  5:04 AM 06/22/2015  5:59 PM Full Code [id number]  Ivor Costa, MD ED   06/06/2015  3:10 AM 06/11/2015 10:05 PM Full Code [id number]  Edwin Dada, MD Inpatient   11/25/2014  2:13 AM 11/29/2014  8:07 PM Full Code [id number]  Rise Patience, MD ED   02/28/2013 11:55 AM 03/05/2013  3:08 PM Full Code [id number]  Marijean Heath, NP ED     Family Communication:  Disposition Plan: Unable to determine discharge  date  DVT prophylaxis:  SCDs Consultants:   Procedures:     Objective: Filed Weights   09/19/15 0518  Weight: 80.6 kg (177 lb 11.1 oz)    Intake/Output Summary (Last 24 hours) at 2015/10/22 1304 Last data filed at 2015-10-22 0843  Gross per 24 hour  Intake 4595.67 ml  Output   1701 ml  Net 2894.67 ml     Vitals Filed Vitals:   Oct 22, 2015 0200 10-22-15 0449 10-22-2015 0929 22-Oct-2015 1116  BP: 98/60 109/67 101/61   Pulse: 140 149 142 147  Temp: 99.2 F (37.3 C) 99.1 F (37.3 C) 99.4 F (37.4 C)   TempSrc: Oral Oral Oral   Resp: '25 28 26   '$ Height:      Weight:      SpO2: 98% 97% 99% 98%    Exam:  General:  Pt is alert, not in acute distress  HEENT: No icterus, No thrush, oral mucosa moist  Cardiovascular: regular rate and rhythm, S1/S2 No murmur  Respiratory: clear to auscultation bilaterally   Abdomen: Soft, +Bowel sounds, abdomen is diffusely tender, non distended, no guarding  MSK: No cyanosis or clubbing- no pedal edema   Data Reviewed: Basic Metabolic Panel:  Recent Labs Lab 09/19/15 0539 09/19/15 1600 09/20/15 0521  09/21/15 0835 09/22/15 0300 09/22/15 0340 09/22/15 1800 09/23/15 1640 Oct 22, 2015 0645  NA 139 134* 131*  < > 130* 130*  --  130* 133* 134*  K 2.9* 2.6* 2.4*  < > 2.0* <2.0*  --  <2.0* 2.4* 3.2*  CL 108 104 102  < > 101 100*  --  101 106 108  CO2 21* 20* 19*  < > 20* 20*  --  20* 18* 16*  GLUCOSE 206* 229* 237*  < > 196* 186*  --  174* 147* 135*  BUN '8 9 7  '$ < > 7 7  --  6 5* 8  CREATININE 0.42* 0.45 0.42*  < > 0.44 0.41*  --  0.36* 0.47 0.58  CALCIUM 8.5* 7.9* 8.0*  < > 7.5* 7.7*  --  7.5* 8.0* 8.5*  MG 1.3* 1.6* 1.5*  --   --   --  1.5* 1.9  --   --   PHOS 3.0  --   --   --   --   --   --   --   --   --   < > = values in this interval not displayed. Liver Function Tests: No results for input(s): AST, ALT, ALKPHOS, BILITOT, PROT, ALBUMIN in the last 168 hours. No results for input(s): LIPASE, AMYLASE in the last 168 hours. No results  for input(s): AMMONIA in the last 168 hours. CBC:  Recent Labs Lab 09/19/15 0539  09/21/15 0500 09/21/15 0835 09/22/15 0300 09/23/15 0920 October 22, 2015 0645  WBC 0.1*  < > <0.1* <0.1* 0.1* <0.1*  0.1*  NEUTROABS 0.0*  --   --   --   --   --   --   HGB 8.4*  < > 7.6* 7.4* 8.9* 7.9* 7.8*  HCT 24.0*  < > 20.8* 20.2* 24.7* 21.4* 21.6*  MCV 87.9  < > 83.9 83.8 82.6 80.1 82.4  PLT 58*  < > 6* <5* 16* <5* 21*  < > = values in this interval not displayed. Cardiac Enzymes: No results for input(s): CKTOTAL, CKMB, CKMBINDEX, TROPONINI in the last 168 hours. BNP (last 3 results)  Recent Labs  06/05/15 2050 08/09/15 0113  BNP 20.5 25.1    ProBNP (last 3 results) No results for input(s): PROBNP in the last 8760 hours.  CBG:  Recent Labs Lab 09/23/15 1153 09/23/15 1705 09/23/15 2213 10-07-2015 0759 2015-10-07 1121  GLUCAP 133* 138* 117* 136* 120*    Recent Results (from the past 240 hour(s))  Culture, blood (routine x 2)     Status: None   Collection Time: 09/29/2015 12:17 PM  Result Value Ref Range Status   Specimen Description PORTA CATH  Final   Special Requests BOTTLES DRAWN AEROBIC AND ANAEROBIC 5CC  Final   Culture   Final    NO GROWTH 5 DAYS Performed at [facility]    Report Status 09/21/2015 FINAL  Final  Culture, blood (routine x 2)     Status: None   Collection Time: 10/03/2015 12:18 PM  Result Value Ref Range Status   Specimen Description RIGHT ANTECUBITAL  Final   Special Requests BOTTLES DRAWN AEROBIC AND ANAEROBIC 5CC  Final   Culture   Final    NO GROWTH 5 DAYS Performed at [facility]    Report Status 09/21/2015 FINAL  Final  Urine culture     Status: None   Collection Time: 09/23/2015  2:08 PM  Result Value Ref Range Status   Specimen Description URINE, CLEAN CATCH  Final   Special Requests NONE  Final   Culture   Final    NO GROWTH 1 DAY Performed at [facility]    Report Status 09/17/2015 FINAL  Final  Culture, blood (Routine X  2) w Reflex to ID Panel     Status: None (Preliminary result)   Collection Time: 09/20/15  4:09 PM  Result Value Ref Range Status   Specimen Description BLOOD RIGHT ARM  Final   Special Requests IN PEDIATRIC BOTTLE 0.5CC  Final   Culture   Final    NO GROWTH 3 DAYS Performed at [facility]    Report Status PENDING  Incomplete  C difficile quick scan w PCR reflex     Status: None   Collection Time: 09/20/15  6:55 PM  Result Value Ref Range Status   C Diff antigen NEGATIVE NEGATIVE Final   C Diff toxin NEGATIVE NEGATIVE Final   C Diff interpretation Negative for toxigenic C. difficile  Final  Gastrointestinal Panel by PCR , Stool     Status: None   Collection Time: 09/20/15  6:55 PM  Result Value Ref Range Status   Campylobacter species NOT DETECTED NOT DETECTED Final   Plesimonas shigelloides NOT DETECTED NOT DETECTED Final   Salmonella species NOT DETECTED NOT DETECTED Final   Yersinia enterocolitica NOT DETECTED NOT DETECTED Final   Vibrio species NOT DETECTED NOT DETECTED Final   Vibrio cholerae NOT DETECTED NOT DETECTED Final   Enteroaggregative E coli (EAEC) NOT DETECTED NOT DETECTED Final   Enteropathogenic E coli (EPEC)  NOT DETECTED NOT DETECTED Final   Enterotoxigenic E coli (ETEC) NOT DETECTED NOT DETECTED Final   Shiga like toxin producing E coli (STEC) NOT DETECTED NOT DETECTED Final   E. coli O157 NOT DETECTED NOT DETECTED Final   Shigella/Enteroinvasive E coli (EIEC) NOT DETECTED NOT DETECTED Final   Cryptosporidium NOT DETECTED NOT DETECTED Final   Cyclospora cayetanensis NOT DETECTED NOT DETECTED Final   Entamoeba histolytica NOT DETECTED NOT DETECTED Final   Giardia lamblia NOT DETECTED NOT DETECTED Final   Adenovirus F40/41 NOT DETECTED NOT DETECTED Final   Astrovirus NOT DETECTED NOT DETECTED Final   Norovirus GI/GII NOT DETECTED NOT DETECTED Final   Rotavirus A NOT DETECTED NOT DETECTED Final   Sapovirus (I, II, IV, and V) NOT DETECTED NOT DETECTED  Final  Culture, blood (single)     Status: None (Preliminary result)   Collection Time: 09/20/15  6:55 PM  Result Value Ref Range Status   Specimen Description BLOOD RIGHT ARM  Final   Special Requests BOTTLES DRAWN AEROBIC AND ANAEROBIC  10CC  Final   Culture   Final    NO GROWTH 3 DAYS Performed at [facility]    Report Status PENDING  Incomplete     Studies: Ct Abdomen Pelvis W Contrast  09/23/2015  CLINICAL DATA:  Vomiting and diarrhea post chemotherapy. Newly diagnosed small cell lung cancer. Also receiving XRT. Prior cholecystectomy and appendectomy. Diarrhea. EXAM: CT ABDOMEN AND PELVIS WITH CONTRAST TECHNIQUE: Multidetector CT imaging of the abdomen and pelvis was performed using the standard protocol following bolus administration of intravenous contrast. CONTRAST:  135m OMNIPAQUE IOHEXOL 300 MG/ML  SOLN COMPARISON:  PET-CT dated 07/12/2015. CT abdomen pelvis dated 06/17/2015. FINDINGS: Lower chest: Prior right lower lobe nodular opacity has essentially resolved. Minimal patchy opacity anteriorly in the right lower lobe (series 5/image 5), possibly reflecting early/ developing radiation changes. Hepatobiliary: Liver is within normal limits. No suspicious/enhancing hepatic lesions. Status post cholecystectomy. Mild periportal edema. No intrahepatic or extrahepatic ductal dilatation. Pancreas: Within normal limits. Spleen: Within normal limits. Adrenals/Urinary Tract: Adrenal glands are within normal limits. Kidneys are within normal limits.  No hydronephrosis. Bladder is within normal limits. Stomach/Bowel: Mild diffuse wall thickening with fluid along the distal esophagus (series 2/ image 2), raising the possibility of distal esophagitis. Moderately distended stomach. Mildly distended proximal small bowel. Mid/distal small bowel, including long segments of jejunum/ ileum, are notable for diffuse wall thickening (for example, series 2/ image 75), extending to the terminal ileum  (series 2/image 57). Colon is relatively decompressed. Overall appearance is worrisome for infectious/inflammatory enteritis, possibly secondary to chemotherapy, with associated small bowel ileus. Vascular/Lymphatic: Atherosclerotic calcifications of the abdominal aorta and branch vessels. No evidence of abdominal aortic aneurysm. No suspicious abdominopelvic lymphadenopathy. Reproductive: Uterus is within normal limits. Bilateral ovaries are within normal limits. Other: Small volume perihepatic and pelvic ascites. Musculoskeletal: Degenerative changes of the visualized thoracolumbar spine. IMPRESSION: Suspected infectious/ inflammatory enteritis involving jejunum and ileum, possibly secondary to chemotherapy. Dilated loops of proximal small bowel is favored to reflect secondary small bowel ileus. Wall thickening involving the distal esophagus, correlate for radiation induced esophagitis. Small volume abdominopelvic ascites. Prior right lower lobe pulmonary nodule has essentially resolved. New patchy opacity along the anterior right lower lobe may reflect early/ developing radiation changes. Electronically Signed   By: SJulian HyM.D.   On: 09/23/2015 15:03   Dg Chest Port 1 View  2Feb 15, 2017 CLINICAL DATA:  Dyspnea EXAM: PORTABLE CHEST 1 VIEW COMPARISON:  09/27/2015  FINDINGS: Mild patchy bilateral lower lobe opacities, suspicious for pneumonia. No frank interstitial edema. No pleural effusion or pneumothorax. Cardiomegaly. Right chest port terminates in the upper right atrium. IMPRESSION: Mild patchy bilateral lower lobe opacities, suspicious for pneumonia. Electronically Signed   By: Julian Hy M.D.   On: 18-Oct-2015 10:55   Dg Abd Portable 1v  10/18/2015  CLINICAL DATA:  Abdominal distention. EXAM: PORTABLE ABDOMEN - 1 VIEW COMPARISON:  Abdominal CT from yesterday FINDINGS: Unchanged distension of colonic and small bowel loops. The transverse colon measures up to 6 cm in diameter. There is  associated fold thickening seen at the level of the transverse colonic haustra and left lower quadrant small bowel loops. No concerning intra-abdominal mass effect. Cholecystectomy clips. IMPRESSION: Unchanged small bowel/colonic distention with fold thickening compared to yesterday's CT. Electronically Signed   By: Monte Fantasia M.D.   On: 2015-10-18 10:50    Scheduled Meds:  Scheduled Meds: . antiseptic oral rinse  7 mL Mouth Rinse BID  . budesonide-formoterol  2 puff Inhalation BID  . calcium-vitamin D  1 tablet Oral BID  . ceFEPime (MAXIPIME) IV  2 g Intravenous Q8H  . cholecalciferol  1,000 Units Oral Daily  . gabapentin  600 mg Oral TID  . insulin aspart  0-9 Units Subcutaneous TID WC  . insulin glargine  5 Units Subcutaneous QHS  . lamoTRIgine  125 mg Oral BID  . ondansetron (ZOFRAN) IV  4 mg Intravenous Q6H  . pantoprazole (PROTONIX) IV  40 mg Intravenous Q12H  . QUEtiapine  800 mg Oral QHS  . simvastatin  40 mg Oral q1800  . sodium chloride flush  10-40 mL Intracatheter Q12H  . sucralfate  1 g Oral TID WC & HS  . tiotropium  18 mcg Inhalation Daily  . vancomycin  1,250 mg Intravenous Q8H   Continuous Infusions: . 0.9 % NaCl with KCl 20 mEq / L 100 mL/hr at 09/23/15 1257    Time spent on care of this patient: 64 min   [place], MD October 18, 2015, 1:04 PM  LOS: 5 days   [facility] Office  [phone] Pager - Text Page per [email or web address] If 7PM-7AM, please contact night-coverage [email or web address]

## 2015-10-10 NOTE — Clinical Social Work Placement (Signed)
Patient has a bed at [facility]. CSW has completed FL2 & will continue to follow and assist with discharge when ready.    Raynaldo Opitz, [facility] Clinical Social Worker cell #: [phone]     CLINICAL SOCIAL WORK PLACEMENT  NOTE  Date:  09-25-15  Patient Details  Name: Sarah Leonard MRN: [id number] Date of Birth: [birth year]  Clinical Social Work is seeking post-discharge placement for this patient at the [place] level of care (*CSW will initial, date and re-position this form in  chart as items are completed):  Yes   Patient/family provided with [place] Work Department's list of facilities offering this level of care within the geographic area requested by the patient (or if unable, by the patient's family).  Yes   Patient/family informed of their freedom to choose among providers that offer the needed level of care, that participate in Medicare, Medicaid or managed care program needed by the patient, have an available bed and are willing to accept the patient.  Yes   Patient/family informed of [facility]'s ownership interest in [facility] and [facility], as well as of the fact that they are under no obligation to receive care at these facilities.  PASRR submitted to EDS on 2015-09-25     PASRR number received on 25-Sep-2015     Existing PASRR number confirmed on       FL2 transmitted to all facilities in geographic area requested by pt/family on 25-Sep-2015     FL2 transmitted to all facilities within larger geographic area on       Patient informed that his/her managed care company has contracts with or will negotiate with certain facilities, including the following:        Yes   Patient/family informed of bed offers received.  Patient chooses bed at [facility]     Physician recommends and patient chooses bed at      Patient to be transferred to [facility] on  .  Patient to be transferred to facility by       Patient family notified on   of transfer.  Name of family member notified:        PHYSICIAN       Additional Comment:    _______________________________________________ Standley Brooking, LCSW 09-25-15, 9:29 AM

## 2015-10-10 DEATH — deceased

## 2015-10-16 ENCOUNTER — Other Ambulatory Visit: Payer: Self-pay

## 2015-10-19 NOTE — Progress Notes (Signed)
  Radiation Oncology         [phone] ________________________________  Name: Sarah Leonard MRN: [id number]  Date: 09/28/2015  DOB: September 16, 1960  End of Treatment Note  Diagnosis:   Lung cancer     Indication for treatment::  curative       Radiation treatment dates:   08/08/15 - 09/13/15  Site/dose:   The patient was planned to receive 59.4 Gy in 33 fractions using a 4 field 3D conformal technique. Concurrent chemotherapy was given.  Narrative: The patient was able to tolerate her treatment satisfactorily, but was admitted with a fever and nausea. Unfortunately, she died during her hospitalization from what appeared to be aspiration.  ________________________________  Jodelle Gross, M.D., Ph.D.

## 2015-10-23 ENCOUNTER — Ambulatory Visit: Payer: Self-pay | Admitting: Internal Medicine

## 2015-10-23 ENCOUNTER — Other Ambulatory Visit: Payer: Self-pay

## 2015-10-23 ENCOUNTER — Ambulatory Visit: Payer: Self-pay

## 2015-10-23 ENCOUNTER — Encounter: Payer: Self-pay | Admitting: Nutrition

## 2015-10-24 ENCOUNTER — Ambulatory Visit: Payer: Self-pay

## 2015-10-25 ENCOUNTER — Ambulatory Visit: Payer: Self-pay

## 2015-10-27 ENCOUNTER — Ambulatory Visit: Payer: Self-pay

## 2015-10-30 ENCOUNTER — Other Ambulatory Visit: Payer: Self-pay

## 2016-01-28 ENCOUNTER — Other Ambulatory Visit: Payer: Self-pay | Admitting: Nurse Practitioner

## 2016-01-29 ENCOUNTER — Other Ambulatory Visit: Payer: Self-pay | Admitting: Nurse Practitioner

## 2016-02-17 ENCOUNTER — Other Ambulatory Visit: Payer: Self-pay | Admitting: Oncology

## 2017-09-12 IMAGING — CT CT CHEST W/ CM
2 of 4 series · 15 of 36 positions shown, 18 images · IV contrast (omnipaque)
Comparison: 11/25/2014

CLINICAL DATA: Evaluate mass seen on abdominal CT

EXAM:
CT CHEST WITH CONTRAST
TECHNIQUE: Multidetector CT imaging of the chest was performed during
intravenous contrast administration.
CONTRAST:  75mL OMNIPAQUE IOHEXOL 300 MG/ML  SOLN

[Series 2: thorax 5.0 i31f 1 · axial · 0.66mm/px · z∈[-256,-26]mm · 12 of 56 slices shown, 15 images]
[im 5/56  mediastinal]
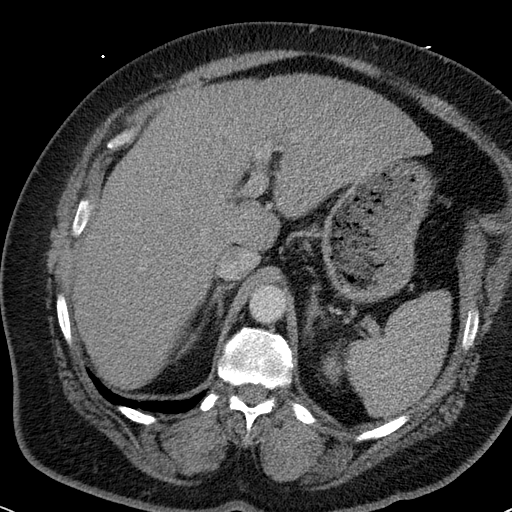
[im 5/56  lung]
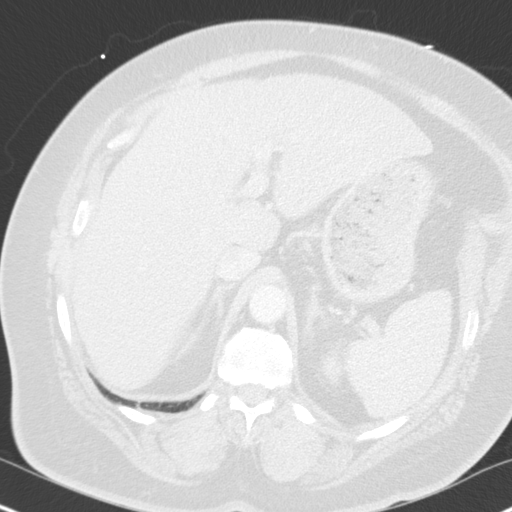
[im 9/56  lung]
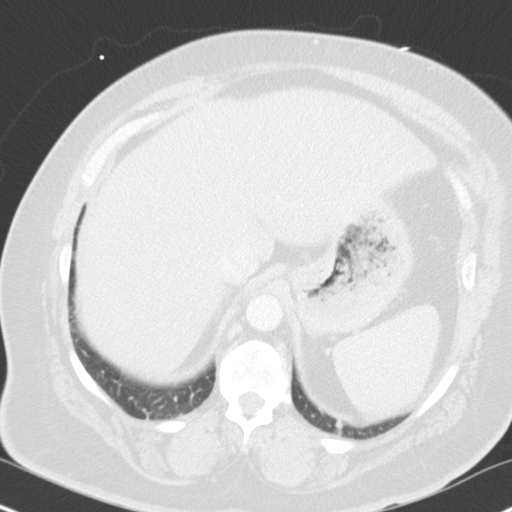
[im 13/56  lung]
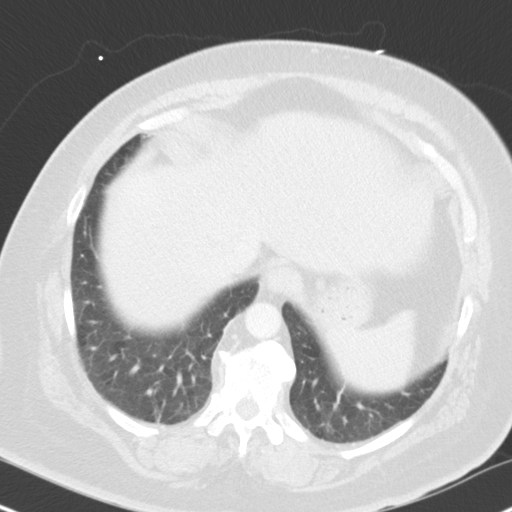
[im 17/56  lung]
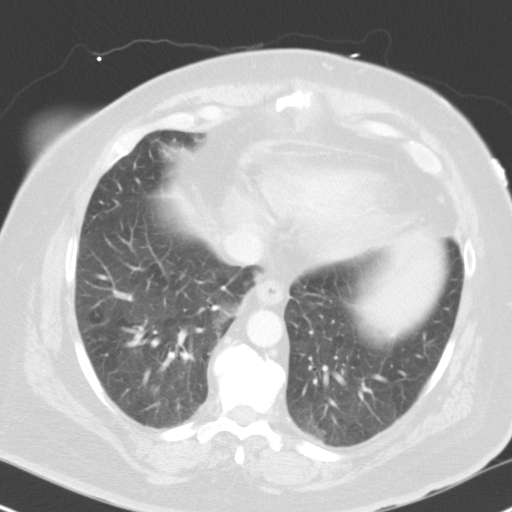
[im 22/56  mediastinal]
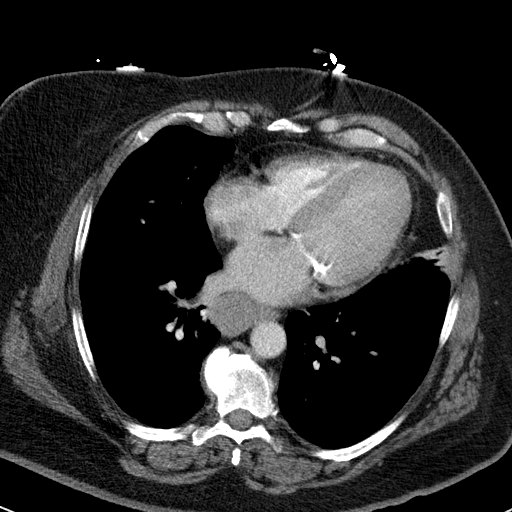
[im 22/56  lung]
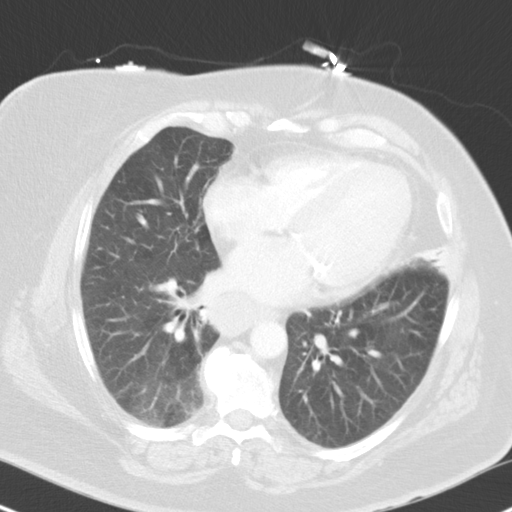
[im 26/56  lung]
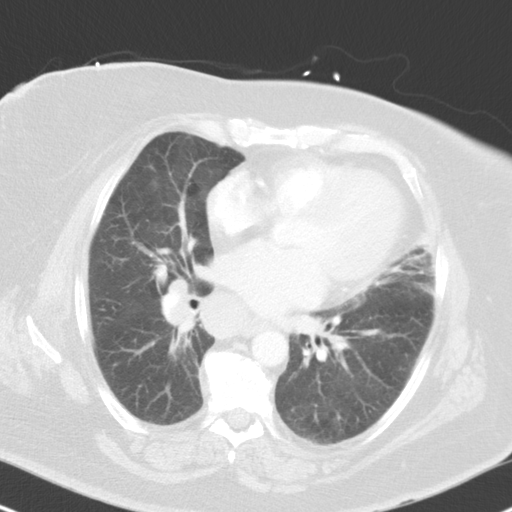
[im 30/56  lung]
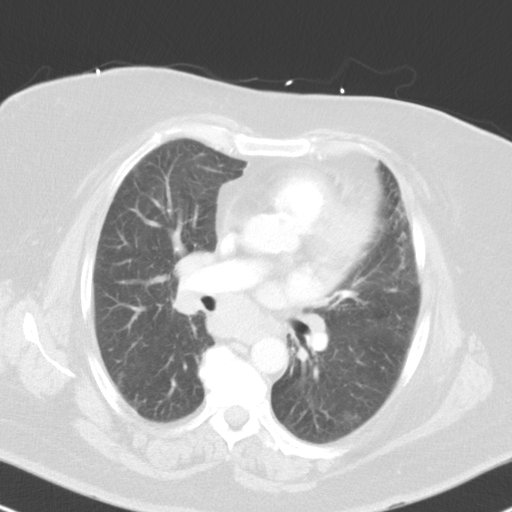
[im 34/56  lung]
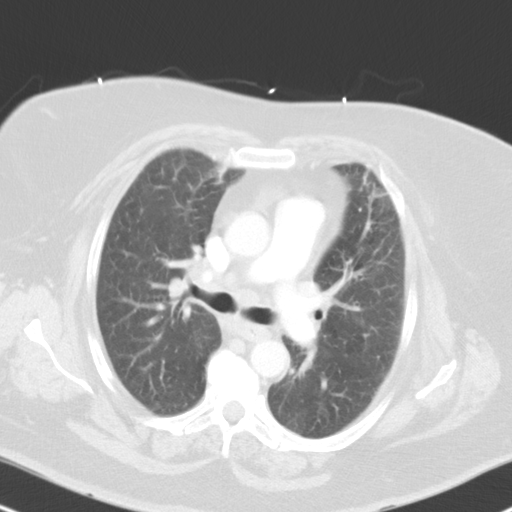
[im 39/56  mediastinal]
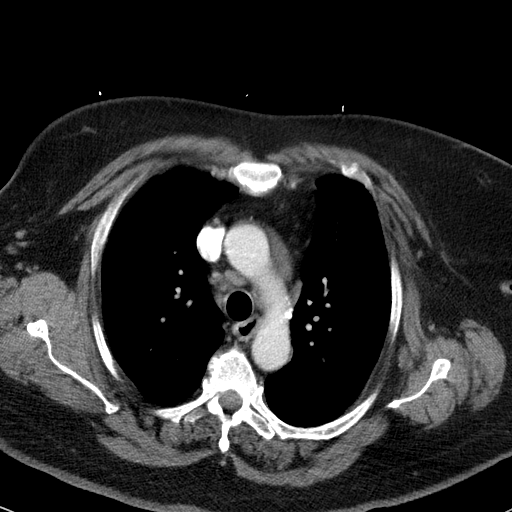
[im 39/56  lung]
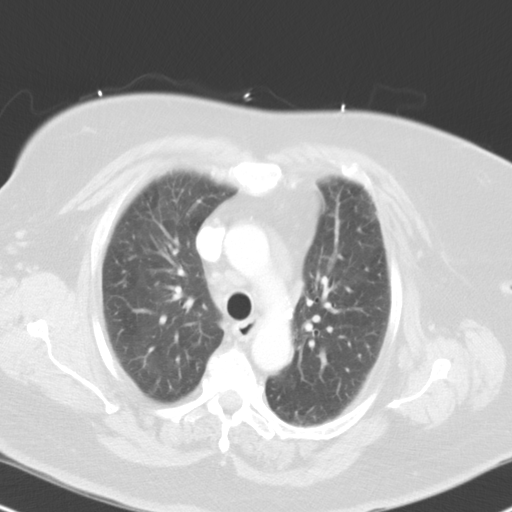
[im 43/56  lung]
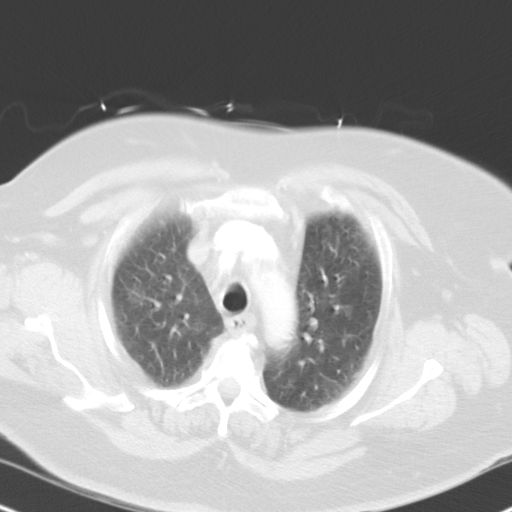
[im 47/56  lung]
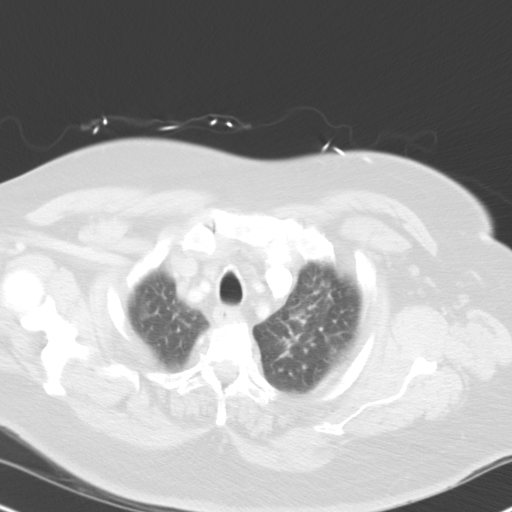
[im 51/56  lung]
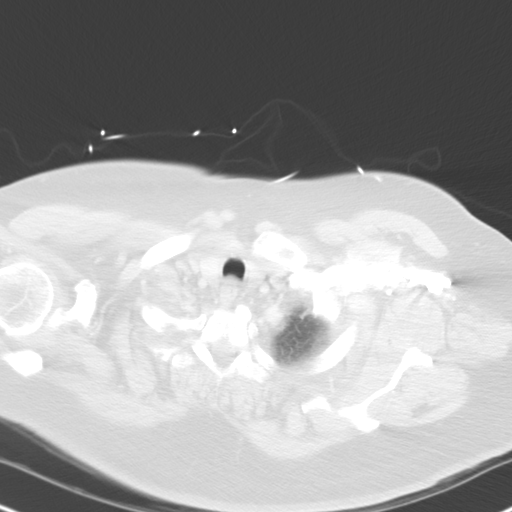

[Series 5: coronal · coronal · 0.56mm/px · 3 of 82 slices shown]
[im 17/82  lung]
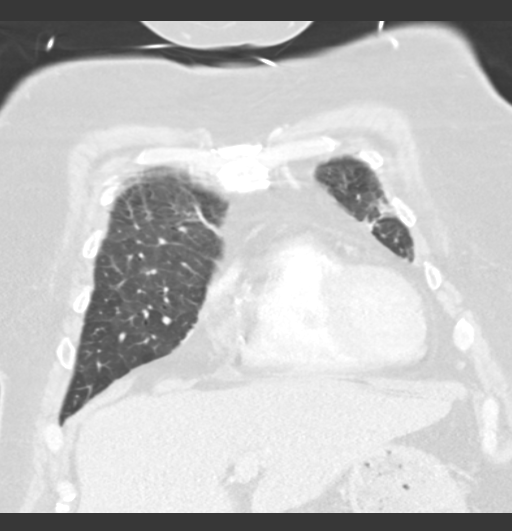
[im 33/82  lung]
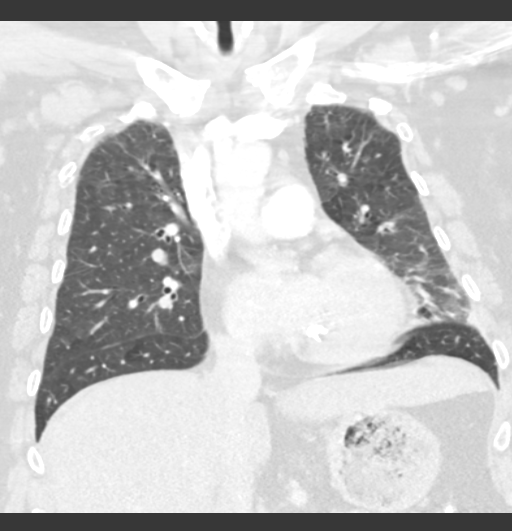
[im 49/82  lung]
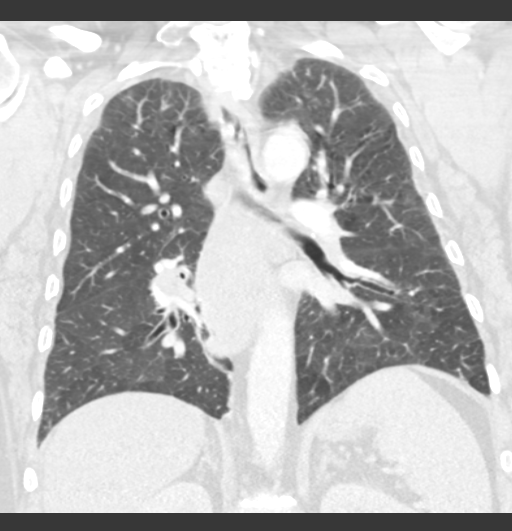

[15 of 36 positions shown; findings below may reference images not displayed]

FINDINGS: THORACIC INLET/BODY WALL:

No axillary or thoracic inlet adenopathy.

MEDIASTINUM:

Mild cardiomegaly. No pericardial effusion. Atherosclerosis,
including the coronary arteries. No acute vascular finding.

Mediastinal and hilar lymphadenopathy with progressive enlargement
since prior, especially of the subcarinal and right periesophageal
nodes. For example, the subcarinal node now measures 3 cm short axis
on axial imaging.

LUNG WINDOWS:

New 16 mm (measured craniocaudal) nodule in the right lower lobe
with probable satellite 7 mm nodule.

Much of the previously seen lung opacity has resolved, with residual
patchy ground-glass density in the apical lungs favoring scar.
Linear scarring present in the lingula. Moderate patchy air
trapping.

UPPER ABDOMEN:

No acute findings.

OSSEOUS:

No acute fracture.  No suspicious lytic or blastic lesions.
IMPRESSION: Progressive mediastinal and hilar adenopathy with new 16 mm right
lower lobe pulmonary nodule. Lymphoma or bronchogenic carcinoma is
the primary diagnostic concern and histologic correlation is
recommended, preferably guided by PET-CT.

## 2017-11-02 IMAGING — CR DG NECK SOFT TISSUE
2 series · 2 of 2 positions shown · non-contrast
Comparison: None.

CLINICAL DATA: Globus sensation for 2 days. History of lung cancer.

EXAM:
NECK SOFT TISSUES - 1+ VIEW

[w soft tissue neck lat]
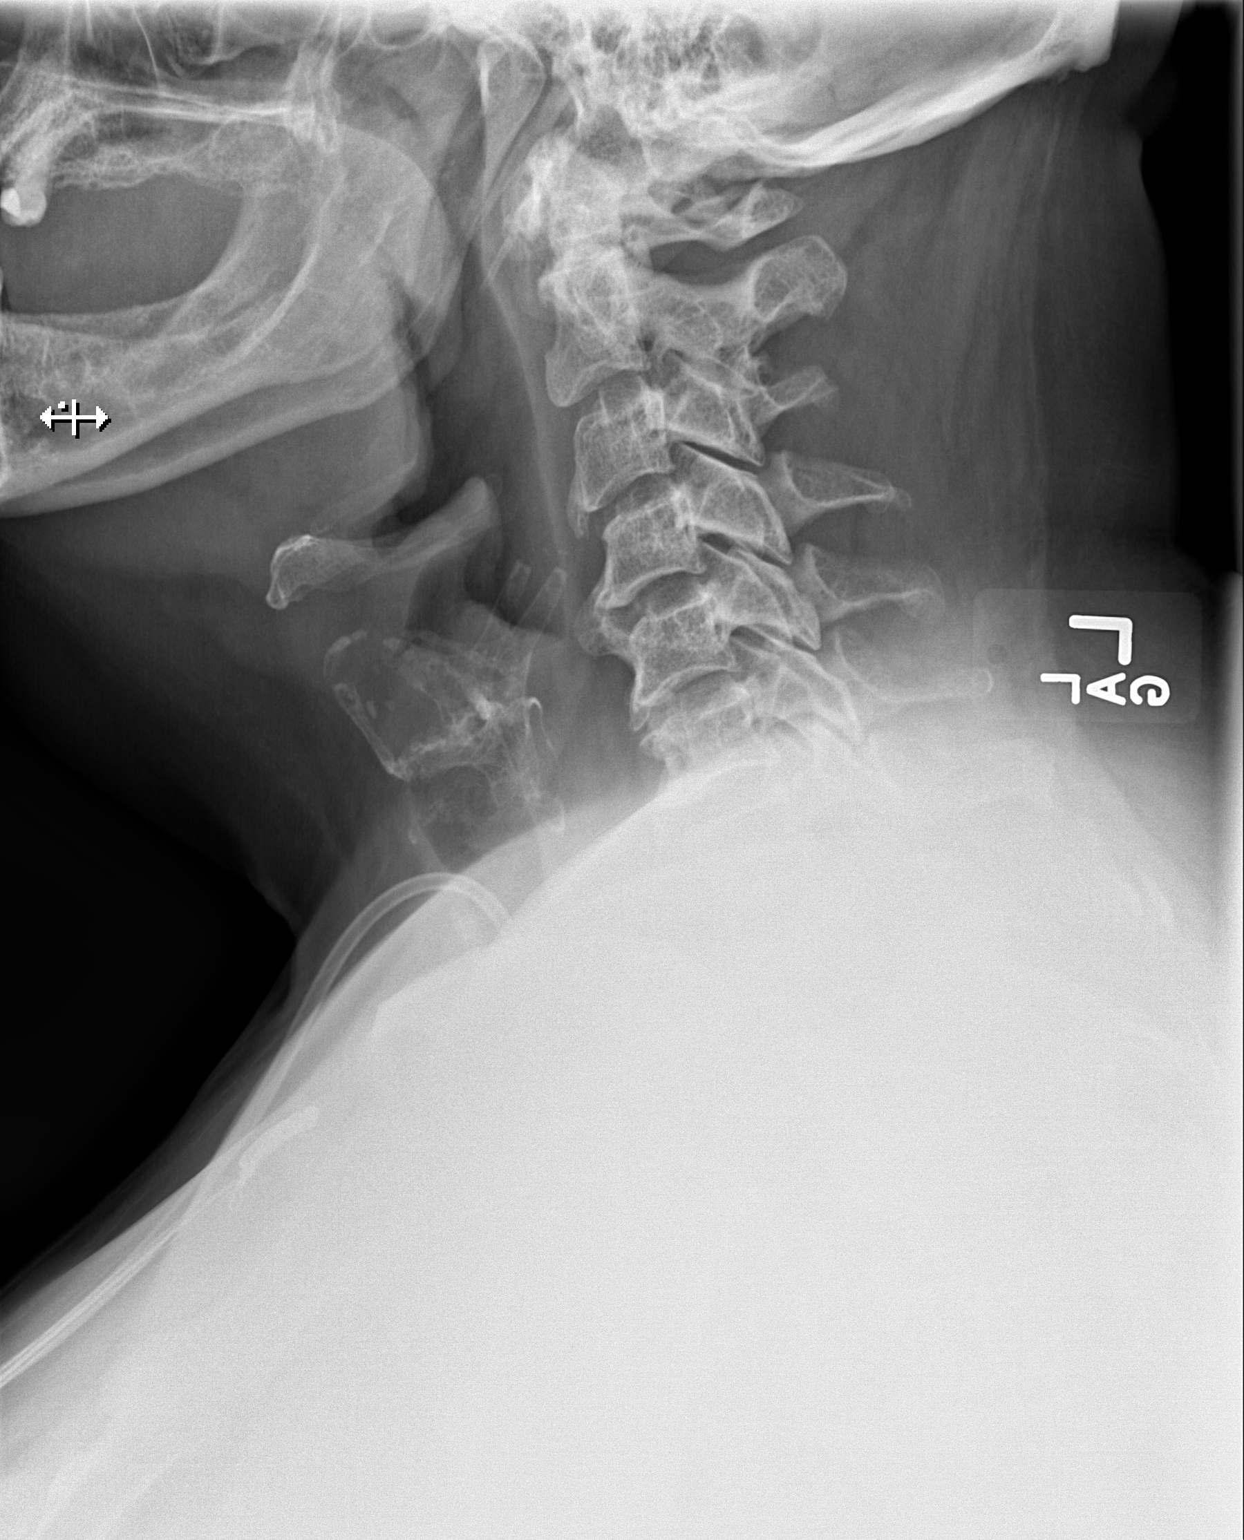

[w soft tissue neck ap]
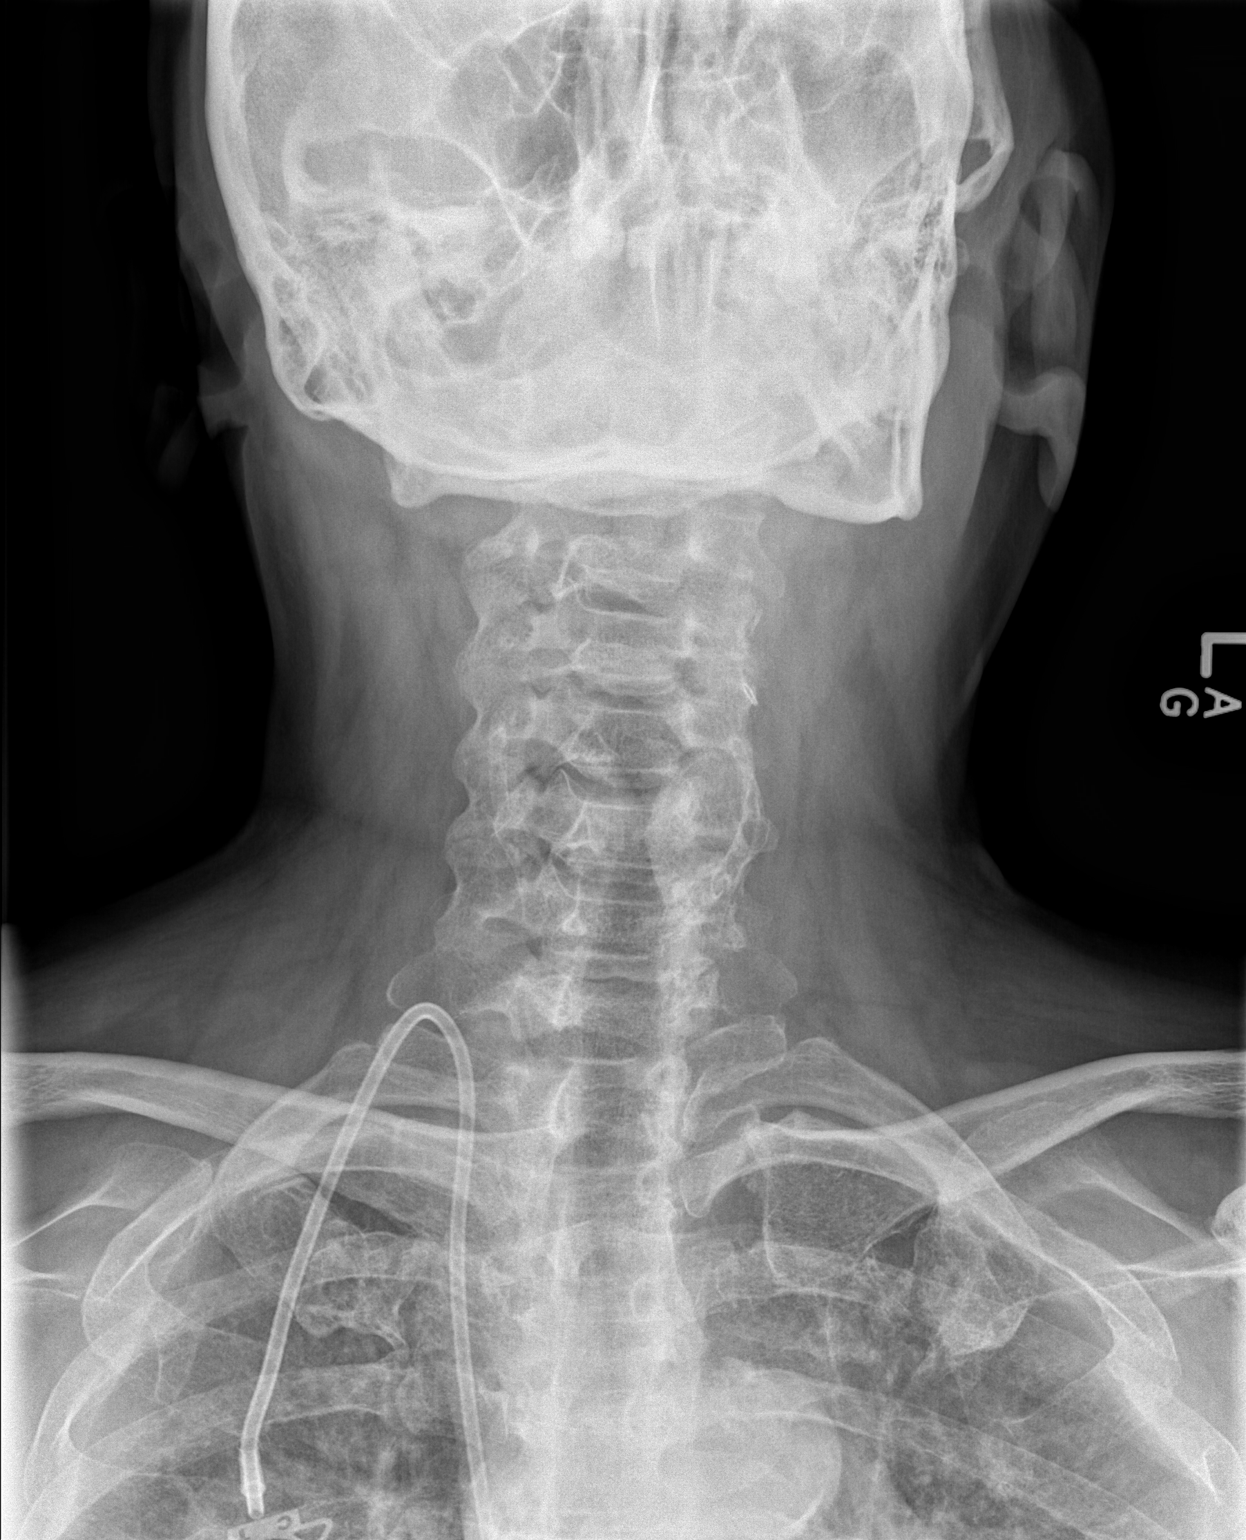

[2 of 2 positions shown; findings below may reference images not displayed]

FINDINGS: There is no evidence of retropharyngeal soft tissue swelling or
epiglottic enlargement. The cervical airway is unremarkable and no
radio-opaque foreign body identified. RIGHT tunneled Port-A-Cath via
internal jugular venous approach. Moderate degenerative change of
the cervical spine without destructive bony lesions.
IMPRESSION: Negative.

## 2017-11-02 IMAGING — CR DG CHEST 2V
2 series · 2 of 2 positions shown · non-contrast
Comparison: Chest radiograph dated 06/21/2015

CLINICAL DATA: 54-year-old female presenting with shortness of
breath and filling like something is stuck in her throat

EXAM:
CHEST  2 VIEW

[w chest lat]
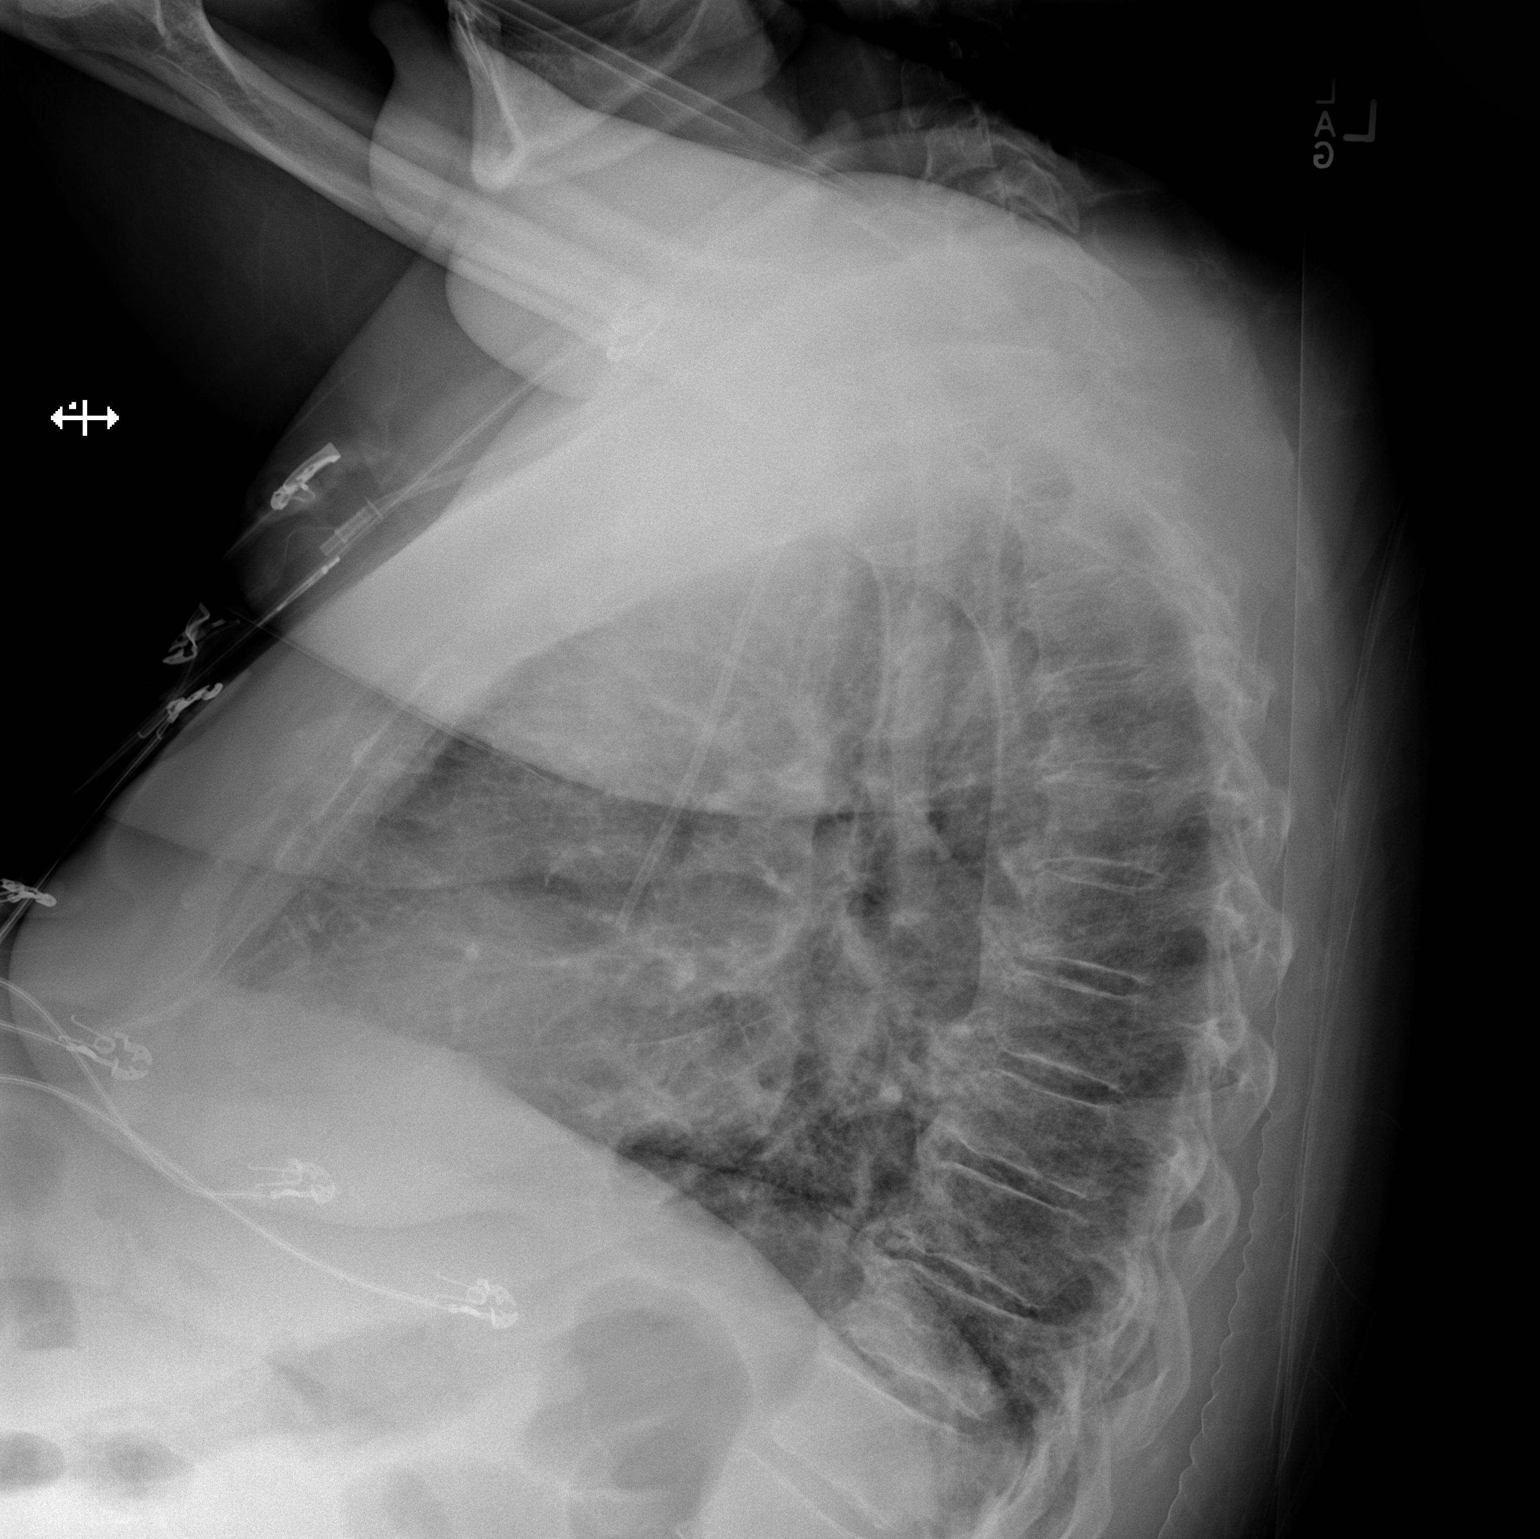

[x chest ap]
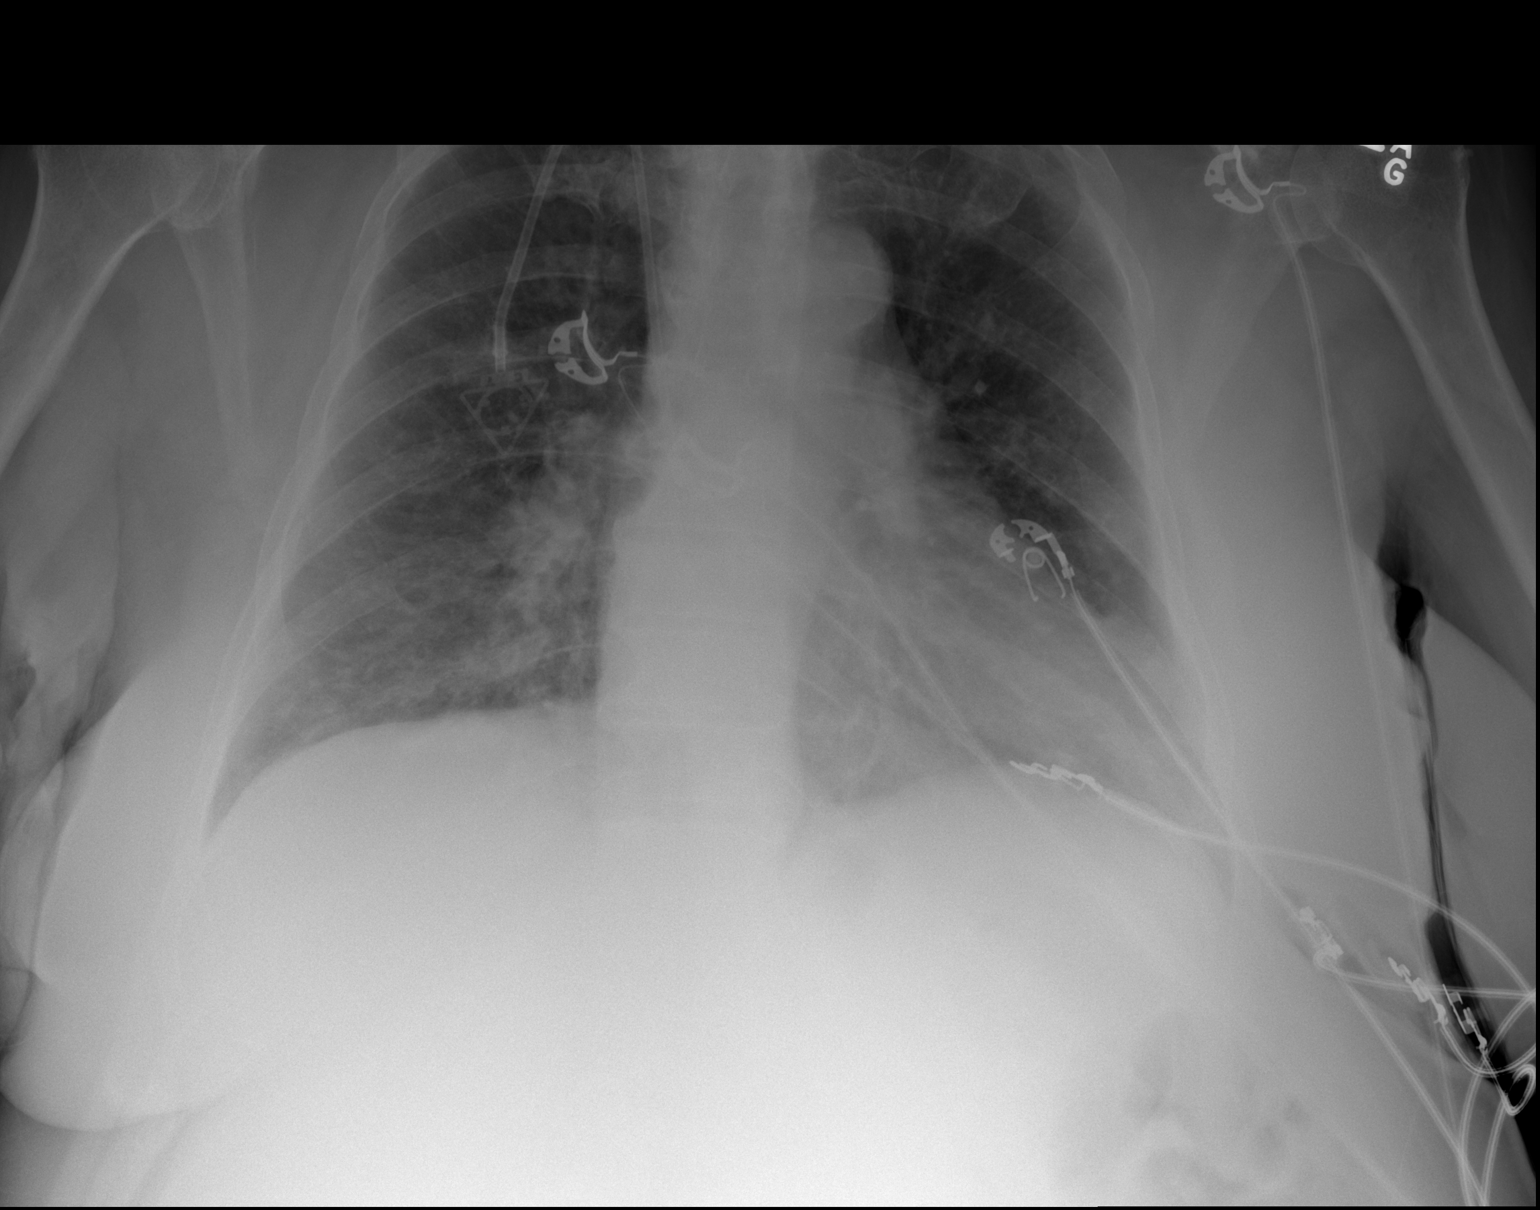

[2 of 2 positions shown; findings below may reference images not displayed]

FINDINGS: Right pectoral Port-A-Cath with tip at the cavoatrial junction.
There are bibasilar atelectatic changes/ scarring. There is no focal
consolidation, pleural effusion, or pneumothorax. Stable cardiac
silhouette. The osseous structures are grossly unremarkable.
IMPRESSION: No focal consolidation.
